# Patient Record
Sex: Male | Born: 1960 | Race: White | Hispanic: No | Marital: Married | Smoking: Never smoker
Health system: Southern US, Community
[De-identification: ages and names within clinical notes are randomized; demographics above are authoritative.]

## PROBLEM LIST (undated history)

## (undated) DIAGNOSIS — E66811 Obesity, class 1: Secondary | ICD-10-CM

## (undated) DIAGNOSIS — E6609 Other obesity due to excess calories: Secondary | ICD-10-CM

## (undated) DIAGNOSIS — I10 Essential (primary) hypertension: Secondary | ICD-10-CM

## (undated) DIAGNOSIS — M4640 Discitis, unspecified, site unspecified: Secondary | ICD-10-CM

---

## 2014-04-23 ENCOUNTER — Other Ambulatory Visit (HOSPITAL_COMMUNITY): Payer: Self-pay | Admitting: Radiology

## 2014-04-23 DIAGNOSIS — G4730 Sleep apnea, unspecified: Secondary | ICD-10-CM

## 2014-04-25 ENCOUNTER — Ambulatory Visit: Payer: BLUE CROSS/BLUE SHIELD | Attending: Family Medicine | Admitting: Sleep Medicine

## 2014-04-25 DIAGNOSIS — G4733 Obstructive sleep apnea (adult) (pediatric): Secondary | ICD-10-CM | POA: Diagnosis not present

## 2014-04-25 DIAGNOSIS — R0683 Snoring: Secondary | ICD-10-CM | POA: Diagnosis present

## 2014-04-25 DIAGNOSIS — Z6841 Body Mass Index (BMI) 40.0 and over, adult: Secondary | ICD-10-CM | POA: Insufficient documentation

## 2014-04-25 DIAGNOSIS — E669 Obesity, unspecified: Secondary | ICD-10-CM | POA: Insufficient documentation

## 2014-04-25 DIAGNOSIS — G4730 Sleep apnea, unspecified: Secondary | ICD-10-CM

## 2014-04-30 NOTE — Sleep Study (Signed)
  [facility] Odella Appelhans A. Gerilyn Pilgrimoonquah, MD     [email or web address]        NOCTURNAL POLYSOMNOGRAM    LOCATION: SLEEP LAB FACILITY: [place]   PHYSICIAN: Jerrye Seebeck A. Gerilyn Pilgrimoonquah, M.D.   DATE OF STUDY: 04/21/2014.   REFERRING PHYSICIAN: P Sasser.   INDICATIONS: The patient is a 54 year old presents with snoring and obesity.  MEDICATIONS:  Prior to Admission medications   Not on File      EPWORTH SLEEPINESS SCALE: 0.   BMI: 41.   ARCHITECTURAL SUMMARY: Total recording time was 375 minutes. Sleep efficiency 45 %. Sleep latency 9 minutes. REM latency 247 minutes. Stage NI 34 %, N2 58 % and N3 0 % and REM sleep 7 %.    RESPIRATORY DATA:  Baseline oxygen saturation is 96 %. The lowest saturation is 80 %. The diagnostic AHI is 53. Positive pressure treatment was attempted but the patient categorically refused.  LIMB MOVEMENT SUMMARY: PLM index 0.   ELECTROCARDIOGRAM SUMMARY: Average heart rate is 84 with no significant dysrhythmias observed.   IMPRESSION:  1. Severe obstructive sleep apnea syndrome. The patient refused positive pressure treatment. Formal titration recording is suggested however once the patient has been counseled.  Thanks for this referral.  Raenette Sakata A. Gerilyn Pilgrimoonquah, M.D. Diplomat, Biomedical engineerAmerican Board of Sleep Medicine.

## 2021-05-08 ENCOUNTER — Emergency Department (HOSPITAL_COMMUNITY)
Admission: EM | Admit: 2021-05-08 | Discharge: 2021-05-08 | Disposition: A | Discharge status: Home / Self Care | Payer: BC Managed Care – PPO | Attending: Emergency Medicine | Admitting: Emergency Medicine

## 2021-05-08 ENCOUNTER — Emergency Department (HOSPITAL_COMMUNITY): Payer: BC Managed Care – PPO

## 2021-05-08 ENCOUNTER — Encounter (HOSPITAL_COMMUNITY): Payer: Self-pay | Admitting: *Deleted

## 2021-05-08 DIAGNOSIS — M545 Low back pain, unspecified: Secondary | ICD-10-CM | POA: Insufficient documentation

## 2021-05-08 DIAGNOSIS — K8020 Calculus of gallbladder without cholecystitis without obstruction: Secondary | ICD-10-CM | POA: Insufficient documentation

## 2021-05-08 DIAGNOSIS — Z7982 Long term (current) use of aspirin: Secondary | ICD-10-CM | POA: Diagnosis not present

## 2021-05-08 DIAGNOSIS — Z20822 Contact with and (suspected) exposure to covid-19: Secondary | ICD-10-CM | POA: Diagnosis not present

## 2021-05-08 DIAGNOSIS — E119 Type 2 diabetes mellitus without complications: Secondary | ICD-10-CM | POA: Diagnosis not present

## 2021-05-08 DIAGNOSIS — E860 Dehydration: Secondary | ICD-10-CM | POA: Insufficient documentation

## 2021-05-08 DIAGNOSIS — R0602 Shortness of breath: Secondary | ICD-10-CM | POA: Insufficient documentation

## 2021-05-08 DIAGNOSIS — Z79899 Other long term (current) drug therapy: Secondary | ICD-10-CM | POA: Diagnosis not present

## 2021-05-08 DIAGNOSIS — I10 Essential (primary) hypertension: Secondary | ICD-10-CM | POA: Insufficient documentation

## 2021-05-08 DIAGNOSIS — M549 Dorsalgia, unspecified: Secondary | ICD-10-CM | POA: Diagnosis present

## 2021-05-08 LAB — URINALYSIS, ROUTINE W REFLEX MICROSCOPIC
Bilirubin Urine: NEGATIVE
Glucose, UA: NEGATIVE mg/dL
Ketones, ur: 5 mg/dL — AB
Leukocytes,Ua: NEGATIVE
Nitrite: NEGATIVE
Protein, ur: 30 mg/dL — AB
Specific Gravity, Urine: 1.024 (ref 1.005–1.030)
pH: 5 (ref 5.0–8.0)

## 2021-05-08 LAB — CBC WITH DIFFERENTIAL/PLATELET
Abs Immature Granulocytes: 0.07 10*3/uL (ref 0.00–0.07)
Basophils Absolute: 0.1 10*3/uL (ref 0.0–0.1)
Basophils Relative: 1 %
Eosinophils Absolute: 0.1 10*3/uL (ref 0.0–0.5)
Eosinophils Relative: 1 %
HCT: 47.6 % (ref 39.0–52.0)
Hemoglobin: 15.5 g/dL (ref 13.0–17.0)
Immature Granulocytes: 1 %
Lymphocytes Relative: 15 %
Lymphs Abs: 1.5 10*3/uL (ref 0.7–4.0)
MCH: 29.8 pg (ref 26.0–34.0)
MCHC: 32.6 g/dL (ref 30.0–36.0)
MCV: 91.4 fL (ref 80.0–100.0)
Monocytes Absolute: 1.4 10*3/uL — ABNORMAL HIGH (ref 0.1–1.0)
Monocytes Relative: 13 %
Neutro Abs: 7.2 10*3/uL (ref 1.7–7.7)
Neutrophils Relative %: 69 %
Platelets: 231 10*3/uL (ref 150–400)
RBC: 5.21 MIL/uL (ref 4.22–5.81)
RDW: 13.8 % (ref 11.5–15.5)
WBC: 10.3 10*3/uL (ref 4.0–10.5)
nRBC: 0 % (ref 0.0–0.2)

## 2021-05-08 LAB — COMPREHENSIVE METABOLIC PANEL
ALT: 39 U/L (ref 0–44)
AST: 29 U/L (ref 15–41)
Albumin: 3.3 g/dL — ABNORMAL LOW (ref 3.5–5.0)
Alkaline Phosphatase: 75 U/L (ref 38–126)
Anion gap: 9 (ref 5–15)
BUN: 14 mg/dL (ref 6–20)
CO2: 22 mmol/L (ref 22–32)
Calcium: 8.5 mg/dL — ABNORMAL LOW (ref 8.9–10.3)
Chloride: 103 mmol/L (ref 98–111)
Creatinine, Ser: 1.18 mg/dL (ref 0.61–1.24)
GFR, Estimated: >60 mL/min (ref >60)
Glucose, Bld: 114 mg/dL — ABNORMAL HIGH (ref 70–99)
Potassium: 3.8 mmol/L (ref 3.5–5.1)
Sodium: 134 mmol/L — ABNORMAL LOW (ref 135–145)
Total Bilirubin: 0.7 mg/dL (ref 0.3–1.2)
Total Protein: 7.5 g/dL (ref 6.5–8.1)

## 2021-05-08 LAB — RESP PANEL BY RT-PCR (FLU A&B, COVID) ARPGX2
Influenza A by PCR: NEGATIVE
Influenza B by PCR: NEGATIVE
SARS Coronavirus 2 by RT PCR: NEGATIVE

## 2021-05-08 LAB — LIPASE, BLOOD: Lipase: 39 U/L (ref 11–51)

## 2021-05-08 IMAGING — CT CT RENAL STONE PROTOCOL
2 of 4 series · 16 of 46 positions shown, 18 images · non-contrast
Comparison: By report from [DATE]

CLINICAL DATA: Right-sided flank pain for several days, initial
encounter



[Series 2: axial st · axial · 0.98mm/px · z∈[-970,-490]mm · 13 of 112 slices shown, 15 images]
[im 8/112  soft-tissue]
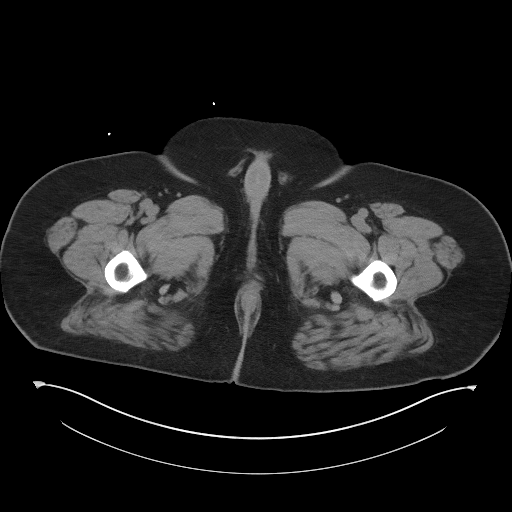
[im 8/112  bone]
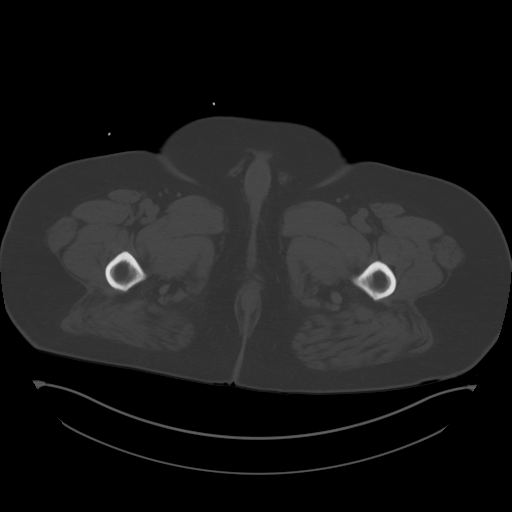
[im 15/112  soft-tissue]
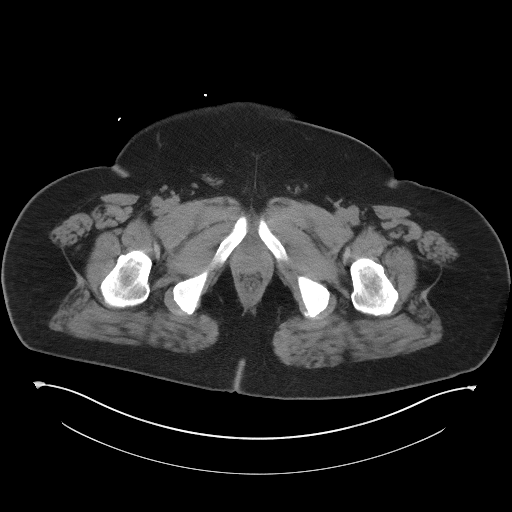
[im 23/112  soft-tissue]
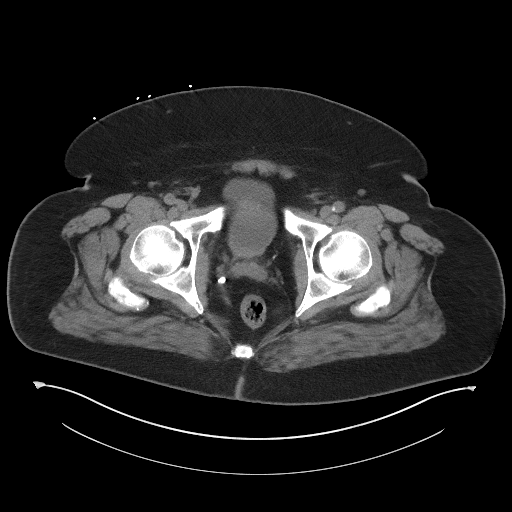
[im 30/112  soft-tissue]
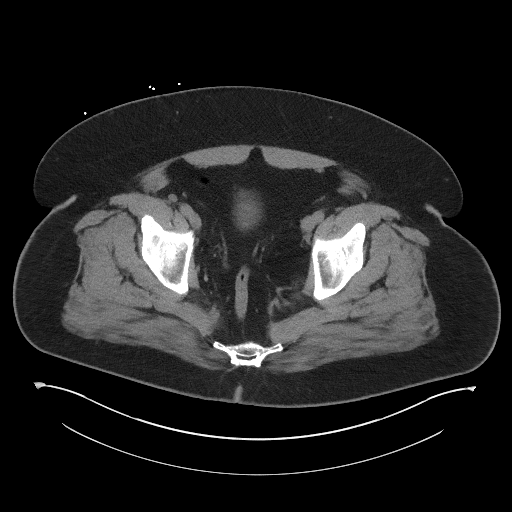
[im 38/112  soft-tissue]
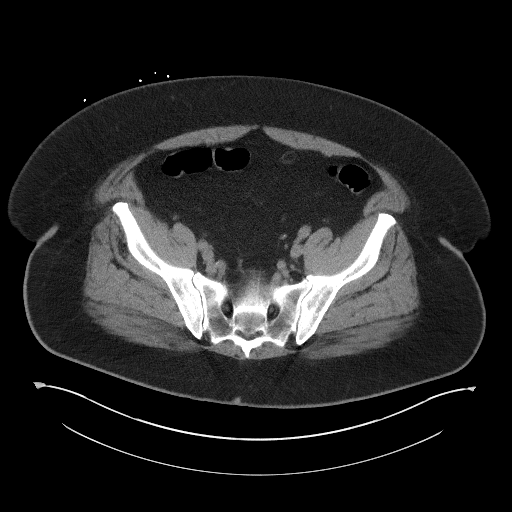
[im 45/112  soft-tissue]
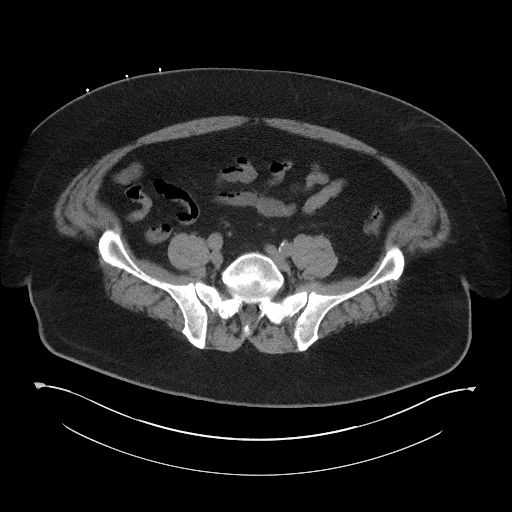
[im 60/112  soft-tissue]
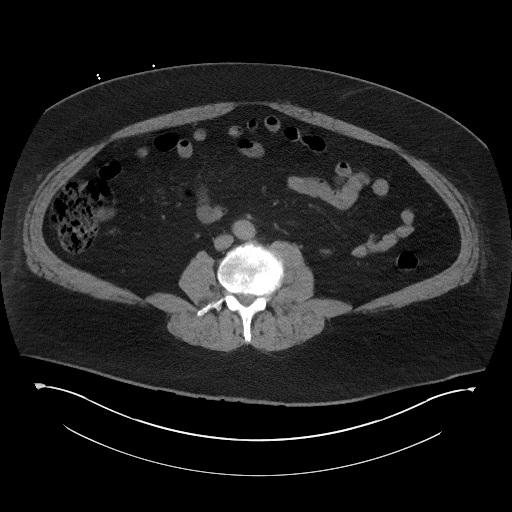
[im 67/112  soft-tissue]
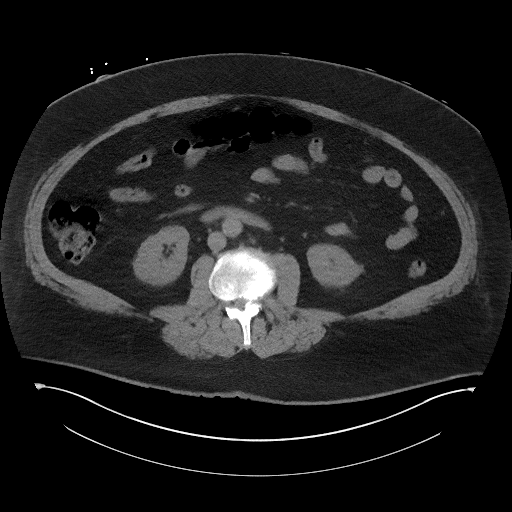
[im 75/112  soft-tissue]
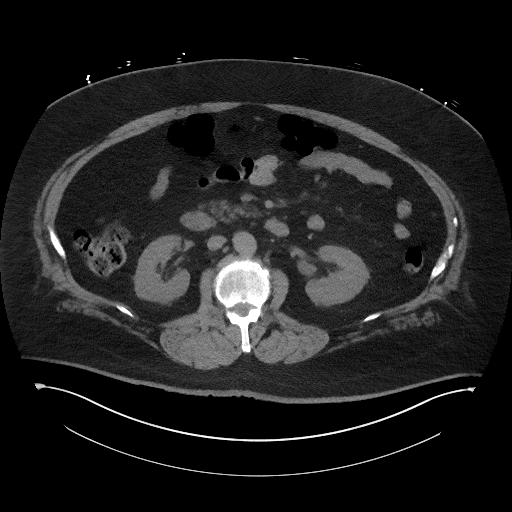
[im 75/112  bone]
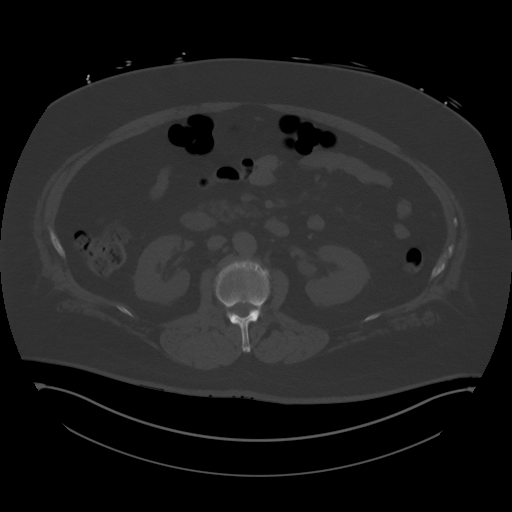
[im 82/112  soft-tissue]
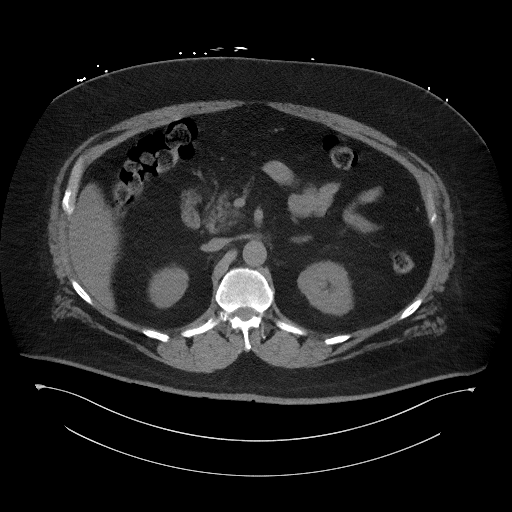
[im 89/112  soft-tissue]
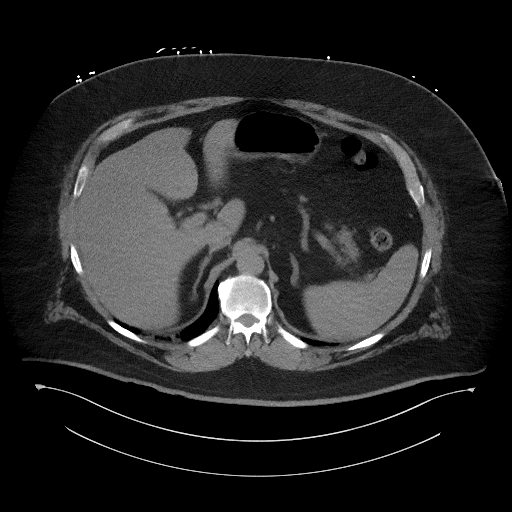
[im 97/112  soft-tissue]
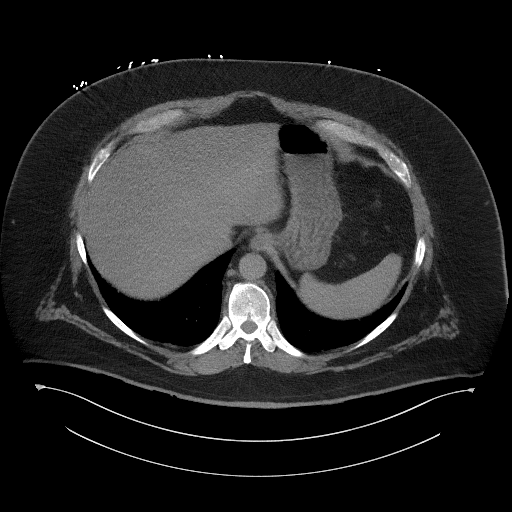
[im 104/112  soft-tissue]
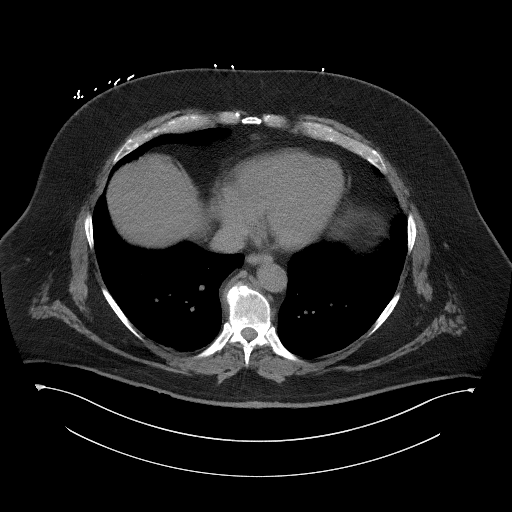

[Series 5: coronal st · coronal · 1.04mm/px · 3 of 125 slices shown]
[im 42/125  soft-tissue]
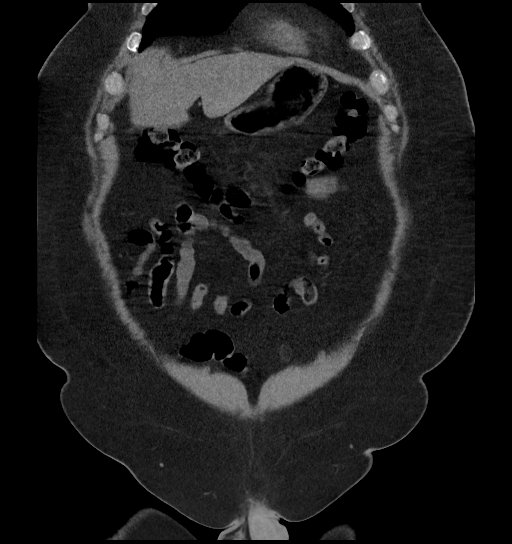
[im 56/125  soft-tissue]
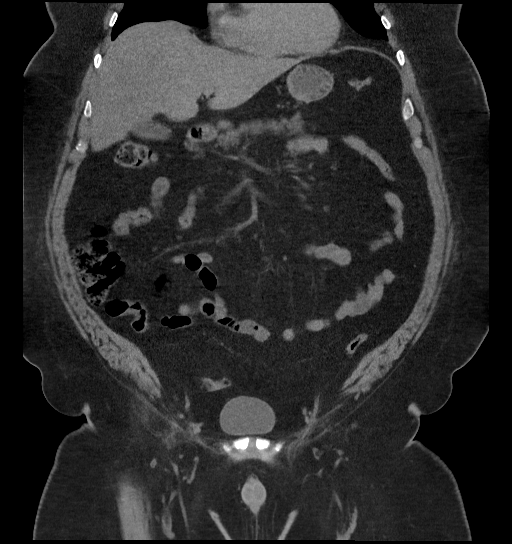
[im 69/125  soft-tissue]
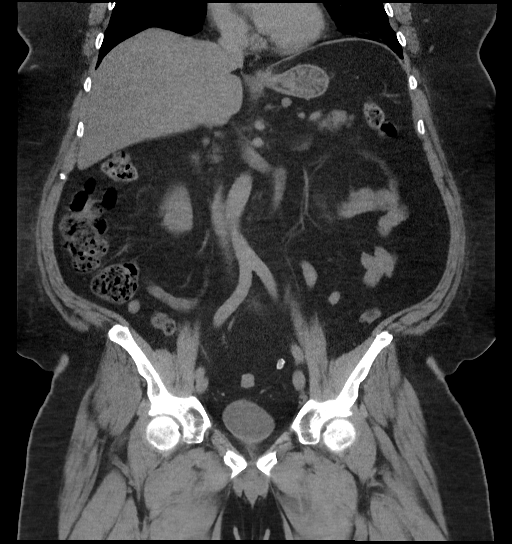

[16 of 46 positions shown; findings below may reference images not displayed]

FINDINGS: Lower chest: Minimal atelectatic changes are noted. No focal
confluent infiltrate is seen.

Hepatobiliary: Gallbladder is well distended with dependent
gallstones. Mild fatty infiltration of the liver is noted.

Pancreas: Unremarkable. No pancreatic ductal dilatation or
surrounding inflammatory changes.

Spleen: Normal in size without focal abnormality.

Adrenals/Urinary Tract: Adrenal glands show a small 12 mm
hypodensity within the right adrenal gland. Left adrenal gland is
within normal limits. Kidneys are well visualize without renal
calculi or obstructive changes. The bladder is partially distended.

Stomach/Bowel: No obstructive or inflammatory changes of the colon
are seen. The appendix is within normal limits. Small bowel and
stomach are unremarkable.

Vascular/Lymphatic: Aortic atherosclerosis. No enlarged abdominal or
pelvic lymph nodes.

Reproductive: Prostate is unremarkable.

Other: No abdominal wall hernia or abnormality. No abdominopelvic
ascites.

Musculoskeletal: No acute or significant osseous findings.
IMPRESSION: 12 mm hypodense nodule within the right adrenal gland. By report
this was present on a prior CT from [DATE]. Given its stability
it is felt to represent a benign adenoma.

Cholelithiasis without complicating factors.

Fatty infiltration of the liver.

## 2021-05-08 MED ORDER — HYDROCODONE-ACETAMINOPHEN 5-325 MG PO TABS: 2.0000 | ORAL_TABLET | ORAL | 0 refills | Status: DC | PRN

## 2021-05-08 MED ORDER — SODIUM CHLORIDE 0.9 % IV SOLN: INTRAVENOUS | Status: DC

## 2021-05-08 MED ORDER — CYCLOBENZAPRINE HCL 10 MG PO TABS
10.0000 mg | ORAL_TABLET | Freq: Three times a day (TID) | ORAL | 0 refills | Status: DC | PRN
Start: 2021-05-08 — End: 2021-05-21

## 2021-05-08 MED ORDER — ONDANSETRON 4 MG PO TBDP: 4.0000 mg | ORAL_TABLET | Freq: Three times a day (TID) | ORAL | 1 refills | Status: DC | PRN

## 2021-05-08 MED ORDER — SODIUM CHLORIDE 0.9 % IV BOLUS
500.0000 mL | Freq: Once | INTRAVENOUS | Status: AC
  Administered 2021-05-08: 18:00:00 500 mL via INTRAVENOUS

## 2021-05-08 NOTE — ED Notes (Signed)
Pt was given a urinal at this time to obatain a urine sample. Gailya Tauer ? ? ?

## 2021-05-08 NOTE — ED Provider Notes (Addendum)
[facility] EMERGENCY DEPARTMENT Provider Note   CSN: [id number] Arrival date & time: 05/08/21  1614     History  Chief Complaint  Patient presents with   dehydrated    Michael [name] is a 61 y.o. male.  Patient sent in by his primary care doctor who is [facility].  For concerns for dehydration.  Patient has been vomiting over a week.  But its only been once or twice a day.  Has not vomited at all today.  Patient says has been drinking plenty of fluids.  It is associated with some right-sided back pain that is made worse with movement feels he strained his back no radiation of pain down into his legs.  No diarrhea.  No fevers.  No dysuria no hematuria.  Patient is accompanied by a family member.  Patient denies any chest pain any abdominal pain any upper respiratory symptoms.  Any shortness of breath.  Patient's past medical history based on his labs would be significant for diabetes and hypertension and patient is on Xarelto.  Patient is not able to tell me why.  Patient is on Lasix as well but does not appear to be on a calcium channel blocker or a beta-blocker.      Home Medications Prior to Admission medications   Medication Sig Start Date End Date Taking? Authorizing Provider  Aspirin-Salicylamide-Caffeine (BC HEADACHE POWDER PO) Take by mouth.   Yes [provider]  naproxen sodium (ALEVE) 220 MG tablet Take 220 mg by mouth daily as needed (pain).   Yes [provider]  olmesartan (BENICAR) 20 MG tablet Take 20 mg by mouth daily. 03/13/21  Yes [provider]  ondansetron (ZOFRAN) 4 MG tablet Take by mouth. 05/06/21  Yes [provider]  triamterene-hydrochlorothiazide (MAXZIDE-25) 37.5-25 MG tablet Take 1 tablet by mouth daily. 04/24/21  Yes [provider]      Allergies    Patient has no known allergies.    Review of Systems   Review of Systems  Constitutional:  Negative for chills and fever.  HENT:  Negative for  ear pain and sore throat.   Eyes:  Negative for pain and visual disturbance.  Respiratory:  Negative for cough and shortness of breath.   Cardiovascular:  Negative for chest pain and palpitations.  Gastrointestinal:  Positive for nausea and vomiting. Negative for abdominal pain.  Genitourinary:  Negative for dysuria and hematuria.  Musculoskeletal:  Positive for back pain. Negative for arthralgias.  Skin:  Negative for color change and rash.  Neurological:  Negative for seizures and syncope.  All other systems reviewed and are negative.  Physical Exam Updated Vital Signs BP (!) 152/91    Pulse 93    Temp 98.3 F (36.8 C)    Resp 14    Ht 1.829 m (6')    Wt (!) 149.7 kg    SpO2 100%    BMI 44.76 kg/m  Physical Exam Vitals and nursing note reviewed.  Constitutional:      General: He is not in acute distress.    Appearance: He is well-developed. He is not ill-appearing.  HENT:     Head: Normocephalic and atraumatic.     Mouth/Throat:     Mouth: Mucous membranes are moist.     Comments: Dukas membranes are moist.  Surprising based on the history. Eyes:     Conjunctiva/sclera: Conjunctivae normal.  Cardiovascular:     Rate and Rhythm: Normal rate and regular rhythm.  Heart sounds: No murmur heard. Pulmonary:     Effort: Pulmonary effort is normal. No respiratory distress.     Breath sounds: Normal breath sounds. No wheezing or rales.  Abdominal:     Palpations: Abdomen is soft.     Tenderness: There is no abdominal tenderness.  Musculoskeletal:        General: No swelling.     Cervical back: Normal range of motion and neck supple. No rigidity.     Right lower leg: No edema.     Left lower leg: No edema.     Comments: Some tenderness to palpation of the right side of the back.  Skin:    General: Skin is warm and dry.     Capillary Refill: Capillary refill takes less than 2 seconds.  Neurological:     General: No focal deficit present.     Mental Status: He is alert and  oriented to person, place, and time.  Psychiatric:        Mood and Affect: Mood normal.    ED Results / Procedures / Treatments   Labs (all labs ordered are listed, but only abnormal results are displayed) Labs Reviewed  COMPREHENSIVE METABOLIC PANEL - Abnormal; Notable for the following components:      Result Value   Sodium 134 (*)    Glucose, Bld 114 (*)    Calcium 8.5 (*)    Albumin 3.3 (*)    All other components within normal limits  CBC WITH DIFFERENTIAL/PLATELET - Abnormal; Notable for the following components:   Monocytes Absolute 1.4 (*)    All other components within normal limits  URINALYSIS, ROUTINE W REFLEX MICROSCOPIC - Abnormal; Notable for the following components:   Color, Urine AMBER (*)    Hgb urine dipstick SMALL (*)    Ketones, ur 5 (*)    Protein, ur 30 (*)    Bacteria, UA RARE (*)    All other components within normal limits  RESP PANEL BY RT-PCR (FLU A&B, COVID) ARPGX2  LIPASE, BLOOD    EKG EKG Interpretation  Date/Time:  Thursday May 08 2021 16:58:11 EST Ventricular Rate:  100 PR Interval:  128 QRS Duration: 94 QT Interval:  337 QTC Calculation: 435 R Axis:   43 Text Interpretation: Sinus tachycardia Low voltage, precordial leads RSR' in V1 or V2, right VCD or RVH Baseline wander in lead(s) II aVF V2 V6 No previous ECGs available Confirmed by Vanetta Mulders [phone]) on 05/08/2021 5:06:16 PM  Radiology CT Renal Stone Study  Result Date: 05/08/2021 CLINICAL DATA:  Right-sided flank pain for several days, initial encounter EXAM: CT ABDOMEN AND PELVIS WITHOUT CONTRAST TECHNIQUE: Multidetector CT imaging of the abdomen and pelvis was performed following the standard protocol without IV contrast. RADIATION DOSE REDUCTION: This exam was performed according to the departmental dose-optimization program which includes automated exposure control, adjustment of the mA and/or kV according to patient size and/or use of iterative reconstruction technique.  COMPARISON:  By report from 12/17/2017 FINDINGS: Lower chest: Minimal atelectatic changes are noted. No focal confluent infiltrate is seen. Hepatobiliary: Gallbladder is well distended with dependent gallstones. Mild fatty infiltration of the liver is noted. Pancreas: Unremarkable. No pancreatic ductal dilatation or surrounding inflammatory changes. Spleen: Normal in size without focal abnormality. Adrenals/Urinary Tract: Adrenal glands show a small 12 mm hypodensity within the right adrenal gland. Left adrenal gland is within normal limits. Kidneys are well visualize without renal calculi or obstructive changes. The bladder is partially distended. Stomach/Bowel: No obstructive or  inflammatory changes of the colon are seen. The appendix is within normal limits. Small bowel and stomach are unremarkable. Vascular/Lymphatic: Aortic atherosclerosis. No enlarged abdominal or pelvic lymph nodes. Reproductive: Prostate is unremarkable. Other: No abdominal wall hernia or abnormality. No abdominopelvic ascites. Musculoskeletal: No acute or significant osseous findings. IMPRESSION: 12 mm hypodense nodule within the right adrenal gland. By report this was present on a prior CT from 12/17/2017. Given its stability it is felt to represent a benign adenoma. Cholelithiasis without complicating factors. Fatty infiltration of the liver. Electronically Signed   By: Alcide CleverMark  Lukens M.D.   On: 05/08/2021 19:35    Procedures Procedures    Medications Ordered in ED Medications  0.9 %  sodium chloride infusion ( Intravenous New Bag/Given 05/08/21 1857)  sodium chloride 0.9 % bolus 500 mL (0 mLs Intravenous Stopped 05/08/21 1944)    ED Course/ Medical Decision Making/ A&P                           Medical Decision Making Amount and/or Complexity of Data Reviewed Labs: ordered. Radiology: ordered.  Risk Prescription drug management.   Clinically the patient did not appear dehydrated.  But patient's primary care physician at  sentiment for that concern.  So patient given IV fluids here.  Urinalysis negative but urine is somewhat hemoconcentrated.  COVID influenza is negative.  Electrolytes complete metabolic panel sodium down a little bit at 134 glucose good at 114 CO2 normal at 22 BUN and creatinine is normal kidney functions normal.  LFTs are normal.  Lipase is not elevated.  CBC no leukocytosis hemoglobin 15.5.  CT renal study was done because of the pain being mostly on the right side.  There is a 12 mm hypodense nodule within the right adrenal gland but it is unchanged from 2019 so no work-up needed.  There is cholelithiasis without complicating factors.  Patient really did not have any pain in the anterior part of the abdomen.  Patient did receive 500 cc bolus and 100 cc an hour while waiting for results.  Patient's labs has discussed the not consistent at all with any significant dehydration.  But the urine was a little concentrated.  But he was well-hydrated here.  Certainly renal function was completely normal.  However you cannot completely rule out that this may have been an atypical presentation of biliary colic.  But LFTs are normal and there is no leukocytosis and certainly there has not been any obstruction for any long period of time.  Patient can follow back up with his primary care doctor regarding this.  We will treat the back pain with a prepack of hydrocodone Flexeril and Zofran for the nausea vomiting.  We will have patient follow back up with primary care provider keeping in mind there is evidence of gallstones as well although I do not think there is any signs or concerns for long biliary colic or acute cholecystitis.  However it is possible he could be having some biliary colic symptoms.   Final Clinical Impression(s) / ED Diagnoses Final diagnoses:  Acute right-sided low back pain without sciatica  Calculus of gallbladder without cholecystitis without obstruction    Rx / DC Orders ED  Discharge Orders     None         Vanetta MuldersZackowski, Tierra Thoma, MD 05/08/21 2138    Vanetta MuldersZackowski, Carnesha Maravilla, MD 05/08/21 2142

## 2021-05-08 NOTE — ED Notes (Signed)
Pt given water and crackers  

## 2021-05-08 NOTE — ED Triage Notes (Signed)
States he was sent here by PCP for dehydration, vomiting for over a week ?

## 2021-05-08 NOTE — ED Notes (Signed)
Patient transported to CT 

## 2021-05-08 NOTE — Discharge Instructions (Addendum)
Make an appointment to follow-up with your primary care doctor.  Take the Flexeril as needed for the back pain.  Also supplement with hydrocodone as needed for worse pain.  Take the Zofran for nausea vomiting. ? ?There also was findings of gallstones in your gallbladder.  Not clear if that could be causing some of your symptoms.  But no signs of any complications at this time.  Make an appointment to follow-up with your primary care doctor regarding this.  And also referral information provided to general surgery can make an appointment follow-up with them. ? ?Work note provided. ?

## 2021-05-09 MED FILL — Hydrocodone-Acetaminophen Tab 5-325 MG: ORAL | Qty: 6 | Status: AC

## 2021-05-16 ENCOUNTER — Encounter (HOSPITAL_COMMUNITY): Payer: Self-pay | Admitting: *Deleted

## 2021-05-16 ENCOUNTER — Inpatient Hospital Stay (HOSPITAL_COMMUNITY)
Admission: EM | Admit: 2021-05-16 | Discharge: 2021-05-21 | DRG: 540 | Disposition: A | Discharge status: Home Health | Payer: BC Managed Care – PPO | Attending: Internal Medicine | Admitting: Internal Medicine

## 2021-05-16 ENCOUNTER — Other Ambulatory Visit: Payer: Self-pay

## 2021-05-16 ENCOUNTER — Emergency Department (HOSPITAL_COMMUNITY): Payer: BC Managed Care – PPO

## 2021-05-16 DIAGNOSIS — K760 Fatty (change of) liver, not elsewhere classified: Secondary | ICD-10-CM | POA: Diagnosis present

## 2021-05-16 DIAGNOSIS — Z20822 Contact with and (suspected) exposure to covid-19: Secondary | ICD-10-CM | POA: Diagnosis present

## 2021-05-16 DIAGNOSIS — E669 Obesity, unspecified: Secondary | ICD-10-CM | POA: Diagnosis not present

## 2021-05-16 DIAGNOSIS — M4644 Discitis, unspecified, thoracic region: Principal | ICD-10-CM | POA: Diagnosis present

## 2021-05-16 DIAGNOSIS — Z6841 Body Mass Index (BMI) 40.0 and over, adult: Secondary | ICD-10-CM

## 2021-05-16 DIAGNOSIS — R7881 Bacteremia: Secondary | ICD-10-CM | POA: Diagnosis present

## 2021-05-16 DIAGNOSIS — B9689 Other specified bacterial agents as the cause of diseases classified elsewhere: Secondary | ICD-10-CM | POA: Diagnosis present

## 2021-05-16 DIAGNOSIS — K5900 Constipation, unspecified: Secondary | ICD-10-CM | POA: Diagnosis present

## 2021-05-16 DIAGNOSIS — K219 Gastro-esophageal reflux disease without esophagitis: Secondary | ICD-10-CM | POA: Diagnosis present

## 2021-05-16 DIAGNOSIS — M4624 Osteomyelitis of vertebra, thoracic region: Principal | ICD-10-CM | POA: Diagnosis present

## 2021-05-16 DIAGNOSIS — M62838 Other muscle spasm: Secondary | ICD-10-CM | POA: Diagnosis present

## 2021-05-16 DIAGNOSIS — K0889 Other specified disorders of teeth and supporting structures: Secondary | ICD-10-CM | POA: Diagnosis present

## 2021-05-16 DIAGNOSIS — R269 Unspecified abnormalities of gait and mobility: Secondary | ICD-10-CM | POA: Diagnosis present

## 2021-05-16 DIAGNOSIS — M8618 Other acute osteomyelitis, other site: Secondary | ICD-10-CM | POA: Diagnosis not present

## 2021-05-16 DIAGNOSIS — Z79899 Other long term (current) drug therapy: Secondary | ICD-10-CM | POA: Diagnosis not present

## 2021-05-16 DIAGNOSIS — R000 Tachycardia, unspecified: Secondary | ICD-10-CM | POA: Diagnosis present

## 2021-05-16 DIAGNOSIS — M8610 Other acute osteomyelitis, unspecified site: Secondary | ICD-10-CM | POA: Diagnosis not present

## 2021-05-16 DIAGNOSIS — R0682 Tachypnea, not elsewhere classified: Secondary | ICD-10-CM | POA: Diagnosis present

## 2021-05-16 DIAGNOSIS — I10 Essential (primary) hypertension: Secondary | ICD-10-CM

## 2021-05-16 DIAGNOSIS — B9620 Unspecified Escherichia coli [E. coli] as the cause of diseases classified elsewhere: Secondary | ICD-10-CM | POA: Diagnosis present

## 2021-05-16 DIAGNOSIS — M869 Osteomyelitis, unspecified: Secondary | ICD-10-CM | POA: Diagnosis present

## 2021-05-16 DIAGNOSIS — K047 Periapical abscess without sinus: Secondary | ICD-10-CM | POA: Diagnosis present

## 2021-05-16 DIAGNOSIS — K8020 Calculus of gallbladder without cholecystitis without obstruction: Secondary | ICD-10-CM | POA: Diagnosis present

## 2021-05-16 LAB — URINALYSIS, ROUTINE W REFLEX MICROSCOPIC
Bacteria, UA: NONE SEEN
Bilirubin Urine: NEGATIVE
Glucose, UA: NEGATIVE mg/dL
Hgb urine dipstick: NEGATIVE
Ketones, ur: 5 mg/dL — AB
Nitrite: NEGATIVE
Protein, ur: NEGATIVE mg/dL
Specific Gravity, Urine: 1.021 (ref 1.005–1.030)
pH: 5 (ref 5.0–8.0)

## 2021-05-16 LAB — RESP PANEL BY RT-PCR (FLU A&B, COVID) ARPGX2
Influenza A by PCR: NEGATIVE
Influenza B by PCR: NEGATIVE
SARS Coronavirus 2 by RT PCR: NEGATIVE

## 2021-05-16 LAB — LACTIC ACID, PLASMA: Lactic Acid, Venous: 1 mmol/L (ref 0.5–1.9)

## 2021-05-16 LAB — CBC WITH DIFFERENTIAL/PLATELET
Abs Immature Granulocytes: 0.2 10*3/uL — ABNORMAL HIGH (ref 0.00–0.07)
Basophils Absolute: 0.1 10*3/uL (ref 0.0–0.1)
Basophils Relative: 0 %
Eosinophils Absolute: 0.1 10*3/uL (ref 0.0–0.5)
Eosinophils Relative: 1 %
HCT: 46.6 % (ref 39.0–52.0)
Hemoglobin: 15 g/dL (ref 13.0–17.0)
Immature Granulocytes: 1 %
Lymphocytes Relative: 6 %
Lymphs Abs: 1.2 10*3/uL (ref 0.7–4.0)
MCH: 29.6 pg (ref 26.0–34.0)
MCHC: 32.2 g/dL (ref 30.0–36.0)
MCV: 92.1 fL (ref 80.0–100.0)
Monocytes Absolute: 1.8 10*3/uL — ABNORMAL HIGH (ref 0.1–1.0)
Monocytes Relative: 9 %
Neutro Abs: 16.2 10*3/uL — ABNORMAL HIGH (ref 1.7–7.7)
Neutrophils Relative %: 83 %
Platelets: 319 10*3/uL (ref 150–400)
RBC: 5.06 MIL/uL (ref 4.22–5.81)
RDW: 14 % (ref 11.5–15.5)
WBC: 19.5 10*3/uL — ABNORMAL HIGH (ref 4.0–10.5)
nRBC: 0 % (ref 0.0–0.2)

## 2021-05-16 LAB — COMPREHENSIVE METABOLIC PANEL
ALT: 43 U/L (ref 0–44)
AST: 26 U/L (ref 15–41)
Albumin: 3.4 g/dL — ABNORMAL LOW (ref 3.5–5.0)
Alkaline Phosphatase: 77 U/L (ref 38–126)
Anion gap: 10 (ref 5–15)
BUN: 12 mg/dL (ref 6–20)
CO2: 25 mmol/L (ref 22–32)
Calcium: 9.1 mg/dL (ref 8.9–10.3)
Chloride: 100 mmol/L (ref 98–111)
Creatinine, Ser: 1.18 mg/dL (ref 0.61–1.24)
GFR, Estimated: >60 mL/min (ref >60)
Glucose, Bld: 114 mg/dL — ABNORMAL HIGH (ref 70–99)
Potassium: 4.3 mmol/L (ref 3.5–5.1)
Sodium: 135 mmol/L (ref 135–145)
Total Bilirubin: 1 mg/dL (ref 0.3–1.2)
Total Protein: 8.2 g/dL — ABNORMAL HIGH (ref 6.5–8.1)

## 2021-05-16 LAB — LIPASE, BLOOD: Lipase: 33 U/L (ref 11–51)

## 2021-05-16 LAB — C-REACTIVE PROTEIN: CRP: 13.7 mg/dL — ABNORMAL HIGH (ref <1.0)

## 2021-05-16 LAB — SEDIMENTATION RATE: Sed Rate: 67 mm/hr — ABNORMAL HIGH (ref 0–16)

## 2021-05-16 IMAGING — MR MR THORACIC SPINE WO/W CM
7 of 9 series · 31 of 48 positions shown · IV contrast (gadavist)
Comparison: CTA chest from same day.

CLINICAL DATA: Intermittent mid to low back pain for the past week.
Concern for thoracic osteomyelitis discitis on CT.

EXAM:
MRI THORACIC WITHOUT AND WITH CONTRAST
TECHNIQUE: Multiplanar and multiecho pulse sequences of the thoracic spine were
obtained without and with intravenous contrast.
CONTRAST:  10mL GADAVIST GADOBUTROL 1 MMOL/ML IV SOLN

[Series 18: T1 · sagittal · 6.0mm · 1.23mm/px · 1 of 8 slices shown (1 of 3)]
[im 1/8]
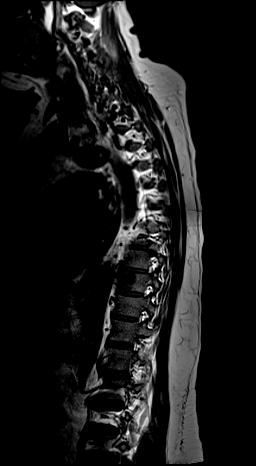

[Series 19: T1 · sagittal · 3.0mm · 1.41mm/px · 3 of 19 slices shown (2 of 3)]
[im 1/19]
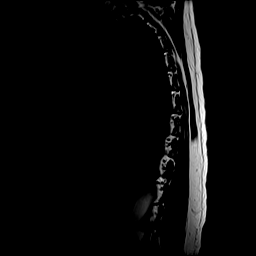
[im 10/19]
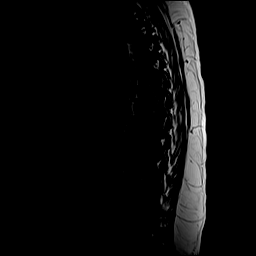
[im 19/19]
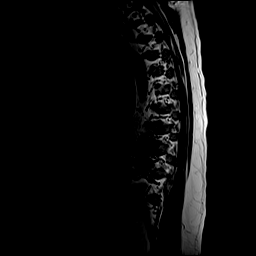

[Series 20: STIR · sagittal · 3.0mm · 0.56mm/px · 3 of 19 slices shown]
[im 1/19]
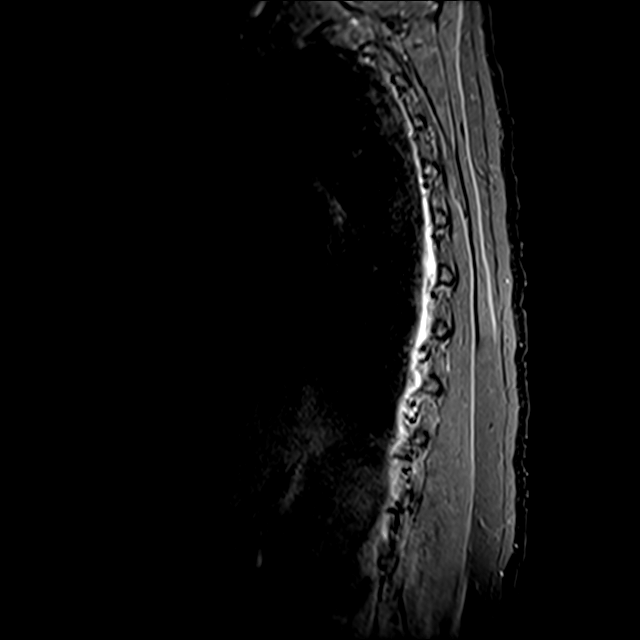
[im 7/19]
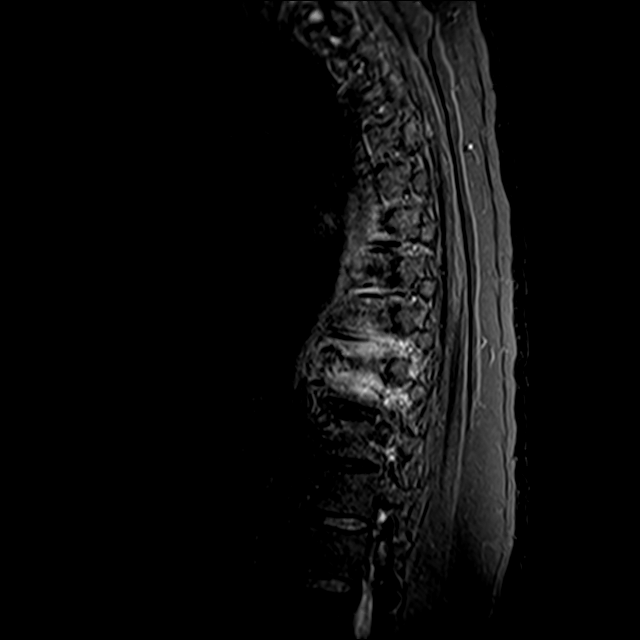
[im 13/19]
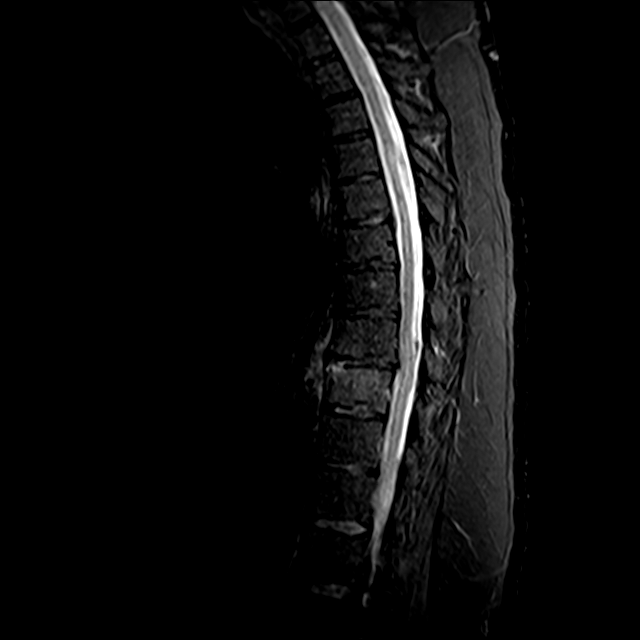

[Series 22: T2 · axial · 5.0mm · 0.74mm/px · z∈[-282,-13]mm · 8 of 41 slices shown (1 of 2)]
[im 1/41]
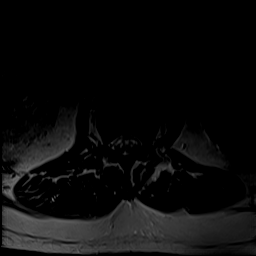
[im 6/41]
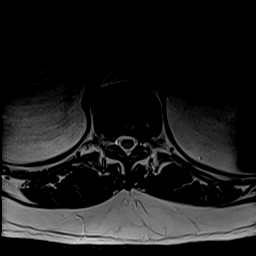
[im 12/41]
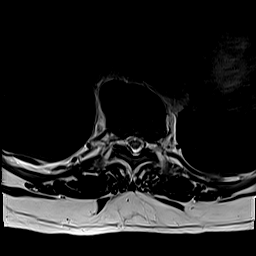
[im 18/41]
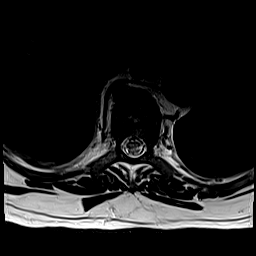
[im 23/41]
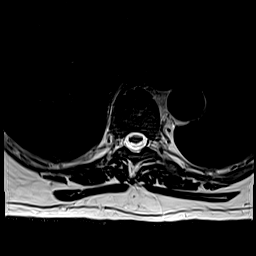
[im 29/41]
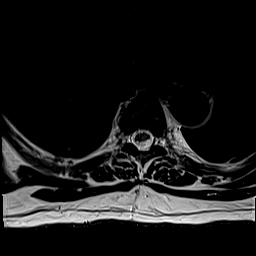
[im 35/41]
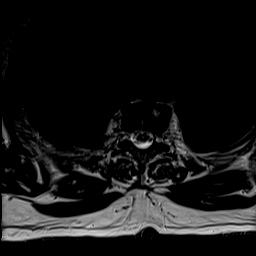
[im 41/41]
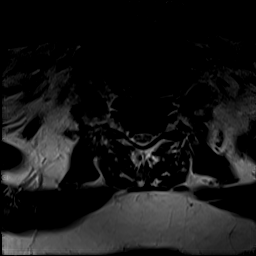

[Series 23: T1 · axial · non-contrast · 5.0mm · 0.37mm/px · z∈[-282,-13]mm · 8 of 41 slices shown (3 of 3)]
[im 1/41]
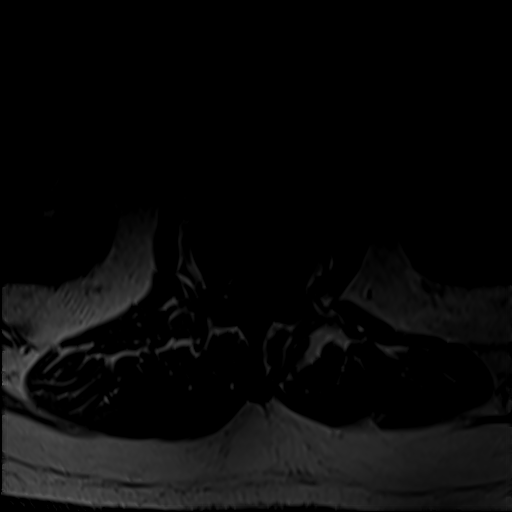
[im 6/41]
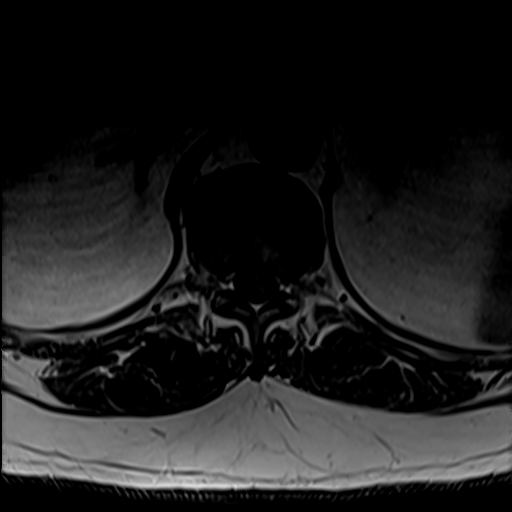
[im 12/41]
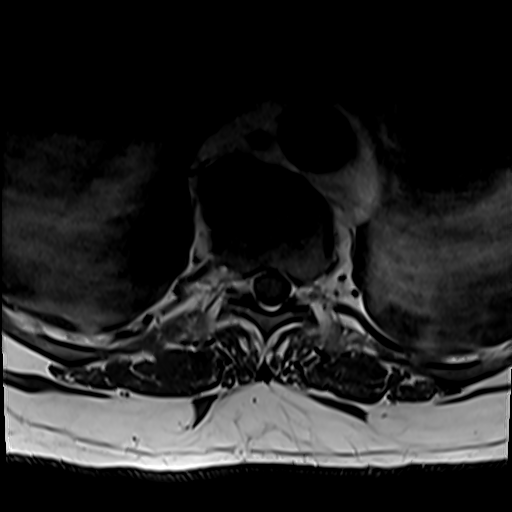
[im 18/41]
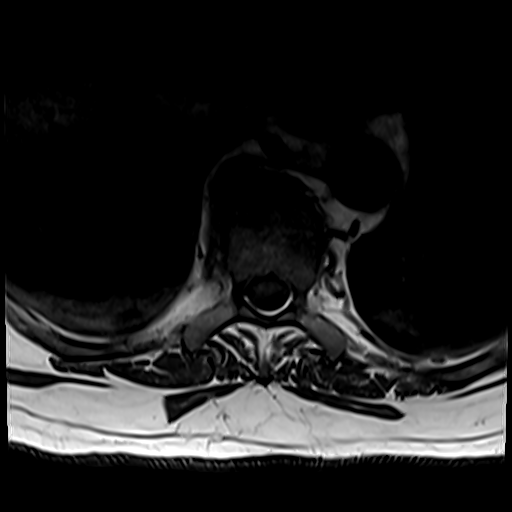
[im 23/41]
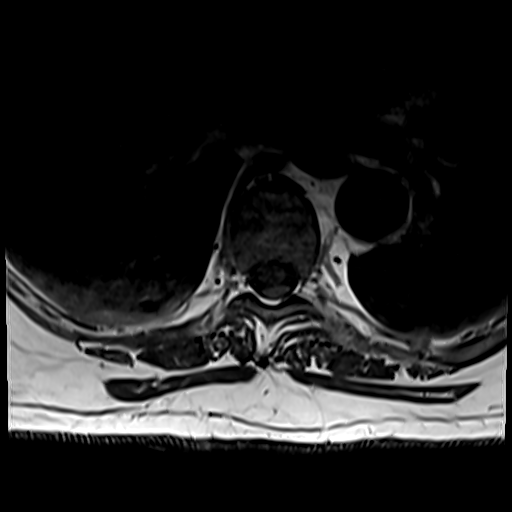
[im 29/41]
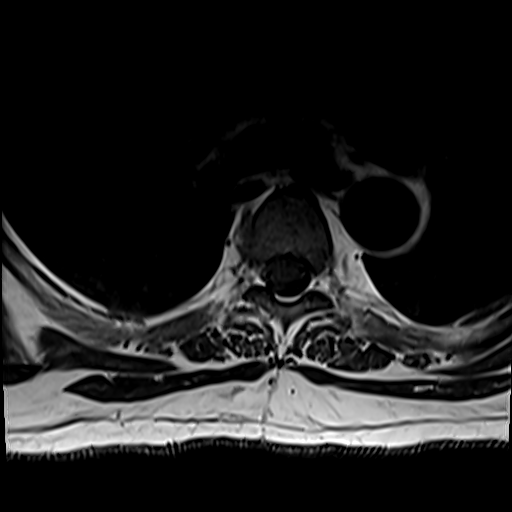
[im 35/41]
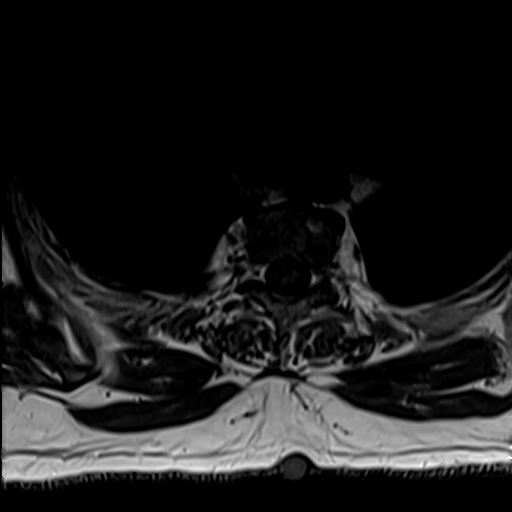
[im 41/41]
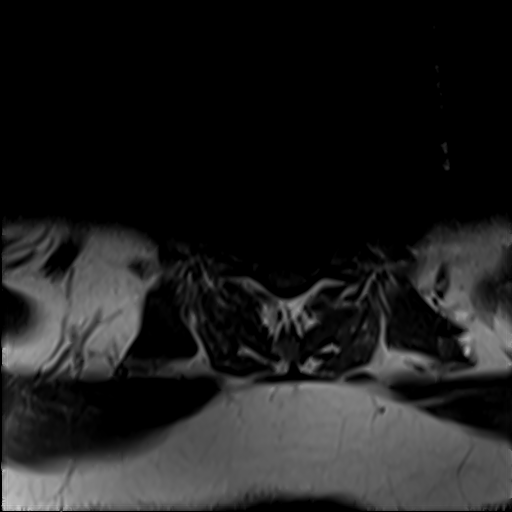

[Series 24: T2 · sagittal · 3.0mm · 1.06mm/px · 4 of 19 slices shown (2 of 2)]
[im 1/19]
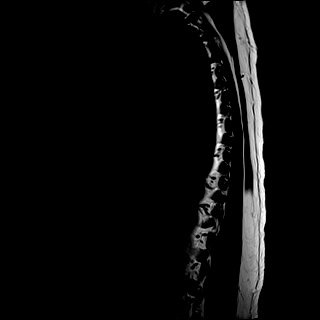
[im 7/19]
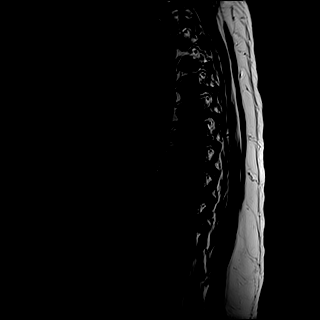
[im 13/19]
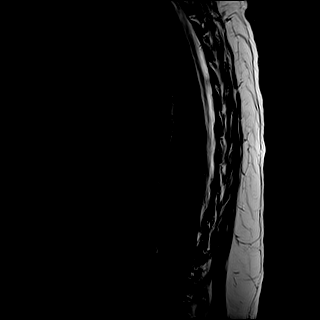
[im 19/19]
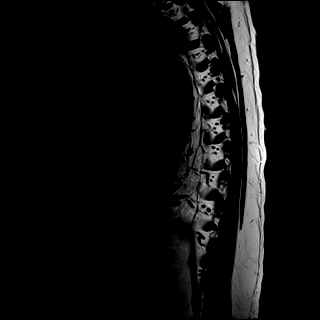

[Series 25: T1 fat-sat post-contrast · sagittal · 3.0mm · 1.06mm/px · 4 of 19 slices shown]
[im 1/19]
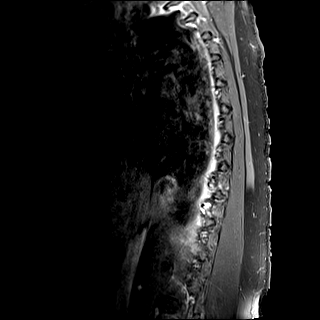
[im 7/19]
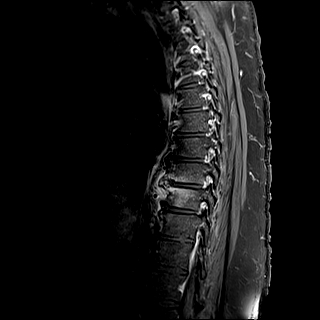
[im 13/19]
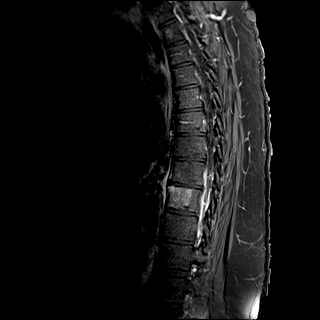
[im 19/19]
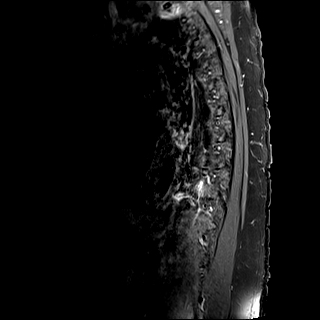

[31 of 48 positions shown; findings below may reference images not displayed]

FINDINGS: Alignment:  Physiologic.

Vertebrae: T8-T9 endplate marrow edema and enhancement anteriorly.
Small amount of fluid in the disc anteriorly (series 20, image 12).
Thin circumferential epidural enhancement at T8 and T9. No fracture
or bone lesion.

Cord:  Normal signal and morphology.

Paraspinal and other soft tissues: Paravertebral edema and
enhancement at T8-T9.

Disc levels:

Few scattered small disc protrusions. Mild spinal canal stenosis at
T8-T9. No neuroforaminal stenosis at any level.
IMPRESSION: 1. T8-T9 osteomyelitis discitis with mild epidural and paravertebral
inflammation. No abscess.

## 2021-05-16 IMAGING — CT CT ABD-PELV W/ CM
2 of 5 series · 15 of 46 positions shown, 17 images · IV contrast (agent unspecified)
Comparison: CT abdomen pelvis dated [DATE].

CLINICAL DATA: Intermittent mid to low back pain for the past week.



[Series 5: abd/pel w/ · axial · 0.98mm/px · z∈[+778,+1253]mm · 12 of 111 slices shown, 14 images]
[im 8/111  soft-tissue]
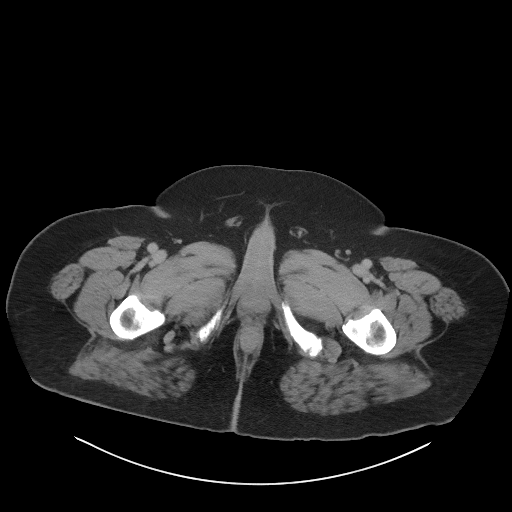
[im 8/111  bone]
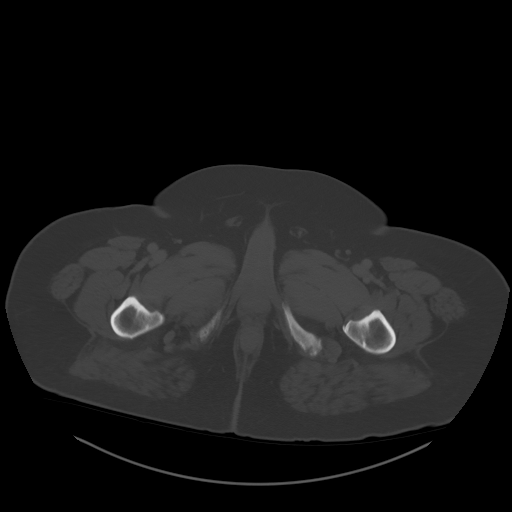
[im 15/111  soft-tissue]
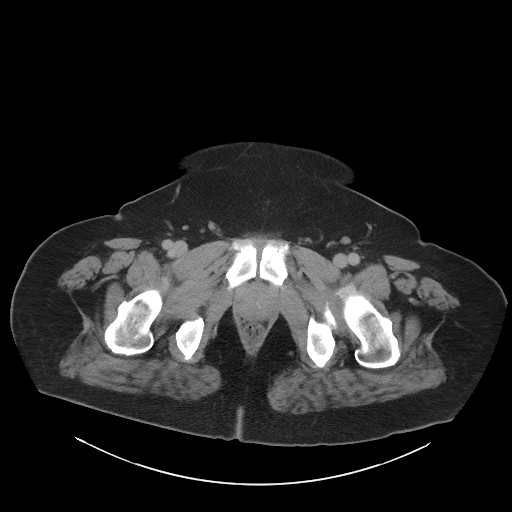
[im 23/111  soft-tissue]
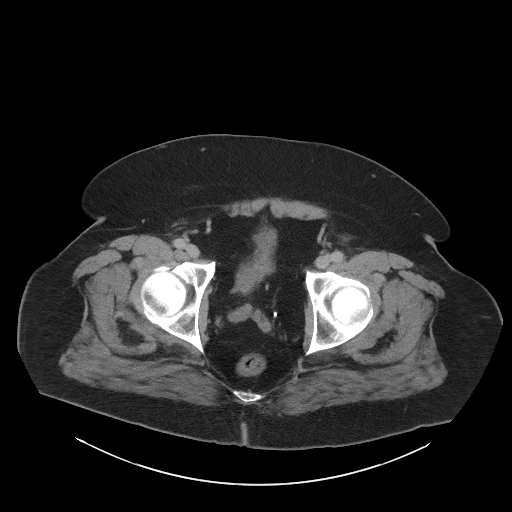
[im 37/111  soft-tissue]
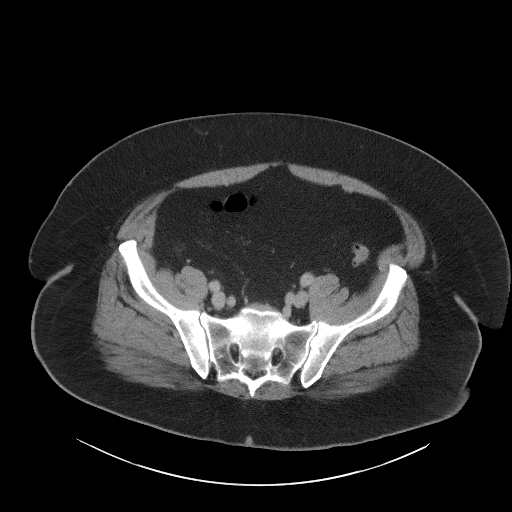
[im 45/111  soft-tissue]
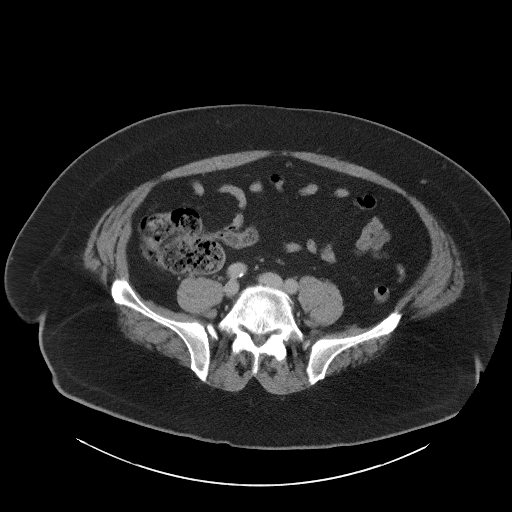
[im 52/111  soft-tissue]
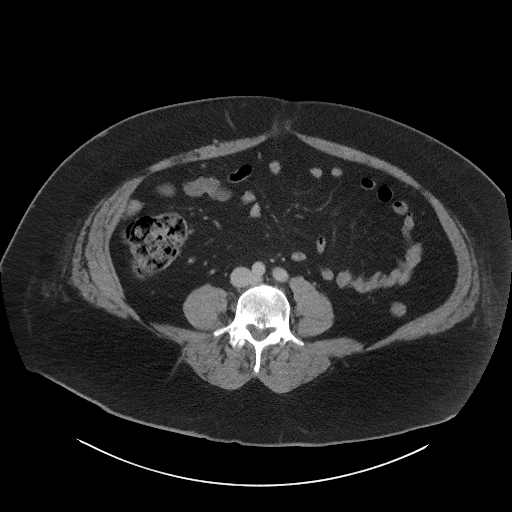
[im 59/111  soft-tissue]
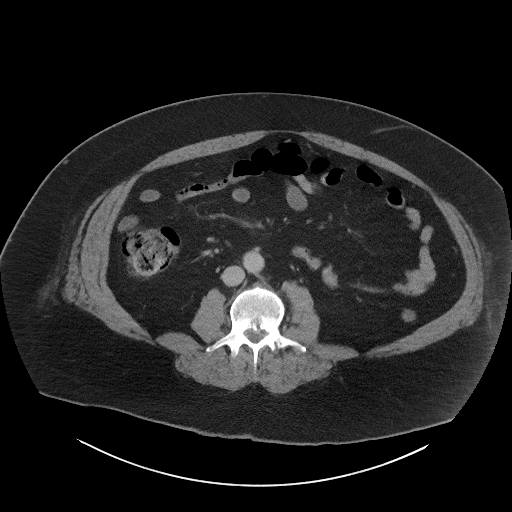
[im 67/111  soft-tissue]
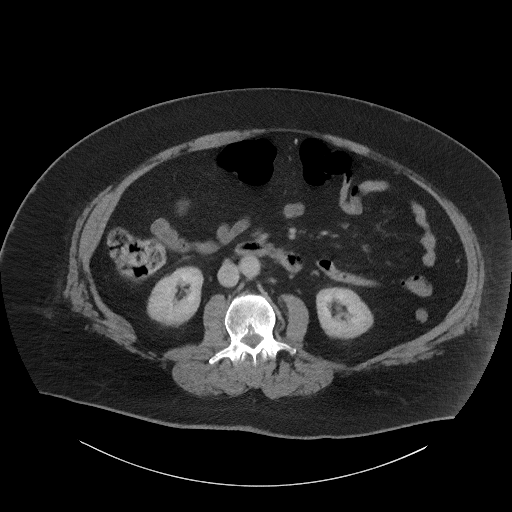
[im 74/111  soft-tissue]
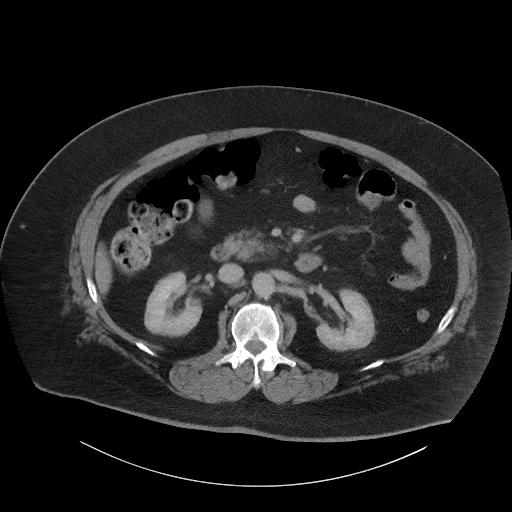
[im 74/111  bone]
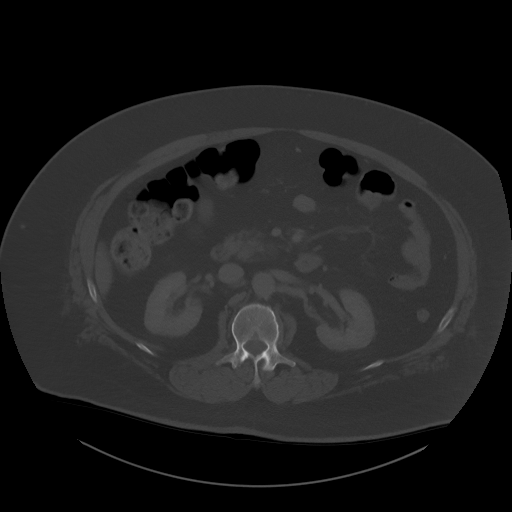
[im 89/111  soft-tissue]
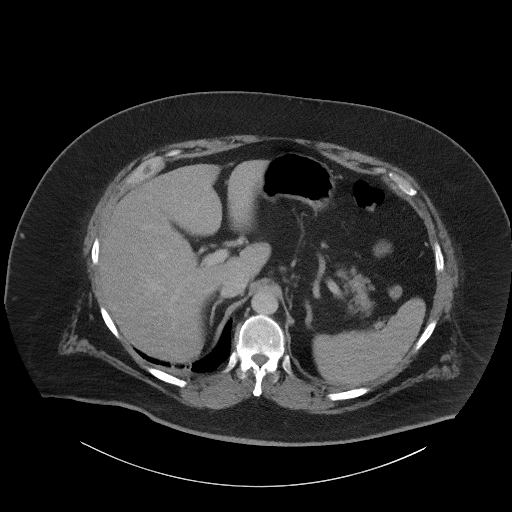
[im 96/111  soft-tissue]
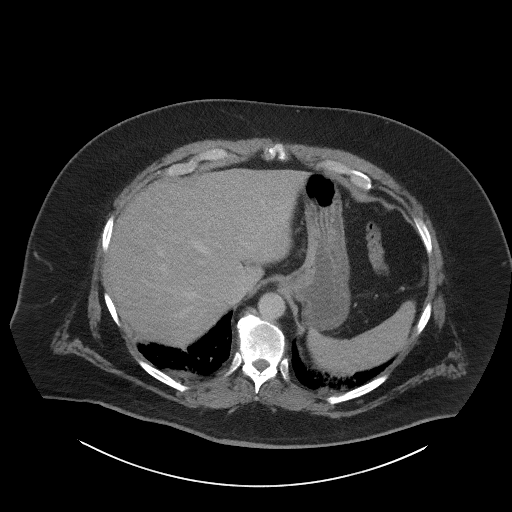
[im 103/111  soft-tissue]
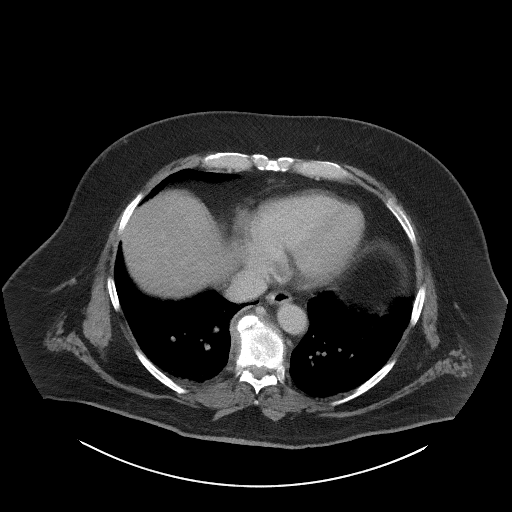

[Series 7: coronal · coronal · 1.03mm/px · 3 of 125 slices shown]
[im 42/125  soft-tissue]
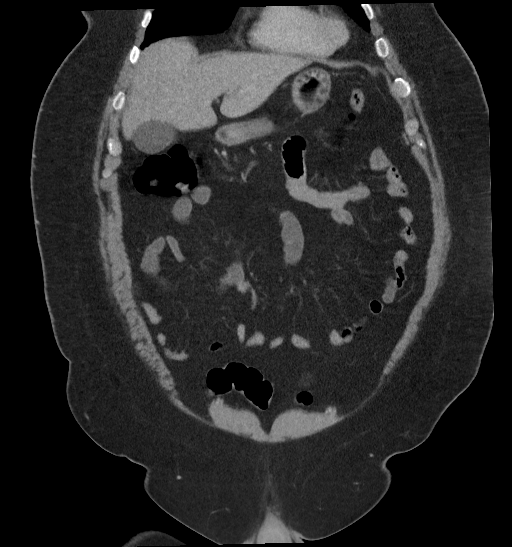
[im 56/125  soft-tissue]
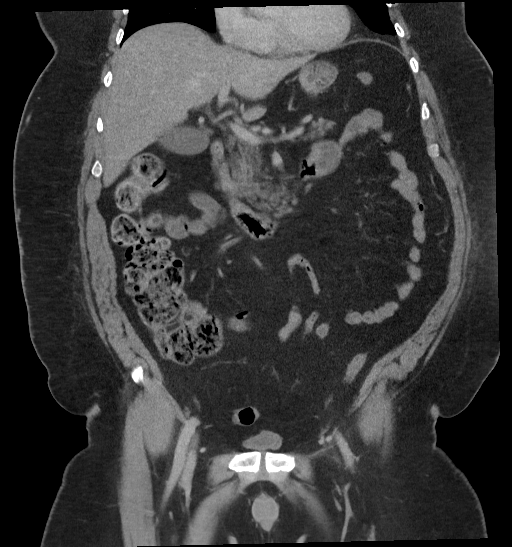
[im 69/125  soft-tissue]
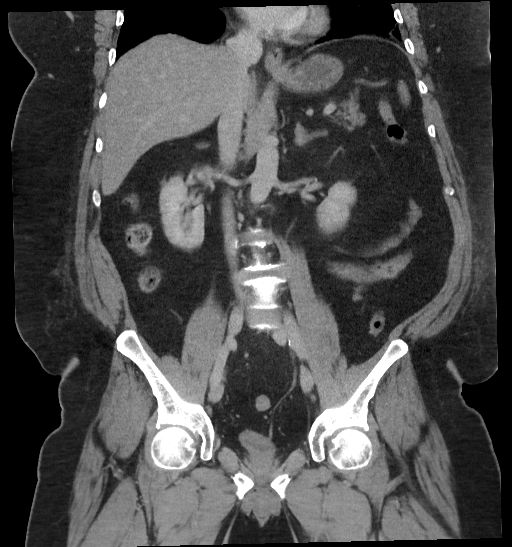

[15 of 46 positions shown; findings below may reference images not displayed]

RADIATION DOSE REDUCTION: This exam was performed according to the
departmental dose-optimization program which includes automated
exposure control, adjustment of the mA and/or kV according to
patient size and/or use of iterative reconstruction technique.

CONTRAST:  100mL OMNIPAQUE IOHEXOL 350 MG/ML SOLN
FINDINGS: CTA CHEST FINDINGS

Cardiovascular: Satisfactory opacification of the pulmonary arteries
to the segmental level. No evidence of pulmonary embolism. Normal
heart size. No pericardial effusion. No thoracic aortic aneurysm or
dissection.

Mediastinum/Nodes: No enlarged mediastinal, hilar, or axillary lymph
nodes. Thyroid gland, trachea, and esophagus demonstrate no
significant findings.

Lungs/Pleura: No focal consolidation, pleural effusion, or
pneumothorax. 4 mm nodule in the right middle lobe (series 5, image
81). Minimal right apical pleuroparenchymal scarring. Minimal
bibasilar subsegmental atelectasis.

Musculoskeletal: New subtle T9 anterior superior endplate
irregularity and lucency (series 7, image 105) with paravertebral
inflammatory change at T8-T9 (series 3, image 72; series 6, image
120).

Review of the MIP images confirms the above findings.

CT ABDOMEN AND PELVIS FINDINGS

Hepatobiliary: No focal liver abnormality. Unchanged tiny
gallstones. No gallbladder wall thickening or biliary dilatation.

Pancreas: Unremarkable. No pancreatic ductal dilatation or
surrounding inflammatory changes.

Spleen: Normal in size without focal abnormality.

Adrenals/Urinary Tract: Unchanged 1.2 cm right adrenal adenoma. No
further imaging follow-up required. Left adrenal gland is
unremarkable. No renal mass, calculi, or hydronephrosis. The bladder
is decompressed.

Stomach/Bowel: Stomach is within normal limits. Appendix appears
normal. No evidence of bowel wall thickening, distention, or
inflammatory changes.

Vascular/Lymphatic: No significant vascular findings are present. No
enlarged abdominal or pelvic lymph nodes.

Reproductive: Prostate is unremarkable.

Other: No free fluid or pneumoperitoneum.

Musculoskeletal: No acute or significant osseous findings.

Review of the MIP images confirms the above findings.
IMPRESSION: Chest:

1. New subtle T9 anterior superior endplate irregularity and lucency
with paravertebral inflammatory change at T8-T9, concerning for
acute osteomyelitis discitis. MRI of the thoracic spine with and
without contrast is recommended for further evaluation.
2. No acute pulmonary embolism.

Abdomen and pelvis:

1. No acute intra-abdominal process.
2. Unchanged cholelithiasis.

## 2021-05-16 IMAGING — CT CT ANGIO CHEST
2 of 7 series · 17 of 46 positions shown · IV contrast (agent unspecified)
Comparison: CT abdomen pelvis dated [DATE].

CLINICAL DATA: Intermittent mid to low back pain for the past week.



[Series 4: pe axialthins · axial · 0.98mm/px · z∈[+1171,+1440]mm · 14 of 379 slices shown]
[im 22/379  lung]
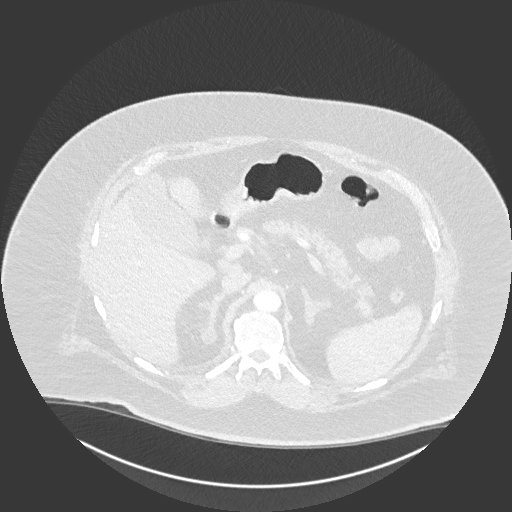
[im 43/379  soft-tissue]
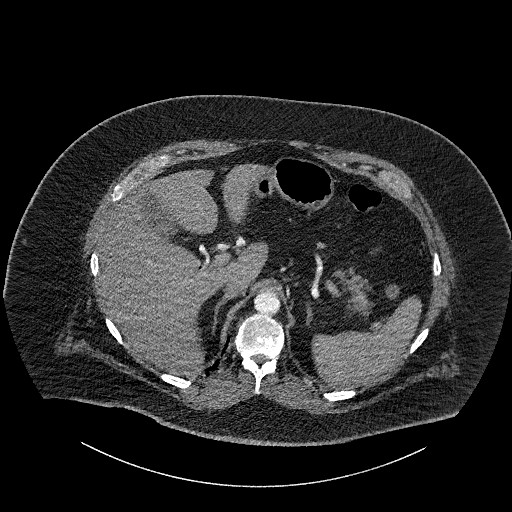
[im 85/379  lung]
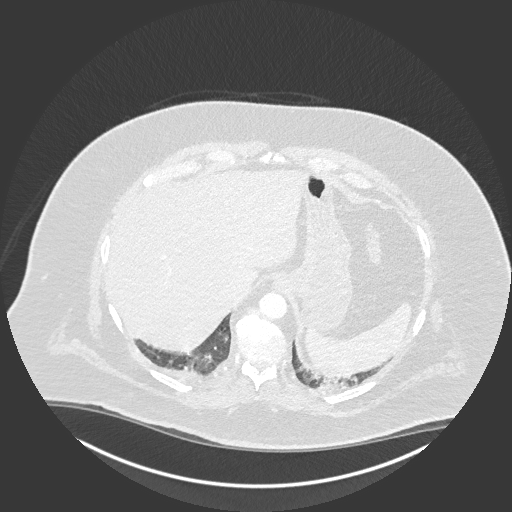
[im 106/379  soft-tissue]
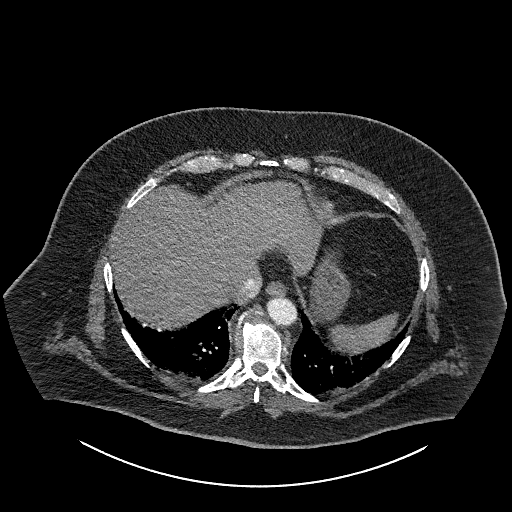
[im 127/379  lung]
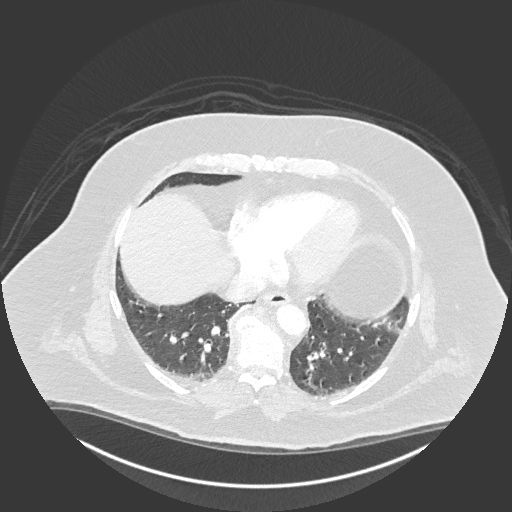
[im 148/379  soft-tissue]
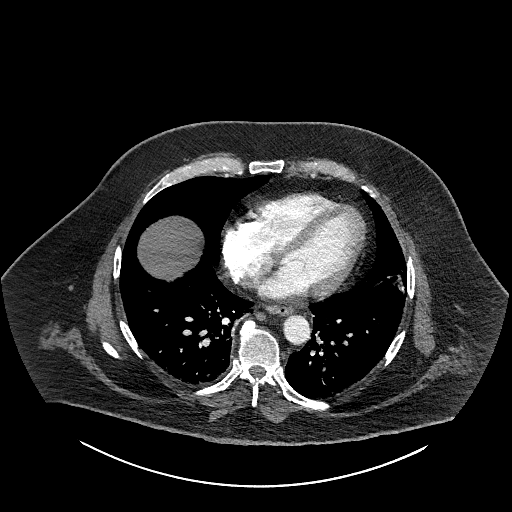
[im 169/379  lung]
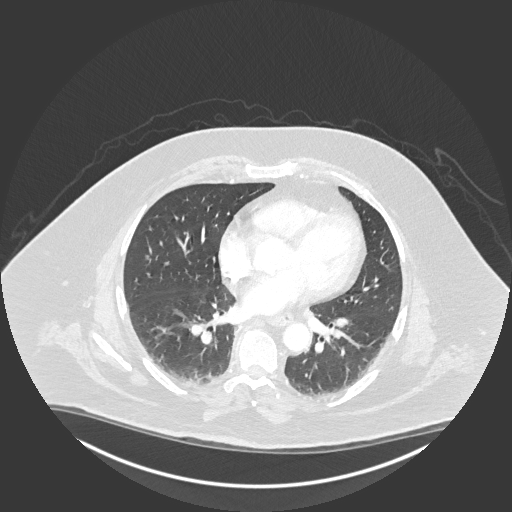
[im 211/379  soft-tissue]
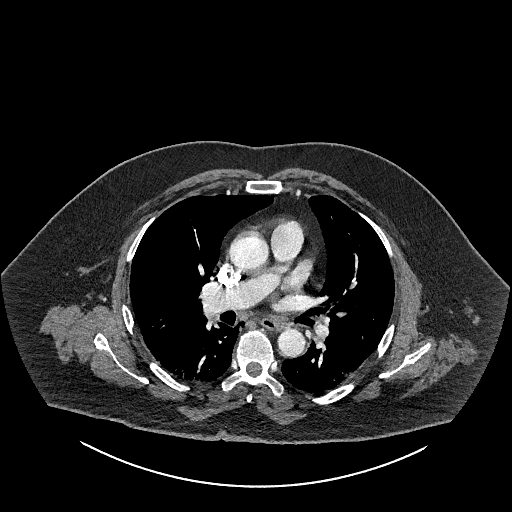
[im 232/379  lung]
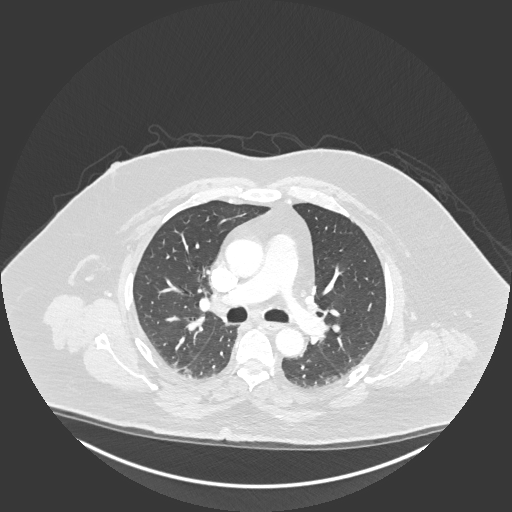
[im 253/379  soft-tissue]
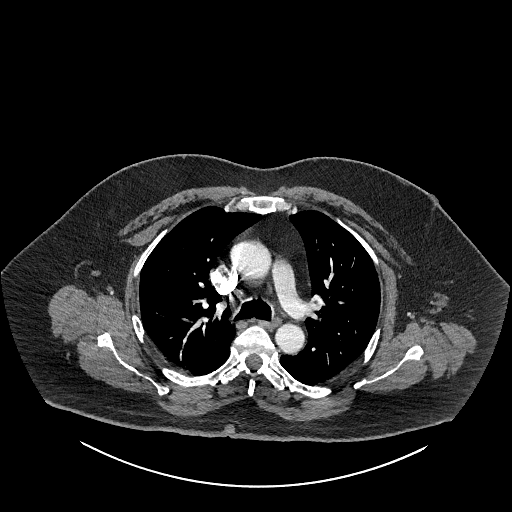
[im 274/379  lung]
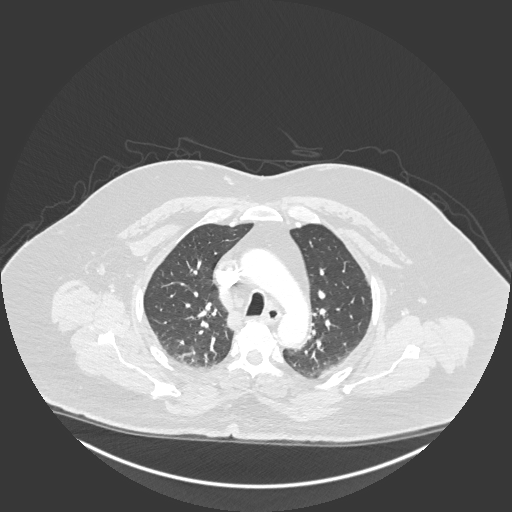
[im 295/379  soft-tissue]
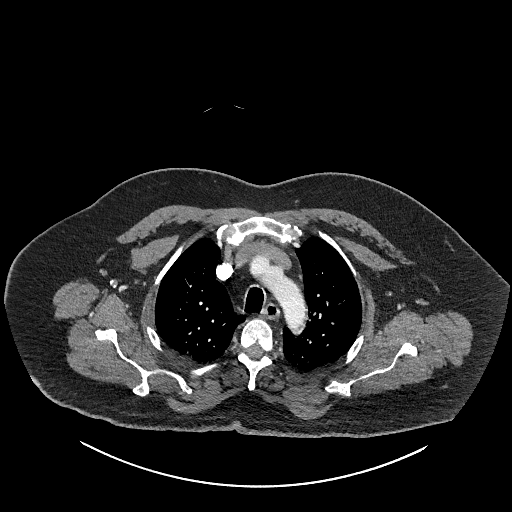
[im 337/379  lung]
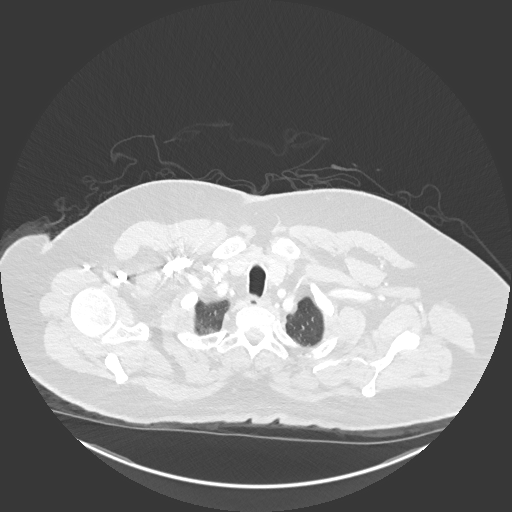
[im 358/379  soft-tissue]
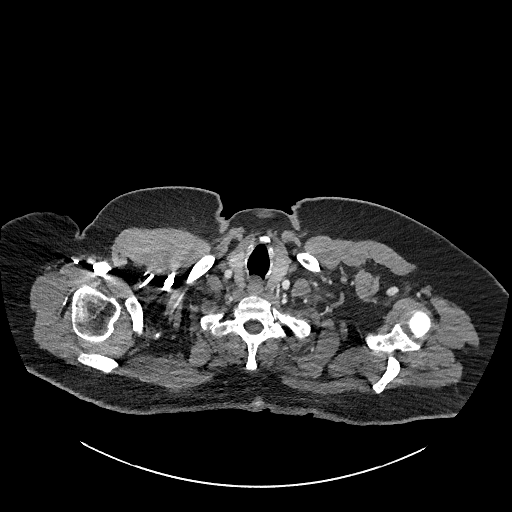

[Series 6: cor soft · coronal · 0.62mm/px · 3 of 174 slices shown]
[im 44/174  soft-tissue]
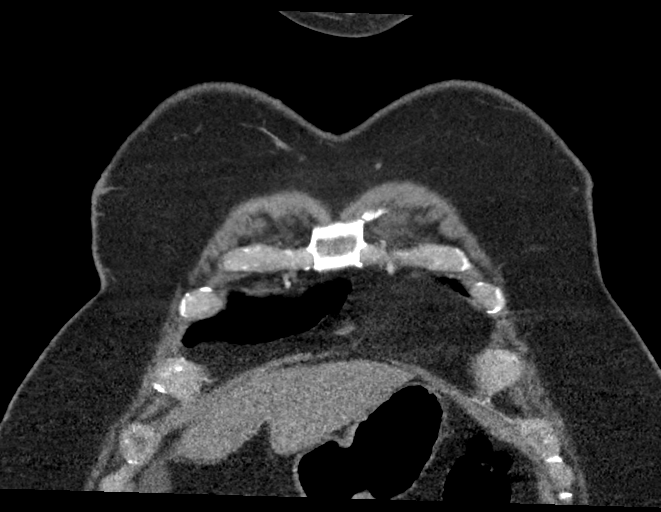
[im 87/174  soft-tissue]
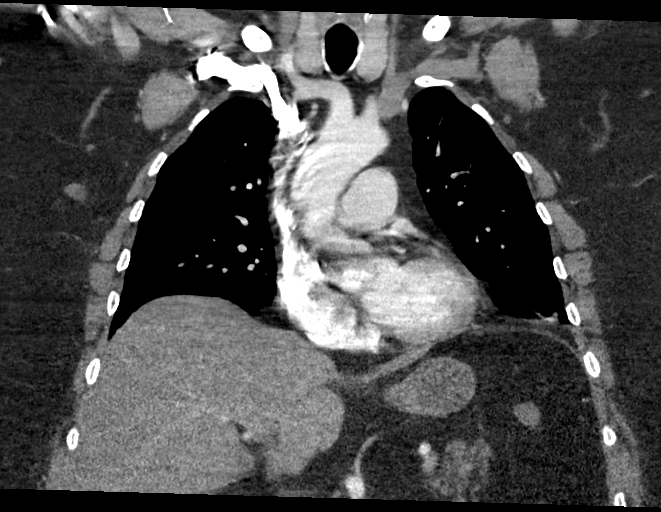
[im 130/174  soft-tissue]
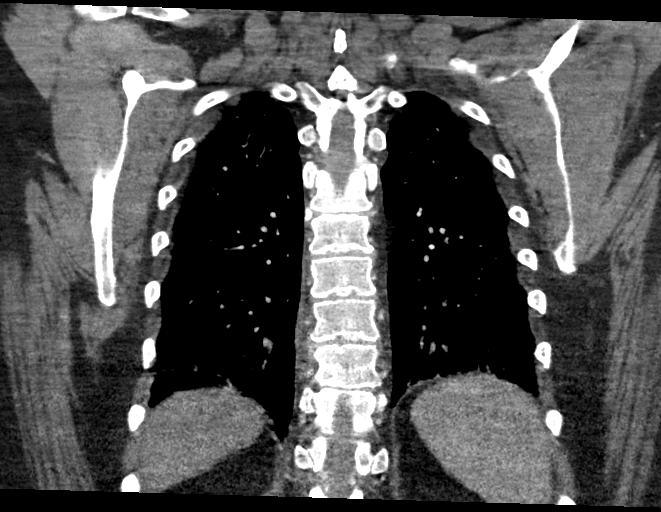

[17 of 46 positions shown; findings below may reference images not displayed]

RADIATION DOSE REDUCTION: This exam was performed according to the
departmental dose-optimization program which includes automated
exposure control, adjustment of the mA and/or kV according to
patient size and/or use of iterative reconstruction technique.

CONTRAST:  100mL OMNIPAQUE IOHEXOL 350 MG/ML SOLN
FINDINGS: CTA CHEST FINDINGS

Cardiovascular: Satisfactory opacification of the pulmonary arteries
to the segmental level. No evidence of pulmonary embolism. Normal
heart size. No pericardial effusion. No thoracic aortic aneurysm or
dissection.

Mediastinum/Nodes: No enlarged mediastinal, hilar, or axillary lymph
nodes. Thyroid gland, trachea, and esophagus demonstrate no
significant findings.

Lungs/Pleura: No focal consolidation, pleural effusion, or
pneumothorax. 4 mm nodule in the right middle lobe (series 5, image
81). Minimal right apical pleuroparenchymal scarring. Minimal
bibasilar subsegmental atelectasis.

Musculoskeletal: New subtle T9 anterior superior endplate
irregularity and lucency (series 7, image 105) with paravertebral
inflammatory change at T8-T9 (series 3, image 72; series 6, image
120).

Review of the MIP images confirms the above findings.

CT ABDOMEN AND PELVIS FINDINGS

Hepatobiliary: No focal liver abnormality. Unchanged tiny
gallstones. No gallbladder wall thickening or biliary dilatation.

Pancreas: Unremarkable. No pancreatic ductal dilatation or
surrounding inflammatory changes.

Spleen: Normal in size without focal abnormality.

Adrenals/Urinary Tract: Unchanged 1.2 cm right adrenal adenoma. No
further imaging follow-up required. Left adrenal gland is
unremarkable. No renal mass, calculi, or hydronephrosis. The bladder
is decompressed.

Stomach/Bowel: Stomach is within normal limits. Appendix appears
normal. No evidence of bowel wall thickening, distention, or
inflammatory changes.

Vascular/Lymphatic: No significant vascular findings are present. No
enlarged abdominal or pelvic lymph nodes.

Reproductive: Prostate is unremarkable.

Other: No free fluid or pneumoperitoneum.

Musculoskeletal: No acute or significant osseous findings.

Review of the MIP images confirms the above findings.
IMPRESSION: Chest:

1. New subtle T9 anterior superior endplate irregularity and lucency
with paravertebral inflammatory change at T8-T9, concerning for
acute osteomyelitis discitis. MRI of the thoracic spine with and
without contrast is recommended for further evaluation.
2. No acute pulmonary embolism.

Abdomen and pelvis:

1. No acute intra-abdominal process.
2. Unchanged cholelithiasis.

## 2021-05-16 MED ORDER — ONDANSETRON HCL 4 MG/2ML IJ SOLN
4.0000 mg | Freq: Once | INTRAMUSCULAR | Status: AC
  Administered 2021-05-16: 4 mg via INTRAVENOUS
  Filled 2021-05-16: qty 2

## 2021-05-16 MED ORDER — TRIAMTERENE-HCTZ 37.5-25 MG PO TABS
1.0000 | ORAL_TABLET | Freq: Every day | ORAL | Status: DC
  Administered 2021-05-17 – 2021-05-21 (×5): 1 via ORAL
  Filled 2021-05-16 (×5): qty 1

## 2021-05-16 MED ORDER — ACETAMINOPHEN 650 MG RE SUPP
650.0000 mg | Freq: Four times a day (QID) | RECTAL | Status: DC | PRN
Start: 2021-05-16 — End: 2021-05-21

## 2021-05-16 MED ORDER — SODIUM CHLORIDE 0.9 % IV SOLN: INTRAVENOUS | Status: DC

## 2021-05-16 MED ORDER — HYDROMORPHONE HCL 1 MG/ML IJ SOLN
1.0000 mg | Freq: Once | INTRAMUSCULAR | Status: AC
  Administered 2021-05-16: 1 mg via INTRAVENOUS
  Filled 2021-05-16: qty 1

## 2021-05-16 MED ORDER — SODIUM CHLORIDE 0.9 % IV SOLN
1.0000 g | Freq: Once | INTRAVENOUS | Status: DC
  Filled 2021-05-16: qty 10

## 2021-05-16 MED ORDER — VANCOMYCIN HCL IN DEXTROSE 1-5 GM/200ML-% IV SOLN
1000.0000 mg | Freq: Once | INTRAVENOUS | Status: DC
Start: 2021-05-16 — End: 2021-05-16

## 2021-05-16 MED ORDER — ACETAMINOPHEN 325 MG PO TABS
650.0000 mg | ORAL_TABLET | Freq: Four times a day (QID) | ORAL | Status: DC | PRN
  Administered 2021-05-17 – 2021-05-18 (×3): 650 mg via ORAL
  Filled 2021-05-16 (×3): qty 2

## 2021-05-16 MED ORDER — ONDANSETRON HCL 4 MG/2ML IJ SOLN
4.0000 mg | Freq: Four times a day (QID) | INTRAMUSCULAR | Status: DC | PRN
  Administered 2021-05-17 – 2021-05-21 (×11): 4 mg via INTRAVENOUS
  Filled 2021-05-16 (×12): qty 2

## 2021-05-16 MED ORDER — SODIUM CHLORIDE 0.9 % IV SOLN
2.0000 g | INTRAVENOUS | Status: DC
  Administered 2021-05-16 – 2021-05-18 (×3): 2 g via INTRAVENOUS
  Filled 2021-05-16 (×3): qty 20

## 2021-05-16 MED ORDER — VANCOMYCIN HCL 10 G IV SOLR
2500.0000 mg | INTRAVENOUS | Status: DC
  Administered 2021-05-17: 2500 mg via INTRAVENOUS
  Filled 2021-05-16: qty 2500
  Filled 2021-05-16: qty 25

## 2021-05-16 MED ORDER — OXYCODONE HCL 5 MG PO TABS
5.0000 mg | ORAL_TABLET | Freq: Four times a day (QID) | ORAL | Status: DC | PRN
  Administered 2021-05-16 – 2021-05-21 (×9): 5 mg via ORAL
  Filled 2021-05-16 (×10): qty 1

## 2021-05-16 MED ORDER — ENOXAPARIN SODIUM 80 MG/0.8ML IJ SOSY
75.0000 mg | PREFILLED_SYRINGE | INTRAMUSCULAR | Status: DC
  Filled 2021-05-16: qty 0.75
  Filled 2021-05-16 (×3): qty 0.8

## 2021-05-16 MED ORDER — IOHEXOL 350 MG/ML SOLN
100.0000 mL | Freq: Once | INTRAVENOUS | Status: AC | PRN
  Administered 2021-05-16: 100 mL via INTRAVENOUS

## 2021-05-16 MED ORDER — PANTOPRAZOLE SODIUM 40 MG PO TBEC
40.0000 mg | DELAYED_RELEASE_TABLET | Freq: Every day | ORAL | Status: DC
  Administered 2021-05-17 – 2021-05-21 (×5): 40 mg via ORAL
  Filled 2021-05-16 (×5): qty 1

## 2021-05-16 MED ORDER — GADOBUTROL 1 MMOL/ML IV SOLN
10.0000 mL | Freq: Once | INTRAVENOUS | Status: AC | PRN
  Administered 2021-05-16: 10 mL via INTRAVENOUS

## 2021-05-16 MED ORDER — SODIUM CHLORIDE 0.9 % IV BOLUS
1000.0000 mL | Freq: Once | INTRAVENOUS | Status: AC
  Administered 2021-05-16: 1000 mL via INTRAVENOUS

## 2021-05-16 MED ORDER — HYDROMORPHONE HCL 1 MG/ML IJ SOLN
0.5000 mg | INTRAMUSCULAR | Status: DC | PRN
  Administered 2021-05-16 – 2021-05-19 (×7): 0.5 mg via INTRAVENOUS
  Filled 2021-05-16 (×3): qty 0.5
  Filled 2021-05-16: qty 1
  Filled 2021-05-16 (×3): qty 0.5

## 2021-05-16 MED ORDER — VANCOMYCIN HCL 2000 MG/400ML IV SOLN
2000.0000 mg | Freq: Once | INTRAVENOUS | Status: AC
  Administered 2021-05-16: 2000 mg via INTRAVENOUS
  Filled 2021-05-16: qty 400

## 2021-05-16 NOTE — Consult Note (Signed)
Pharmacy Antibiotic Note ? ?Michael [name] is a 61 y.o. male admitted on 05/16/2021 with  osteomyelitis .  Pharmacy has been consulted for vancomycin dosing. ? ?Plan: Will dose @2500mg  daily (per calculator); pt only received 2000mg  LD as only amount available for nighttime dosing @AP .  Chose 2500mg  to achieve SS sooner with lesser upfront dose.  Also get greater area under the inhibitory curve with this dose.  Further evaluation of therapy indicated when levels are appropriate. ? ?AUC=506.5 ?Cmax=42 ?Cmin=10.2 ? ? ?Height: 6' (182.9 cm) ?Weight: (!) 149.2 kg (329 lb) ?IBW/kg (Calculated) : 77.6 ? ?Temp (24hrs), Avg:98.2 ?F (36.8 ?C), Min:98.2 ?F (36.8 ?C), Max:98.2 ?F (36.8 ?C) ? ?Recent Labs  ?Lab 05/16/21 ?1255  ?WBC 19.5*  ?CREATININE 1.18  ?  ?Estimated Creatinine Clearance: 101.3 mL/min (by C-G formula based on SCr of 1.18 mg/dL).   ? ?No Known Allergies ? ?Antimicrobials this admission: ?0317 Ceftriaxone 2mg  daily >>  ?0317 vanco 2000mg  LD >> 1 dose ? vanco 2500mg  daily>> ? ? ?Microbiology results: ?0317 Bcx(X2): pending ? ? ?Thank you for allowing pharmacy to be a part of this patient?s care. ? ?05/18/21, RPh ?05/16/2021 7:55 PM ? ?

## 2021-05-16 NOTE — ED Triage Notes (Signed)
Pain in mid to low back onset over a week ago ?

## 2021-05-16 NOTE — ED Notes (Signed)
Patient transported to CT 

## 2021-05-16 NOTE — ED Notes (Signed)
Advised patient we needed urine specimen.  Urinal at bedside. 

## 2021-05-16 NOTE — ED Notes (Signed)
Patient transported to MRI 

## 2021-05-16 NOTE — H&P (Signed)
? ?                                                                                                       [facility] H&P ? ? Patient Demographics:  ? ? Michael [name], is a 61 y.o. male  MRN: [id number]   DOB - Jul 19, 1960 ? ?Admit Date - 05/16/2021 ? ?Outpatient Primary MD for the patient is Lavella Lemons, [place] ? ?Referring MD/NP/PA: PA Idol ? ?Patient coming from: Home ? ?Chief Complaint  ?Patient presents with  ? Back Pain  ?  ? ? HPI:  ? ? Michael [name]  is a 61 y.o. male, with past medical history of hypertension, obesity, patient presents with back pain, he had an ED visit last week where his back pain thought to be musculoskeletal where he was discharged on Flexeril and anti-inflammatories, pain did not improve, reports it is constant, worsening with movement, he does report nausea, he denies any vomiting or fever, he denies any lower extremity weakness, or loss of sensation, reports poor appetite, weight loss, reports this back pain has been going on for last 2 to 3 weeks, as well he does endorse tooth infection 2 weeks ago, could not make his appointment with a dentist secondary to pain, reports infection has since resolved. ?- in ED patient was noted to be tachycardic, tachypneic and with elevated white blood cell count at 19, CT abdomen pelvis significant of T8-T9 discitis which was confirmed by thoracic MRI, blood cultures were sent and patient was started on antibiotic after that and [facility] hospitalist consulted to admit. ? ? ? Review of systems:  ?  ?In addition to the HPI above,  ? ?A full 10 point Review of Systems was done, except as stated above, all other Review of Systems were negative. ? ? ?With Past History of the following :  ? ?Hypertension ?Obesity ?GERD ? ? ? Social History:  ? ?  ?Social History  ? ?Tobacco Use  ? Smoking status: Never  ? Smokeless tobacco: Never  ?Substance Use Topics  ? Alcohol use: Not Currently  ?  ? ? ? ? Family History :  ? ?Family history was obtained, nonpertinent ? ? Home  Medications:  ? ?Prior to Admission medications   ?Medication Sig Start Date End Date Taking? Authorizing Provider  ?Aspirin-Salicylamide-Caffeine (BC HEADACHE POWDER PO) Take by mouth.   Yes [provider]  ?calcium carbonate (TUMS - DOSED IN MG ELEMENTAL CALCIUM) 500 MG chewable tablet Chew 1 tablet by mouth daily.   Yes [provider]  ?esomeprazole (NEXIUM) 20 MG capsule Take 20 mg by mouth daily at 12 noon.   Yes [provider]  ?naproxen sodium (ALEVE) 220 MG tablet Take 220 mg by mouth daily as needed (pain).   Yes [provider]  ?ondansetron (ZOFRAN) 4 MG tablet Take by mouth. 05/06/21  Yes [provider]  ?triamterene-hydrochlorothiazide (MAXZIDE-25) 37.5-25 MG tablet Take 1 tablet by mouth daily. 04/24/21  Yes [provider]  ?cyclobenzaprine (FLEXERIL) 10 MG tablet Take 1 tablet (10 mg total) by mouth  3 (three) times daily as needed for muscle spasms. ?Patient not taking: Reported on 05/16/2021 05/08/21   Fredia Sorrow, MD  ?HYDROcodone-acetaminophen (NORCO/VICODIN) 5-325 MG tablet Take 2 tablets by mouth every 4 (four) hours as needed. ?Patient not taking: Reported on 05/16/2021 05/08/21   Fredia Sorrow, MD  ?ondansetron (ZOFRAN-ODT) 4 MG disintegrating tablet Take 1 tablet (4 mg total) by mouth every 8 (eight) hours as needed for nausea or vomiting. ?Patient not taking: Reported on 05/16/2021 05/08/21   Fredia Sorrow, MD  ? ? ? Allergies:  ? ? No Known Allergies ? ? Physical Exam:  ? ?Vitals ? ?Blood pressure (!) 147/88, pulse (!) 109, temperature 98.2 ?F (36.8 ?C), temperature source Oral, resp. rate 19, height 6' (1.829 m), weight (!) 149.2 kg, SpO2 90 %. ? ? ?1. General obese male, laying in bed in discomfort due to med back pain ? ?2. Normal affect and insight, Not Suicidal or Homicidal, Awake Alert, Oriented X 3. ? ?3. No F.N deficits, ALL C.Nerves Intact, Strength 5/5 all 4 extremities, Sensation intact all 4 extremities, Plantars down  going. ? ?4. Ears and Eyes appear Normal, Conjunctivae clear, PERRLA. Moist Oral Mucosa.  Patient with poor dentition ? ?5. Supple Neck, No JVD, No cervical lymphadenopathy appriciated, No Carotid Bruits. ? ?6. Symmetrical Chest wall movement, Good air movement bilaterally, CTAB. ? ?7. RRR, No Gallops, Rubs or Murmurs, No Parasternal Heave. ? ?8. Positive Bowel Sounds, Abdomen Soft, No tenderness, No organomegaly appriciated,No rebound -guarding or rigidity. ? ?9.  No Cyanosis, Normal Skin Turgor, No Skin Rash or Bruise.   ? ?10. Good muscle tone,  joints appear normal , no effusions, Normal ROM. ? ? ? ? Data Review:  ? ? CBC ?Recent Labs  ?Lab 05/16/21 ?1255  ?WBC 19.5*  ?HGB 15.0  ?HCT 46.6  ?PLT 319  ?MCV 92.1  ?MCH 29.6  ?MCHC 32.2  ?RDW 14.0  ?LYMPHSABS 1.2  ?MONOABS 1.8*  ?EOSABS 0.1  ?BASOSABS 0.1  ? ?------------------------------------------------------------------------------------------------------------------ ? ?Chemistries  ?Recent Labs  ?Lab 05/16/21 ?1255  ?NA 135  ?K 4.3  ?CL 100  ?CO2 25  ?GLUCOSE 114*  ?BUN 12  ?CREATININE 1.18  ?CALCIUM 9.1  ?AST 26  ?ALT 43  ?ALKPHOS 77  ?BILITOT 1.0  ? ?------------------------------------------------------------------------------------------------------------------ ?estimated creatinine clearance is 101.3 mL/min (by C-G formula based on SCr of 1.18 mg/dL). ?------------------------------------------------------------------------------------------------------------------ ?No results for input(s): TSH, T4TOTAL, T3FREE, THYROIDAB in the last 72 hours. ? ?Invalid input(s): FREET3 ? ?Coagulation profile ?No results for input(s): INR, PROTIME in the last 168 hours. ?------------------------------------------------------------------------------------------------------------------- ?No results for input(s): DDIMER in the last 72 hours. ?------------------------------------------------------------------------------------------------------------------- ? ?Cardiac  Enzymes ?No results for input(s): CKMB, TROPONINI, MYOGLOBIN in the last 168 hours. ? ?Invalid input(s): CK ?------------------------------------------------------------------------------------------------------------------ ?No results found for: BNP ? ? ?--------------------------------------------------------------------------------------------------------------- ? ?Urinalysis ?   ?Component Value Date/Time  ? COLORURINE YELLOW 05/16/2021 1450  ? APPEARANCEUR HAZY (A) 05/16/2021 1450  ? LABSPEC 1.021 05/16/2021 1450  ? PHURINE 5.0 05/16/2021 1450  ? GLUCOSEU NEGATIVE 05/16/2021 1450  ? HGBUR NEGATIVE 05/16/2021 1450  ? BILIRUBINUR NEGATIVE 05/16/2021 1450  ? KETONESUR 5 (A) 05/16/2021 1450  ? PROTEINUR NEGATIVE 05/16/2021 1450  ? NITRITE NEGATIVE 05/16/2021 1450  ? LEUKOCYTESUR SMALL (A) 05/16/2021 1450  ? ? ?---------------------------------------------------------------------------------------------------------------- ? ? Imaging Results:  ? ? CT Angio Chest PE W and/or Wo Contrast ? ?Result Date: 05/16/2021 ?CLINICAL DATA:  Intermittent mid to low back pain for the past week. EXAM: CT ANGIOGRAPHY CHEST CT ABDOMEN AND PELVIS WITH CONTRAST  TECHNIQUE: Multidetector CT imaging of the chest was performed using the standard protocol during bolus administration of intravenous contrast. Multiplanar CT image reconstructions and MIPs were obtained to evaluate the vascular anatomy. Multidetector CT imaging of the abdomen and pelvis was performed using the standard protocol during bolus administration of intravenous contrast. RADIATION DOSE REDUCTION: This exam was performed according to the departmental dose-optimization program which includes automated exposure control, adjustment of the mA and/or kV according to patient size and/or use of iterative reconstruction technique. CONTRAST:  147mL OMNIPAQUE IOHEXOL 350 MG/ML SOLN COMPARISON:  CT abdomen pelvis dated May 08, 2021. FINDINGS: CTA CHEST FINDINGS Cardiovascular:  Satisfactory opacification of the pulmonary arteries to the segmental level. No evidence of pulmonary embolism. Normal heart size. No pericardial effusion. No thoracic aortic aneurysm or dissection. Mediastinum/Node

## 2021-05-16 NOTE — ED Provider Notes (Signed)
?[name] ?Provider Note ? ? ?CSN: [id number] ?Arrival date & time: 05/16/21  1127 ? ?  ? ?History ? ?Chief Complaint  ?Patient presents with  ? Back Pain  ? ? ?Michael [name] is a 61 y.o. male with a history including hypertension and obesity, was seen here last week and diagnosed with musculoskeletal back pain returning for persistent pain that has not responded to the anti-inflammatories and muscle relaxers prescribed.  He describes right sided mid back pain which is constant, worse with movement but has been associated with some nausea but no emesis and no fevers.  His work-up last week was also significant for gallstones found on CT scan, patient denies abdominal pain and symptoms are triggered by movement, not p.o. intake.  He denies chest pain, denies shortness of breath.  Pain is worsened with movement.  Wife at bedside states that she is also been applying a muscle rub to the site, denies any rash, swelling at the site, no recent falls or injury. ? ?The history is provided by the patient.  ? ?  ? ?Home Medications ?Prior to Admission medications   ?Medication Sig Start Date End Date Taking? Authorizing Provider  ?Aspirin-Salicylamide-Caffeine (BC HEADACHE POWDER PO) Take by mouth.   Yes [provider]  ?calcium carbonate (TUMS - DOSED IN MG ELEMENTAL CALCIUM) 500 MG chewable tablet Chew 1 tablet by mouth daily.   Yes [provider]  ?esomeprazole (NEXIUM) 20 MG capsule Take 20 mg by mouth daily at 12 noon.   Yes [provider]  ?naproxen sodium (ALEVE) 220 MG tablet Take 220 mg by mouth daily as needed (pain).   Yes [provider]  ?ondansetron (ZOFRAN) 4 MG tablet Take by mouth. 05/06/21  Yes [provider]  ?triamterene-hydrochlorothiazide (MAXZIDE-25) 37.5-25 MG tablet Take 1 tablet by mouth daily. 04/24/21  Yes [provider]  ?cyclobenzaprine (FLEXERIL) 10 MG tablet Take 1 tablet (10 mg total) by mouth 3 (three) times daily  as needed for muscle spasms. ?Patient not taking: Reported on 05/16/2021 05/08/21   Vanetta MuldersZackowski, Scott, MD  ?HYDROcodone-acetaminophen (NORCO/VICODIN) 5-325 MG tablet Take 2 tablets by mouth every 4 (four) hours as needed. ?Patient not taking: Reported on 05/16/2021 05/08/21   Vanetta MuldersZackowski, Scott, MD  ?ondansetron (ZOFRAN-ODT) 4 MG disintegrating tablet Take 1 tablet (4 mg total) by mouth every 8 (eight) hours as needed for nausea or vomiting. ?Patient not taking: Reported on 05/16/2021 05/08/21   Vanetta MuldersZackowski, Scott, MD  ?   ? ?Allergies    ?Patient has no known allergies.   ? ?Review of Systems   ?Review of Systems  ?Constitutional:  Negative for chills and fever.  ?Gastrointestinal:  Positive for nausea. Negative for vomiting.  ?Musculoskeletal:  Positive for back pain. Negative for myalgias.  ?Neurological:  Negative for weakness and numbness.  ? ?Physical Exam ?Updated Vital Signs ?BP (!) 133/92   Pulse (!) 119   Temp 98.2 ?F (36.8 ?C) (Oral)   Resp 18   Ht 6' (1.829 m)   Wt (!) 149.2 kg   SpO2 94%   BMI 44.62 kg/m?  ?Physical Exam ?Vitals and nursing note reviewed.  ?Constitutional:   ?   Appearance: He is well-developed.  ?HENT:  ?   Head: Normocephalic and atraumatic.  ?Eyes:  ?   Conjunctiva/sclera: Conjunctivae normal.  ?Cardiovascular:  ?   Rate and Rhythm: Normal rate and regular rhythm.  ?   Heart sounds: Normal heart sounds.  ?Pulmonary:  ?   Effort:  Pulmonary effort is normal.  ?   Breath sounds: Normal breath sounds. No wheezing.  ?Abdominal:  ?   General: Bowel sounds are normal.  ?   Palpations: Abdomen is soft.  ?   Tenderness: There is no abdominal tenderness. There is no guarding or rebound.  ?Musculoskeletal:     ?   General: Normal range of motion.  ?   Cervical back: Normal range of motion.  ?Skin: ?   General: Skin is warm and dry.  ?Neurological:  ?   Mental Status: He is alert.  ? ? ?ED Results / Procedures / Treatments   ?Labs ?(all labs ordered are listed, but only abnormal results are  displayed) ?Labs Reviewed  ?CBC WITH DIFFERENTIAL/PLATELET - Abnormal; Notable for the following components:  ?    Result Value  ? WBC 19.5 (*)   ? Neutro Abs 16.2 (*)   ? Monocytes Absolute 1.8 (*)   ? Abs Immature Granulocytes 0.20 (*)   ? All other components within normal limits  ?COMPREHENSIVE METABOLIC PANEL - Abnormal; Notable for the following components:  ? Glucose, Bld 114 (*)   ? Total Protein 8.2 (*)   ? Albumin 3.4 (*)   ? All other components within normal limits  ?URINALYSIS, ROUTINE W REFLEX MICROSCOPIC - Abnormal; Notable for the following components:  ? APPearance HAZY (*)   ? Ketones, ur 5 (*)   ? Leukocytes,Ua SMALL (*)   ? All other components within normal limits  ?CULTURE, BLOOD (ROUTINE X 2)  ?CULTURE, BLOOD (ROUTINE X 2)  ?RESP PANEL BY RT-PCR (FLU A&B, COVID) [id number]  ?LIPASE, BLOOD  ?SEDIMENTATION RATE  ?C-REACTIVE PROTEIN  ? ? ?EKG ?None ? ?Radiology ?CT Angio Chest PE W and/or Wo Contrast ? ?Result Date: 05/16/2021 ?CLINICAL DATA:  Intermittent mid to low back pain for the past week. EXAM: CT ANGIOGRAPHY CHEST CT ABDOMEN AND PELVIS WITH CONTRAST TECHNIQUE: Multidetector CT imaging of the chest was performed using the standard protocol during bolus administration of intravenous contrast. Multiplanar CT image reconstructions and MIPs were obtained to evaluate the vascular anatomy. Multidetector CT imaging of the abdomen and pelvis was performed using the standard protocol during bolus administration of intravenous contrast. RADIATION DOSE REDUCTION: This exam was performed according to the departmental dose-optimization program which includes automated exposure control, adjustment of the mA and/or kV according to patient size and/or use of iterative reconstruction technique. CONTRAST:  176mL OMNIPAQUE IOHEXOL 350 MG/ML SOLN COMPARISON:  CT abdomen pelvis dated May 08, 2021. FINDINGS: CTA CHEST FINDINGS Cardiovascular: Satisfactory opacification of the pulmonary arteries to the segmental  level. No evidence of pulmonary embolism. Normal heart size. No pericardial effusion. No thoracic aortic aneurysm or dissection. Mediastinum/Nodes: No enlarged mediastinal, hilar, or axillary lymph nodes. Thyroid gland, trachea, and esophagus demonstrate no significant findings. Lungs/Pleura: No focal consolidation, pleural effusion, or pneumothorax. 4 mm nodule in the right middle lobe (series 5, image 81). Minimal right apical pleuroparenchymal scarring. Minimal bibasilar subsegmental atelectasis. Musculoskeletal: New subtle T9 anterior superior endplate irregularity and lucency (series 7, image 105) with paravertebral inflammatory change at T8-T9 (series 3, image 72; series 6, image 120). Review of the MIP images confirms the above findings. CT ABDOMEN AND PELVIS FINDINGS Hepatobiliary: No focal liver abnormality. Unchanged tiny gallstones. No gallbladder wall thickening or biliary dilatation. Pancreas: Unremarkable. No pancreatic ductal dilatation or surrounding inflammatory changes. Spleen: Normal in size without focal abnormality. Adrenals/Urinary Tract: Unchanged 1.2 cm right adrenal adenoma. No further imaging follow-up required. Left adrenal gland  is unremarkable. No renal mass, calculi, or hydronephrosis. The bladder is decompressed. Stomach/Bowel: Stomach is within normal limits. Appendix appears normal. No evidence of bowel wall thickening, distention, or inflammatory changes. Vascular/Lymphatic: No significant vascular findings are present. No enlarged abdominal or pelvic lymph nodes. Reproductive: Prostate is unremarkable. Other: No free fluid or pneumoperitoneum. Musculoskeletal: No acute or significant osseous findings. Review of the MIP images confirms the above findings. IMPRESSION: Chest: 1. New subtle T9 anterior superior endplate irregularity and lucency with paravertebral inflammatory change at T8-T9, concerning for acute osteomyelitis discitis. MRI of the thoracic spine with and without  contrast is recommended for further evaluation. 2. No acute pulmonary embolism. Abdomen and pelvis: 1. No acute intra-abdominal process. 2. Unchanged cholelithiasis. Electronically Signed   By: Titus Dubin M.D.   On: 03/17

## 2021-05-17 ENCOUNTER — Encounter (HOSPITAL_COMMUNITY): Payer: Self-pay | Admitting: Internal Medicine

## 2021-05-17 DIAGNOSIS — M8618 Other acute osteomyelitis, other site: Secondary | ICD-10-CM | POA: Diagnosis not present

## 2021-05-17 DIAGNOSIS — I10 Essential (primary) hypertension: Secondary | ICD-10-CM | POA: Diagnosis not present

## 2021-05-17 DIAGNOSIS — R7881 Bacteremia: Secondary | ICD-10-CM

## 2021-05-17 LAB — BLOOD CULTURE ID PANEL (REFLEXED) - BCID2

## 2021-05-17 LAB — BASIC METABOLIC PANEL
Anion gap: 9 (ref 5–15)
BUN: 8 mg/dL (ref 6–20)
CO2: 24 mmol/L (ref 22–32)
Calcium: 8.8 mg/dL — ABNORMAL LOW (ref 8.9–10.3)
Chloride: 103 mmol/L (ref 98–111)
Creatinine, Ser: 1.25 mg/dL — ABNORMAL HIGH (ref 0.61–1.24)
GFR, Estimated: >60 mL/min (ref >60)
Glucose, Bld: 111 mg/dL — ABNORMAL HIGH (ref 70–99)
Potassium: 4.2 mmol/L (ref 3.5–5.1)
Sodium: 136 mmol/L (ref 135–145)

## 2021-05-17 LAB — CBC
HCT: 41.8 % (ref 39.0–52.0)
Hemoglobin: 14 g/dL (ref 13.0–17.0)
MCH: 30.4 pg (ref 26.0–34.0)
MCHC: 33.5 g/dL (ref 30.0–36.0)
MCV: 90.7 fL (ref 80.0–100.0)
Platelets: 273 10*3/uL (ref 150–400)
RBC: 4.61 MIL/uL (ref 4.22–5.81)
RDW: 13.9 % (ref 11.5–15.5)
WBC: 17.1 10*3/uL — ABNORMAL HIGH (ref 4.0–10.5)
nRBC: 0 % (ref 0.0–0.2)

## 2021-05-17 LAB — HIV ANTIBODY (ROUTINE TESTING W REFLEX): HIV Screen 4th Generation wRfx: NONREACTIVE

## 2021-05-17 NOTE — Assessment & Plan Note (Addendum)
Thoracic location; T8-T9. Noted on MRI. Recent dental infection. Patient started empirically on Vancomycin and Ceftriaxone. IR consulted for disc aspiration. ID consulted. Blood cultures significant for E. Coli. ?-ID recommendations: Vancomycin discontinued. Pending today ?-IR recommendations: Disc aspiration today ?-Continue Ceftriaxone ?-Analgesics for back pain ?-PT/OT for mobility complicated by pain ?

## 2021-05-17 NOTE — Assessment & Plan Note (Signed)
Blood cultures with GNR on gram stain. Culture results pending.  ?-See problem, Osteomyelitis ?

## 2021-05-17 NOTE — Progress Notes (Signed)
? ?PROGRESS NOTE ? ? ? ?Michael PlumberJohn R Lindroth  NWG:[id number] DOB: [birth year] DOA: 05/16/2021 ?PCP: Lovey NewcomerBoyd, William S, PA ? ? ?Brief Narrative: ?Michael [name] is a 61 y.o. male with a history of hypertension and obesity. Patient presented secondary to back pain and was found to have evidence of vertebral osteomyelitis/discitis. Empiric Vancomycin and Ceftriaxone started. ID consulted. Cultures pending with plan for vertebral disc aspiration. ? ? ?Assessment and Plan: ?* Osteomyelitis (HCC) ?Thoracic location; T8-T9. Noted on MRI. Recent dental infection. Patient started empirically on Vancomycin and Ceftriaxone. IR consulted for disc aspiration. ID consulted. Blood cultures with GNR on gram stain. ?-ID recommendations: pending today ?-IR recommendations: plan for disc aspiration on 3/19 ?-Continue Vancomycin/Ceftriaxone ? ?Gram-negative bacteremia ?Blood cultures with GNR on gram stain. Culture results pending.  ?-See problem, Osteomyelitis ? ?Obesity, Class III, BMI 40-49.9 (morbid obesity) (HCC) ?Body mass index is 44.62 kg/m?. ? ?Essential hypertension ?-Continue home triamterene and hydrochlorothiazide ? ? ? ?DVT prophylaxis: Lovenox ?Code Status:   Code Status: Full Code ?Family Communication: Wife and mother at bedside ?Disposition Plan: Discharge home pending ID recommendations, including outpatient antibiotic regimen ? ? ?Consultants:  ?Infectious disease ?Interventional radiology ? ?Procedures:  ?None ? ?Antimicrobials: ?Vancomycin ?Ceftriaxone  ? ? ?Subjective: ?Patient's back pain is better controlled with analgesics. No other concerns. Patient reports no paresthesias or increased weakness. He reports issues with walking are secondary to pain. ? ?Objective: ?BP 133/82 (BP Location: Left Arm)   Pulse 100   Temp 98 ?F (36.7 ?C) (Oral)   Resp 18   Ht 6' (1.829 m)   Wt (!) 149.2 kg   SpO2 95%   BMI 44.62 kg/m?  ? ?Examination: ? ?General exam: Appears calm and comfortable ?Respiratory system: Clear to  auscultation. Respiratory effort normal. ?Cardiovascular system: S1 & S2 heard, RRR. ?Gastrointestinal system: Abdomen is nondistended, soft and nontender. Normal bowel sounds heard. ?Central nervous system: Alert and oriented. No focal neurological deficits. ?Musculoskeletal:  No calf tenderness ?Skin: No cyanosis. No rashes ?Psychiatry: Judgement and insight appear normal. Mood & affect appropriate.  ? ? ?Data Reviewed: I have personally reviewed following labs and imaging studies ? ?CBC ?Lab Results  ?Component Value Date  ? WBC 19.5 (H) 05/16/2021  ? RBC 5.06 05/16/2021  ? HGB 15.0 05/16/2021  ? HCT 46.6 05/16/2021  ? MCV 92.1 05/16/2021  ? MCH 29.6 05/16/2021  ? PLT 319 05/16/2021  ? MCHC 32.2 05/16/2021  ? RDW 14.0 05/16/2021  ? LYMPHSABS 1.2 05/16/2021  ? MONOABS 1.8 (H) 05/16/2021  ? EOSABS 0.1 05/16/2021  ? BASOSABS 0.1 05/16/2021  ? ? ? ?Last metabolic panel ?Lab Results  ?Component Value Date  ? NA 135 05/16/2021  ? K 4.3 05/16/2021  ? CL 100 05/16/2021  ? CO2 25 05/16/2021  ? BUN 12 05/16/2021  ? CREATININE 1.18 05/16/2021  ? GLUCOSE 114 (H) 05/16/2021  ? GFRNONAA >60 05/16/2021  ? CALCIUM 9.1 05/16/2021  ? PROT 8.2 (H) 05/16/2021  ? ALBUMIN 3.4 (L) 05/16/2021  ? BILITOT 1.0 05/16/2021  ? ALKPHOS 77 05/16/2021  ? AST 26 05/16/2021  ? ALT 43 05/16/2021  ? ANIONGAP 10 05/16/2021  ? ? ?GFR: ?Estimated Creatinine Clearance: 101.3 mL/min (by C-G formula based on SCr of 1.18 mg/dL). ? ?Recent Results (from the past 240 hour(s))  ?Resp Panel by RT-PCR (Flu A&B, Covid) Nasopharyngeal Swab     Status: None  ? Collection Time: 05/08/21  6:09 PM  ? Specimen: Nasopharyngeal Swab; Nasopharyngeal(NP) swabs in vial transport medium  ?  Result Value Ref Range Status  ? SARS Coronavirus 2 by RT PCR NEGATIVE NEGATIVE Final  ?  Comment: (NOTE) ?SARS-CoV-2 target nucleic acids are NOT DETECTED. ? ?The SARS-CoV-2 RNA is generally detectable in upper respiratory ?specimens during the acute phase of infection. The  lowest ?concentration of SARS-CoV-2 viral copies this assay can detect is ?138 copies/mL. A negative result does not preclude SARS-Cov-2 ?infection and should not be used as the sole basis for treatment or ?other patient management decisions. A negative result may occur with  ?improper specimen collection/handling, submission of specimen other ?than nasopharyngeal swab, presence of viral mutation(s) within the ?areas targeted by this assay, and inadequate number of viral ?copies(<138 copies/mL). A negative result must be combined with ?clinical observations, patient history, and epidemiological ?information. The expected result is Negative. ? ?Fact Sheet for Patients:  ?BloggerCourse.com ? ?Fact Sheet for Healthcare Providers:  ?SeriousBroker.it ? ?This test is no t yet approved or cleared by the [place] FDA and  ?has been authorized for detection and/or diagnosis of SARS-CoV-2 by ?FDA under an Emergency Use Authorization (EUA). This EUA will remain  ?in effect (meaning this test can be used) for the duration of the ?COVID-19 declaration under Section 564(b)(1) of the Act, 21 ?U.S.C.section 360bbb-3(b)(1), unless the authorization is terminated  ?or revoked sooner.  ? ? ?  ? Influenza A by PCR NEGATIVE NEGATIVE Final  ? Influenza B by PCR NEGATIVE NEGATIVE Final  ?  Comment: (NOTE) ?The Xpert Xpress SARS-CoV-2/FLU/RSV plus assay is intended as an aid ?in the diagnosis of influenza from Nasopharyngeal swab specimens and ?should not be used as a sole basis for treatment. Nasal washings and ?aspirates are unacceptable for Xpert Xpress SARS-CoV-2/FLU/RSV ?testing. ? ?Fact Sheet for Patients: ?BloggerCourse.com ? ?Fact Sheet for Healthcare Providers: ?SeriousBroker.it ? ?This test is not yet approved or cleared by the [place] FDA and ?has been authorized for detection and/or diagnosis of SARS-CoV-2 by ?FDA under  an Emergency Use Authorization (EUA). This EUA will remain ?in effect (meaning this test can be used) for the duration of the ?COVID-19 declaration under Section 564(b)(1) of the Act, 21 U.S.C. ?section 360bbb-3(b)(1), unless the authorization is terminated or ?revoked. ? ?Performed at [facility], [address], [place], [place] [address] ?  ?Blood culture (routine x 2)     Status: None (Preliminary result)  ? Collection Time: 05/16/21  6:00 PM  ? Specimen: Right Antecubital; Blood  ?Result Value Ref Range Status  ? Specimen Description RIGHT ANTECUBITAL  Final  ? Special Requests   Final  ?  BOTTLES DRAWN AEROBIC AND ANAEROBIC Blood Culture adequate volume  ? Culture  Setup Time   Final  ?  GRAM NEGATIVE RODS ?IN BOTH AEROBIC AND ANAEROBIC BOTTLES ?Gram Stain Report Called to,Read Back By and Verified With: KUFFOR @ 1220 ON [id number] BY HENDERSON L ?  ? Culture   Final  ?  NO GROWTH < 24 HOURS ?Performed at [facility], [address], [place], [place] [address] ?  ? Report Status PENDING  Incomplete  ?Blood culture (routine x 2)     Status: None (Preliminary result)  ? Collection Time: 05/16/21  6:00 PM  ? Specimen: BLOOD LEFT HAND  ?Result Value Ref Range Status  ? Specimen Description BLOOD LEFT HAND  Final  ? Special Requests   Final  ?  BOTTLES DRAWN AEROBIC AND ANAEROBIC Blood Culture adequate volume  ? Culture  Setup Time   Final  ?  AEROBIC BOTTLE POSITIVE ?  GRAM NEGATIVE RODS GRAM STAIN PREVIOUSLY CALLED ?Performed at [facility] [address], [place], [place] [address] ?  ? Culture PENDING  Incomplete  ? Report Status PENDING  Incomplete  ?Resp Panel by RT-PCR (Flu A&B, Covid) Nasopharyngeal Swab     Status: None  ? Collection Time: 05/16/21  7:30 PM  ? Specimen: Nasopharyngeal Swab; Nasopharyngeal(NP) swabs in vial transport medium  ?Result Value Ref Range Status  ? SARS Coronavirus 2 by RT PCR NEGATIVE NEGATIVE Final  ?  Comment: (NOTE) ?SARS-CoV-2 target nucleic acids are NOT DETECTED. ? ?The  SARS-CoV-2 RNA is generally detectable in upper respiratory ?specimens during the acute phase of infection. The lowest ?concentration of SARS-CoV-2 viral copies this assay can detect is ?138 copies/mL. A negative result does not preclude

## 2021-05-17 NOTE — Progress Notes (Signed)
?  Transition of Care (TOC) Screening Note ? ? ?Patient Details  ?Name: ESKER DEVER ?Date of Birth: 24-Dec-1960 ? ? ?Transition of Care (TOC) CM/SW Contact:    ?Windle Guard, LCSW ?Phone Number: ?05/17/2021, 8:41 AM ? ? ? ?Transition of Care Department [facility]) has reviewed patient and noted no immediate TOC needs pending continued medical work-up. TOC team will continue to monitor patient advancement through interdisciplinary progression rounds to support any identified discharge supports as needed. If new patient transition needs arise, please place a TOC consult or reach out to [facility] team.  ?  ?

## 2021-05-17 NOTE — Assessment & Plan Note (Signed)
Body mass index is 44.62 kg/m.

## 2021-05-17 NOTE — Assessment & Plan Note (Signed)
-  Continue home triamterene and hydrochlorothiazide ?

## 2021-05-17 NOTE — Hospital Course (Signed)
Michael [name] is a 61 y.o. male with a history of hypertension and obesity. Patient presented secondary to back pain and was found to have evidence of vertebral osteomyelitis/discitis. Empiric Vancomycin and Ceftriaxone started. ID consulted. Cultures pending with plan for vertebral disc aspiration. ?

## 2021-05-17 NOTE — Progress Notes (Signed)
?   05/17/21 1222  ?Provider Notification  ?Provider Name/Title Dr. Tish Frederickson  ?Date Provider Notified 05/17/21  ?Time Provider Notified 1221  ?Notification Type Page  ?Notification Reason Critical result  ?Test performed and critical result (S)  Blood cultures: Both Aerobic and Anaerobic cultures are positive for Gram Negative Rods  ?Date Critical Result Received 05/17/21  ?Time Critical Result Received 1220  ?Provider response In department;At bedside  ?Date of Provider Response 05/17/21  ?Time of Provider Response 1235  ? ? ?

## 2021-05-17 NOTE — Progress Notes (Signed)
PHARMACY - PHYSICIAN COMMUNICATION ?CRITICAL VALUE ALERT - BLOOD CULTURE IDENTIFICATION (BCID) ? ?Michael [name] is an 61 y.o. male who presented to [facility] on 05/16/2021 with a chief complaint of back pain found to have evidence of vertebral OM/discitis.  ? ?Assessment:  MRI showing T8-T9 OM discitis with mild epidural and paravertebral inflammation without abscess. 3/4 Bcx bottles growing GNR - BCID showing E Coli (without resistance). ? ?Name of physician (or Provider) Contacted: Dr Renold Don ? ?Current antibiotics: Ceftriaxone 2g IV every 24 hours + vancomycin 2500 mg IV every 24 hours ? ?Changes to prescribed antibiotics recommended:  ?Continue on ceftriaxone 2g IV every 24 hours - discontinue vancomycin.  ? ?Results for orders placed or performed during the hospital encounter of 05/16/21  ?Blood Culture ID Panel (Reflexed) (Collected: 05/16/2021  6:00 PM)  ?Result Value Ref Range  ? Enterococcus faecalis NOT DETECTED NOT DETECTED  ? Enterococcus Faecium NOT DETECTED NOT DETECTED  ? Listeria monocytogenes NOT DETECTED NOT DETECTED  ? Staphylococcus species NOT DETECTED NOT DETECTED  ? Staphylococcus aureus (BCID) NOT DETECTED NOT DETECTED  ? Staphylococcus epidermidis NOT DETECTED NOT DETECTED  ? Staphylococcus lugdunensis NOT DETECTED NOT DETECTED  ? Streptococcus species NOT DETECTED NOT DETECTED  ? Streptococcus agalactiae NOT DETECTED NOT DETECTED  ? Streptococcus pneumoniae NOT DETECTED NOT DETECTED  ? Streptococcus pyogenes NOT DETECTED NOT DETECTED  ? A.calcoaceticus-baumannii NOT DETECTED NOT DETECTED  ? Bacteroides fragilis NOT DETECTED NOT DETECTED  ? Enterobacterales DETECTED (A) NOT DETECTED  ? Enterobacter cloacae complex NOT DETECTED NOT DETECTED  ? Escherichia coli DETECTED (A) NOT DETECTED  ? Klebsiella aerogenes NOT DETECTED NOT DETECTED  ? Klebsiella oxytoca NOT DETECTED NOT DETECTED  ? Klebsiella pneumoniae NOT DETECTED NOT DETECTED  ? Proteus species NOT DETECTED NOT DETECTED  ? Salmonella  species NOT DETECTED NOT DETECTED  ? Serratia marcescens NOT DETECTED NOT DETECTED  ? Haemophilus influenzae NOT DETECTED NOT DETECTED  ? Neisseria meningitidis NOT DETECTED NOT DETECTED  ? Pseudomonas aeruginosa NOT DETECTED NOT DETECTED  ? Stenotrophomonas maltophilia NOT DETECTED NOT DETECTED  ? Candida albicans NOT DETECTED NOT DETECTED  ? Candida auris NOT DETECTED NOT DETECTED  ? Candida glabrata NOT DETECTED NOT DETECTED  ? Candida krusei NOT DETECTED NOT DETECTED  ? Candida parapsilosis NOT DETECTED NOT DETECTED  ? Candida tropicalis NOT DETECTED NOT DETECTED  ? Cryptococcus neoformans/gattii NOT DETECTED NOT DETECTED  ? CTX-M ESBL NOT DETECTED NOT DETECTED  ? Carbapenem resistance IMP NOT DETECTED NOT DETECTED  ? Carbapenem resistance KPC NOT DETECTED NOT DETECTED  ? Carbapenem resistance NDM NOT DETECTED NOT DETECTED  ? Carbapenem resist OXA 48 LIKE NOT DETECTED NOT DETECTED  ? Carbapenem resistance VIM NOT DETECTED NOT DETECTED  ? ? ?Sherron Monday, PharmD, BCCCP ?Clinical Pharmacist  ?Phone: [phone] ?05/17/2021 5:19 PM ? ?Please check AMION for all [facility] Pharm[facility] phone numbers ?After 10:00 PM, call Main Pharmacy [phone] ? ? ?

## 2021-05-17 NOTE — Consult Note (Signed)
?   ? ? ? ? ?[facility]   ? ?Date of Admission:  05/16/2021    ? ?Reason for Consult: T8-9 imaging OM discitis    ?Referring Provider: Caleb Popp ? ? ?Abx: ?3/17-c vanc ?3/17-c ceftriaxone      ? ? ?Assessment: ?61 yo male obese, htn, admitted 3/17 for acute 2 week worsening back pain found to have T8-9 mri imaging suggestion of [name]/discitis. He exhibit SIRs physiology/leukocytosis and was started on empiric abx ? ?He is currently at [facility] awaiting IR biopsy of the affected area ? ?Of note, ct renal protocol 10 days prior to this admission didn't reveal concerning t8-9 changes. And also he had concern for tooth pain/dental infection just prior to back pain onset that had resolved prior to this admission; no abx taken and no dental visit yet ? ?Blood culture were obtained prior to abx initiation --> gnr, in setting sirs/leukocytosis ? ?Plan: ?Continue empiric ceftriaxone; ok to stop vanc ?Follow blood culture final report ?Await IR guided biopsy of the disc/vertebral body -- would still do. Staph aureus bsi correlate better with osseous involvement than gnr.  ?Discussed with primary team  ? ? ? ? ?------------------------------------------------ ?Principal Problem: ?  [name] (HCC) ?Active Problems: ?  Essential hypertension ?  Obesity (BMI 30-39.9) ? ? ? ?HPI: Michael [name] is a 61 y.o. male admitted 3/17 for worsening back pain found to have imaging suggestion of [name] ? ?Patient developed acute onset mid back pain 2-3 weeks prior to admission. This is progressive. Denies fever/chill or trauma. Denies hx ivdu or recent medical/dental procedures ? ?He had some tooth pain he was concerned for it being infected about same time of back pain onset. No abx; no dental visit. Had resolved prior to this admisison ? ?However, there was an rx for 10 days doxycycline from 2/25-3/09 ? ?Due to worsening back pain went for evaluation. ?Denies LE deficit or urinary  incontinence ? ?Afebrile on presentation but with sinus tach ?Wbc also is elevated at 19 ?Ct abd/pelv and cta chest suggest t8-9 process confirmed on mri ?Patient was admitted for further w/u ? ? ?Of note, he had another urgent care visit 10 days prior to this admission for same. He had a renal protocol that didn't show t8-9 process ? ?Blood cx obtained ?Patient started on vanc/ceftriaxone for sirs/wbc elevation ? ? ? ?Fam hx: ?No immunosuppression condition ? ?Social History  ? ?Tobacco Use  ? Smoking status: Never  ? Smokeless tobacco: Never  ?Substance Use Topics  ? Alcohol use: Not Currently  ? Drug use: Not Currently  ? ? ?No Known Allergies ? ?Review of Systems: ?ROS ?All Other ROS was negative, except mentioned above ? ? ?History reviewed. No pertinent past medical history. ? ? ? ? ?Scheduled Meds: ? enoxaparin (LOVENOX) injection  75 mg Subcutaneous Q24H  ? pantoprazole  40 mg Oral Daily  ? triamterene-hydrochlorothiazide  1 tablet Oral Daily  ? ?Continuous Infusions: ? sodium chloride 100 mL/hr at 05/17/21 0924  ? cefTRIAXone (ROCEPHIN)  IV Stopped (05/16/21 2032)  ? vancomycin    ? ?PRN Meds:.acetaminophen **OR** acetaminophen, HYDROmorphone (DILAUDID) injection, ondansetron (ZOFRAN) IV, oxyCODONE ? ? ?OBJECTIVE: ?Blood pressure 131/80, pulse (!) 109, temperature 98.4 ?F (36.9 ?C), temperature source Oral, resp. rate 20, height 6' (1.829 m), weight (!) 149.2 kg, SpO2 97 %. ? ?Physical Exam ?General/constitutional: no distress, pleasant ?HEENT: Normocephalic, PER, Conj Clear, EOMI, Oropharynx clear ?Neck supple ?CV: rrr no mrg ?Lungs: clear to auscultation,  normal respiratory effort ?Abd: Soft, Nontender ?Ext: no edema ?Skin: No Rash ?Neuro: nonfocal ?MSK: no peripheral joint swelling/tenderness/warmth; slight mid back tenderness ? ? ? ? ?Lab Results ?Lab Results  ?Component Value Date  ? WBC 19.5 (H) 05/16/2021  ? HGB 15.0 05/16/2021  ? HCT 46.6 05/16/2021  ? MCV 92.1 05/16/2021  ? PLT 319 05/16/2021  ?   ?Lab Results  ?Component Value Date  ? CREATININE 1.18 05/16/2021  ? BUN 12 05/16/2021  ? NA 135 05/16/2021  ? K 4.3 05/16/2021  ? CL 100 05/16/2021  ? CO2 25 05/16/2021  ?  ?Lab Results  ?Component Value Date  ? ALT 43 05/16/2021  ? AST 26 05/16/2021  ? ALKPHOS 77 05/16/2021  ? BILITOT 1.0 05/16/2021  ?  ? ? ?Microbiology: ?Recent Results (from the past 240 hour(s))  ?Resp Panel by RT-PCR (Flu A&B, Covid) Nasopharyngeal Swab     Status: None  ? Collection Time: 05/08/21  6:09 PM  ? Specimen: Nasopharyngeal Swab; Nasopharyngeal(NP) swabs in vial transport medium  ?Result Value Ref Range Status  ? SARS Coronavirus 2 by RT PCR NEGATIVE NEGATIVE Final  ?  Comment: (NOTE) ?SARS-CoV-2 target nucleic acids are NOT DETECTED. ? ?The SARS-CoV-2 RNA is generally detectable in upper respiratory ?specimens during the acute phase of infection. The lowest ?concentration of SARS-CoV-2 viral copies this assay can detect is ?138 copies/mL. A negative result does not preclude SARS-Cov-2 ?infection and should not be used as the sole basis for treatment or ?other patient management decisions. A negative result may occur with  ?improper specimen collection/handling, submission of specimen other ?than nasopharyngeal swab, presence of viral mutation(s) within the ?areas targeted by this assay, and inadequate number of viral ?copies(<138 copies/mL). A negative result must be combined with ?clinical observations, patient history, and epidemiological ?information. The expected result is Negative. ? ?Fact Sheet for Patients:  ?EntrepreneurPulse.com.au ? ?Fact Sheet for Healthcare Providers:  ?IncredibleEmployment.be ? ?This test is no t yet approved or cleared by the [place] FDA and  ?has been authorized for detection and/or diagnosis of SARS-CoV-2 by ?FDA under an Emergency Use Authorization (EUA). This EUA will remain  ?in effect (meaning this test can be used) for the duration of the ?COVID-19  declaration under Section 564(b)(1) of the Act, 21 ?U.S.C.section 360bbb-3(b)(1), unless the authorization is terminated  ?or revoked sooner.  ? ? ?  ? Influenza A by PCR NEGATIVE NEGATIVE Final  ? Influenza B by PCR NEGATIVE NEGATIVE Final  ?  Comment: (NOTE) ?The Xpert Xpress SARS-CoV-2/FLU/RSV plus assay is intended as an aid ?in the diagnosis of influenza from Nasopharyngeal swab specimens and ?should not be used as a sole basis for treatment. Nasal washings and ?aspirates are unacceptable for Xpert Xpress SARS-CoV-2/FLU/RSV ?testing. ? ?Fact Sheet for Patients: ?EntrepreneurPulse.com.au ? ?Fact Sheet for Healthcare Providers: ?IncredibleEmployment.be ? ?This test is not yet approved or cleared by the [place] FDA and ?has been authorized for detection and/or diagnosis of SARS-CoV-2 by ?FDA under an Emergency Use Authorization (EUA). This EUA will remain ?in effect (meaning this test can be used) for the duration of the ?COVID-19 declaration under Section 564(b)(1) of the Act, 21 U.S.C. ?section 360bbb-3(b)(1), unless the authorization is terminated or ?revoked. ? ?Performed at [facility], [address] [place], [place] [address] ?  ?Blood culture (routine x 2)     Status: None (Preliminary result)  ? Collection Time: 05/16/21  6:00 PM  ? Specimen: Right Antecubital; Blood  ?Result Value Ref Range  Status  ? Specimen Description RIGHT ANTECUBITAL  Final  ? Special Requests   Final  ?  BOTTLES DRAWN AEROBIC AND ANAEROBIC Blood Culture adequate volume  ? Culture   Final  ?  NO GROWTH < 24 HOURS ?Performed at [facility], [address] [place], [place] [address] ?  ? Report Status PENDING  Incomplete  ?Blood culture (routine x 2)     Status: None (Preliminary result)  ? Collection Time: 05/16/21  6:00 PM  ? Specimen: BLOOD LEFT HAND  ?Result Value Ref Range Status  ? Specimen Description BLOOD LEFT HAND  Final  ? Special Requests   Final  ?  BOTTLES DRAWN AEROBIC AND  ANAEROBIC Blood Culture adequate volume  ? Culture   Final  ?  NO GROWTH < 24 HOURS ?Performed at [facility], [address], [place], [place] [address] ?  ? Report Status PENDING  Incomplete  ?Resp Panel by RT-PCR (Flu

## 2021-05-17 NOTE — H&P (Signed)
Chief Complaint: Patient was seen in consultation today for disc aspiration of T8-T9  at the request of Dr. Hector Shade  Referring Physician(s): Dr. Hector Shade  Supervising Physician: Marliss Coots  Patient Status: [facility] - In-pt  History of Present Illness: Michael [name] is a 61 y.o. male w/ no significant past medical history presented to ED c/o back pain x 2 weeks. Pt was admitted for T8-T9 osteomyelitis. Pt was referred to IR for T8-T9 disc aspiration. This procedure was approved by Dr. Elby Showers.   IMPRESSION: 1. T8-T9 osteomyelitis discitis with mild epidural and paravertebral inflammation. No abscess.  History reviewed. No pertinent past medical history.  History reviewed. No pertinent surgical history.  Allergies: Patient has no known allergies.  Medications: Prior to Admission medications   Medication Sig Start Date End Date Taking? Authorizing Provider  Aspirin-Salicylamide-Caffeine (BC HEADACHE POWDER PO) Take by mouth.   Yes [provider]  calcium carbonate (TUMS - DOSED IN MG ELEMENTAL CALCIUM) 500 MG chewable tablet Chew 1 tablet by mouth daily.   Yes [provider]  esomeprazole (NEXIUM) 20 MG capsule Take 20 mg by mouth daily at 12 noon.   Yes [provider]  naproxen sodium (ALEVE) 220 MG tablet Take 220 mg by mouth daily as needed (pain).   Yes [provider]  ondansetron (ZOFRAN) 4 MG tablet Take by mouth. 05/06/21  Yes [provider]  triamterene-hydrochlorothiazide (MAXZIDE-25) 37.5-25 MG tablet Take 1 tablet by mouth daily. 04/24/21  Yes [provider]  cyclobenzaprine (FLEXERIL) 10 MG tablet Take 1 tablet (10 mg total) by mouth 3 (three) times daily as needed for muscle spasms. Patient not taking: Reported on 05/16/2021 05/08/21   Vanetta Mulders, MD  HYDROcodone-acetaminophen (NORCO/VICODIN) 5-325 MG tablet Take 2 tablets by mouth every 4 (four) hours as needed. Patient not taking: Reported on  05/16/2021 05/08/21   Vanetta Mulders, MD  ondansetron (ZOFRAN-ODT) 4 MG disintegrating tablet Take 1 tablet (4 mg total) by mouth every 8 (eight) hours as needed for nausea or vomiting. Patient not taking: Reported on 05/16/2021 05/08/21   Vanetta Mulders, MD     No family history on file.  Social History   Socioeconomic History   Marital status: Married    Spouse name: Not on file   Number of children: Not on file   Years of education: Not on file   Highest education level: Not on file  Occupational History   Not on file  Tobacco Use   Smoking status: Never   Smokeless tobacco: Never  Substance and Sexual Activity   Alcohol use: Not Currently   Drug use: Not Currently   Sexual activity: Not on file  Other Topics Concern   Not on file  Social History Narrative   Not on file   Social Determinants of Health   Financial Resource Strain: Not on file  Food Insecurity: Not on file  Transportation Needs: Not on file  Physical Activity: Not on file  Stress: Not on file  Social Connections: Not on file    Review of Systems: A 12 point ROS discussed and pertinent positives are indicated in the HPI above.  All other systems are negative.  Review of Systems  Constitutional:  Positive for appetite change. Negative for chills and fever.  Eyes:  Negative for visual disturbance.  Respiratory:  Positive for cough. Negative for shortness of breath.   Cardiovascular:  Negative for chest pain and leg swelling.  Gastrointestinal:  Negative for abdominal pain, nausea  and vomiting.  Musculoskeletal:  Positive for back pain.  Neurological:  Negative for dizziness and headaches.   Vital Signs: BP 133/82 (BP Location: Left Arm)   Pulse 100   Temp 98 F (36.7 C) (Oral)   Resp 18   Ht 6' (1.829 m)   Wt (!) 329 lb (149.2 kg)   SpO2 95%   BMI 44.62 kg/m   Physical Exam Constitutional:      Appearance: He is ill-appearing.  HENT:     Head: Normocephalic and atraumatic.      Mouth/Throat:     Mouth: Mucous membranes are dry.     Pharynx: Oropharynx is clear.  Eyes:     Extraocular Movements: Extraocular movements intact.     Pupils: Pupils are equal, round, and reactive to light.  Cardiovascular:     Rate and Rhythm: Regular rhythm. Tachycardia present.     Pulses: Normal pulses.     Heart sounds: Normal heart sounds. No murmur heard. Pulmonary:     Effort: Pulmonary effort is normal. No respiratory distress.     Breath sounds: Normal breath sounds.  Abdominal:     General: Bowel sounds are normal. There is no distension.     Tenderness: There is no abdominal tenderness. There is no guarding.  Musculoskeletal:        General: No tenderness.     Right lower leg: No edema.     Left lower leg: No edema.     Comments: No tenderness noted to thoracic spine to palpation  Skin:    General: Skin is warm and dry.  Neurological:     Mental Status: He is alert and oriented to person, place, and time.  Psychiatric:        Mood and Affect: Mood normal.        Behavior: Behavior normal.        Thought Content: Thought content normal.        Judgment: Judgment normal.    Imaging: CT Angio Chest PE W and/or Wo Contrast  Result Date: 05/16/2021 CLINICAL DATA:  Intermittent mid to low back pain for the past week. EXAM: CT ANGIOGRAPHY CHEST CT ABDOMEN AND PELVIS WITH CONTRAST TECHNIQUE: Multidetector CT imaging of the chest was performed using the standard protocol during bolus administration of intravenous contrast. Multiplanar CT image reconstructions and MIPs were obtained to evaluate the vascular anatomy. Multidetector CT imaging of the abdomen and pelvis was performed using the standard protocol during bolus administration of intravenous contrast. RADIATION DOSE REDUCTION: This exam was performed according to the departmental dose-optimization program which includes automated exposure control, adjustment of the mA and/or kV according to patient size and/or use of  iterative reconstruction technique. CONTRAST:  OMNIPAQUE IOHEXOL 350 MG/ML SOLN COMPARISON:  CT abdomen pelvis dated May 08, 2021. FINDINGS: CTA CHEST FINDINGS Cardiovascular: Satisfactory opacification of the pulmonary arteries to the segmental level. No evidence of pulmonary embolism. Normal heart size. No pericardial effusion. No thoracic aortic aneurysm or dissection. Mediastinum/Nodes: No enlarged mediastinal, hilar, or axillary lymph nodes. Thyroid gland, trachea, and esophagus demonstrate no significant findings. Lungs/Pleura: No focal consolidation, pleural effusion, or pneumothorax. 4 mm nodule in the right middle lobe (series 5, image 81). Minimal right apical pleuroparenchymal scarring. Minimal bibasilar subsegmental atelectasis. Musculoskeletal: New subtle T9 anterior superior endplate irregularity and lucency (series 7, image 105) with paravertebral inflammatory change at T8-T9 (series 3, image 72; series 6, image 120). Review of the MIP images confirms the above findings. CT ABDOMEN AND  PELVIS FINDINGS Hepatobiliary: No focal liver abnormality. Unchanged tiny gallstones. No gallbladder wall thickening or biliary dilatation. Pancreas: Unremarkable. No pancreatic ductal dilatation or surrounding inflammatory changes. Spleen: Normal in size without focal abnormality. Adrenals/Urinary Tract: Unchanged 1.2 cm right adrenal adenoma. No further imaging follow-up required. Left adrenal gland is unremarkable. No renal mass, calculi, or hydronephrosis. The bladder is decompressed. Stomach/Bowel: Stomach is within normal limits. Appendix appears normal. No evidence of bowel wall thickening, distention, or inflammatory changes. Vascular/Lymphatic: No significant vascular findings are present. No enlarged abdominal or pelvic lymph nodes. Reproductive: Prostate is unremarkable. Other: No free fluid or pneumoperitoneum. Musculoskeletal: No acute or significant osseous findings. Review of the MIP images  confirms the above findings. IMPRESSION: Chest: 1. New subtle T9 anterior superior endplate irregularity and lucency with paravertebral inflammatory change at T8-T9, concerning for acute osteomyelitis discitis. MRI of the thoracic spine with and without contrast is recommended for further evaluation. 2. No acute pulmonary embolism. Abdomen and pelvis: 1. No acute intra-abdominal process. 2. Unchanged cholelithiasis. Electronically Signed   By: Obie Dredge M.D.   On: 05/16/2021 15:31   MR THORACIC SPINE W WO CONTRAST  Result Date: 05/16/2021 CLINICAL DATA:  Intermittent mid to low back pain for the past week. Concern for thoracic osteomyelitis discitis on CT. EXAM: MRI THORACIC WITHOUT AND WITH CONTRAST TECHNIQUE: Multiplanar and multiecho pulse sequences of the thoracic spine were obtained without and with intravenous contrast. CONTRAST:  10mL GADAVIST GADOBUTROL 1 MMOL/ML IV SOLN COMPARISON:  CTA chest from same day. FINDINGS: Alignment:  Physiologic. Vertebrae: T8-T9 endplate marrow edema and enhancement anteriorly. Small amount of fluid in the disc anteriorly (series 20, image 12). Thin circumferential epidural enhancement at T8 and T9. No fracture or bone lesion. Cord:  Normal signal and morphology. Paraspinal and other soft tissues: Paravertebral edema and enhancement at T8-T9. Disc levels: Few scattered small disc protrusions. Mild spinal canal stenosis at T8-T9. No neuroforaminal stenosis at any level. IMPRESSION: 1. T8-T9 osteomyelitis discitis with mild epidural and paravertebral inflammation. No abscess. Electronically Signed   By: Obie Dredge M.D.   On: 05/16/2021 18:18   CT ABDOMEN PELVIS W CONTRAST  Result Date: 05/16/2021 CLINICAL DATA:  Intermittent mid to low back pain for the past week. EXAM: CT ANGIOGRAPHY CHEST CT ABDOMEN AND PELVIS WITH CONTRAST TECHNIQUE: Multidetector CT imaging of the chest was performed using the standard protocol during bolus administration of intravenous  contrast. Multiplanar CT image reconstructions and MIPs were obtained to evaluate the vascular anatomy. Multidetector CT imaging of the abdomen and pelvis was performed using the standard protocol during bolus administration of intravenous contrast. RADIATION DOSE REDUCTION: This exam was performed according to the departmental dose-optimization program which includes automated exposure control, adjustment of the mA and/or kV according to patient size and/or use of iterative reconstruction technique. CONTRAST:  OMNIPAQUE IOHEXOL 350 MG/ML SOLN COMPARISON:  CT abdomen pelvis dated May 08, 2021. FINDINGS: CTA CHEST FINDINGS Cardiovascular: Satisfactory opacification of the pulmonary arteries to the segmental level. No evidence of pulmonary embolism. Normal heart size. No pericardial effusion. No thoracic aortic aneurysm or dissection. Mediastinum/Nodes: No enlarged mediastinal, hilar, or axillary lymph nodes. Thyroid gland, trachea, and esophagus demonstrate no significant findings. Lungs/Pleura: No focal consolidation, pleural effusion, or pneumothorax. 4 mm nodule in the right middle lobe (series 5, image 81). Minimal right apical pleuroparenchymal scarring. Minimal bibasilar subsegmental atelectasis. Musculoskeletal: New subtle T9 anterior superior endplate irregularity and lucency (series 7, image 105) with paravertebral inflammatory change at T8-T9 (series 3,  image 72; series 6, image 120). Review of the MIP images confirms the above findings. CT ABDOMEN AND PELVIS FINDINGS Hepatobiliary: No focal liver abnormality. Unchanged tiny gallstones. No gallbladder wall thickening or biliary dilatation. Pancreas: Unremarkable. No pancreatic ductal dilatation or surrounding inflammatory changes. Spleen: Normal in size without focal abnormality. Adrenals/Urinary Tract: Unchanged 1.2 cm right adrenal adenoma. No further imaging follow-up required. Left adrenal gland is unremarkable. No renal mass, calculi, or  hydronephrosis. The bladder is decompressed. Stomach/Bowel: Stomach is within normal limits. Appendix appears normal. No evidence of bowel wall thickening, distention, or inflammatory changes. Vascular/Lymphatic: No significant vascular findings are present. No enlarged abdominal or pelvic lymph nodes. Reproductive: Prostate is unremarkable. Other: No free fluid or pneumoperitoneum. Musculoskeletal: No acute or significant osseous findings. Review of the MIP images confirms the above findings. IMPRESSION: Chest: 1. New subtle T9 anterior superior endplate irregularity and lucency with paravertebral inflammatory change at T8-T9, concerning for acute osteomyelitis discitis. MRI of the thoracic spine with and without contrast is recommended for further evaluation. 2. No acute pulmonary embolism. Abdomen and pelvis: 1. No acute intra-abdominal process. 2. Unchanged cholelithiasis. Electronically Signed   By: Obie Dredge M.D.   On: 05/16/2021 15:31   CT Renal Stone Study  Result Date: 05/08/2021 CLINICAL DATA:  Right-sided flank pain for several days, initial encounter EXAM: CT ABDOMEN AND PELVIS WITHOUT CONTRAST TECHNIQUE: Multidetector CT imaging of the abdomen and pelvis was performed following the standard protocol without IV contrast. RADIATION DOSE REDUCTION: This exam was performed according to the departmental dose-optimization program which includes automated exposure control, adjustment of the mA and/or kV according to patient size and/or use of iterative reconstruction technique. COMPARISON:  By report from 12/17/2017 FINDINGS: Lower chest: Minimal atelectatic changes are noted. No focal confluent infiltrate is seen. Hepatobiliary: Gallbladder is well distended with dependent gallstones. Mild fatty infiltration of the liver is noted. Pancreas: Unremarkable. No pancreatic ductal dilatation or surrounding inflammatory changes. Spleen: Normal in size without focal abnormality. Adrenals/Urinary Tract:  Adrenal glands show a small 12 mm hypodensity within the right adrenal gland. Left adrenal gland is within normal limits. Kidneys are well visualize without renal calculi or obstructive changes. The bladder is partially distended. Stomach/Bowel: No obstructive or inflammatory changes of the colon are seen. The appendix is within normal limits. Small bowel and stomach are unremarkable. Vascular/Lymphatic: Aortic atherosclerosis. No enlarged abdominal or pelvic lymph nodes. Reproductive: Prostate is unremarkable. Other: No abdominal wall hernia or abnormality. No abdominopelvic ascites. Musculoskeletal: No acute or significant osseous findings. IMPRESSION: 12 mm hypodense nodule within the right adrenal gland. By report this was present on a prior CT from 12/17/2017. Given its stability it is felt to represent a benign adenoma. Cholelithiasis without complicating factors. Fatty infiltration of the liver. Electronically Signed   By: Alcide Clever M.D.   On: 05/08/2021 19:35    Labs:  CBC: Recent Labs    05/08/21 1720 05/16/21 1255  WBC 10.3 19.5*  HGB 15.5 15.0  HCT 47.6 46.6  PLT 231 319    COAGS: No results for input(s): INR, APTT in the last 8760 hours.  BMP: Recent Labs    05/08/21 1720 05/16/21 1255  NA 134* 135  K 3.8 4.3  CL 103 100  CO2 22 25  GLUCOSE 114* 114*  BUN 14 12  CALCIUM 8.5* 9.1  CREATININE 1.18 1.18  GFRNONAA >60 >60    LIVER FUNCTION TESTS: Recent Labs    05/08/21 1720 05/16/21 1255  BILITOT 0.7 1.0  AST 29 26  ALT 39 43  ALKPHOS 75 77  PROT 7.5 8.2*  ALBUMIN 3.3* 3.4*    TUMOR MARKERS: No results for input(s): AFPTM, CEA, CA199, CHROMGRNA in the last 8760 hours.  Assessment and Plan: Pt with no significant past medical history presented to ED c/o back pain x 2 weeks. Pt was admitted for T8-T9 osteomyelitis. Pt was referred to IR for T8-T9 disc aspiration. This procedure was approved by Dr. Elby Showers.   IMPRESSION: 1. T8-T9 osteomyelitis discitis  with mild epidural and paravertebral inflammation. No abscess.   Pt resting in bed. He is A&O, calm and pleasant. Pt wife and mother at bedside.  Pt understands that he will NPO after MN for procedure.  Lovenox does not have to be held.  WBC 19.5, afebrile\\ +BC, gram negative rods   Risks and benefits of T8-T9 disc aspiration was discussed with the patient and/or patient's family including, but not limited to bleeding, infection, damage to adjacent structures or low yield requiring additional tests.  All of the questions were answered and there is agreement to proceed.  Consent signed and in chart.   Thank you for this interesting consult.  I greatly enjoyed meeting Michael [name] and look forward to participating in their care.  A copy of this report was sent to the requesting provider on this date.  Electronically Signed: Shon Hough, NP 05/17/2021, 12:42 PM   I spent a total of 20 minutes in face to face in clinical consultation, greater than 50% of which was counseling/coordinating care for aspiration of T8-T9 disc osteomyelitis.

## 2021-05-18 ENCOUNTER — Inpatient Hospital Stay (HOSPITAL_COMMUNITY): Payer: BC Managed Care – PPO

## 2021-05-18 DIAGNOSIS — R7881 Bacteremia: Secondary | ICD-10-CM | POA: Diagnosis not present

## 2021-05-18 DIAGNOSIS — I10 Essential (primary) hypertension: Secondary | ICD-10-CM | POA: Diagnosis not present

## 2021-05-18 DIAGNOSIS — M8618 Other acute osteomyelitis, other site: Secondary | ICD-10-CM | POA: Diagnosis not present

## 2021-05-18 HISTORY — PX: IR CERVICAL/THORACIC DISC ASPIRATION W/IMAG GUIDE: IMG5397

## 2021-05-18 LAB — BASIC METABOLIC PANEL
Anion gap: 11 (ref 5–15)
BUN: 8 mg/dL (ref 6–20)
CO2: 23 mmol/L (ref 22–32)
Calcium: 8.8 mg/dL — ABNORMAL LOW (ref 8.9–10.3)
Chloride: 98 mmol/L (ref 98–111)
Creatinine, Ser: 1.2 mg/dL (ref 0.61–1.24)
GFR, Estimated: >60 mL/min (ref >60)
Glucose, Bld: 108 mg/dL — ABNORMAL HIGH (ref 70–99)
Potassium: 3.9 mmol/L (ref 3.5–5.1)
Sodium: 132 mmol/L — ABNORMAL LOW (ref 135–145)

## 2021-05-18 LAB — CBC
HCT: 42.7 % (ref 39.0–52.0)
Hemoglobin: 14 g/dL (ref 13.0–17.0)
MCH: 29.3 pg (ref 26.0–34.0)
MCHC: 32.8 g/dL (ref 30.0–36.0)
MCV: 89.3 fL (ref 80.0–100.0)
Platelets: 281 10*3/uL (ref 150–400)
RBC: 4.78 MIL/uL (ref 4.22–5.81)
RDW: 13.5 % (ref 11.5–15.5)
WBC: 13.2 10*3/uL — ABNORMAL HIGH (ref 4.0–10.5)
nRBC: 0 % (ref 0.0–0.2)

## 2021-05-18 IMAGING — XA IR DISC ASPIRATION W/IMAG GUIDE
7 series · 12 of 12 positions shown · non-contrast
Comparison: none

INDICATION: 61-year-old male with T8-T9 osteomyelitis discitis.

[Series 1: body 4 care · 2 of 2 frames shown]
[frame 1/2]
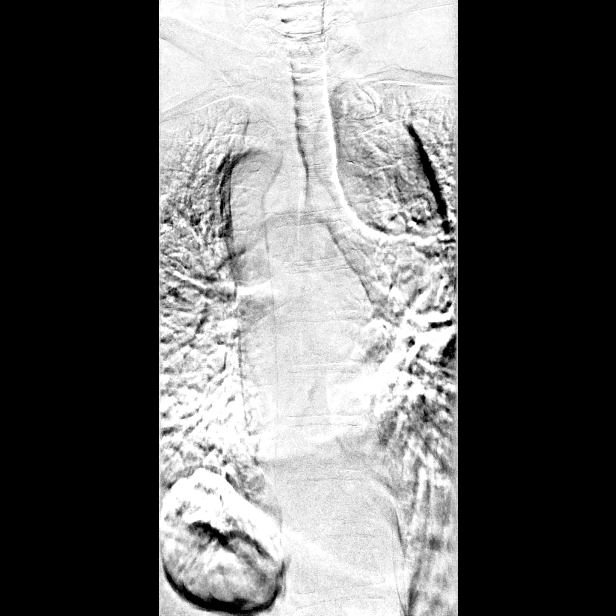
[frame 2/2]
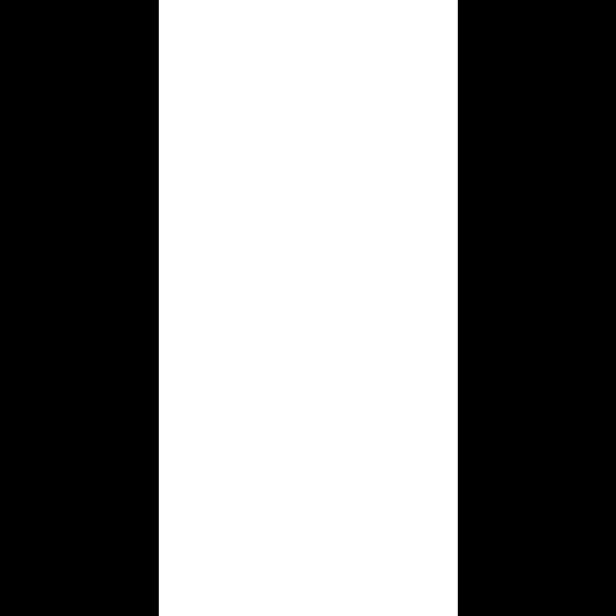

[Series 2: single care · 2 of 2 slices shown]
[im 1/2]
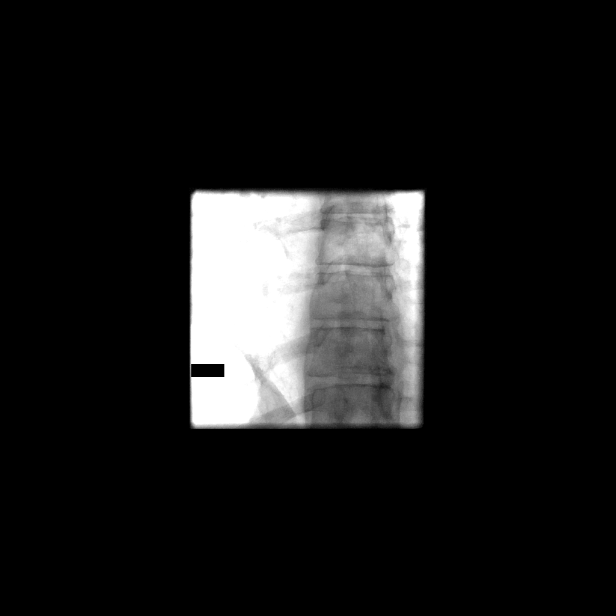
[im 2/2]
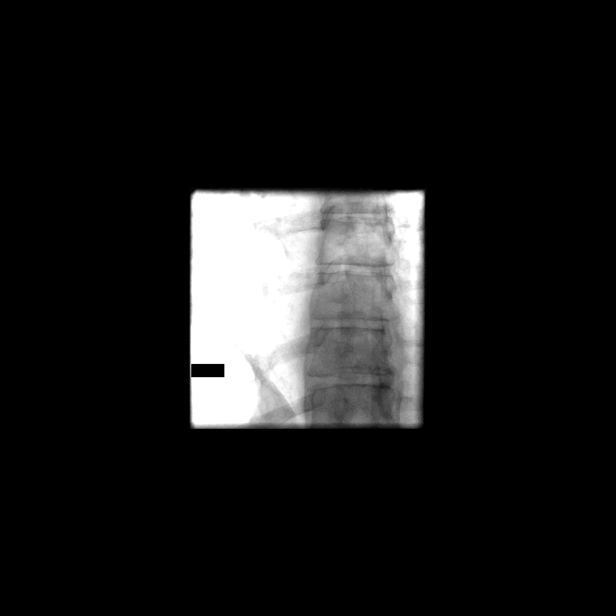

[Series 3: single · 2 of 2 slices shown (1 of 2)]
[im 1/2]
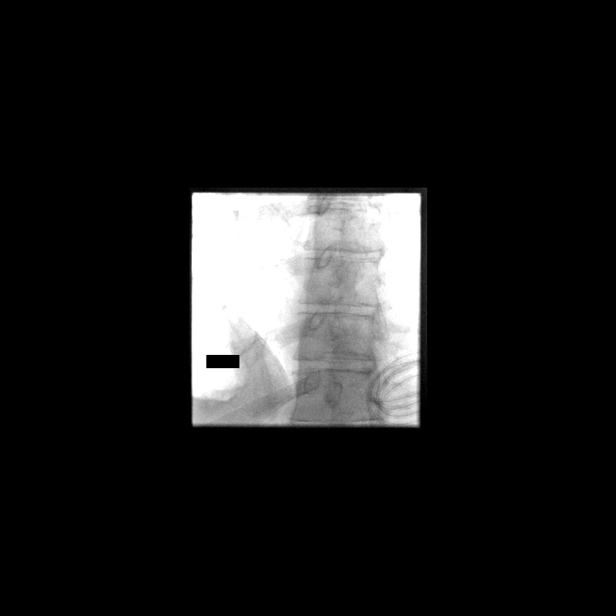
[im 2/2]
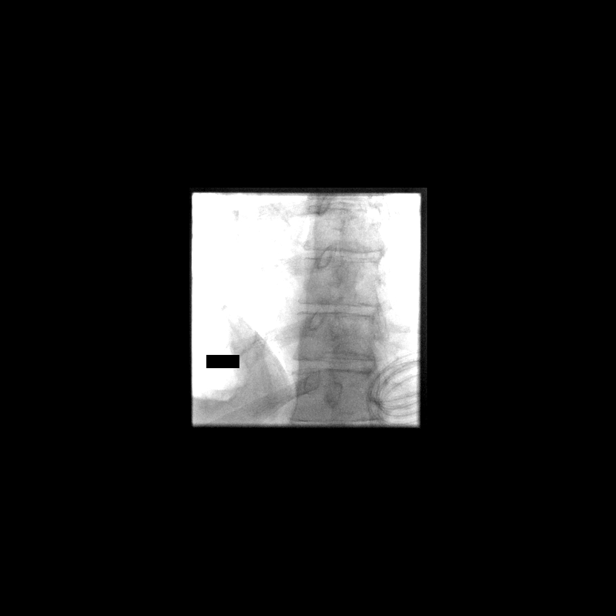

[Series 4: single · 2 of 2 slices shown (2 of 2)]
[im 1/2]
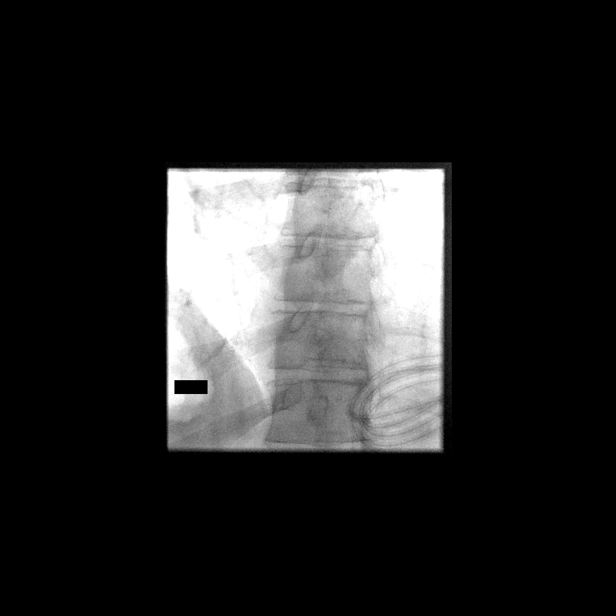
[im 2/2]
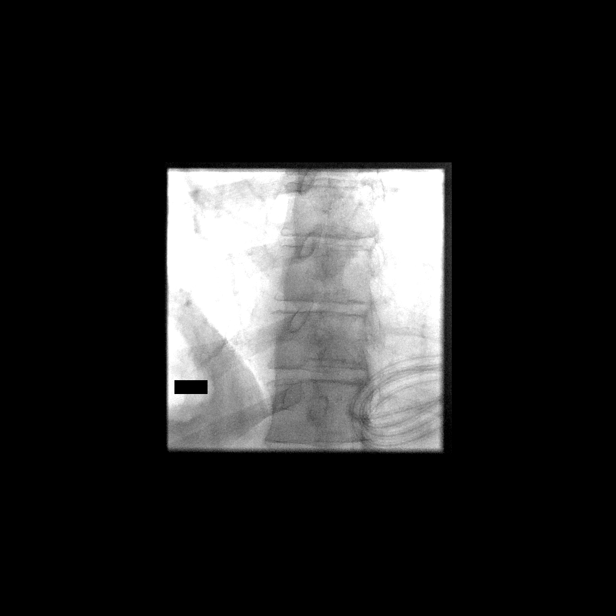

[Series 5: fl (-) angio · 2 of 2 slices shown (1 of 3)]
[im 1/2]
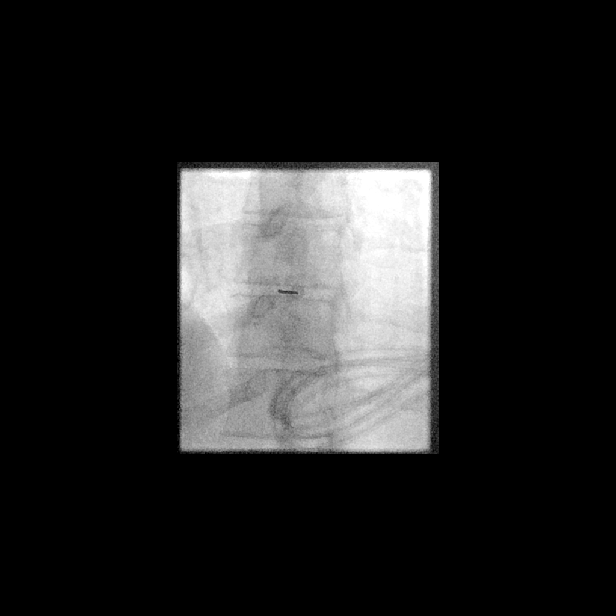
[im 2/2]
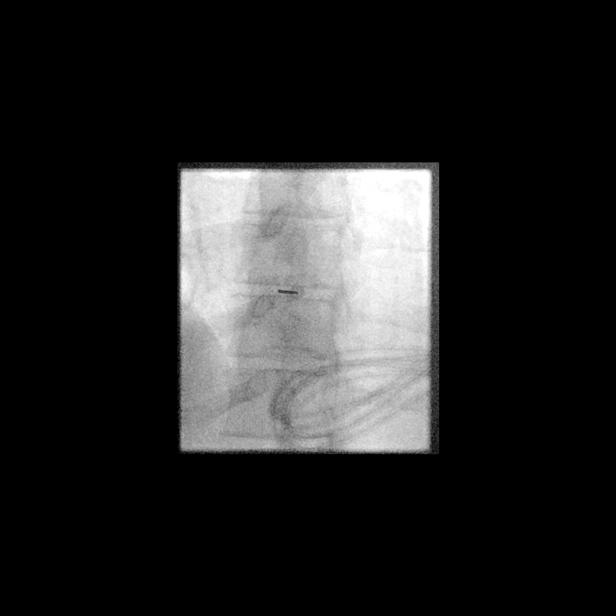

[Series 6: fl (-) angio · 1 of 1 slices shown (2 of 3)]
[im 1/1]
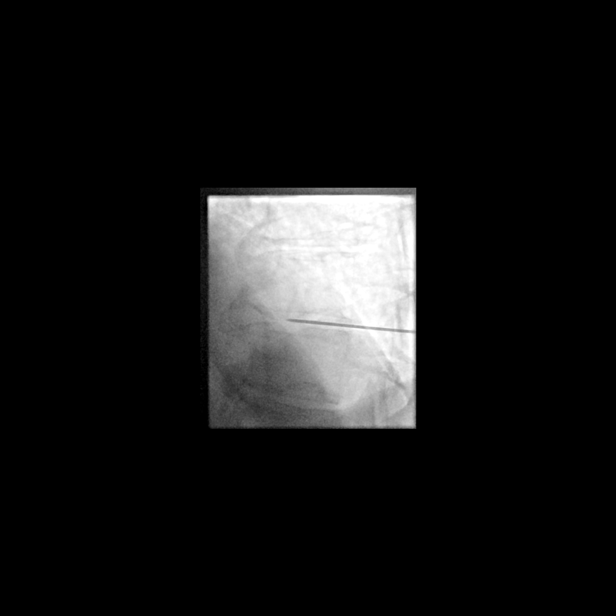

[Series 7: fl (-) angio · 1 of 1 slices shown (3 of 3)]
[im 1/1]
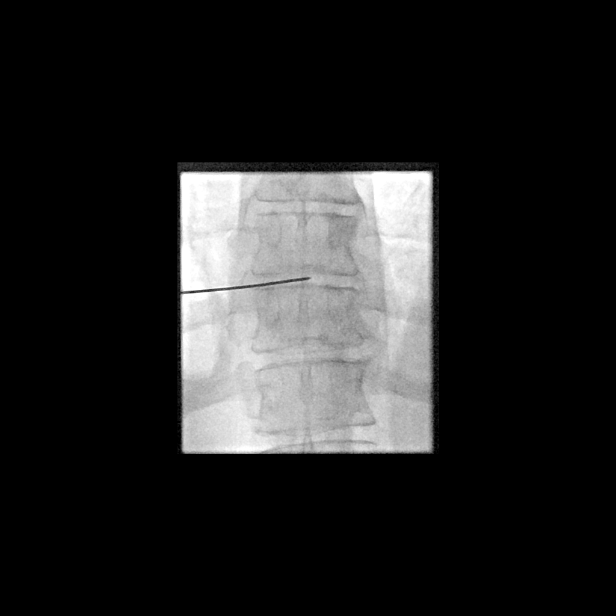

[12 of 12 positions shown; findings below may reference images not displayed]

EXAM:
T8-T9 DISC ASPIRATION UNDER FLUOROSCOPY

MEDICATIONS:
Lidocaine 1% subcutaneous

ANESTHESIA/SEDATION:
Moderate (conscious) sedation was employed during this procedure. A
total of Versed 3 mg and Fentanyl 150 mcg was administered
intravenously.

Moderate Sedation Time: 20 minutes. The patient's level of
consciousness and vital signs were monitored continuously by
radiology nursing throughout the procedure under my direct
supervision.

PROCEDURE:
Informed written consent was obtained from the patient after a
thorough discussion of the procedural risks, benefits and
alternatives. All questions were addressed. Maximal Sterile Barrier
Technique was utilized including caps, mask, sterile gowns, sterile
gloves, sterile drape, hand hygiene and skin antiseptic. A timeout
was performed prior to the initiation of the procedure.

The appropriate interspace was identified under fluoroscopy,
corresponding to previous cross-sectional imaging. An appropriate
skin entry site was determined. After local infiltration with 1%
lidocaine, an 18 gauge trocar needle was advanced into the T8-T9
interspace from left posterolateral extraforaminal approach. Needle
tip position within the interspace confirmed on biplane images.
Trace sanguinous fluid was aspirated, sent for the requested
laboratory studies. The patient tolerated the procedure well.

FLUOROSCOPY TIME:  3.9 minutes, 157 mGy

COMPLICATIONS:
None immediate.
IMPRESSION: Technically successful T8-T9 disc aspiration under fluoroscopy.

## 2021-05-18 MED ORDER — FENTANYL CITRATE (PF) 100 MCG/2ML IJ SOLN
INTRAMUSCULAR | Status: AC
Start: 2021-05-18 — End: 2021-05-19
  Filled 2021-05-18: qty 2

## 2021-05-18 MED ORDER — FENTANYL CITRATE (PF) 100 MCG/2ML IJ SOLN
INTRAMUSCULAR | Status: AC
  Filled 2021-05-18: qty 2

## 2021-05-18 MED ORDER — MIDAZOLAM HCL 2 MG/2ML IJ SOLN
INTRAMUSCULAR | Status: AC
  Filled 2021-05-18: qty 2

## 2021-05-18 MED ORDER — LIDOCAINE HCL (PF) 1 % IJ SOLN
INTRAMUSCULAR | Status: AC | PRN
  Administered 2021-05-18: 10 mL

## 2021-05-18 MED ORDER — FENTANYL CITRATE (PF) 100 MCG/2ML IJ SOLN
INTRAMUSCULAR | Status: AC | PRN
  Administered 2021-05-18 (×2): 50 ug via INTRAVENOUS
  Administered 2021-05-18: 25 ug via INTRAVENOUS

## 2021-05-18 MED ORDER — MIDAZOLAM HCL 2 MG/2ML IJ SOLN
INTRAMUSCULAR | Status: AC | PRN
Start: 2021-05-18 — End: 2021-05-18
  Administered 2021-05-18 (×3): 1 mg via INTRAVENOUS

## 2021-05-18 NOTE — Progress Notes (Signed)
? ?PROGRESS NOTE ? ? ? ?Michael [name]  BHA:[id number] DOB: 09/17/1960 DOA: 05/16/2021 ?PCP: [name], PA ? ? ?Brief Narrative: ?Michael [name] is a 61 y.o. male with a history of hypertension and obesity. Patient presented secondary to back pain and was found to have evidence of vertebral osteomyelitis/discitis. Empiric Vancomycin and Ceftriaxone started. ID consulted. Cultures pending with plan for vertebral disc aspiration. ? ? ?Assessment and Plan: ?* Osteomyelitis (HCC) ?Thoracic location; T8-T9. Noted on MRI. Recent dental infection. Patient started empirically on Vancomycin and Ceftriaxone. IR consulted for disc aspiration. ID consulted. Blood cultures significant for E. Coli. ?-ID recommendations: Vancomycin discontinued. Pending today ?-IR recommendations: Disc aspiration today ?-Continue Ceftriaxone ?-Analgesics for back pain ?-PT/OT for mobility complicated by pain ? ?Gram-negative bacteremia ?Blood cultures with GNR on gram stain. Culture results pending.  ?-See problem, Osteomyelitis ? ?Obesity, Class III, BMI 40-49.9 (morbid obesity) (HCC) ?Body mass index is 44.62 kg/m?. ? ?Essential hypertension ?-Continue home triamterene and hydrochlorothiazide ? ? ? ?DVT prophylaxis: Lovenox ?Code Status:   Code Status: Full Code ?Family Communication: None at bedside ?Disposition Plan: Discharge home pending ID recommendations, including outpatient antibiotic regimen ? ? ?Consultants:  ?Infectious disease ?Interventional radiology ? ?Procedures:  ?None ? ?Antimicrobials: ?Vancomycin ?Ceftriaxone  ? ? ?Subjective: ?Patient is anxious about procedure today. Pain is tolerable with treatment. No other concerns. ? ?Objective: ?BP (!) 127/97   Pulse (!) 110   Temp 98.4 ?F (36.9 ?C) (Oral)   Resp 20   Ht 6' (1.829 m)   Wt (!) 149.2 kg   SpO2 95%   BMI 44.62 kg/m?  ? ?Examination: ? ?General exam: Appears calm and comfortable ?Respiratory system: Clear to auscultation. Respiratory effort  normal. ?Cardiovascular system: S1 & S2 heard, RRR. ?Gastrointestinal system: Abdomen is nondistended, soft and nontender. Normal bowel sounds heard. ?Central nervous system: Alert and oriented. No focal neurological deficits. ?Musculoskeletal:  No calf tenderness ?Skin: No cyanosis. No rashes ?Psychiatry: Judgement and insight appear normal. Mood & affect appropriate.  ? ? ?Data Reviewed: I have personally reviewed following labs and imaging studies ? ?CBC ?Lab Results  ?Component Value Date  ? WBC 13.2 (H) 05/18/2021  ? RBC 4.78 05/18/2021  ? HGB 14.0 05/18/2021  ? HCT 42.7 05/18/2021  ? MCV 89.3 05/18/2021  ? MCH 29.3 05/18/2021  ? PLT 281 05/18/2021  ? MCHC 32.8 05/18/2021  ? RDW 13.5 05/18/2021  ? LYMPHSABS 1.2 05/16/2021  ? MONOABS 1.8 (H) 05/16/2021  ? EOSABS 0.1 05/16/2021  ? BASOSABS 0.1 05/16/2021  ? ? ? ?Last metabolic panel ?Lab Results  ?Component Value Date  ? NA 132 (L) 05/18/2021  ? K 3.9 05/18/2021  ? CL 98 05/18/2021  ? CO2 23 05/18/2021  ? BUN 8 05/18/2021  ? CREATININE 1.20 05/18/2021  ? GLUCOSE 108 (H) 05/18/2021  ? GFRNONAA >60 05/18/2021  ? CALCIUM 8.8 (L) 05/18/2021  ? PROT 8.2 (H) 05/16/2021  ? ALBUMIN 3.4 (L) 05/16/2021  ? BILITOT 1.0 05/16/2021  ? ALKPHOS 77 05/16/2021  ? AST 26 05/16/2021  ? ALT 43 05/16/2021  ? ANIONGAP 11 05/18/2021  ? ? ?GFR: ?Estimated Creatinine Clearance: 99.6 mL/min (by C-G formula based on SCr of 1.2 mg/dL). ? ?Recent Results (from the past 240 hour(s))  ?Resp Panel by RT-PCR (Flu A&B, Covid) Nasopharyngeal Swab     Status: None  ? Collection Time: 05/08/21  6:09 PM  ? Specimen: Nasopharyngeal Swab; Nasopharyngeal(NP) swabs in vial transport medium  ?Result Value Ref Range Status  ?  SARS Coronavirus 2 by RT PCR NEGATIVE NEGATIVE Final  ?  Comment: (NOTE) ?SARS-CoV-2 target nucleic acids are NOT DETECTED. ? ?The SARS-CoV-2 RNA is generally detectable in upper respiratory ?specimens during the acute phase of infection. The lowest ?concentration of SARS-CoV-2 viral  copies this assay can detect is ?138 copies/mL. A negative result does not preclude SARS-Cov-2 ?infection and should not be used as the sole basis for treatment or ?other patient management decisions. A negative result may occur with  ?improper specimen collection/handling, submission of specimen other ?than nasopharyngeal swab, presence of viral mutation(s) within the ?areas targeted by this assay, and inadequate number of viral ?copies(<138 copies/mL). A negative result must be combined with ?clinical observations, patient history, and epidemiological ?information. The expected result is Negative. ? ?Fact Sheet for Patients:  ?BloggerCourse.com ? ?Fact Sheet for Healthcare Providers:  ?SeriousBroker.it ? ?This test is no t yet approved or cleared by the [place] FDA and  ?has been authorized for detection and/or diagnosis of SARS-CoV-2 by ?FDA under an Emergency Use Authorization (EUA). This EUA will remain  ?in effect (meaning this test can be used) for the duration of the ?COVID-19 declaration under Section 564(b)(1) of the Act, 21 ?U.S.C.section 360bbb-3(b)(1), unless the authorization is terminated  ?or revoked sooner.  ? ? ?  ? Influenza A by PCR NEGATIVE NEGATIVE Final  ? Influenza B by PCR NEGATIVE NEGATIVE Final  ?  Comment: (NOTE) ?The Xpert Xpress SARS-CoV-2/FLU/RSV plus assay is intended as an aid ?in the diagnosis of influenza from Nasopharyngeal swab specimens and ?should not be used as a sole basis for treatment. Nasal washings and ?aspirates are unacceptable for Xpert Xpress SARS-CoV-2/FLU/RSV ?testing. ? ?Fact Sheet for Patients: ?BloggerCourse.com ? ?Fact Sheet for Healthcare Providers: ?SeriousBroker.it ? ?This test is not yet approved or cleared by the [place] FDA and ?has been authorized for detection and/or diagnosis of SARS-CoV-2 by ?FDA under an Emergency Use Authorization (EUA). This  EUA will remain ?in effect (meaning this test can be used) for the duration of the ?COVID-19 declaration under Section 564(b)(1) of the Act, 21 U.S.C. ?section 360bbb-3(b)(1), unless the authorization is terminated or ?revoked. ? ?Performed at [facility], [address], [place], [place] [address] ?  ?Blood culture (routine x 2)     Status: Abnormal (Preliminary result)  ? Collection Time: 05/16/21  6:00 PM  ? Specimen: Right Antecubital; Blood  ?Result Value Ref Range Status  ? Specimen Description   Final  ?  RIGHT ANTECUBITAL ?Performed at [facility], [address] [place], [place] [address] ?  ? Special Requests   Final  ?  BOTTLES DRAWN AEROBIC AND ANAEROBIC Blood Culture adequate volume ?Performed at [facility], [address] [place], [place] [address] ?  ? Culture  Setup Time   Final  ?  GRAM NEGATIVE RODS ?IN BOTH AEROBIC AND ANAEROBIC BOTTLES ?Gram Stain Report Called to,Read Back By and Verified With: KUFFOR @ 1220 ON [id number] BY HENDERSON L ?Organism ID to follow ?  ? Culture (A)  Final  ?  ESCHERICHIA COLI ?SUSCEPTIBILITIES TO FOLLOW ?Performed at [facility], [address] [place], [place] [address] ?  ? Report Status PENDING  Incomplete  ?Blood culture (routine x 2)     Status: Abnormal (Preliminary result)  ? Collection Time: 05/16/21  6:00 PM  ? Specimen: BLOOD LEFT HAND  ?Result Value Ref Range Status  ? Specimen Description   Final  ?  BLOOD LEFT HAND ?Performed at [facility], [address] [place], [place] [address] ?  ? Special Requests   Final  ?  BOTTLES DRAWN AEROBIC AND ANAEROBIC Blood Culture adequate volume ?Performed at [facility], [address] [place], [place] [address] ?  ? Culture  Setup Time   Final  ?  AEROBIC BOTTLE ONLY GRAM NEGATIVE RODS ?GRAM STAIN PREVIOUSLY CALLED ?Performed at [facility], [address] [place], [place] [address] ?  ? Culture ESCHERICHIA COLI (A)  Final  ? Report Status PENDING  Incomplete  ?Blood Culture ID Panel (Reflexed)      Status: Abnormal  ? Collection Time: 05/16/21  6:00 PM  ?Result Value Ref Range Status  ? Enterococcus faecalis NOT DETECTED NOT DETECTED Final  ? Enterococcus Faecium NOT DETECTED NOT DETECTED Final  ? Listeria monocytogenes NO

## 2021-05-18 NOTE — Procedures (Signed)
Interventional Radiology Procedure Note ? ?Procedure: Fluoroscopic guided T8-T9 disc aspiration ? ?Findings: Please refer to procedural dictation for full description. Scant fluid aspirated.  Sample sent for culture. ? ?Complications: None immediate ? ?Estimated Blood Loss: < 5 mL ? ?Recommendations: ?Follow up culture, may be low yield given absence of /scant fluid in disc. ? ? ?Marliss Coots, MD ?Pager: [phone] ? ? ? ?

## 2021-05-19 ENCOUNTER — Inpatient Hospital Stay: Payer: Self-pay

## 2021-05-19 DIAGNOSIS — M8618 Other acute osteomyelitis, other site: Secondary | ICD-10-CM | POA: Diagnosis not present

## 2021-05-19 DIAGNOSIS — R7881 Bacteremia: Secondary | ICD-10-CM | POA: Diagnosis not present

## 2021-05-19 LAB — CULTURE, BLOOD (ROUTINE X 2)
Special Requests: ADEQUATE
Special Requests: ADEQUATE

## 2021-05-19 LAB — CBC
HCT: 44.6 % (ref 39.0–52.0)
Hemoglobin: 15.2 g/dL (ref 13.0–17.0)
MCH: 29.9 pg (ref 26.0–34.0)
MCHC: 34.1 g/dL (ref 30.0–36.0)
MCV: 87.8 fL (ref 80.0–100.0)
Platelets: 299 10*3/uL (ref 150–400)
RBC: 5.08 MIL/uL (ref 4.22–5.81)
RDW: 13.4 % (ref 11.5–15.5)
WBC: 10.8 10*3/uL — ABNORMAL HIGH (ref 4.0–10.5)
nRBC: 0 % (ref 0.0–0.2)

## 2021-05-19 MED ORDER — CEFAZOLIN SODIUM-DEXTROSE 2-4 GM/100ML-% IV SOLN
2.0000 g | Freq: Three times a day (TID) | INTRAVENOUS | Status: DC
  Administered 2021-05-19 – 2021-05-21 (×7): 2 g via INTRAVENOUS
  Filled 2021-05-19 (×7): qty 100

## 2021-05-19 NOTE — Progress Notes (Signed)
PROGRESS NOTE  Michael [name] QIO:[id number] DOB: 1960/12/14 DOA: 05/16/2021 PCP: [name], PA   LOS: 3 days   Brief Narrative / Interim history: Michael [name] is a 61 y.o. male with a history of hypertension and obesity. Patient presented secondary to back pain and was found to have evidence of vertebral osteomyelitis/discitis. Empiric Vancomycin and Ceftriaxone started. ID consulted. Cultures pending with plan for vertebral disc aspiration.  Subjective / 24h Interval events: Doing well this morning, states that his back pain is better.  He is walking with PT this morning  Assesement and Plan: Principal Problem:   Osteomyelitis (HCC) Active Problems:   Gram-negative bacteremia   Essential hypertension   Obesity, Class III, BMI 40-49.9 (morbid obesity) (HCC)  Assessment and Plan: Principal problem Osteomyelitis (HCC) -Thoracic location; T8-T9. Noted on MRI. Recent dental infection.  He was initially started on broad-spectrum antibiotics with ceftriaxone.  ID consulted.  IR consulted as well and underwent disc aspiration 3/19.  Cultures are pending.  Currently on cefazolin.  Place PICC line per ID as he will need 6 weeks of antibiotics  Active problems E. coli bacteremia-may or may not be related to his osteomyelitis, E. coli is pansensitive, continue cefazolin per ID  Obesity, Class III, BMI 40-49.9 (morbid obesity) (HCC) -Body mass index is 44.62 kg/m.  Essential hypertension-Continue home triamterene and hydrochlorothiazide  Scheduled Meds:  enoxaparin (LOVENOX) injection  75 mg Subcutaneous Q24H   pantoprazole  40 mg Oral Daily   triamterene-hydrochlorothiazide  1 tablet Oral Daily   Continuous Infusions:   ceFAZolin (ANCEF) IV     PRN Meds:.acetaminophen **OR** acetaminophen, HYDROmorphone (DILAUDID) injection, ondansetron (ZOFRAN) IV, oxyCODONE  Diet Orders (From admission, onward)     Start     Ordered   05/18/21 1347  Diet Heart Room service appropriate?  Yes; Fluid consistency: Thin  Diet effective now       Question Answer Comment  Room service appropriate? Yes   Fluid consistency: Thin      05/18/21 1347            DVT prophylaxis: SCDs Start: 05/16/21 2125   Lab Results  Component Value Date   PLT 299 05/19/2021      Code Status: Full Code  Family Communication: No family at bedside  Status is: Inpatient  Remains inpatient appropriate because: Awaiting cultures  Level of care: Med-Surg  Consultants:  IR ID   Objective: Vitals:   05/18/21 1934 05/18/21 2313 05/19/21 0410 05/19/21 0745  BP: (!) 124/91 129/86 127/78 (!) 134/92  Pulse: 99 97 89   Resp: 20 20 (!) 21 20  Temp: 97.8 F (36.6 C) 98.3 F (36.8 C) 98.4 F (36.9 C) 97.7 F (36.5 C)  TempSrc: Oral Oral Oral Oral  SpO2: 93% 94% 94%   Weight:      Height:        Intake/Output Summary (Last 24 hours) at 05/19/2021 1205 Last data filed at 05/19/2021 0900 Gross per 24 hour  Intake 700 ml  Output 2425 ml  Net -1725 ml   Wt Readings from Last 3 Encounters:  05/16/21 (!) 149.2 kg  05/08/21 (!) 149.7 kg  04/26/14 128.4 kg    Examination:  Constitutional: NAD Eyes: no scleral icterus ENMT: Mucous membranes are moist.  Neck: normal, supple Respiratory: clear to auscultation bilaterally, no wheezing, no crackles.  Cardiovascular: Regular rate and rhythm, no murmurs / rubs / gallops. Trace edema Abdomen: non distended, no tenderness. Bowel sounds positive.  Musculoskeletal: no  clubbing / cyanosis.  Skin: no rashes Neurologic: non focal    Data Reviewed: I have independently reviewed following labs and imaging studies  CBC Recent Labs  Lab 05/16/21 1255 05/17/21 0509 05/18/21 0430 05/19/21 0748  WBC 19.5* 17.1* 13.2* 10.8*  HGB 15.0 14.0 14.0 15.2  HCT 46.6 41.8 42.7 44.6  PLT 319 273 281 299  MCV 92.1 90.7 89.3 87.8  MCH 29.6 30.4 29.3 29.9  MCHC 32.2 33.5 32.8 34.1  RDW 14.0 13.9 13.5 13.4  LYMPHSABS 1.2  --   --   --    MONOABS 1.8*  --   --   --   EOSABS 0.1  --   --   --   BASOSABS 0.1  --   --   --     Recent Labs  Lab 05/16/21 1255 05/16/21 1800 05/16/21 2020 05/17/21 0509 05/18/21 0430  NA 135  --   --  136 132*  K 4.3  --   --  4.2 3.9  CL 100  --   --  103 98  CO2 25  --   --  24 23  GLUCOSE 114*  --   --  111* 108*  BUN 12  --   --  8 8  CREATININE 1.18  --   --  1.25* 1.20  CALCIUM 9.1  --   --  8.8* 8.8*  AST 26  --   --   --   --   ALT 43  --   --   --   --   ALKPHOS 77  --   --   --   --   BILITOT 1.0  --   --   --   --   ALBUMIN 3.4*  --   --   --   --   CRP  --  13.7*  --   --   --   LATICACIDVEN  --   --  1.0  --   --     ------------------------------------------------------------------------------------------------------------------ No results for input(s): CHOL, HDL, LDLCALC, TRIG, CHOLHDL, LDLDIRECT in the last 72 hours.  No results found for: HGBA1C ------------------------------------------------------------------------------------------------------------------ No results for input(s): TSH, T4TOTAL, T3FREE, THYROIDAB in the last 72 hours.  Invalid input(s): FREET3  Cardiac Enzymes No results for input(s): CKMB, TROPONINI, MYOGLOBIN in the last 168 hours.  Invalid input(s): CK ------------------------------------------------------------------------------------------------------------------ No results found for: BNP  CBG: No results for input(s): GLUCAP in the last 168 hours.  Recent Results (from the past 240 hour(s))  Blood culture (routine x 2)     Status: Abnormal   Collection Time: 05/16/21  6:00 PM   Specimen: Right Antecubital; Blood  Result Value Ref Range Status   Specimen Description   Final    RIGHT ANTECUBITAL Performed at [facility], [address], [place], [place] [address]    Special Requests   Final    BOTTLES DRAWN AEROBIC AND ANAEROBIC Blood Culture adequate volume Performed at [facility], [address] [place], [place]  [address]    Culture  Setup Time   Final    GRAM NEGATIVE RODS IN BOTH AEROBIC AND ANAEROBIC BOTTLES Gram Stain Report Called to,Read Back By and Verified With: KUFFOR @ 1220 ON [id number] BY HENDERSON L Organism ID to follow Performed at [facility], [address], [place], [place] [address]    Culture ESCHERICHIA COLI (A)  Final   Report Status 05/19/2021 FINAL  Final   Organism ID, Bacteria ESCHERICHIA  COLI  Final      Susceptibility   Escherichia coli - MIC*    AMPICILLIN 4 SENSITIVE Sensitive     CEFAZOLIN <=4 SENSITIVE Sensitive     CEFEPIME <=0.12 SENSITIVE Sensitive     CEFTAZIDIME <=1 SENSITIVE Sensitive     CEFTRIAXONE <=0.25 SENSITIVE Sensitive     CIPROFLOXACIN <=0.25 SENSITIVE Sensitive     GENTAMICIN <=1 SENSITIVE Sensitive     IMIPENEM <=0.25 SENSITIVE Sensitive     TRIMETH/SULFA <=20 SENSITIVE Sensitive     AMPICILLIN/SULBACTAM <=2 SENSITIVE Sensitive     PIP/TAZO <=4 SENSITIVE Sensitive     * ESCHERICHIA COLI  Blood culture (routine x 2)     Status: Abnormal   Collection Time: 05/16/21  6:00 PM   Specimen: BLOOD LEFT HAND  Result Value Ref Range Status   Specimen Description   Final    BLOOD LEFT HAND Performed at [facility], [address], [place], [place] [address]    Special Requests   Final    BOTTLES DRAWN AEROBIC AND ANAEROBIC Blood Culture adequate volume Performed at [facility], [address] [place], [place] [address]    Culture  Setup Time   Final    AEROBIC BOTTLE ONLY GRAM NEGATIVE RODS GRAM STAIN PREVIOUSLY CALLED    Culture (A)  Final    ESCHERICHIA COLI SUSCEPTIBILITIES PERFORMED ON PREVIOUS CULTURE WITHIN THE LAST 5 DAYS. Performed at [facility], [address], [place], [place] [address]    Report Status 05/19/2021 FINAL  Final  Blood Culture ID Panel (Reflexed)     Status: Abnormal   Collection Time: 05/16/21  6:00 PM  Result Value Ref Range Status   Enterococcus faecalis NOT DETECTED NOT DETECTED Final   Enterococcus  Faecium NOT DETECTED NOT DETECTED Final   Listeria monocytogenes NOT DETECTED NOT DETECTED Final   Staphylococcus species NOT DETECTED NOT DETECTED Final   Staphylococcus aureus (BCID) NOT DETECTED NOT DETECTED Final   Staphylococcus epidermidis NOT DETECTED NOT DETECTED Final   Staphylococcus lugdunensis NOT DETECTED NOT DETECTED Final   Streptococcus species NOT DETECTED NOT DETECTED Final   Streptococcus agalactiae NOT DETECTED NOT DETECTED Final   Streptococcus pneumoniae NOT DETECTED NOT DETECTED Final   Streptococcus pyogenes NOT DETECTED NOT DETECTED Final   A.calcoaceticus-baumannii NOT DETECTED NOT DETECTED Final   Bacteroides fragilis NOT DETECTED NOT DETECTED Final   Enterobacterales DETECTED (A) NOT DETECTED Final    Comment: Enterobacterales represent a large order of gram negative bacteria, not a single organism. CRITICAL RESULT CALLED TO, READ BACK BY AND VERIFIED WITH: K. HURTH PHARMD, AT 1711 05/17/21 BY D. VANHOOK    Enterobacter cloacae complex NOT DETECTED NOT DETECTED Final   Escherichia coli DETECTED (A) NOT DETECTED Final    Comment: CRITICAL RESULT CALLED TO, READ BACK BY AND VERIFIED WITH: K. HURTH PHARMD, AT 1711 05/17/21 BY D. VANHOOK    Klebsiella aerogenes NOT DETECTED NOT DETECTED Final   Klebsiella oxytoca NOT DETECTED NOT DETECTED Final   Klebsiella pneumoniae NOT DETECTED NOT DETECTED Final   Proteus species NOT DETECTED NOT DETECTED Final   Salmonella species NOT DETECTED NOT DETECTED Final   Serratia marcescens NOT DETECTED NOT DETECTED Final   Haemophilus influenzae NOT DETECTED NOT DETECTED Final   Neisseria meningitidis NOT DETECTED NOT DETECTED Final   Pseudomonas aeruginosa NOT DETECTED NOT DETECTED Final   Stenotrophomonas maltophilia NOT DETECTED NOT DETECTED Final   Candida albicans NOT DETECTED NOT DETECTED Final   Candida auris NOT DETECTED  NOT DETECTED Final   Candida glabrata NOT DETECTED NOT DETECTED Final   Candida krusei NOT DETECTED  NOT DETECTED Final   Candida parapsilosis NOT DETECTED NOT DETECTED Final   Candida tropicalis NOT DETECTED NOT DETECTED Final   Cryptococcus neoformans/gattii NOT DETECTED NOT DETECTED Final   CTX-M ESBL NOT DETECTED NOT DETECTED Final   Carbapenem resistance IMP NOT DETECTED NOT DETECTED Final   Carbapenem resistance KPC NOT DETECTED NOT DETECTED Final   Carbapenem resistance NDM NOT DETECTED NOT DETECTED Final   Carbapenem resist OXA 48 LIKE NOT DETECTED NOT DETECTED Final   Carbapenem resistance VIM NOT DETECTED NOT DETECTED Final    Comment: Performed at [facility], [address] [place], [place] [address]  Resp Panel by RT-PCR (Flu A&B, Covid) Nasopharyngeal Swab     Status: None   Collection Time: 05/16/21  7:30 PM   Specimen: Nasopharyngeal Swab; Nasopharyngeal(NP) swabs in vial transport medium  Result Value Ref Range Status   SARS Coronavirus 2 by RT PCR NEGATIVE NEGATIVE Final    Comment: (NOTE) SARS-CoV-2 target nucleic acids are NOT DETECTED.  The SARS-CoV-2 RNA is generally detectable in upper respiratory specimens during the acute phase of infection. The lowest concentration of SARS-CoV-2 viral copies this assay can detect is 138 copies/mL. A negative result does not preclude SARS-Cov-2 infection and should not be used as the sole basis for treatment or other patient management decisions. A negative result may occur with  improper specimen collection/handling, submission of specimen other than nasopharyngeal swab, presence of viral mutation(s) within the areas targeted by this assay, and inadequate number of viral copies(<138 copies/mL). A negative result must be combined with clinical observations, patient history, and epidemiological information. The expected result is Negative.  Fact Sheet for Patients:  BloggerCourse.com  Fact Sheet for Healthcare Providers:  SeriousBroker.it  This test is no t yet  approved or cleared by the [place] FDA and  has been authorized for detection and/or diagnosis of SARS-CoV-2 by FDA under an Emergency Use Authorization (EUA). This EUA will remain  in effect (meaning this test can be used) for the duration of the COVID-19 declaration under Section 564(b)(1) of the Act, 21 U.S.C.section 360bbb-3(b)(1), unless the authorization is terminated  or revoked sooner.       Influenza A by PCR NEGATIVE NEGATIVE Final   Influenza B by PCR NEGATIVE NEGATIVE Final    Comment: (NOTE) The Xpert Xpress SARS-CoV-2/FLU/RSV plus assay is intended as an aid in the diagnosis of influenza from Nasopharyngeal swab specimens and should not be used as a sole basis for treatment. Nasal washings and aspirates are unacceptable for Xpert Xpress SARS-CoV-2/FLU/RSV testing.  Fact Sheet for Patients: BloggerCourse.com  Fact Sheet for Healthcare Providers: SeriousBroker.it  This test is not yet approved or cleared by the [place] FDA and has been authorized for detection and/or diagnosis of SARS-CoV-2 by FDA under an Emergency Use Authorization (EUA). This EUA will remain in effect (meaning this test can be used) for the duration of the COVID-19 declaration under Section 564(b)(1) of the Act, 21 U.S.C. section 360bbb-3(b)(1), unless the authorization is terminated or revoked.  Performed at [facility], [address], [place], [place] [address]   Aerobic/Anaerobic Culture w Gram Stain (surgical/deep wound)     Status: None (Preliminary result)   Collection Time: 05/18/21 12:24 PM   Specimen: Abscess  Result Value Ref Range Status   Specimen Description ABSCESS  Final   Special Requests INTERVERTEBRAL DISC T8/T9  Final  Gram Stain   Final    FEW WBC PRESENT, PREDOMINANTLY MONONUCLEAR FEW GRAM POSITIVE COCCOBACILLUS FEW GRAM NEGATIVE RODS Performed at [facility], [address] [place], [place]  [address]    Culture PENDING  Incomplete   Report Status PENDING  Incomplete     Radiology Studies: [place] EKG SITE RITE  Result Date: 05/19/2021 If Site Rite image not attached, placement could not be confirmed due to current cardiac rhythm.    Pamella Pert, MD, PhD [facility]  Between 7 am - 7 pm I am available, please contact me via Amion (for emergencies) or Securechat (non urgent messages)  Between 7 pm - 7 am I am not available, please contact night coverage MD/APP via Amion

## 2021-05-19 NOTE — Progress Notes (Addendum)
Mobility Specialist: Progress Note ? ? 05/19/21 1312  ?Mobility  ?Activity Ambulated with assistance in hallway  ?Level of Assistance Standby assist, set-up cues, supervision of patient - no hands on  ?Assistive Device Front wheel walker  ?Distance Ambulated (ft) 320 ft  ?Activity Response Tolerated well  ?$Mobility charge 1 Mobility  ? ?Pt received in bed and agreeable to ambulation. C/o back pain upon standing but no c/o pain during ambulation. Pt to bed after session per request with call bell and phone at his side. Pt's wife present in the room.  ? ?Michael [name] ?Mobility Specialist ?Mobility Specialist [place]: [phone] ?Mobility Specialist [place]: [phone] ? ?

## 2021-05-19 NOTE — Evaluation (Signed)
Physical Therapy Evaluation ?Patient Details ?Name: [facility] ?MRN: [id number] ?DOB: April 01, 1960 ?Today's Date: 05/19/2021 ? ?History of Present Illness ? Michael [name] is a 61 y.o. male who presented to ED c/o back pain x 2 weeks. Pt was admitted for T8-T9 osteomyelitis. PMH: HTN, obesity, GERD ?  ?Clinical Impression ? Pt admitted with above. Pt reluctant to move due to anticipation of back pain however agreed to try. Pt was able to transfer to EOB and ambulate with verbal cues and min guard. Pt's pain stayed at 4/10 without onset of sharp shooting pain he had been experiencing for the last 2 weeks. Aware pt is on pain medicine regimen and anti-biotics which seem to be helping. Suspect pt to progress well. Pt does benefit from bariatric RW for safe ambulation at this time to promote optimal body mechanics, support, and pain relief. Acute PT to cont to follow.   ?   ? ?Recommendations for follow up therapy are one component of a multi-disciplinary discharge planning process, led by the attending physician.  Recommendations may be updated based on patient status, additional functional criteria and insurance authorization. ? ?Follow Up Recommendations Home health PT (may progress well enough and not need it) ? ?  ?Assistance Recommended at Discharge Frequent or constant Supervision/Assistance  ?Patient can return home with the following ? A little help with walking and/or transfers;A little help with bathing/dressing/bathroom;Assistance with cooking/housework;Assist for transportation;Help with stairs or ramp for entrance ? ?  ?Equipment Recommendations Rolling walker (2 wheels) (bariatric)  ?Recommendations for Other Services ?    ?  ?Functional Status Assessment Patient has had a recent decline in their functional status and demonstrates the ability to make significant improvements in function in a reasonable and predictable amount of time.  ? ?  ?Precautions / Restrictions Precautions ?Precautions:  Fall ?Precaution Comments: morbid obesity, excruciating back pain ?Restrictions ?Weight Bearing Restrictions: No ?Other Position/Activity Restrictions: self limiting due to back pain  ? ?  ? ?Mobility ? Bed Mobility ?Overal bed mobility: Needs Assistance ?Bed Mobility: Rolling, Sidelying to Sit ?Rolling: Min guard (max verbal cues) ?Sidelying to sit: Min guard ?  ?  ?  ?General bed mobility comments: max verbal cues for log rolling and reaching for bed rail vs trying to pull self up via PT, pt able to move LEs off EOB, increased time but was able to push self up with R UE from sidelying to EOB, pt guarded and cautious due to anticipation of sharp pain he has been experiencing for 2 weeks ?  ? ?Transfers ?Overall transfer level: Needs assistance ?Equipment used: Rolling walker (2 wheels) ?Transfers: Sit to/from Stand ?Sit to Stand: Min guard ?  ?  ?  ?  ?  ?General transfer comment: verbal cues for safety, bed elevated, increased time, verbal cues for hand placement ?  ? ?Ambulation/Gait ?Ambulation/Gait assistance: Min guard, +2 safety/equipment (2nd person for chair follow) ?Gait Distance (Feet): 120 Feet ?Assistive device: Rolling walker (2 wheels) ?Gait Pattern/deviations: Step-through pattern, Decreased stride length ?Gait velocity: dec ?Gait velocity interpretation: <1.31 ft/sec, indicative of household ambulator ?  ?General Gait Details: slow and guarded due to anticipation of onset of sharp shooting pain however pt denied onset, verbal cues for safe walker management when turning to sit, no physical assist needed ? ?Stairs ?  ?  ?  ?  ?  ? ?Wheelchair Mobility ?  ? ?Modified Rankin (Stroke Patients Only) ?  ? ?  ? ?Balance Overall balance assessment: Mild deficits observed,  not formally tested ?  ?  ?  ?  ?  ?  ?  ?  ?  ?  ?  ?  ?  ?  ?  ?  ?  ?  ?   ? ? ? ?Pertinent Vitals/Pain Pain Assessment ?Pain Assessment: 0-10 ?Pain Score: 4  (8-9 with movement) ?Pain Location: mid back ?Pain Descriptors / Indicators:  Sharp ?Pain Intervention(s): Premedicated before session  ? ? ?Home Living Family/patient expects to be discharged to:: Private residence ?Living Arrangements: Spouse/significant other ?Available Help at Discharge: Family;Available PRN/intermittently (wife works druing the day) ?Type of Home: House ?Home Access: Stairs to enter ?Entrance Stairs-Rails: Can reach both ?Entrance Stairs-Number of Steps: 7 ?  ?Home Layout: One level ?Home Equipment: Agricultural consultant (2 wheels);Shower seat - built in ?   ?  ?Prior Function Prior Level of Function : Working/employed;Independent/Modified Independent ?  ?  ?  ?  ?  ?  ?Mobility Comments: indep up until 2 weeks ago, pt traveled for work, now "I push through the pain to go to the bathroom" ?ADLs Comments: was indep up until 2 weeks ago ?  ? ? ?Hand Dominance  ? Dominant Hand: Left ? ?  ?Extremity/Trunk Assessment  ? Upper Extremity Assessment ?Upper Extremity Assessment: Overall WFL for tasks assessed ?  ? ?Lower Extremity Assessment ?Lower Extremity Assessment: Overall WFL for tasks assessed ?  ? ?Cervical / Trunk Assessment ?Cervical / Trunk Assessment: Other exceptions ?Cervical / Trunk Exceptions: mid back pain  ?Communication  ? Communication:  (muffled speech)  ?Cognition Arousal/Alertness: Awake/alert ?Behavior During Therapy: Flat affect ?Overall Cognitive Status: Within Functional Limits for tasks assessed ?  ?  ?  ?  ?  ?  ?  ?  ?  ?  ?  ?  ?  ?  ?  ?  ?General Comments: pt very guarded due to pain, flat affect, muffled speech making it difficult to understand ?  ?  ? ?  ?General Comments General comments (skin integrity, edema, etc.): pt with dressing over IR site in mid back, BP 136/94 - RN present and aware, gave medicine ? ?  ?Exercises    ? ?Assessment/Plan  ?  ?PT Assessment Patient needs continued PT services  ?PT Problem List Decreased strength;Decreased activity tolerance;Decreased balance;Decreased mobility;Decreased knowledge of use of DME;Decreased safety  awareness ? ?   ?  ?PT Treatment Interventions DME instruction;Gait training;Stair training;Functional mobility training;Therapeutic activities;Therapeutic exercise;Balance training   ? ?PT Goals (Current goals can be found in the Care Plan section)  ?Acute Rehab PT Goals ?Patient Stated Goal: stop the pain ?PT Goal Formulation: With patient ?Time For Goal Achievement: 06/02/21 ?Potential to Achieve Goals: Good ? ?  ?Frequency Min 3X/week ?  ? ? ?Co-evaluation   ?  ?  ?  ?  ? ? ?  ?AM-PAC PT "6 Clicks" Mobility  ?Outcome Measure Help needed turning from your back to your side while in a flat bed without using bedrails?: A Little ?Help needed moving from lying on your back to sitting on the side of a flat bed without using bedrails?: A Little ?Help needed moving to and from a bed to a chair (including a wheelchair)?: A Little ?Help needed standing up from a chair using your arms (e.g., wheelchair or bedside chair)?: A Little ?Help needed to walk in hospital room?: A Little ?Help needed climbing 3-5 steps with a railing? : A Little ?6 Click Score: 18 ? ?  ?End of Session Equipment  Utilized During Treatment: Gait belt ?Activity Tolerance: Patient tolerated treatment well ?Patient left: in chair;with call bell/phone within reach;with chair alarm set ?Nurse Communication: Mobility status ?PT Visit Diagnosis: Unsteadiness on feet (R26.81);Repeated falls (R29.6);Difficulty in walking, not elsewhere classified (R26.2) ?  ? ?Time: [phone] ?PT Time Calculation (min) (ACUTE ONLY): 38 min ? ? ?Charges:   PT Evaluation ?$PT Eval Moderate Complexity: 1 Mod ?PT Treatments ?$Gait Training: 8-22 mins ?$Therapeutic Activity: 8-22 mins ?  ?   ? ? ?Lewis ShockAshly Nikelle Malatesta, PT, DPT ?Acute Rehabilitation Services ?Pager #: [phone] ?Office #: [phone] ? ? ?Kemaya Dorner M Polo Mcmartin ?05/19/2021, 8:51 AM ? ?

## 2021-05-19 NOTE — Progress Notes (Signed)
?   ? ? ? ? ?[facility] ? ?Date of Admission:  05/16/2021    ? ?Abx: ?3/17-c vanc ?3/17-c ceftriaxone                                                      ?  ?  ?Assessment: ?61 yo male obese, htn, admitted 3/17 for acute 2 week worsening back pain found to have T8-9 mri imaging suggestion of osteomyelitis/discitis. He exhibit SIRs physiology/leukocytosis and was started on empiric abx ?  ?He is currently at [facility] awaiting IR biopsy of the affected area ?  ?Of note, ct renal protocol 10 days prior to this admission didn't reveal concerning t8-9 changes. And also he had concern for tooth pain/dental infection just prior to back pain onset that had resolved prior to this admission; no abx taken and no dental visit yet ?  ?3/20 assessment ?Blood culture were obtained prior to abx initiation nonesbl ecoli ?3/19 s/p aspirate of the t8-9 disc --> cx showing gnr and gram positiv coccobacilli (suspect artifact vs cross contaminant) ?  ?Plan: ?Switch ceftriaxone to cefazolin ?F/u final spinal tissue culture ?Once cx finalized will place opat for likely 6 weeks treatment ?Discussed with primary team  ?  ? ?Principal Problem: ?  Osteomyelitis (HCC) ?Active Problems: ?  Essential hypertension ?  Obesity, Class III, BMI 40-49.9 (morbid obesity) (HCC) ?  Gram-negative bacteremia ? ? ?No Known Allergies ? ?Scheduled Meds: ? enoxaparin (LOVENOX) injection  75 mg Subcutaneous Q24H  ? pantoprazole  40 mg Oral Daily  ? triamterene-hydrochlorothiazide  1 tablet Oral Daily  ? ?Continuous Infusions: ?  ceFAZolin (ANCEF) IV    ? ?PRN Meds:.acetaminophen **OR** acetaminophen, HYDROmorphone (DILAUDID) injection, ondansetron (ZOFRAN) IV, oxyCODONE ? ? ?SUBJECTIVE: ?Pain a little better in the back ?No n/v/diarrhea ?No fever/chill ? ?Wbc down ? ?Review of Systems: ?ROS ?All other ROS was negative, except mentioned above ? ? ? ? ?OBJECTIVE: ?Vitals:  ? 05/18/21 1934 05/18/21 2313 05/19/21 0410 05/19/21 0745   ?BP: (!) 124/91 129/86 127/78 (!) 134/92  ?Pulse: 99 97 89   ?Resp: 20 20 (!) 21 20  ?Temp: 97.8 ?F (36.6 ?C) 98.3 ?F (36.8 ?C) 98.4 ?F (36.9 ?C) 97.7 ?F (36.5 ?C)  ?TempSrc: Oral Oral Oral Oral  ?SpO2: 93% 94% 94%   ?Weight:      ?Height:      ? ?Body mass index is 44.62 kg/m?. ? ?Physical Exam ?General/constitutional: reticent; some verbal; sitting in chair; obese; no distress ?HEENT: Normocephalic, PER, Conj Clear; poor dentition ?Neck supple ?CV: rrr no mrg ?Lungs: normal respiratory effort ?Abd: Soft, Nontender ?Ext: no edema ?Skin: No Rash ?Neuro: nonfocal ? ? ? ? ?Lab Results ?Lab Results  ?Component Value Date  ? WBC 10.8 (H) 05/19/2021  ? HGB 15.2 05/19/2021  ? HCT 44.6 05/19/2021  ? MCV 87.8 05/19/2021  ? PLT 299 05/19/2021  ?  ?Lab Results  ?Component Value Date  ? CREATININE 1.20 05/18/2021  ? BUN 8 05/18/2021  ? NA 132 (L) 05/18/2021  ? K 3.9 05/18/2021  ? CL 98 05/18/2021  ? CO2 23 05/18/2021  ?  ?Lab Results  ?Component Value Date  ? ALT 43 05/16/2021  ? AST 26 05/16/2021  ? ALKPHOS 77 05/16/2021  ? BILITOT 1.0 05/16/2021  ?  ? ? ?  Microbiology: ?Recent Results (from the past 240 hour(s))  ?Blood culture (routine x 2)     Status: Abnormal  ? Collection Time: 05/16/21  6:00 PM  ? Specimen: Right Antecubital; Blood  ?Result Value Ref Range Status  ? Specimen Description   Final  ?  RIGHT ANTECUBITAL ?Performed at [facility], [address] [place], [place] [address] ?  ? Special Requests   Final  ?  BOTTLES DRAWN AEROBIC AND ANAEROBIC Blood Culture adequate volume ?Performed at [facility], [address] [place], [place] [address] ?  ? Culture  Setup Time   Final  ?  GRAM NEGATIVE RODS ?IN BOTH AEROBIC AND ANAEROBIC BOTTLES ?Gram Stain Report Called to,Read Back By and Verified With: KUFFOR @ 1220 ON [phone] BY HENDERSON L ?Organism ID to follow ?Performed at [facility], [address] [place], [place] [address] ?  ? Culture ESCHERICHIA COLI (A)  Final  ? Report Status 05/19/2021 FINAL   Final  ? Organism ID, Bacteria ESCHERICHIA COLI  Final  ?    Susceptibility  ? Escherichia coli - MIC*  ?  AMPICILLIN 4 SENSITIVE Sensitive   ?  CEFAZOLIN <=4 SENSITIVE Sensitive   ?  CEFEPIME <=0.12 SENSITIVE Sensitive   ?  CEFTAZIDIME <=1 SENSITIVE Sensitive   ?  CEFTRIAXONE <=0.25 SENSITIVE Sensitive   ?  CIPROFLOXACIN <=0.25 SENSITIVE Sensitive   ?  GENTAMICIN <=1 SENSITIVE Sensitive   ?  IMIPENEM <=0.25 SENSITIVE Sensitive   ?  TRIMETH/SULFA <=20 SENSITIVE Sensitive   ?  AMPICILLIN/SULBACTAM <=2 SENSITIVE Sensitive   ?  PIP/TAZO <=4 SENSITIVE Sensitive   ?  * ESCHERICHIA COLI  ?Blood culture (routine x 2)     Status: Abnormal  ? Collection Time: 05/16/21  6:00 PM  ? Specimen: BLOOD LEFT HAND  ?Result Value Ref Range Status  ? Specimen Description   Final  ?  BLOOD LEFT HAND ?Performed at [facility], [address] [place], [place] [address] ?  ? Special Requests   Final  ?  BOTTLES DRAWN AEROBIC AND ANAEROBIC Blood Culture adequate volume ?Performed at [facility], [address] [place], [place] [address] ?  ? Culture  Setup Time   Final  ?  AEROBIC BOTTLE ONLY GRAM NEGATIVE RODS ?GRAM STAIN PREVIOUSLY CALLED ?  ? Culture (A)  Final  ?  ESCHERICHIA COLI ?SUSCEPTIBILITIES PERFORMED ON PREVIOUS CULTURE WITHIN THE LAST 5 DAYS. ?Performed at [facility], [address] [place], [place] [address] ?  ? Report Status 05/19/2021 FINAL  Final  ?Blood Culture ID Panel (Reflexed)     Status: Abnormal  ? Collection Time: 05/16/21  6:00 PM  ?Result Value Ref Range Status  ? Enterococcus faecalis NOT DETECTED NOT DETECTED Final  ? Enterococcus Faecium NOT DETECTED NOT DETECTED Final  ? Listeria monocytogenes NOT DETECTED NOT DETECTED Final  ? Staphylococcus species NOT DETECTED NOT DETECTED Final  ? Staphylococcus aureus (BCID) NOT DETECTED NOT DETECTED Final  ? Staphylococcus epidermidis NOT DETECTED NOT DETECTED Final  ? Staphylococcus lugdunensis NOT DETECTED NOT DETECTED Final  ? Streptococcus species NOT  DETECTED NOT DETECTED Final  ? Streptococcus agalactiae NOT DETECTED NOT DETECTED Final  ? Streptococcus pneumoniae NOT DETECTED NOT DETECTED Final  ? Streptococcus pyogenes NOT DETECTED NOT DETECTED Final  ? A.calcoaceticus-baumannii NOT DETECTED NOT DETECTED Final  ? Bacteroides fragilis NOT DETECTED NOT DETECTED Final  ? Enterobacterales DETECTED (A) NOT DETECTED Final  ?  Comment: Enterobacterales represent a large order of  gram negative bacteria, not a single organism. ?CRITICAL RESULT CALLED TO, READ BACK BY AND VERIFIED WITH: ?Jone Baseman PHARMD, AT 1711 05/17/21 BY D. VANHOOK ?  ? Enterobacter cloacae complex NOT DETECTED NOT DETECTED Final  ? Escherichia coli DETECTED (A) NOT DETECTED Final  ?  Comment: CRITICAL RESULT CALLED TO, READ BACK BY AND VERIFIED WITH: ?Jone Baseman PHARMD, AT 1711 05/17/21 BY D. VANHOOK ?  ? Klebsiella aerogenes NOT DETECTED NOT DETECTED Final  ? Klebsiella oxytoca NOT DETECTED NOT DETECTED Final  ? Klebsiella pneumoniae NOT DETECTED NOT DETECTED Final  ? Proteus species NOT DETECTED NOT DETECTED Final  ? Salmonella species NOT DETECTED NOT DETECTED Final  ? Serratia marcescens NOT DETECTED NOT DETECTED Final  ? Haemophilus influenzae NOT DETECTED NOT DETECTED Final  ? Neisseria meningitidis NOT DETECTED NOT DETECTED Final  ? Pseudomonas aeruginosa NOT DETECTED NOT DETECTED Final  ? Stenotrophomonas maltophilia NOT DETECTED NOT DETECTED Final  ? Candida albicans NOT DETECTED NOT DETECTED Final  ? Candida auris NOT DETECTED NOT DETECTED Final  ? Candida glabrata NOT DETECTED NOT DETECTED Final  ? Candida krusei NOT DETECTED NOT DETECTED Final  ? Candida parapsilosis NOT DETECTED NOT DETECTED Final  ? Candida tropicalis NOT DETECTED NOT DETECTED Final  ? Cryptococcus neoformans/gattii NOT DETECTED NOT DETECTED Final  ? CTX-M ESBL NOT DETECTED NOT DETECTED Final  ? Carbapenem resistance IMP NOT DETECTED NOT DETECTED Final  ? Carbapenem resistance KPC NOT DETECTED NOT DETECTED Final  ?  Carbapenem resistance NDM NOT DETECTED NOT DETECTED Final  ? Carbapenem resist OXA 48 LIKE NOT DETECTED NOT DETECTED Final  ? Carbapenem resistance VIM NOT DETECTED NOT DETECTED Final  ?  Comment: Performed at [facility]

## 2021-05-20 ENCOUNTER — Inpatient Hospital Stay (HOSPITAL_COMMUNITY): Payer: BC Managed Care – PPO

## 2021-05-20 DIAGNOSIS — M8618 Other acute osteomyelitis, other site: Secondary | ICD-10-CM | POA: Diagnosis not present

## 2021-05-20 LAB — COMPREHENSIVE METABOLIC PANEL
ALT: 33 U/L (ref 0–44)
AST: 27 U/L (ref 15–41)
Albumin: 2.7 g/dL — ABNORMAL LOW (ref 3.5–5.0)
Alkaline Phosphatase: 75 U/L (ref 38–126)
Anion gap: 13 (ref 5–15)
BUN: 15 mg/dL (ref 6–20)
CO2: 23 mmol/L (ref 22–32)
Calcium: 9.4 mg/dL (ref 8.9–10.3)
Chloride: 98 mmol/L (ref 98–111)
Creatinine, Ser: 1.24 mg/dL (ref 0.61–1.24)
GFR, Estimated: >60 mL/min (ref >60)
Glucose, Bld: 118 mg/dL — ABNORMAL HIGH (ref 70–99)
Potassium: 3.6 mmol/L (ref 3.5–5.1)
Sodium: 134 mmol/L — ABNORMAL LOW (ref 135–145)
Total Bilirubin: 0.9 mg/dL (ref 0.3–1.2)
Total Protein: 7.7 g/dL (ref 6.5–8.1)

## 2021-05-20 LAB — MAGNESIUM: Magnesium: 2.2 mg/dL (ref 1.7–2.4)

## 2021-05-20 LAB — CBC
HCT: 46 % (ref 39.0–52.0)
Hemoglobin: 15.7 g/dL (ref 13.0–17.0)
MCH: 29.9 pg (ref 26.0–34.0)
MCHC: 34.1 g/dL (ref 30.0–36.0)
MCV: 87.6 fL (ref 80.0–100.0)
Platelets: 321 10*3/uL (ref 150–400)
RBC: 5.25 MIL/uL (ref 4.22–5.81)
RDW: 13.6 % (ref 11.5–15.5)
WBC: 12.2 10*3/uL — ABNORMAL HIGH (ref 4.0–10.5)
nRBC: 0 % (ref 0.0–0.2)

## 2021-05-20 IMAGING — DX DG ABDOMEN 1V
3 series · 3 of 3 positions shown · non-contrast
Comparison: CT abdomen pelvis dated [DATE].

CLINICAL DATA: No bowel movement in 2 weeks.

EXAM:
ABDOMEN - 1 VIEW

[abdomen kub (1 of 3)]
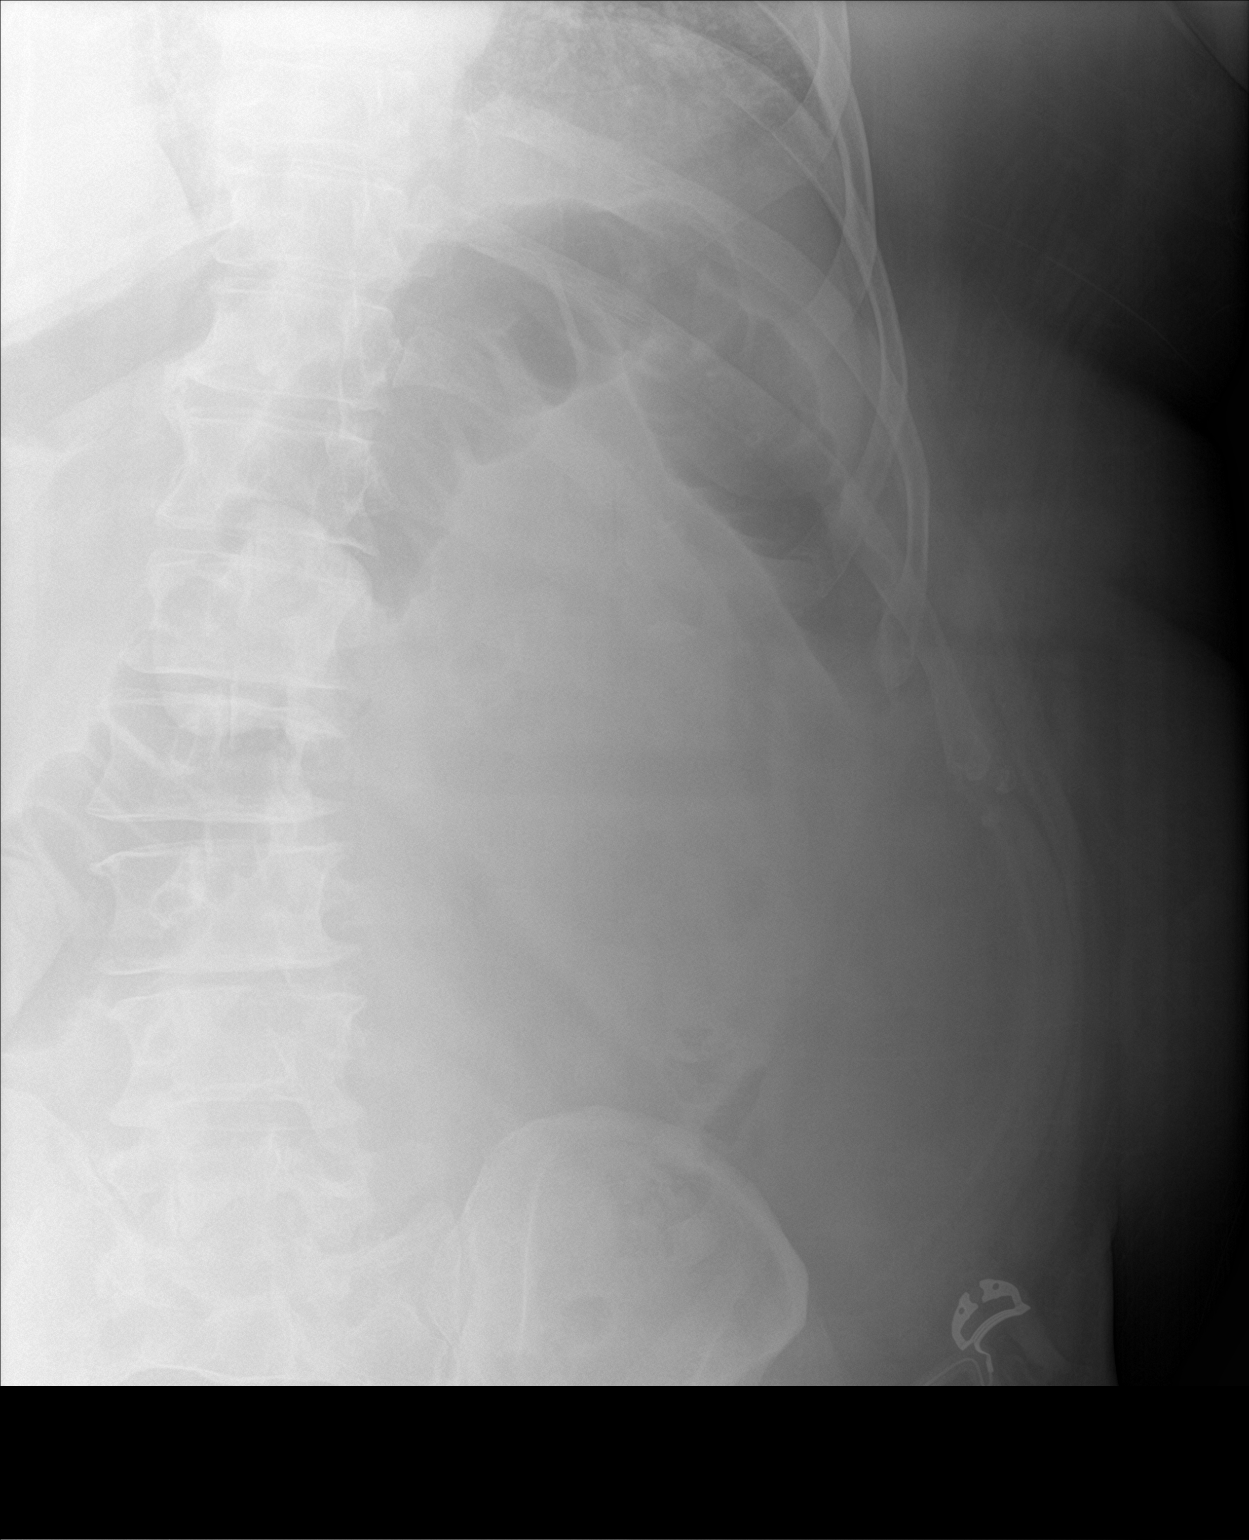

[abdomen kub (2 of 3)]
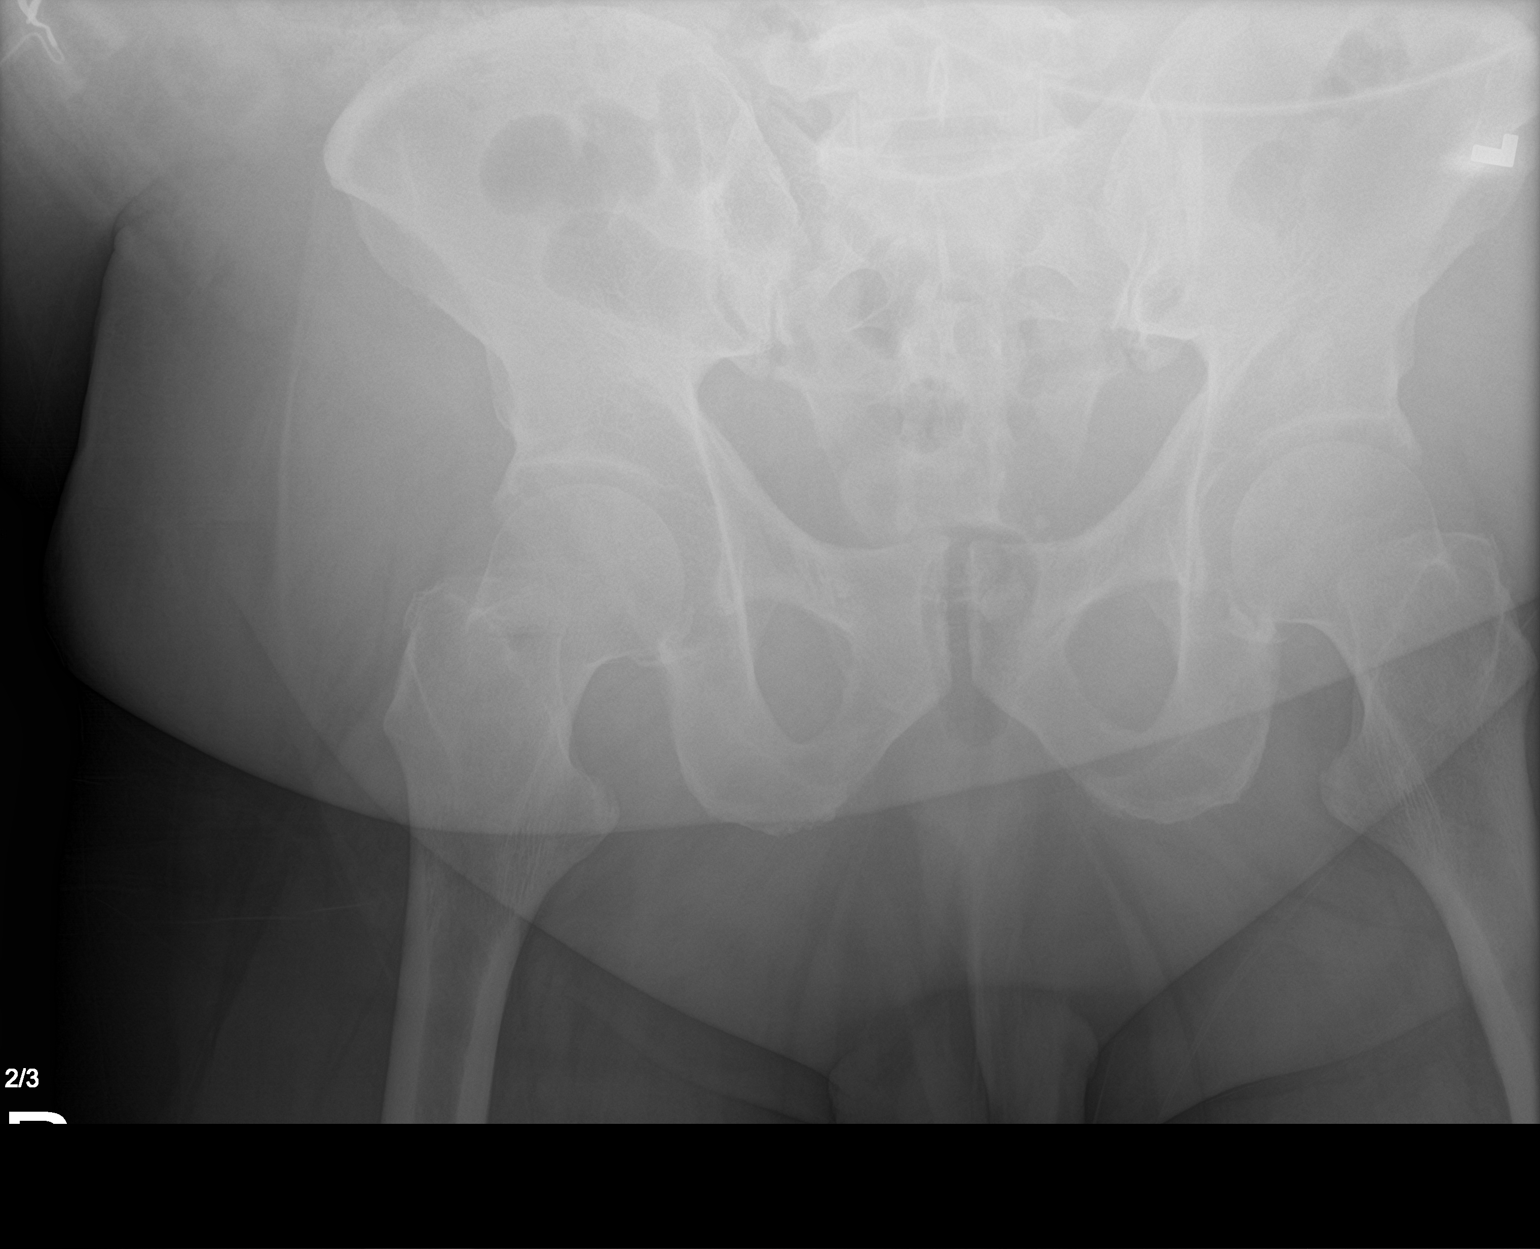

[abdomen kub (3 of 3)]
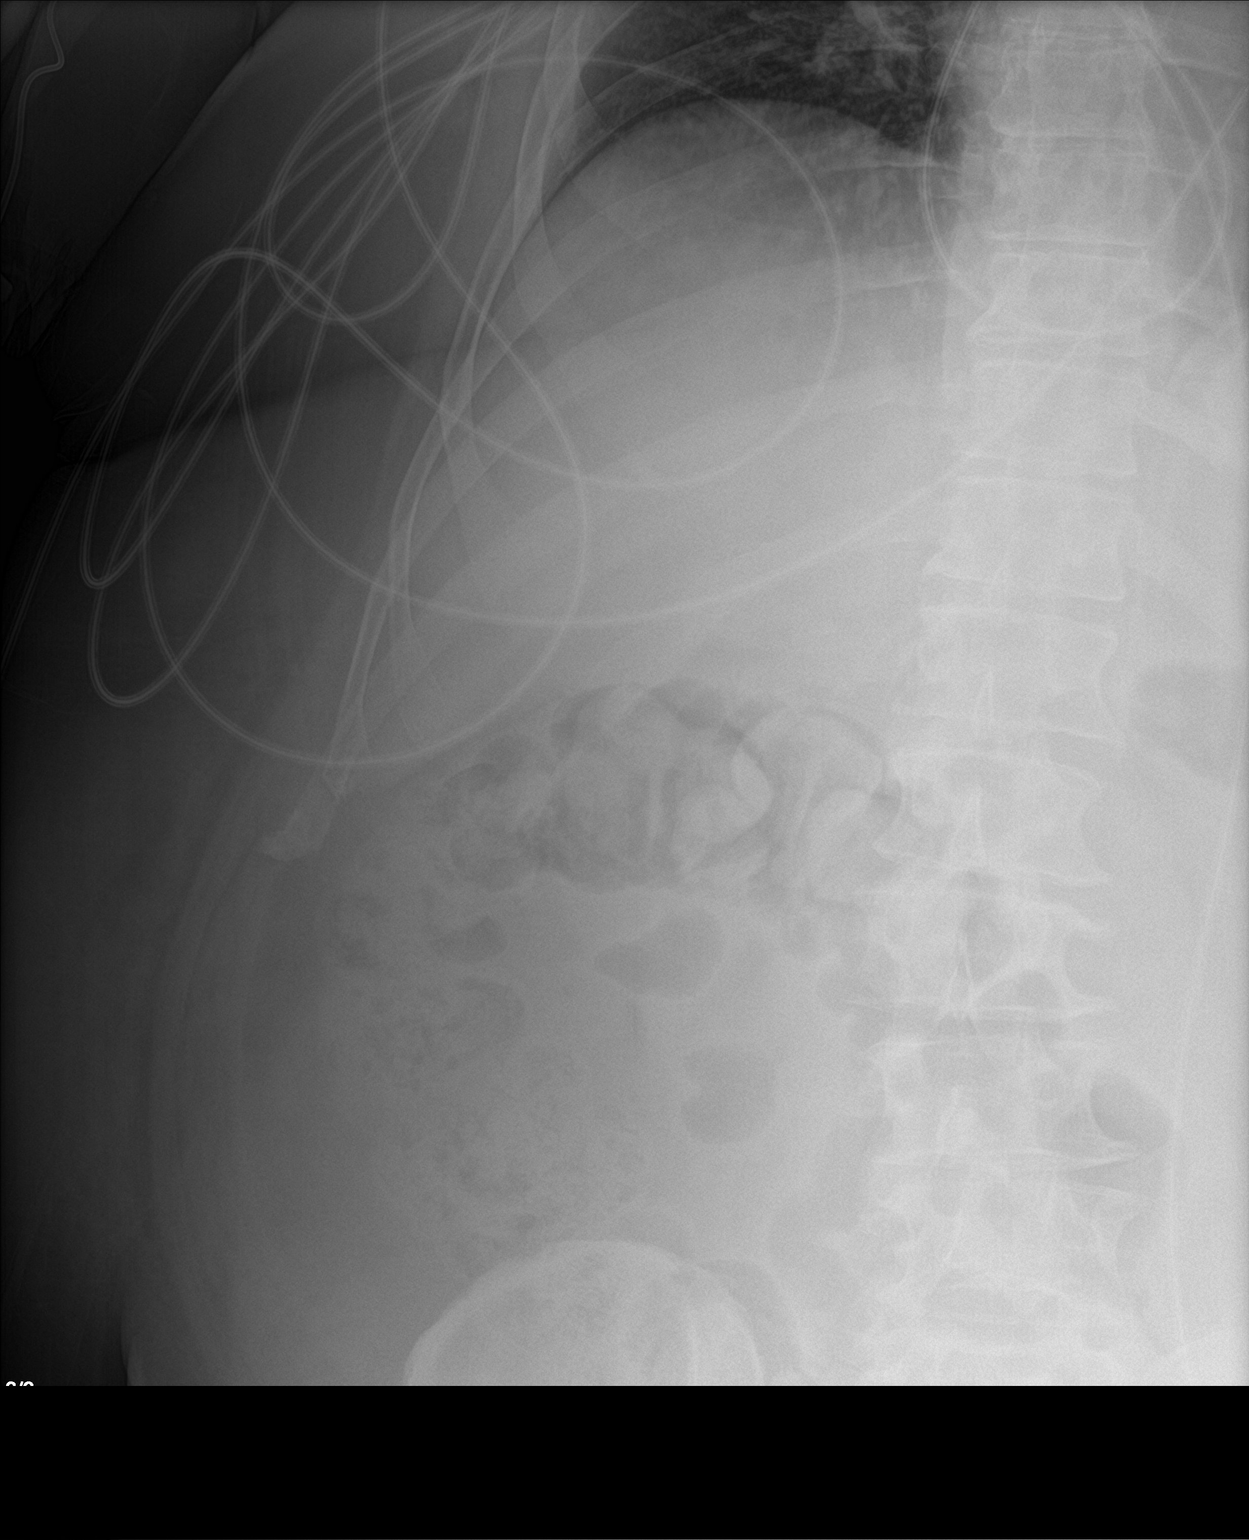

[3 of 3 positions shown; findings below may reference images not displayed]

FINDINGS: The bowel gas pattern is normal. Overall normal colonic stool burden
with mild amount of stool in the right colon. No radio-opaque
calculi or other significant radiographic abnormality are seen.
IMPRESSION: 1. Negative.

## 2021-05-20 MED ORDER — PROCHLORPERAZINE EDISYLATE 10 MG/2ML IJ SOLN
10.0000 mg | Freq: Four times a day (QID) | INTRAMUSCULAR | Status: DC | PRN
  Administered 2021-05-20: 10 mg via INTRAVENOUS
  Filled 2021-05-20: qty 2

## 2021-05-20 MED ORDER — CHLORHEXIDINE GLUCONATE CLOTH 2 % EX PADS
6.0000 | MEDICATED_PAD | Freq: Every day | CUTANEOUS | Status: DC
  Administered 2021-05-20: 6 via TOPICAL

## 2021-05-20 MED ORDER — SODIUM CHLORIDE 0.9% FLUSH
10.0000 mL | Freq: Two times a day (BID) | INTRAVENOUS | Status: DC
  Administered 2021-05-20 – 2021-05-21 (×3): 10 mL

## 2021-05-20 MED ORDER — SENNOSIDES-DOCUSATE SODIUM 8.6-50 MG PO TABS
2.0000 | ORAL_TABLET | Freq: Once | ORAL | Status: AC
  Administered 2021-05-20: 2 via ORAL
  Filled 2021-05-20: qty 2

## 2021-05-20 MED ORDER — SODIUM CHLORIDE 0.9% FLUSH: 10.0000 mL | INTRAVENOUS | Status: DC | PRN

## 2021-05-20 MED ORDER — POLYETHYLENE GLYCOL 3350 17 G PO PACK
17.0000 g | PACK | Freq: Every day | ORAL | Status: DC
Start: 2021-05-20 — End: 2021-05-21
  Administered 2021-05-20 – 2021-05-21 (×2): 17 g via ORAL
  Filled 2021-05-20 (×2): qty 1

## 2021-05-20 NOTE — Progress Notes (Addendum)
?PROGRESS NOTE ? ?Michael ZACCONE HAL:[id number] DOB: 1960/06/13 DOA: 05/16/2021 ?PCP: [name], PA ? ? LOS: 4 days  ? ?Brief Narrative / Interim history: ?Michael [name] is a 61 y.o. male with a history of hypertension and obesity. Patient presented secondary to back pain and was found to have evidence of vertebral osteomyelitis/discitis. Empiric Vancomycin and Ceftriaxone started. ID consulted. Blood cultures and spinal aspirate growing E coli.  ? ?Subjective / 24h Interval events: ?Complains of intractable nausea, all night as well as this morning. Has been unable to eat. ? ?Assesement and Plan: ?Principal Problem: ?  Osteomyelitis (HCC) ?Active Problems: ?  Gram-negative bacteremia ?  Essential hypertension ?  Obesity, Class III, BMI 40-49.9 (morbid obesity) (HCC) ? ?Assessment and Plan: ?Principal problem ?Osteomyelitis (HCC) -Thoracic location; T8-T9. Noted on MRI. Recent dental infection.  He was initially started on broad-spectrum antibiotics with ceftriaxone.  ID consulted.  IR consulted as well and underwent disc aspiration 3/19.  Blood cultures with E coli, pansensitive. Spinal aspirate cultures with E coli, but also gram positive coccobacillus, final report still pending but likely a contaminant.  Placing PICC line per ID as he will need 6 weeks of antibiotics, final OPAT orders to be placed by ID once cultures finalize. Tentative end date for antibiotics 4/28 ? ?Active problems ?E. coli bacteremia-treatment per ID ? ?Constipation, nausea - nausea worse today, unable to eat much. Asking for compazine as zofran is not working. Has not had a BM in ~7 days. Start bowel regimen ? ?Obesity, Class III, BMI 40-49.9 (morbid obesity) (HCC) -Body mass index is 44.62 kg/m?. ? ?Essential hypertension-Continue home triamterene and hydrochlorothiazide ? ?Scheduled Meds: ? Chlorhexidine Gluconate Cloth  6 each Topical Daily  ? enoxaparin (LOVENOX) injection  75 mg Subcutaneous Q24H  ? pantoprazole  40 mg Oral  Daily  ? sodium chloride flush  10-40 mL Intracatheter Q12H  ? triamterene-hydrochlorothiazide  1 tablet Oral Daily  ? ?Continuous Infusions: ?  ceFAZolin (ANCEF) IV 2 g (05/20/21 0510)  ? ?PRN Meds:.acetaminophen **OR** acetaminophen, HYDROmorphone (DILAUDID) injection, ondansetron (ZOFRAN) IV, oxyCODONE, prochlorperazine, sodium chloride flush ? ?Diet Orders (From admission, onward)  ? ?  Start     Ordered  ? 05/18/21 1347  Diet Heart Room service appropriate? Yes; Fluid consistency: Thin  Diet effective now       ?Question Answer Comment  ?Room service appropriate? Yes   ?Fluid consistency: Thin   ?  ? 05/18/21 1347  ? ?  ?  ? ?  ? ? ?DVT prophylaxis: SCDs Start: 05/16/21 2125 ? ? ?Lab Results  ?Component Value Date  ? PLT 321 05/20/2021  ? ? ?  Code Status: Full Code ? ?Family Communication: wife and mother at bedside ? ?Status is: Inpatient ? ?Remains inpatient appropriate because: culture to finalize ? ?Level of care: Med-Surg ? ?Consultants:  ?IR ?ID ? ? ?Objective: ?Vitals:  ? 05/19/21 1933 05/19/21 2310 05/20/21 0305 05/20/21 0757  ?BP: 130/78 133/84 119/81 113/79  ?Pulse: (!) 102 (!) 103 96   ?Resp: 20 20 20 20   ?Temp: 98.4 ?F (36.9 ?C) 97.9 ?F (36.6 ?C) 98.4 ?F (36.9 ?C) 97.9 ?F (36.6 ?C)  ?TempSrc: Oral Oral Oral Oral  ?SpO2: 93% 95% 93%   ?Weight:      ?Height:      ? ? ?Intake/Output Summary (Last 24 hours) at 05/20/2021 1042 ?Last data filed at 05/20/2021 1011 ?Gross per 24 hour  ?Intake 1380 ml  ?Output 1300 ml  ?Net 80 ml  ? ? ?  Wt Readings from Last 3 Encounters:  ?05/16/21 (!) 149.2 kg  ?05/08/21 (!) 149.7 kg  ?04/26/14 128.4 kg  ? ? ?Examination: ?Constitutional: NAD ?Eyes: lids and conjunctivae normal, no scleral icterus ?ENMT: mmm ?Neck: normal, supple ?Respiratory: clear to auscultation bilaterally, no wheezing, no crackles. Normal respiratory effort.  ?Cardiovascular: Regular rate and rhythm, no murmurs / rubs / gallops. No LE edema. ?Abdomen: soft, no distention, no tenderness. Bowel sounds  positive.  ?Skin: no rashes ?Neurologic: no focal deficits, equal strength ? ?Data Reviewed: I have independently reviewed following labs and imaging studies ? ?CBC ?Recent Labs  ?Lab 05/16/21 ?1255 05/17/21 ?[id number] 05/18/21 ?0430 05/19/21 ?[id number] 05/20/21 ?[id number]  ?WBC 19.5* 17.1* 13.2* 10.8* 12.2*  ?HGB 15.0 14.0 14.0 15.2 15.7  ?HCT 46.6 41.8 42.7 44.6 46.0  ?PLT 319 273 281 299 321  ?MCV 92.1 90.7 89.3 87.8 87.6  ?MCH 29.6 30.4 29.3 29.9 29.9  ?MCHC 32.2 33.5 32.8 34.1 34.1  ?RDW 14.0 13.9 13.5 13.4 13.6  ?LYMPHSABS 1.2  --   --   --   --   ?MONOABS 1.8*  --   --   --   --   ?EOSABS 0.1  --   --   --   --   ?BASOSABS 0.1  --   --   --   --   ? ? ? ?Recent Labs  ?Lab 05/16/21 ?1255 05/16/21 ?1800 05/16/21 ?2020 05/17/21 ?[id number] 05/18/21 ?0430 05/20/21 ?[id number]  ?NA 135  --   --  136 132* 134*  ?K 4.3  --   --  4.2 3.9 3.6  ?CL 100  --   --  103 98 98  ?CO2 25  --   --  24 23 23   ?GLUCOSE 114*  --   --  111* 108* 118*  ?BUN 12  --   --  8 8 15   ?CREATININE 1.18  --   --  1.25* 1.20 1.24  ?CALCIUM 9.1  --   --  8.8* 8.8* 9.4  ?AST 26  --   --   --   --  27  ?ALT 43  --   --   --   --  33  ?ALKPHOS 77  --   --   --   --  75  ?BILITOT 1.0  --   --   --   --  0.9  ?ALBUMIN 3.4*  --   --   --   --  2.7*  ?MG  --   --   --   --   --  2.2  ?CRP  --  13.7*  --   --   --   --   ?LATICACIDVEN  --   --  1.0  --   --   --   ? ? ? ?------------------------------------------------------------------------------------------------------------------ ?No results for input(s): CHOL, HDL, LDLCALC, TRIG, CHOLHDL, LDLDIRECT in the last 72 hours. ? ?No results found for: HGBA1C ?------------------------------------------------------------------------------------------------------------------ ?No results for input(s): TSH, T4TOTAL, T3FREE, THYROIDAB in the last 72 hours. ? ?Invalid input(s): FREET3 ? ?Cardiac Enzymes ?No results for input(s): CKMB, TROPONINI, MYOGLOBIN in the last 168 hours. ? ?Invalid input(s):  CK ?------------------------------------------------------------------------------------------------------------------ ?No results found for: BNP ? ?CBG: ?No results for input(s): GLUCAP in the last 168 hours. ? ?Recent Results (from the past 240 hour(s))  ?Blood culture (routine x 2)     Status: Abnormal  ? Collection Time: 05/16/21  6:00 PM  ? Specimen: Right Antecubital; Blood  ?Result Value Ref  Range Status  ? Specimen Description   Final  ?  RIGHT ANTECUBITAL ?Performed at [facility], [address], [place], [place] [address] ?  ? Special Requests   Final  ?  BOTTLES DRAWN AEROBIC AND ANAEROBIC Blood Culture adequate volume ?Performed at [facility], [address], [place], [place] [address] ?  ? Culture  Setup Time   Final  ?  GRAM NEGATIVE RODS ?IN BOTH AEROBIC AND ANAEROBIC BOTTLES ?Gram Stain Report Called to,Read Back By and Verified With: KUFFOR @ 1220 ON [id number] BY HENDERSON L ?Organism ID to follow ?Performed at [facility], [address], [place], [place] [address] ?  ? Culture ESCHERICHIA COLI (A)  Final  ? Report Status 05/19/2021 FINAL  Final  ? Organism ID, Bacteria ESCHERICHIA COLI  Final  ?    Susceptibility  ? Escherichia coli - MIC*  ?  AMPICILLIN 4 SENSITIVE Sensitive   ?  CEFAZOLIN <=4 SENSITIVE Sensitive   ?  CEFEPIME <=0.12 SENSITIVE Sensitive   ?  CEFTAZIDIME <=1 SENSITIVE Sensitive   ?  CEFTRIAXONE <=0.25 SENSITIVE Sensitive   ?  CIPROFLOXACIN <=0.25 SENSITIVE Sensitive   ?  GENTAMICIN <=1 SENSITIVE Sensitive   ?  IMIPENEM <=0.25 SENSITIVE Sensitive   ?  TRIMETH/SULFA <=20 SENSITIVE Sensitive   ?  AMPICILLIN/SULBACTAM <=2 SENSITIVE Sensitive   ?  PIP/TAZO <=4 SENSITIVE Sensitive   ?  * ESCHERICHIA COLI  ?Blood culture (routine x 2)     Status: Abnormal  ? Collection Time: 05/16/21  6:00 PM  ? Specimen: BLOOD LEFT HAND  ?Result Value Ref Range Status  ? Specimen Description   Final  ?  BLOOD LEFT HAND ?Performed at [facility], [address] [place], [place] [address] ?  ?  Special Requests   Final  ?  BOTTLES DRAWN AEROBIC AND ANAEROBIC Blood Culture adequate volume ?Performed at [facility], [address], [place], [place] [address] ?  ? Culture  Setup Time   Final  ?  AEROBIC BOTTLE ONLY GRAM NEGATIVE RODS ?Romie Minus

## 2021-05-20 NOTE — Progress Notes (Signed)
PHARMACY CONSULT NOTE FOR: ? ?OUTPATIENT  PARENTERAL ANTIBIOTIC THERAPY (OPAT) ? ?Indication: E Coli Bacteremia and Discitis  ?Regimen: Cefazolin 2 gm IV Q 8 hours ?End date: 06/27/21 ? ?IV antibiotic discharge orders are pended. ?To discharging provider:  please sign these orders via discharge navigator,  ?Select New Orders & click on the button choice - Manage This Unsigned Work.  ?  ? ?Thank you for allowing pharmacy to be a part of this patient's care. ? ?Sharin Mons, PharmD, BCPS, BCIDP ?Infectious Diseases Clinical Pharmacist ?Phone: [phone] ?05/20/2021, 11:01 AM ? ?

## 2021-05-20 NOTE — Progress Notes (Addendum)
? ? ?  Abx: ?cefazolin ? ?Diagnosis: ?E coli bacteremia and t8-9 osteomyelitis ? ?Culture Result: ecoli in t8-9 biopsy and blood ? ?Reviewed with labs today -- the coccobacillus was misread; only ecoli was seen; anaerobic cx no growth ? ?No Known Allergies ? ?OPAT Orders ?Discharge antibiotics to be given via PICC line ?Discharge antibiotics: cefazolin ?Duration: ?6 wks from 3/17 ?End Date: ?4/28 ? ?[facility] Care Per Protocol: ? ?Home health RN for IV administration and teaching; PICC line care and labs.   ? ?Labs weekly while on IV antibiotics: ?_x_ CBC with differential ?__ BMP ?_x_ CMP ?_x_ CRP ?__ ESR ?__ Vancomycin trough ?__ CK ? ?_x_ Please pull PIC at completion of IV antibiotics ?__ Please leave PIC in place until doctor has seen patient or been notified ? ?Fax weekly labs to [phone] ? ?Clinic Follow Up Appt: ?4/18 @ 945 with dr Gale Journey ? ?@ ? ?[facility] ?[place], [place], [place] [address] ?Phone: [phone] ? ?

## 2021-05-20 NOTE — Progress Notes (Signed)
Mobility Specialist: Progress Note ? ? 05/20/21 1756  ?Mobility  ?Activity Ambulated with assistance in hallway  ?Level of Assistance Minimal assist, patient does 75% or more  ?Assistive Device Front wheel walker  ?Distance Ambulated (ft) 280 ft  ?Activity Response Tolerated well  ?$Mobility charge 1 Mobility  ? ?Received pt in bed having no complaints and agreeable to mobility. MinA to sit EOB and Mod I to stand. Asymptomatic throughout ambulation, returned back to bed w/ call bell in reach and all needs met. ? ?Michael [name] ?Mobility Specialist ?Mobility Specialist [place]: [phone] ?Mobility Specialist [place]: [phone] ? ?

## 2021-05-20 NOTE — Progress Notes (Signed)
Peripherally Inserted Central Catheter Placement ? ?The IV Nurse has discussed with the patient and/or persons authorized to consent for the patient, the purpose of this procedure and the potential benefits and risks involved with this procedure.  The benefits include less needle sticks, lab draws from the catheter, and the patient may be discharged home with the catheter. Risks include, but not limited to, infection, bleeding, blood clot (thrombus formation), and puncture of an artery; nerve damage and irregular heartbeat and possibility to perform a PICC exchange if needed/ordered by physician.  Alternatives to this procedure were also discussed.  Bard Power PICC patient education guide, fact sheet on infection prevention and patient information card has been provided to patient /or left at bedside.   ? ?PICC Placement Documentation  ?PICC Single Lumen 05/20/21 Right Brachial 43 cm 0 cm (Active)  ?Indication for Insertion or Continuance of Line Home intravenous therapies (PICC only) 05/20/21 0903  ?Exposed Catheter (cm) 0 cm 05/20/21 0903  ?Site Assessment Clean, Dry, Intact 05/20/21 0903  ?Line Status Flushed;Saline locked;Blood return noted 05/20/21 0903  ?Dressing Type Securing device;Transparent 05/20/21 0903  ?Dressing Status Antimicrobial disc in place 05/20/21 0903  ?Dressing Intervention New dressing;Other (Comment) 05/20/21 0998  ?Dressing Change Due 05/27/21 05/20/21 3382  ? ? ? ? ? ?Reginia Forts Albarece ?05/20/2021, 9:05 AM ? ?

## 2021-05-20 NOTE — TOC Progression Note (Signed)
Transition of Care (TOC) - Progression Note  ? ? ?Patient Details  ?Name: Michael [name] ?MRN: [id number] ?Date of Birth: 1960/12/30 ? ?Transition of Care (TOC) CM/SW Contact  ?Beckie Busing, RN ?Phone Number:[phone] ? ?05/20/2021, 2:52 PM ? ?Clinical Narrative:    ?TOC consulted for patient discharging home with IV antibiotics. Home IV antibiotic infusion referral has been called to Jeri Modena with [facility]. Home infusion RN through [facility]. Pam with [facility] to come to bedside for teaching in am 3/22. MD will need to sign OPAT orders.  ? ? ?  ?  ? ?Expected Discharge Plan and Services ?  ?  ?  ?  ?  ?                ?  ?  ?  ?  ?  ?  ?  ?  ?  ?  ? ? ?Social Determinants of Health (SDOH) Interventions ?  ? ?Readmission Risk Interventions ?No flowsheet data found. ? ?

## 2021-05-20 NOTE — Progress Notes (Signed)
Physical Therapy Treatment ?Patient Details ?Name: Michael [name] ?MRN: [id number] ?DOB: 1960-12-31 ?Today's Date: 05/20/2021 ? ? ?History of Present Illness FRED ADJEI is a 61 y.o. male who presented to ED c/o back pain x 2 weeks. Pt was admitted for T8-T9 osteomyelitis. PMH: HTN, obesity, GERD ? ?  ?PT Comments  ? ? Pt needed encouragement to get up to mobilize but once up ambulated 240' with RW and one standing rest break. HR up to 152 with ambulation. Returned to low 130's after 3 mins seated rest. Pt ended in chair. Encouraged in frequent mobility. Wife and mother present. PT will continue to follow. ?   ?Recommendations for follow up therapy are one component of a multi-disciplinary discharge planning process, led by the attending physician.  Recommendations may be updated based on patient status, additional functional criteria and insurance authorization. ? ?Follow Up Recommendations ? Home health PT ?  ?  ?Assistance Recommended at Discharge Frequent or constant Supervision/Assistance  ?Patient can return home with the following A little help with walking and/or transfers;A little help with bathing/dressing/bathroom;Assistance with cooking/housework;Assist for transportation;Help with stairs or ramp for entrance ?  ?Equipment Recommendations ? Rolling walker (2 wheels) (bariatric)  ?  ?Recommendations for Other Services   ? ? ?  ?Precautions / Restrictions Precautions ?Precautions: Fall ?Precaution Comments: morbid obesity ?Restrictions ?Weight Bearing Restrictions: No  ?  ? ?Mobility ? Bed Mobility ?Overal bed mobility: Needs Assistance ?Bed Mobility: Supine to Sit ?Rolling:  (max verbal cues) ?  ?Supine to sit: Min assist ?  ?  ?General bed mobility comments: pt managed lower body independently, min HHA for pt to pull trunk to upright and hips fwd to EOB ?  ? ?Transfers ?Overall transfer level: Needs assistance ?Equipment used: Rolling walker (2 wheels) ?Transfers: Sit to/from Stand ?Sit to Stand: Min  guard ?  ?  ?  ?  ?  ?General transfer comment: pt requested wife come hold his hand "to anchor him" during sit>stand, min guard for safety ?  ? ?Ambulation/Gait ?Ambulation/Gait assistance: Min guard ?Gait Distance (Feet): 240 Feet ?Assistive device: Rolling walker (2 wheels) ?Gait Pattern/deviations: Step-through pattern, Decreased stride length, Decreased weight shift to right, Trunk flexed, Wide base of support ?Gait velocity: dec ?Gait velocity interpretation: 1.31 - 2.62 ft/sec, indicative of limited community ambulator ?  ?General Gait Details: HR up to 152 bpm with ambulation. Pt motivated to increase distance. SPO2 in 90's on RA. Took one standing rest break with cues for upright posture ? ? ?Stairs ?  ?  ?  ?  ?  ? ? ?Wheelchair Mobility ?  ? ?Modified Rankin (Stroke Patients Only) ?  ? ? ?  ?Balance Overall balance assessment: Mild deficits observed, not formally tested ?  ?  ?  ?  ?  ?  ?  ?  ?  ?  ?  ?  ?  ?  ?  ?  ?  ?  ?  ? ?  ?Cognition Arousal/Alertness: Awake/alert ?Behavior During Therapy: Flat affect ?Overall Cognitive Status: Within Functional Limits for tasks assessed ?  ?  ?  ?  ?  ?  ?  ?  ?  ?  ?  ?  ?  ?  ?  ?  ?General Comments: seems very depressed of mood, snappy with family members and over all frustration noted ?  ?  ? ?  ?Exercises   ? ?  ?General Comments General comments (skin integrity, edema, etc.): pt very hot  after ambulation, brought him a fan. HR back into 130's after 3 mins seated rest ?  ?  ? ?Pertinent Vitals/Pain Pain Assessment ?Pain Assessment: Faces ?Faces Pain Scale: Hurts little more ?Pain Location: mid back ?Pain Descriptors / Indicators: Discomfort ?Pain Intervention(s): Limited activity within patient's tolerance, Monitored during session  ? ? ?Home Living   ?  ?  ?  ?  ?  ?  ?  ?  ?  ?   ?  ?Prior Function    ?  ?  ?   ? ?PT Goals (current goals can now be found in the care plan section) Acute Rehab PT Goals ?Patient Stated Goal: none stated today ?PT Goal  Formulation: With patient ?Time For Goal Achievement: 06/02/21 ?Potential to Achieve Goals: Good ?Progress towards PT goals: Progressing toward goals ? ?  ?Frequency ? ? ? Min 3X/week ? ? ? ?  ?PT Plan Current plan remains appropriate  ? ? ?Co-evaluation   ?  ?  ?  ?  ? ?  ?AM-PAC PT "6 Clicks" Mobility   ?Outcome Measure ? Help needed turning from your back to your side while in a flat bed without using bedrails?: A Little ?Help needed moving from lying on your back to sitting on the side of a flat bed without using bedrails?: A Little ?Help needed moving to and from a bed to a chair (including a wheelchair)?: A Little ?Help needed standing up from a chair using your arms (e.g., wheelchair or bedside chair)?: A Little ?Help needed to walk in hospital room?: A Little ?Help needed climbing 3-5 steps with a railing? : A Little ?6 Click Score: 18 ? ?  ?End of Session Equipment Utilized During Treatment: Gait belt ?Activity Tolerance: Patient tolerated treatment well ?Patient left: in chair;with call bell/phone within reach;with family/visitor present ?Nurse Communication: Mobility status ?PT Visit Diagnosis: Unsteadiness on feet (R26.81);Repeated falls (R29.6);Difficulty in walking, not elsewhere classified (R26.2) ?  ? ? ?Time: [id number] ?PT Time Calculation (min) (ACUTE ONLY): 23 min ? ?Charges:  $Gait Training: 23-37 mins          ?          ? ?Leighton Roach, PT  ?Acute Rehab Services ? Pager [phone] ?Office [phone] ? ? ? ?[place] ?05/20/2021, 1:24 PM ? ?

## 2021-05-21 MED ORDER — HYDROCODONE-ACETAMINOPHEN 5-325 MG PO TABS: 2.0000 | ORAL_TABLET | Freq: Four times a day (QID) | ORAL | 0 refills | Status: AC | PRN

## 2021-05-21 MED ORDER — CEFAZOLIN IV (FOR PTA / DISCHARGE USE ONLY): 2.0000 g | Freq: Three times a day (TID) | INTRAVENOUS | 0 refills | Status: DC

## 2021-05-21 NOTE — Progress Notes (Signed)
Pt with discharge orders, discharge paperwork reviewed with patient and all questions answered. IV's removed. Pt given all equipment and taken down via wheelchair with all belongings.   

## 2021-05-21 NOTE — Progress Notes (Signed)
Mobility Specialist: Progress Note ? ? 05/21/21 1206  ?Mobility  ?Activity Refused mobility  ? ?Pt refused mobility stating he has been up to the BR several times today. Pt states he is hopeful for discharge later today.  ? ?Cristal Deer Requan Hardge ?Mobility Specialist ?Mobility Specialist 5 [address]: [phone] ?Mobility Specialist 6 [address]: [phone] ? ?

## 2021-05-21 NOTE — Discharge Summary (Signed)
? ?Physician Discharge Summary  ?Michael [name] IWP:[id number] DOB: 1960-05-20 DOA: 05/16/2021 ? ?PCP: Michael Lemons, PA ? ?Admit date: 05/16/2021 ?Discharge date: 05/21/2021 ? ?Admitted From: Home ?Discharge disposition: Home with home health ? ?History of Present Illness / Brief narrative:  ?Michael [name] is a 61 y.o. male with a history of hypertension and obesity.  ?Patient presented to the ED on 05/16/2021 with complaint of persistent back pain ongoing for 2 to 3 weeks. ?In the ED, patient was tachycardic, tachypneic, WBC count elevated to 19,000. ?CT abdomen pelvis showed T8-T9 discitis which was confirmed by thoracic MRI. ?Admitted to hospitalist service. ?Blood was sent on admission grew E. coli ?See below for details ? ?Subjective:  ?Seen and examined this morning.  Middle-aged Caucasian male.  Lying down in bed.  Not in distress.  Pain controlled.  Wife and mother at bedside. ? ?Hospital Course:  ?T8-T9 osteomyelitis ?E. coli bacteremia ?-Imaging as above.  Thoracic location; T8-T9. Noted on MRI. Recent dental infection.   ?-He was initially started on broad-spectrum antibiotics with ceftriaxone.  ID consulted.  IR consulted as well and underwent disc aspiration 3/19.  Blood cultures with E coli, pansensitive.  ?-Spinal aspirate cultures with E coli, but also gram positive coccobacillus, final report still pending but likely a contaminant.   ?-PICC line placed on 3/21.   ?-Per ID recommendation, patient will be discharged on IV Ancef for total of 6 weeks from 3/17.  EOT 06/27/2021.   ?-To follow-up with ID as an outpatient ? ?Essential hypertension ?-Continue HCTZ/triamterene. ? ?Morbid obesity  ?-Body mass index is 44.62 kg/m?Marland Kitchen Patient has been advised to make an attempt to improve diet and exercise patterns to aid in weight loss. ?  ?Constipation ?-Relieved with bowel regimen.  Continue the same at home. ? ?Impaired mobility ?-PT eval obtained.  Home with PT recommended ? ?Goals of care ?- ?-  Code  Status: Full Code  ? ?Nutritional status:  ?Body mass index is 44.62 kg/m?.  ?  ?  ?Diet:  ?Diet Order   ? ?       ?  Diet general       ?  ?  Diet Heart Room service appropriate? Yes; Fluid consistency: Thin  Diet effective now       ?  ? ?  ?  ? ?  ? ? ? ?Wounds:  ?- ?Incision (Closed) 05/18/21 Vertebral column Mid (Active)  ?Date First Assessed/Time First Assessed: 05/18/21 1230   Location: Vertebral column  Location Orientation: Mid  Present on Admission: No  ?  ?Assessments 05/18/2021 12:30 PM 05/21/2021  7:30 AM  ?Dressing Type Gauze (Comment) Gauze (Comment)  ?Dressing Clean, Dry, Intact Clean, Dry, Intact  ?Dressing Change Frequency PRN PRN  ?Site / Wound Assessment Clean;Dry Clean;Dry  ?Closure None None  ?Drainage Amount None None  ?Treatment -- Other (Comment)  ?   ?No Linked orders to display  ? ? ?Discharge Exam:  ? ?Vitals:  ? 05/20/21 2312 05/21/21 0311 05/21/21 0748 05/21/21 1141  ?BP: 123/79 118/82 124/75 112/71  ?Pulse: 91 90 93 96  ?Resp: $Remov'20 20 13 18  'gqwIEn$ ?Temp: (!) 97.4 ?F (36.3 ?C) 97.6 ?F (36.4 ?C) 98.3 ?F (36.8 ?C) 97.7 ?F (36.5 ?C)  ?TempSrc: Oral Oral Oral Oral  ?SpO2: 90% 94% 91% 98%  ?Weight:      ?Height:      ? ? ?Body mass index is 44.62 kg/m?.  ?General exam: Pleasant, middle-aged Caucasian male.  Not in physical  distress ?Skin: No rashes, lesions or ulcers. ?HEENT: Atraumatic, normocephalic, no obvious bleeding ?Lungs: Clear to auscultation bilaterally ?CVS: Regular rate and rhythm, no murmur ?GI/Abd soft, nontender, nondistended, bowel sound present ?CNS: Alert, awake, oriented x3 ?Psychiatry: Mood appropriate ?Extremities: No pedal edema, no calf tenderness ? ?Follow ups:  ? ? Follow-up Information   ? ? Michael Lemons, PA Follow up.   ?Specialty: Physician Assistant ?Contact information: ?[address] ?[place] [address] ?[phone] ? ? ?  ?  ? ?  ?  ? ?  ? ? ?Discharge Instructions:  ? ?Discharge Instructions   ? ? [facility] pharmacist to adjust dose for Vancomycin,  Aminoglycosides and other anti-infective therapies as requested by physician.   Complete by: As directed ?  ? [facility] to provide Cath Flo 2mg    Complete by: As directed ?  ? Administer for PICC line occlusion and as ordered by physician for other access device issues.  ? Anaphylaxis Kit: Provided to treat any anaphylactic reaction to the medication being provided to the patient if First Dose or when requested by physician   Complete by: As directed ?  ? Epinephrine 1mg /ml vial / amp: Administer 0.3mg  (0.69ml) subcutaneously once for moderate to severe anaphylaxis, nurse to call physician and pharmacy when reaction occurs and call 911 if needed for immediate care  ? Diphenhydramine 50mg /ml IV vial: Administer 25-50mg  IV/IM PRN for first dose reaction, rash, itching, mild reaction, nurse to call physician and pharmacy when reaction occurs  ? Sodium Chloride 0.9% NS 546ml IV: Administer if needed for hypovolemic blood pressure drop or as ordered by physician after call to physician with anaphylactic reaction  ? Call MD for:  difficulty breathing, headache or visual disturbances   Complete by: As directed ?  ? Call MD for:  extreme fatigue   Complete by: As directed ?  ? Call MD for:  hives   Complete by: As directed ?  ? Call MD for:  persistant dizziness or light-headedness   Complete by: As directed ?  ? Call MD for:  persistant nausea and vomiting   Complete by: As directed ?  ? Call MD for:  severe uncontrolled pain   Complete by: As directed ?  ? Call MD for:  temperature >100.4   Complete by: As directed ?  ? Change dressing on IV access line weekly and PRN   Complete by: As directed ?  ? Diet general   Complete by: As directed ?  ? Discharge instructions   Complete by: As directed ?  ? General discharge instructions: ?Follow with Primary MD Michael Lemons, PA in 7 days  ?Please request your PCP  to go over your hospital tests, procedures, radiology results at the follow up. Please get your  medicines reviewed and adjusted.  Your PCP may decide to repeat certain labs or tests as needed. ?Do not drive, operate heavy machinery, perform activities at heights, swimming or participation in water activities or provide baby sitting services if your were admitted for syncope or siezures until you have seen by Primary MD or a Neurologist and advised to do so again. ?[place] Controlled Substance Reporting System database was reviewed. Do not drive, operate heavy machinery, perform activities at heights, swim, participate in water activities or provide baby-sitting services while on medications for pain, sleep and mood until your outpatient physician has reevaluated you and advised to do so again.  You are strongly recommended to comply with the  dose, frequency and duration of prescribed medications. ?Activity: As tolerated with Full fall precautions use walker/cane & assistance as needed ?Avoid using any recreational substances like cigarette, tobacco, alcohol, or non-prescribed drug. ?If you experience worsening of your admission symptoms, develop shortness of breath, life threatening emergency, suicidal or homicidal thoughts you must seek medical attention immediately by calling 911 or calling your MD immediately  if symptoms less severe. ?You must read complete instructions/literature along with all the possible adverse reactions/side effects for all the medicines you take and that have been prescribed to you. Take any new medicine only after you have completely understood and accepted all the possible adverse reactions/side effects.  ?Wear Seat belts while driving. ?You were cared for by a hospitalist during your hospital stay. If you have any questions about your discharge medications or the care you received while you were in the hospital after you are discharged, you can call the unit and ask to speak with the hospitalist or the covering physician. Once you are discharged, your primary care  physician will handle any further medical issues. Please note that NO REFILLS for any discharge medications will be authorized once you are discharged, as it is imperative that you return to your primary care physician (

## 2021-05-21 NOTE — TOC Transition Note (Addendum)
Transition of Care (TOC) - CM/SW Discharge Note ? ? ?Patient Details  ?Name: Michael [name] ?MRN: [id number] ?Date of Birth: [birth year] ? ?Transition of Care (TOC) CM/SW Contact:  ?Angelita Ingles, RN ?Phone Number:[phone] ? ?05/21/2021, 8:54 AM ? ? ?Clinical Narrative:    ?CM at bedside to discuss [facility] options  with patient and wife. Patient made aware that [facility] options are limited due to patients insurance plan. Patient has been made aware that [facility] RN has been set up with [facility].  Patient and wife state that they will be fine without HH PT. Patient states that he is already ambulating and intends to continue. Wife states that she will be in the home to assist. MD has been updated. Bariatric rolling walker has been ordered per [employer] and will be delivered to the bedside. No other needs noted at this time. TOC will continue to follow for any other disposition needs.  ? ?0920 Carolynn Sayers with [employer] at bedside with wife and patient for IV home antibiotic infusion. Requesting that nurse administer IV antibiotic dose today before d/c and [facility] will follow up for tomorrows  dose. Bedside nurse has been made aware. CM has requested MD to sign OPAT orders. No other TOC needs. TOC will sign off.  ? ?1315 Patient refused Bariatric walker stating that his mother has one that he can use.  ? ? ?  ?  ? ? ?Patient Goals and CMS Choice ?  ?  ?  ? ?Discharge Placement ?  ?           ?  ?  ?  ?  ? ?Discharge Plan and Services ?  ?  ?           ?  ?  ?  ?  ?  ?  ?  ?  ?  ?  ? ?Social Determinants of Health (SDOH) Interventions ?  ? ? ?Readmission Risk Interventions ?   ? View : No data to display.  ?  ?  ?  ? ? ? ? ? ?

## 2021-05-23 ENCOUNTER — Telehealth: Payer: Self-pay | Admitting: Internal Medicine

## 2021-05-23 ENCOUNTER — Telehealth: Payer: Self-pay

## 2021-05-23 ENCOUNTER — Other Ambulatory Visit: Payer: Self-pay

## 2021-05-23 ENCOUNTER — Emergency Department (HOSPITAL_COMMUNITY)
Admission: EM | Admit: 2021-05-23 | Discharge: 2021-05-24 | Disposition: A | Discharge status: Home / Self Care | Payer: BC Managed Care – PPO | Attending: Emergency Medicine | Admitting: Emergency Medicine

## 2021-05-23 ENCOUNTER — Encounter (HOSPITAL_COMMUNITY): Payer: Self-pay

## 2021-05-23 DIAGNOSIS — R000 Tachycardia, unspecified: Secondary | ICD-10-CM | POA: Diagnosis not present

## 2021-05-23 DIAGNOSIS — M4644 Discitis, unspecified, thoracic region: Secondary | ICD-10-CM | POA: Insufficient documentation

## 2021-05-23 DIAGNOSIS — E876 Hypokalemia: Secondary | ICD-10-CM | POA: Insufficient documentation

## 2021-05-23 DIAGNOSIS — R112 Nausea with vomiting, unspecified: Secondary | ICD-10-CM | POA: Insufficient documentation

## 2021-05-23 DIAGNOSIS — Z79899 Other long term (current) drug therapy: Secondary | ICD-10-CM | POA: Diagnosis not present

## 2021-05-23 DIAGNOSIS — E860 Dehydration: Secondary | ICD-10-CM | POA: Diagnosis not present

## 2021-05-23 HISTORY — DX: Essential (primary) hypertension: I10

## 2021-05-23 LAB — CBC WITH DIFFERENTIAL/PLATELET
Abs Immature Granulocytes: 0.32 10*3/uL — ABNORMAL HIGH (ref 0.00–0.07)
Basophils Absolute: 0.1 10*3/uL (ref 0.0–0.1)
Basophils Relative: 1 %
Eosinophils Absolute: 0 10*3/uL (ref 0.0–0.5)
Eosinophils Relative: 0 %
HCT: 48.4 % (ref 39.0–52.0)
Hemoglobin: 15.9 g/dL (ref 13.0–17.0)
Immature Granulocytes: 2 %
Lymphocytes Relative: 4 %
Lymphs Abs: 0.8 10*3/uL (ref 0.7–4.0)
MCH: 28.9 pg (ref 26.0–34.0)
MCHC: 32.9 g/dL (ref 30.0–36.0)
MCV: 88 fL (ref 80.0–100.0)
Monocytes Absolute: 1.5 10*3/uL — ABNORMAL HIGH (ref 0.1–1.0)
Monocytes Relative: 8 %
Neutro Abs: 15.3 10*3/uL — ABNORMAL HIGH (ref 1.7–7.7)
Neutrophils Relative %: 85 %
Platelets: 256 10*3/uL (ref 150–400)
RBC: 5.5 MIL/uL (ref 4.22–5.81)
RDW: 13.7 % (ref 11.5–15.5)
WBC: 18 10*3/uL — ABNORMAL HIGH (ref 4.0–10.5)
nRBC: 0 % (ref 0.0–0.2)

## 2021-05-23 LAB — AEROBIC/ANAEROBIC CULTURE W GRAM STAIN (SURGICAL/DEEP WOUND)

## 2021-05-23 LAB — COMPREHENSIVE METABOLIC PANEL
ALT: 29 U/L (ref 0–44)
AST: 32 U/L (ref 15–41)
Albumin: 3.3 g/dL — ABNORMAL LOW (ref 3.5–5.0)
Alkaline Phosphatase: 104 U/L (ref 38–126)
Anion gap: 15 (ref 5–15)
BUN: 27 mg/dL — ABNORMAL HIGH (ref 6–20)
CO2: 18 mmol/L — ABNORMAL LOW (ref 22–32)
Calcium: 9.2 mg/dL (ref 8.9–10.3)
Chloride: 99 mmol/L (ref 98–111)
Creatinine, Ser: 1.43 mg/dL — ABNORMAL HIGH (ref 0.61–1.24)
GFR, Estimated: 56 mL/min — ABNORMAL LOW (ref >60)
Glucose, Bld: 151 mg/dL — ABNORMAL HIGH (ref 70–99)
Potassium: 3.4 mmol/L — ABNORMAL LOW (ref 3.5–5.1)
Sodium: 132 mmol/L — ABNORMAL LOW (ref 135–145)
Total Bilirubin: 1.5 mg/dL — ABNORMAL HIGH (ref 0.3–1.2)
Total Protein: 8.4 g/dL — ABNORMAL HIGH (ref 6.5–8.1)

## 2021-05-23 LAB — LACTIC ACID, PLASMA: Lactic Acid, Venous: 1.4 mmol/L (ref 0.5–1.9)

## 2021-05-23 LAB — LIPASE, BLOOD: Lipase: 42 U/L (ref 11–51)

## 2021-05-23 MED ORDER — ONDANSETRON HCL 4 MG/2ML IJ SOLN
4.0000 mg | Freq: Once | INTRAMUSCULAR | Status: AC
  Administered 2021-05-23: 4 mg via INTRAVENOUS
  Filled 2021-05-23: qty 2

## 2021-05-23 MED ORDER — ONDANSETRON HCL 8 MG PO TABS: 8.0000 mg | ORAL_TABLET | Freq: Three times a day (TID) | ORAL | 0 refills | Status: DC | PRN

## 2021-05-23 MED ORDER — SODIUM CHLORIDE 0.9 % IV BOLUS
1000.0000 mL | Freq: Once | INTRAVENOUS | Status: AC
  Administered 2021-05-24: 1000 mL via INTRAVENOUS

## 2021-05-23 MED ORDER — PROMETHAZINE HCL 12.5 MG RE SUPP: 12.5000 mg | Freq: Four times a day (QID) | RECTAL | 0 refills | Status: DC | PRN

## 2021-05-23 MED ORDER — METOCLOPRAMIDE HCL 5 MG/ML IJ SOLN
10.0000 mg | Freq: Once | INTRAMUSCULAR | Status: AC
  Administered 2021-05-24: 10 mg via INTRAVENOUS
  Filled 2021-05-23: qty 2

## 2021-05-23 MED FILL — Hydromorphone HCl Inj 1 MG/ML: INTRAMUSCULAR | Qty: 1 | Status: AC

## 2021-05-23 NOTE — Addendum Note (Signed)
Addended byRutha Bouchard T on: 05/23/2021 12:11 PM ? ? Modules accepted: Orders ? ?

## 2021-05-23 NOTE — Telephone Encounter (Signed)
Patient with nausea. Likely due to cefazolin ? ?Zofran 4 mg doesn't seem to help ?Refused appointment this week ? ? ?Asking for zofran 8 mg... rx placed ?

## 2021-05-23 NOTE — Telephone Encounter (Signed)
Patient wife Michael [name]) called to report patient has been vomiting since discharged from hospital on 3/22. Patient is unable to eat anything due to nausea and vomiting. He is taking Zofran but still vomiting a lot. Patient wife is concerned it is due to either Cefazolin or E-coli. [name] is wondering if Zofran needs to be increased. Please advise.  ? ? ?Bita Cartwright P Ramiz Turpin, CMA ? ?

## 2021-05-23 NOTE — Telephone Encounter (Signed)
Patient wife aware of Zofran 8 mg sent to pharmacy. Contacted [facility] and canceled RX for Phenergan suppositories. ? ? ?Kento Gossman P Rachael Zapanta, CMA ? ?

## 2021-05-23 NOTE — ED Provider Notes (Addendum)
?[facility] EMERGENCY DEPARTMENT ?Provider Note ? ? ?CSN: [id number] ?Arrival date & time: 05/23/21  2140 ? ?  ? ?History ? ?Chief Complaint  ?Patient presents with  ? Emesis  ? ? ?Michael [name] is a 61 y.o. male. ? ?Patient currently being treated via PICC line/iv abx for thoracic discitis, presents with nausea/vomiting. Indicates did have all his normal abx doses accept has not yet had  tonights dose. Indicates similar nausea/vomiting when recently in hospital - d/cd 2 days ago. Took zofran earlier. Emesis clear, no bloody or bilious emesis. No abd pain or distension. Had normal bm today. No dysuria or gu c/o. No chest pain or discomfort of any sort. No sob or unusual doe. No new cough or uri symptoms. No fever or chills. No headache. No extremity pain or swelling.  ? ?The history is provided by a relative and medical records.  ?Emesis ?Associated symptoms: no abdominal pain, no fever, no headaches and no sore throat   ? ?  ? ?Home Medications ?Prior to Admission medications   ?Medication Sig Start Date End Date Taking? Authorizing Provider  ?Aspirin-Salicylamide-Caffeine (BC HEADACHE POWDER PO) Take by mouth.    [provider]  ?calcium carbonate (TUMS - DOSED IN MG ELEMENTAL CALCIUM) 500 MG chewable tablet Chew 1 tablet by mouth daily.    [provider]  ?ceFAZolin (ANCEF) IVPB Inject 2 g into the vein every 8 (eight) hours. Indication:  E coli bacteremia/discitis ?First Dose: Yes ?Last Day of Therapy:  06/27/21 ?Labs - Once weekly:  CBC/D and BMP, ?Labs - Every other week:  ESR and CRP ?Method of administration: IV Push ?Method of administration may be changed at the discretion of home infusion pharmacist based upon assessment of the patient and/or caregiver's ability to self-administer the medication ordered. 05/21/21 06/28/21  Lorin Glass, MD  ?esomeprazole (NEXIUM) 20 MG capsule Take 20 mg by mouth daily at 12 noon.    [provider]  ?HYDROcodone-acetaminophen (NORCO/VICODIN)  5-325 MG tablet Take 2 tablets by mouth every 6 (six) hours as needed for up to 5 days. 05/21/21 05/26/21  Lorin Glass, MD  ?ondansetron (ZOFRAN) 8 MG tablet Take 1 tablet (8 mg total) by mouth every 8 (eight) hours as needed for nausea or vomiting. 05/23/21   Vu, Tonita Phoenix, MD  ?promethazine (PHENERGAN) 12.5 MG suppository Place 1 suppository (12.5 mg total) rectally every 6 (six) hours as needed for up to 7 days for nausea or vomiting. 05/23/21 05/30/21  Vu, Gershon Mussel T, MD  ?triamterene-hydrochlorothiazide (MAXZIDE-25) 37.5-25 MG tablet Take 1 tablet by mouth daily. 04/24/21   [provider]  ?   ? ?Allergies    ?Patient has no known allergies.   ? ?Review of Systems   ?Review of Systems  ?Constitutional:  Negative for fever.  ?HENT:  Negative for sore throat.   ?Eyes:  Negative for redness.  ?Respiratory:  Negative for shortness of breath.   ?Cardiovascular:  Negative for chest pain.  ?Gastrointestinal:  Positive for nausea and vomiting. Negative for abdominal pain.  ?Genitourinary:  Negative for dysuria and flank pain.  ?Musculoskeletal:  Negative for back pain and neck pain.  ?Skin:  Negative for rash.  ?Neurological:  Negative for headaches.  ?Hematological:  Does not bruise/bleed easily.  ?Psychiatric/Behavioral:  Negative for confusion.   ? ?Physical Exam ?Updated Vital Signs ?BP 139/90   Pulse (!) 107   Temp 98.7 ?F (37.1 ?C) (Oral)   Resp 17   Ht 1.829 m (6')  Wt (!) 149.2 kg   SpO2 91%   BMI 44.62 kg/m?  ?Physical Exam ?Vitals and nursing note reviewed.  ?Constitutional:   ?   Appearance: Normal appearance. He is well-developed.  ?HENT:  ?   Head: Atraumatic.  ?   Nose: Nose normal.  ?   Mouth/Throat:  ?   Mouth: Mucous membranes are moist.  ?   Pharynx: Oropharynx is clear.  ?Eyes:  ?   General: No scleral icterus. ?   Conjunctiva/sclera: Conjunctivae normal.  ?   Pupils: Pupils are equal, round, and reactive to light.  ?Neck:  ?   Trachea: No tracheal deviation.  ?   Comments: No stiffness or  rigidity.  ?Cardiovascular:  ?   Rate and Rhythm: Regular rhythm. Tachycardia present.  ?   Pulses: Normal pulses.  ?   Heart sounds: Normal heart sounds. No murmur heard. ?  No friction rub. No gallop.  ?Pulmonary:  ?   Effort: Pulmonary effort is normal. No accessory muscle usage or respiratory distress.  ?   Breath sounds: Normal breath sounds.  ?Abdominal:  ?   General: Bowel sounds are normal. There is no distension.  ?   Palpations: Abdomen is soft. There is no mass.  ?   Tenderness: There is no abdominal tenderness. There is no guarding.  ?Genitourinary: ?   Comments: No cva tenderness. ?Musculoskeletal:     ?   General: No swelling or tenderness.  ?   Cervical back: Normal range of motion and neck supple. No rigidity.  ?   Right lower leg: No edema.  ?   Left lower leg: No edema.  ?   Comments: Picc line right arm without sign of infection.   ?Skin: ?   General: Skin is warm and dry.  ?   Findings: No rash.  ?Neurological:  ?   Mental Status: He is alert.  ?   Comments: Alert, speech clear. Motor/sens grossly intact bil.   ?Psychiatric:     ?   Mood and Affect: Mood normal.  ? ? ?ED Results / Procedures / Treatments   ?Labs ?(all labs ordered are listed, but only abnormal results are displayed) ?Results for orders placed or performed during the hospital encounter of 05/23/21  ?CBC with Differential  ?Result Value Ref Range  ? WBC 18.0 (H) 4.0 - 10.5 K/uL  ? RBC 5.50 4.22 - 5.81 MIL/uL  ? Hemoglobin 15.9 13.0 - 17.0 g/dL  ? HCT 48.4 39.0 - 52.0 %  ? MCV 88.0 80.0 - 100.0 fL  ? MCH 28.9 26.0 - 34.0 pg  ? MCHC 32.9 30.0 - 36.0 g/dL  ? RDW 13.7 11.5 - 15.5 %  ? Platelets 256 150 - 400 K/uL  ? nRBC 0.0 0.0 - 0.2 %  ? Neutrophils Relative % 85 %  ? Neutro Abs 15.3 (H) 1.7 - 7.7 K/uL  ? Lymphocytes Relative 4 %  ? Lymphs Abs 0.8 0.7 - 4.0 K/uL  ? Monocytes Relative 8 %  ? Monocytes Absolute 1.5 (H) 0.1 - 1.0 K/uL  ? Eosinophils Relative 0 %  ? Eosinophils Absolute 0.0 0.0 - 0.5 K/uL  ? Basophils Relative 1 %  ?  Basophils Absolute 0.1 0.0 - 0.1 K/uL  ? Immature Granulocytes 2 %  ? Abs Immature Granulocytes 0.32 (H) 0.00 - 0.07 K/uL  ?Comprehensive metabolic panel  ?Result Value Ref Range  ? Sodium 132 (L) 135 - 145 mmol/L  ? Potassium 3.4 (L) 3.5 - 5.1 mmol/L  ?  Chloride 99 98 - 111 mmol/L  ? CO2 18 (L) 22 - 32 mmol/L  ? Glucose, Bld 151 (H) 70 - 99 mg/dL  ? BUN 27 (H) 6 - 20 mg/dL  ? Creatinine, Ser 1.43 (H) 0.61 - 1.24 mg/dL  ? Calcium 9.2 8.9 - 10.3 mg/dL  ? Total Protein 8.4 (H) 6.5 - 8.1 g/dL  ? Albumin 3.3 (L) 3.5 - 5.0 g/dL  ? AST 32 15 - 41 U/L  ? ALT 29 0 - 44 U/L  ? Alkaline Phosphatase 104 38 - 126 U/L  ? Total Bilirubin 1.5 (H) 0.3 - 1.2 mg/dL  ? GFR, Estimated 56 (L) >60 mL/min  ? Anion gap 15 5 - 15  ?Lipase, blood  ?Result Value Ref Range  ? Lipase 42 11 - 51 U/L  ?Urinalysis, Routine w reflex microscopic Urine, Clean Catch  ?Result Value Ref Range  ? Color, Urine AMBER (A) YELLOW  ? APPearance CLEAR CLEAR  ? Specific Gravity, Urine 1.024 1.005 - 1.030  ? pH 5.0 5.0 - 8.0  ? Glucose, UA NEGATIVE NEGATIVE mg/dL  ? Hgb urine dipstick NEGATIVE NEGATIVE  ? Bilirubin Urine NEGATIVE NEGATIVE  ? Ketones, ur 20 (A) NEGATIVE mg/dL  ? Protein, ur 100 (A) NEGATIVE mg/dL  ? Nitrite NEGATIVE NEGATIVE  ? Leukocytes,Ua NEGATIVE NEGATIVE  ? RBC / HPF 0-5 0 - 5 RBC/hpf  ? WBC, UA 6-10 0 - 5 WBC/hpf  ? Bacteria, UA RARE (A) NONE SEEN  ? Squamous Epithelial / LPF 0-5 0 - 5  ? Mucus PRESENT   ? Hyaline Casts, UA PRESENT   ?Lactic acid, plasma  ?Result Value Ref Range  ? Lactic Acid, Venous 1.4 0.5 - 1.9 mmol/L  ? ?DG Abd 1 View ? ?Result Date: 05/20/2021 ?CLINICAL DATA:  No bowel movement in 2 weeks. EXAM: ABDOMEN - 1 VIEW COMPARISON:  CT abdomen pelvis dated May 16, 2021. FINDINGS: The bowel gas pattern is normal. Overall normal colonic stool burden with mild amount of stool in the right colon. No radio-opaque calculi or other significant radiographic abnormality are seen. IMPRESSION: 1. Negative. Electronically Signed   By:  Titus Dubin M.D.   On: 05/20/2021 14:28  ? ?CT Angio Chest PE W and/or Wo Contrast ? ?Result Date: 05/16/2021 ?CLINICAL DATA:  Intermittent mid to low back pain for the past week. EXAM: CT ANGIOGRAPHY CHEST CT

## 2021-05-23 NOTE — ED Triage Notes (Signed)
Pt arrived from home via POV with family member who reports Pt has had multiple episodes of emesis. Pt recently discharged from [facility] on 05/21/2021. Pt has PICC line in place in [facility] to receive antibiotics at home. Family reports Pt's next dose of antibiotics is due at 2230 and they left it at home. Pt reports increasing weakness and has difficulty ambulating.  ?

## 2021-05-24 ENCOUNTER — Emergency Department (HOSPITAL_COMMUNITY): Payer: BC Managed Care – PPO

## 2021-05-24 LAB — URINALYSIS, ROUTINE W REFLEX MICROSCOPIC
Bilirubin Urine: NEGATIVE
Glucose, UA: NEGATIVE mg/dL
Hgb urine dipstick: NEGATIVE
Ketones, ur: 20 mg/dL — AB
Leukocytes,Ua: NEGATIVE
Nitrite: NEGATIVE
Protein, ur: 100 mg/dL — AB
Specific Gravity, Urine: 1.024 (ref 1.005–1.030)
pH: 5 (ref 5.0–8.0)

## 2021-05-24 IMAGING — US US ABDOMEN LIMITED
1 series · 14 of 25 positions shown · non-contrast
Comparison: CT done on [DATE]

CLINICAL DATA: Gallbladder stones

EXAM:
ULTRASOUND ABDOMEN LIMITED RIGHT UPPER QUADRANT

[Series 1: us abdomen limited ruq (liver/gb) · 14 of 70 slices shown]
[im 1/70]
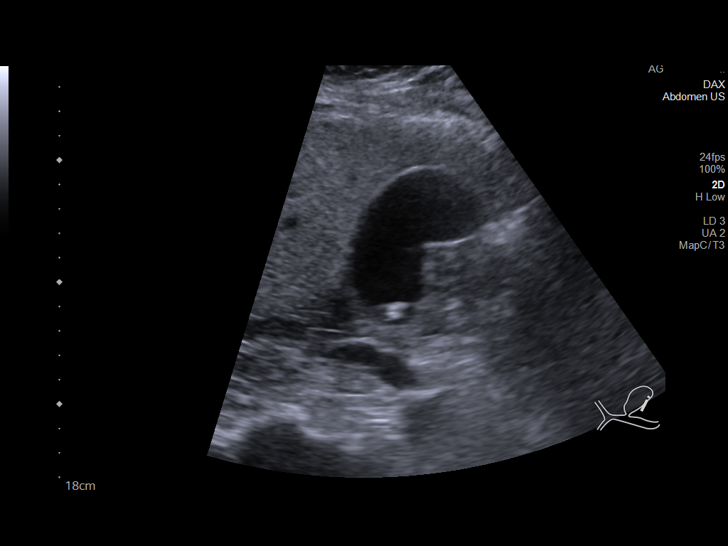
[im 6/70]
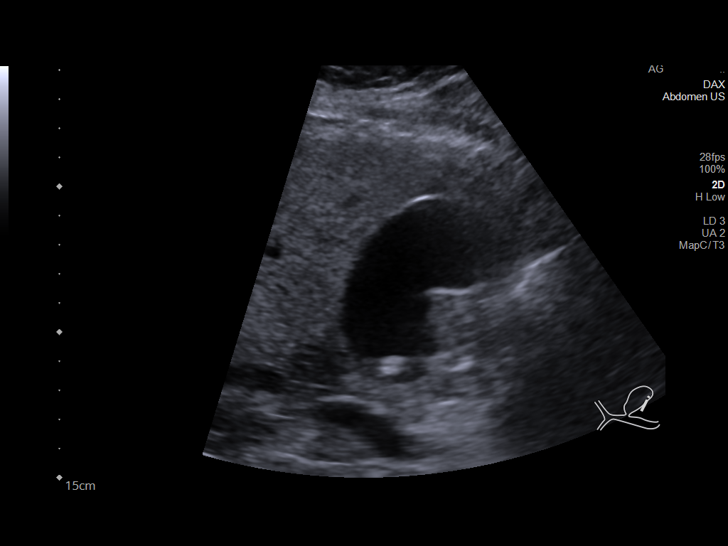
[im 12/70]
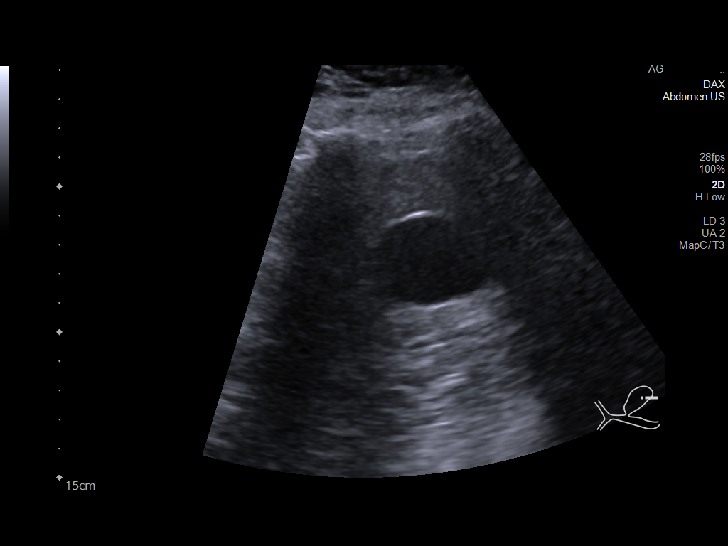
[im 18/70]
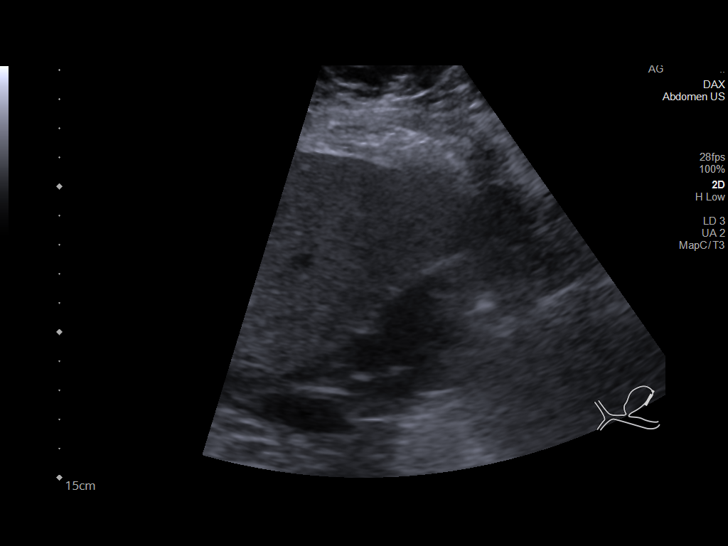
[im 24/70]
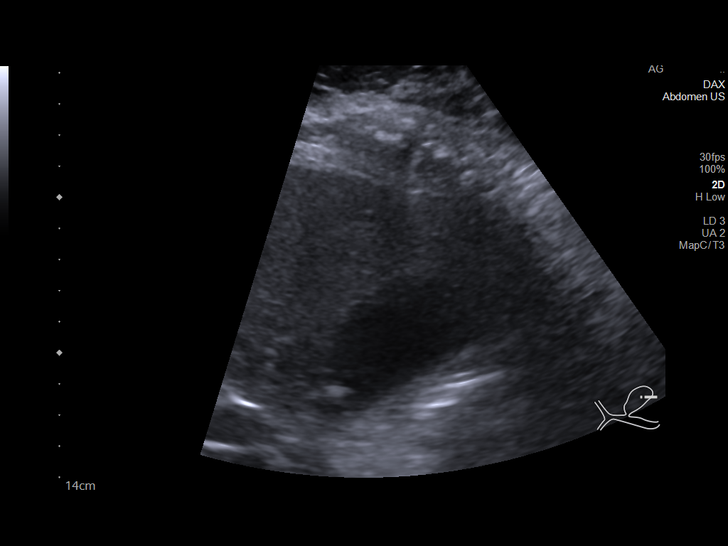
[im 26/70]
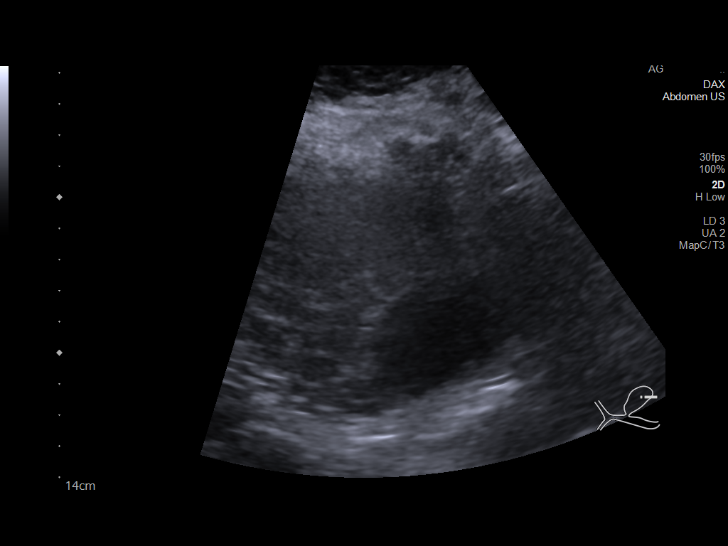
[im 32/70]
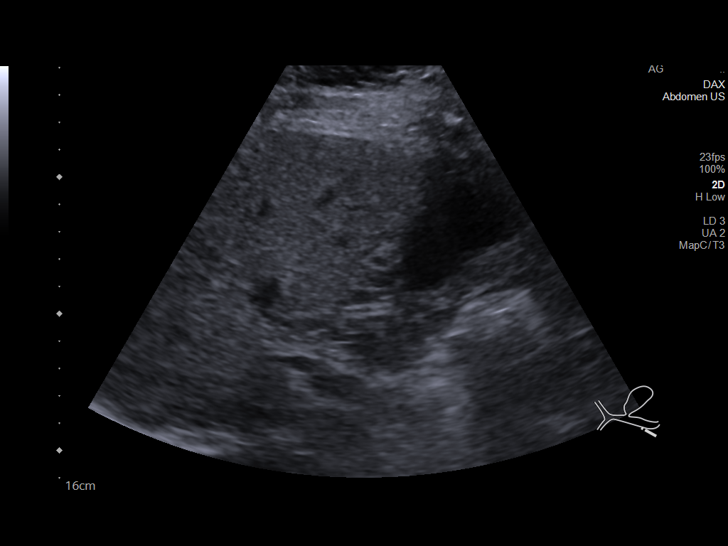
[im 38/70]
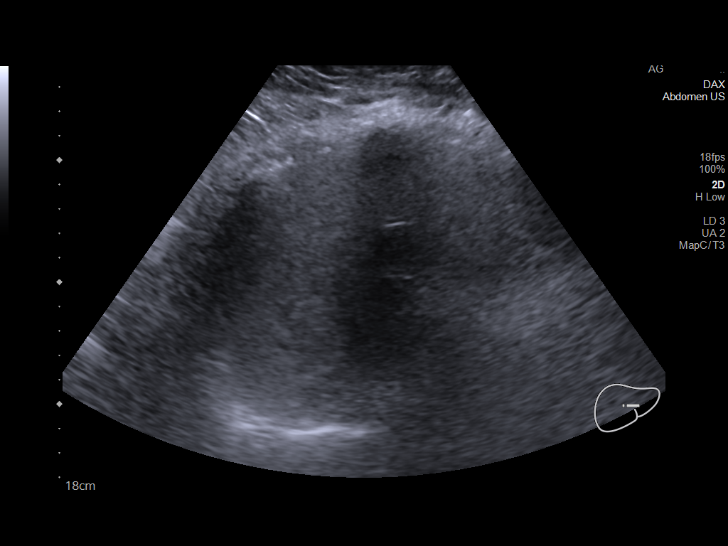
[im 44/70]
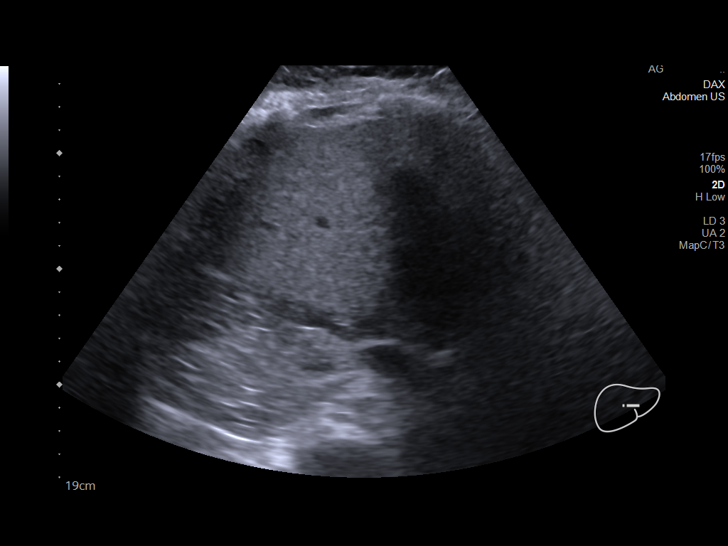
[im 47/70]
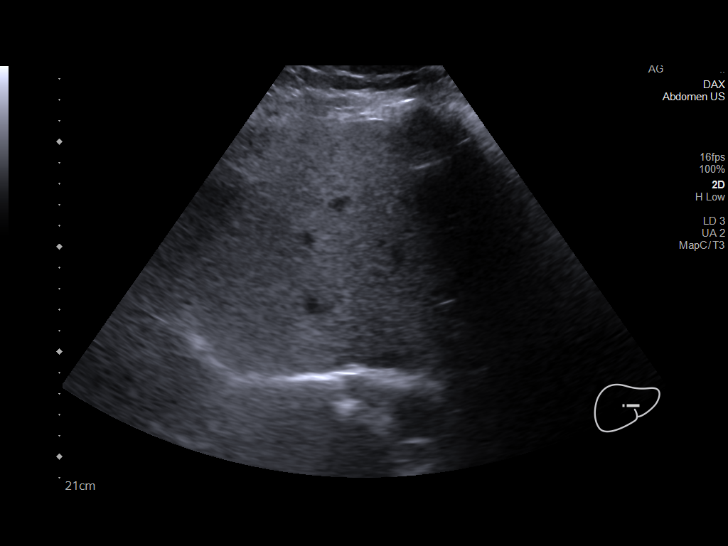
[im 52/70]
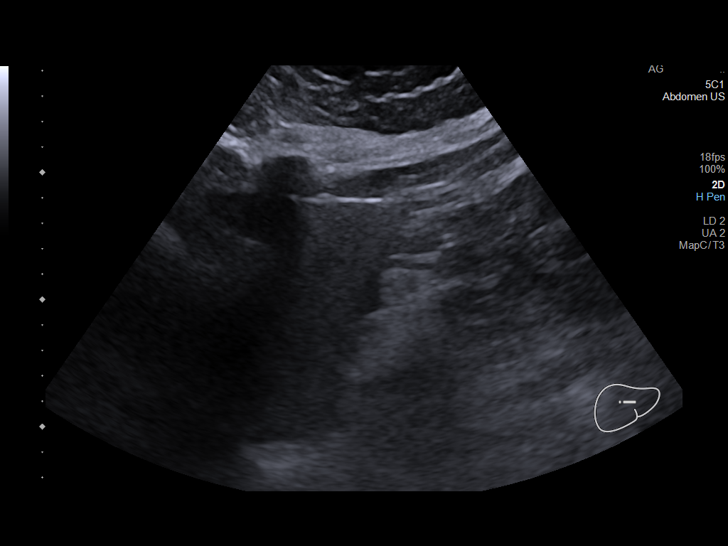
[im 58/70]
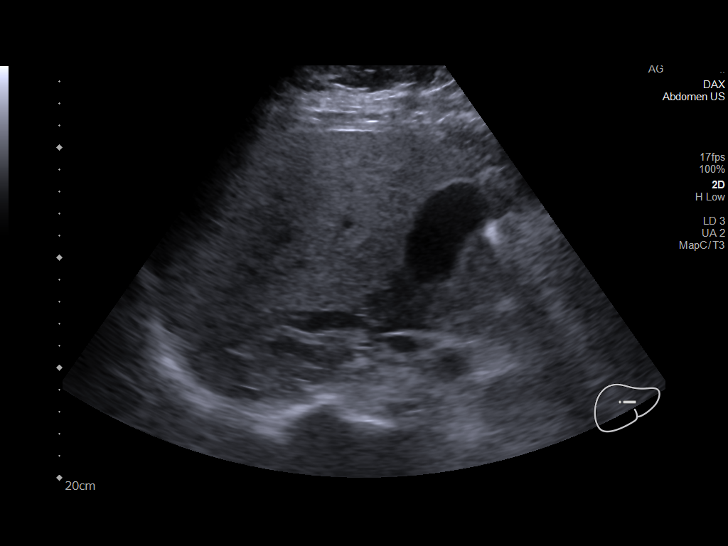
[im 64/70]
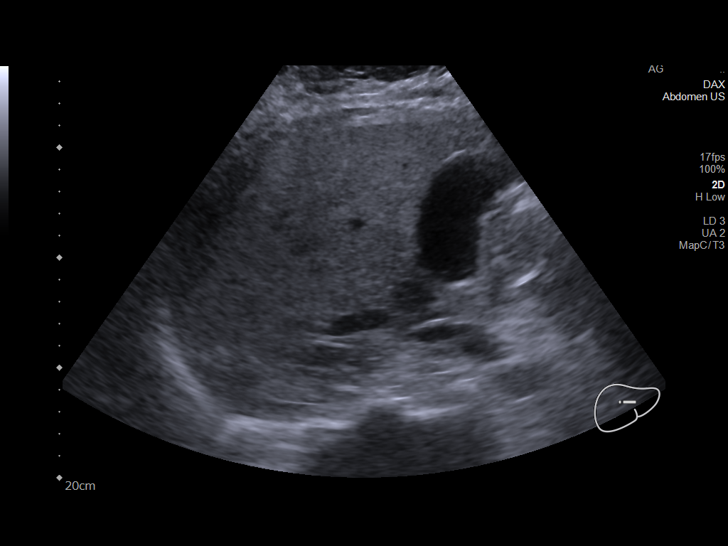
[im 70/70]
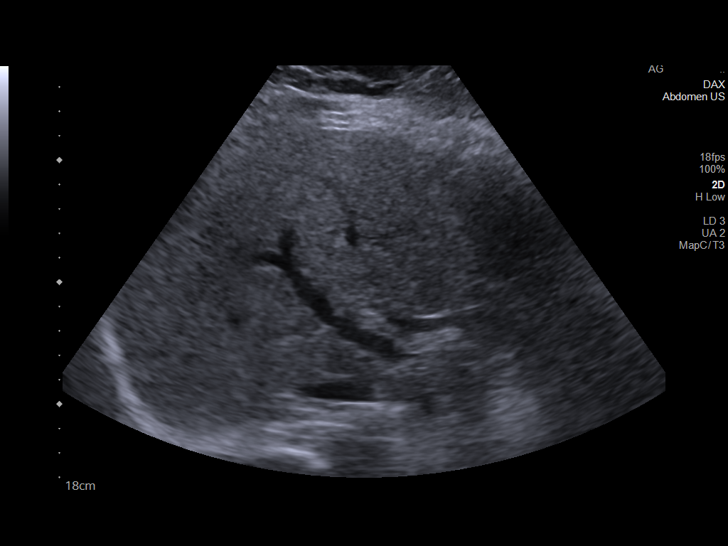

[14 of 25 positions shown; findings below may reference images not displayed]

FINDINGS: Gallbladder:

Small hyperechoic foci are noted in the dependent portion of
gallbladder suggesting gallbladder stones. Technologist did not
observe any tenderness over the gallbladder. There is no wall
thickening in gallbladder. There is no fluid around the gallbladder.

Common bile duct:

Diameter: 3 mm

Liver:

There is increased echogenicity in the liver. No focal abnormality
is seen. Portal vein is patent on color Doppler imaging with normal
direction of blood flow towards the liver.

Other: None.
IMPRESSION: Gallbladder stones. There are no signs of acute cholecystitis. Fatty
liver.

## 2021-05-24 MED ORDER — CEFAZOLIN SODIUM-DEXTROSE 2-4 GM/100ML-% IV SOLN
2.0000 g | Freq: Once | INTRAVENOUS | Status: AC
  Administered 2021-05-24: 2 g via INTRAVENOUS
  Filled 2021-05-24: qty 100

## 2021-05-24 MED ORDER — LACTATED RINGERS IV BOLUS
500.0000 mL | Freq: Once | INTRAVENOUS | Status: AC
  Administered 2021-05-24: 500 mL via INTRAVENOUS

## 2021-05-24 MED ORDER — POTASSIUM CHLORIDE 20 MEQ PO PACK
40.0000 meq | PACK | Freq: Once | ORAL | Status: AC
  Administered 2021-05-24: 40 meq via ORAL
  Filled 2021-05-24: qty 2

## 2021-05-24 MED ORDER — LORAZEPAM 2 MG/ML IJ SOLN
0.5000 mg | Freq: Once | INTRAMUSCULAR | Status: AC
  Administered 2021-05-24: 0.5 mg via INTRAVENOUS
  Filled 2021-05-24: qty 1

## 2021-05-24 MED ORDER — LACTATED RINGERS IV BOLUS
1000.0000 mL | Freq: Once | INTRAVENOUS | Status: AC
  Administered 2021-05-24: 1000 mL via INTRAVENOUS

## 2021-05-24 MED ORDER — POTASSIUM CHLORIDE CRYS ER 20 MEQ PO TBCR
40.0000 meq | EXTENDED_RELEASE_TABLET | Freq: Once | ORAL | Status: DC
  Filled 2021-05-24: qty 2

## 2021-05-24 MED ORDER — PANTOPRAZOLE SODIUM 40 MG IV SOLR
40.0000 mg | Freq: Once | INTRAVENOUS | Status: AC
  Administered 2021-05-24: 40 mg via INTRAVENOUS
  Filled 2021-05-24: qty 10

## 2021-05-24 NOTE — Discharge Instructions (Addendum)
It was our pleasure to provide your ER care today - we hope that you feel better. ? ?Drink plenty of fluids/stay well hydrated.  ? ?Take zofran as need for nausea.  ? ?From today's labs your potassium level is slightly low - eat plenty of fruits and vegetables, and follow up with your doctor.  ? ?Follow up with primary care doctor this Monday for recheck.  ? ?Return to ER if worse, new symptoms, fevers, new/severe pain, severe abdominal pain, persistent vomiting/not tolerating fluids, chest pain, trouble breathing, or other concern.  ?

## 2021-05-26 NOTE — Telephone Encounter (Signed)
Patient wife called to inform that patient hasn't improved since we spoke on Friday. Patient still having nausea and unable to eat. Patient went to ED on 3/24. Patient wife stated that while admitted patient was given phenergan and it seemed to help more than Zofran. Informed patient wife that Dr.Vu prescribed phenergan on Friday but it was a suppository. Patient wife requesting phenergan tablets. Patient is also having itching on his back. Concerned it could be due to abx and questioned if she could give him benadryl.  ? ? ?Minnette Merida P Simuel Stebner, CMA ? ?

## 2021-05-26 NOTE — Telephone Encounter (Signed)
Patient wife agreed - patient scheduled 3/29 at 2 pm. Thanks ?

## 2021-05-28 ENCOUNTER — Ambulatory Visit (INDEPENDENT_AMBULATORY_CARE_PROVIDER_SITE_OTHER): Payer: BC Managed Care – PPO | Admitting: Internal Medicine

## 2021-05-28 ENCOUNTER — Telehealth: Payer: Self-pay

## 2021-05-28 ENCOUNTER — Encounter: Payer: Self-pay | Admitting: Internal Medicine

## 2021-05-28 ENCOUNTER — Other Ambulatory Visit: Payer: Self-pay

## 2021-05-28 VITALS — BP 134/87 | HR 104 | Temp 97.6°F | Ht 72.0 in | Wt 329.0 lb

## 2021-05-28 DIAGNOSIS — R110 Nausea: Secondary | ICD-10-CM | POA: Diagnosis not present

## 2021-05-28 DIAGNOSIS — L299 Pruritus, unspecified: Secondary | ICD-10-CM

## 2021-05-28 MED ORDER — SODIUM CHLORIDE 0.9 % IV SOLN: 8.0000 mg | Freq: Once | INTRAVENOUS | Status: DC

## 2021-05-28 MED ORDER — ONDANSETRON HCL 4 MG/2ML IJ SOLN
8.0000 mg | Freq: Once | INTRAMUSCULAR | Status: AC
  Administered 2021-05-28: 8 mg via INTRAVENOUS

## 2021-05-28 MED ORDER — PROMETHAZINE HCL 25 MG PO TABS: 25.0000 mg | ORAL_TABLET | Freq: Four times a day (QID) | ORAL | 0 refills | Status: DC | PRN

## 2021-05-28 MED ORDER — DIPHENHYDRAMINE HCL 50 MG/ML IJ SOLN
50.0000 mg | Freq: Once | INTRAMUSCULAR | Status: AC
  Administered 2021-05-28: 50 mg via INTRAVENOUS

## 2021-05-28 NOTE — Addendum Note (Signed)
Addended by: Primus Bravo E on: 05/28/2021 05:01 PM ? ? Modules accepted: Orders ? ?

## 2021-05-28 NOTE — Telephone Encounter (Signed)
Called patient to follow up on question from patient's spouse regarding provider phone number to contact about pain medication. No answer. Left message on identified voicemail, explaining that Dr. Renold Don recommended calling the primary provider to discuss questions about pain medication. Provided information for [facility], Family Medicine, [phone], per Dr. Orlando Penner chart review. ? ?Wyvonne Lenz, RN  ?

## 2021-05-28 NOTE — Patient Instructions (Signed)
Please try phenergan for nausea ? ?I'll review your labs and will let you know if your kidney is not doing well ? ?See [place] in 1 week (Wednesday or Thursday) or sooner if after 3 days phenergan doesn't help much ? ?Please check in with neurosurgery clinic and see if another pain medication can be prescribed ? ? ?

## 2021-05-28 NOTE — Telephone Encounter (Signed)
Called patient to follow up after medication administration at office visit today, per Dr. Renold Don. Patient's spouse answered call and patient was also on speaker phone.  ? ?Patient stated that he has not noticed any change in his symptoms following the administration of 8 mg Zofran IV and 50 mg Benadryl IV during his office visit today. ? ?Patient's wife stated that she picked up his prescription for Phenergan, and will administer when they get home, 30 minutes prior to giving the patient his antibiotics. ? ?Patient's wife requested information on phone number for new surgery clinic, in order to discuss pain medication. ? ?Dr. Renold Don informed of patient status.  ? ?Wyvonne Lenz, RN  ?

## 2021-05-28 NOTE — Progress Notes (Signed)
?  ? ? ? ? ?[facility] ? ?Patient Active Problem List  ? Diagnosis Date Noted  ? Gram-negative bacteremia 05/17/2021  ? Osteomyelitis ([place]) 05/16/2021  ? Essential hypertension 05/16/2021  ? Obesity, Class III, BMI 40-49.9 (morbid obesity) ([place]) 05/16/2021  ? ? ? ? ?Subjective:  ? ? Patient ID: Michael [name], male    DOB: August 15, 1960, 61 y.o.   MRN: [id number] ? ?Chief Complaint  ?Patient presents with  ? Hospitalization Follow-up  ? ? ?HPI: ? ?Michael [name] is a 61 y.o. male here for hospital f/u ecoli bacteremia/t8-9 osteo ? ?He is here earlier than advised due to severe nausea/vomiting ?Hasn't been able to keep anything down, feels tired ?Zofran not helping ?Reports while in hospital iv phenergan help ? ?Didn't want to try suppository phenergan ? ?Back pain moderate-severe not helped much with hydrocodone ?No f/c ?Picc site no issue ? ? ? ?No Known Allergies ? ? ? ?Outpatient Medications Prior to Visit  ?Medication Sig Dispense Refill  ? ceFAZolin (ANCEF) IVPB Inject 2 g into the vein every 8 (eight) hours. Indication:  E coli bacteremia/discitis ?First Dose: Yes ?Last Day of Therapy:  06/27/21 ?Labs - Once weekly:  CBC/D and BMP, ?Labs - Every other week:  ESR and CRP ?Method of administration: IV Push ?Method of administration may be changed at the discretion of home infusion pharmacist based upon assessment of the patient and/or caregiver's ability to self-administer the medication ordered. 114 Units 0  ? ondansetron (ZOFRAN) 8 MG tablet Take 1 tablet (8 mg total) by mouth every 8 (eight) hours as needed for nausea or vomiting. 20 tablet 0  ? Aspirin-Salicylamide-Caffeine (BC HEADACHE POWDER PO) Take by mouth. (Patient not taking: Reported on 05/28/2021)    ? calcium carbonate (TUMS - DOSED IN MG ELEMENTAL CALCIUM) 500 MG chewable tablet Chew 1 tablet by mouth daily. (Patient not taking: Reported on 05/28/2021)    ? esomeprazole (NEXIUM) 20 MG capsule Take 20 mg by mouth daily at 12  noon. (Patient not taking: Reported on 05/28/2021)    ? promethazine (PHENERGAN) 12.5 MG suppository Place 1 suppository (12.5 mg total) rectally every 6 (six) hours as needed for up to 7 days for nausea or vomiting. (Patient not taking: Reported on 05/28/2021) 30 each 0  ? triamterene-hydrochlorothiazide (MAXZIDE-25) 37.5-25 MG tablet Take 1 tablet by mouth daily. (Patient not taking: Reported on 05/28/2021)    ? ?No facility-administered medications prior to visit.  ? ? ? ?Social History  ? ?Socioeconomic History  ? Marital status: Married  ?  Spouse name: Not on file  ? Number of children: Not on file  ? Years of education: Not on file  ? Highest education level: Not on file  ?Occupational History  ? Not on file  ?Tobacco Use  ? Smoking status: Never  ? Smokeless tobacco: Never  ?Vaping Use  ? Vaping Use: Never used  ?Substance and Sexual Activity  ? Alcohol use: Not Currently  ? Drug use: Not Currently  ? Sexual activity: Not Currently  ?Other Topics Concern  ? Not on file  ?Social History Narrative  ? Not on file  ? ?Social Determinants of Health  ? ?Financial Resource Strain: Not on file  ?Food Insecurity: Not on file  ?Transportation Needs: Not on file  ?Physical Activity: Not on file  ?Stress: Not on file  ?Social Connections: Not on file  ?Intimate Partner Violence: Not on file  ? ? ? ? ?Review of Systems ?   ? ?  Objective:  ?  ?BP 134/87 Comment: L forearm, per patient request  Pulse (!) 104   Temp 97.6 ?F (36.4 ?C) (Oral)   Ht 6' (1.829 m)   Wt (!) 329 lb (149.2 kg) Comment: Stated  SpO2 95%   BMI 44.62 kg/m?  ?Nursing note and vital signs reviewed. ? ?Physical Exam ? ?   ?General/constitutional: no obvious distress but asking for something to help his nausea, pleasant; obese ?HEENT: Normocephalic, PER, Conj Clear, EOMI, Oropharynx clear ?Neck supple ?CV: rrr no mrg ?Lungs: clear to auscultation, normal respiratory effort ?Abd: Soft, Nontender ?Ext: no edema ?Skin: No Rash ?Neuro: nonfocal ?MSK: no  peripheral joint swelling/tenderness/warmth; back spines nontender ? ?Picc site no erythema/tenderness ? ?Labs: ?Lab Results  ?Component Value Date  ? WBC 18.0 (H) 05/23/2021  ? HGB 15.9 05/23/2021  ? HCT 48.4 05/23/2021  ? MCV 88.0 05/23/2021  ? PLT 256 05/23/2021  ? ?Last metabolic panel ?Lab Results  ?Component Value Date  ? GLUCOSE 151 (H) 05/23/2021  ? NA 132 (L) 05/23/2021  ? K 3.4 (L) 05/23/2021  ? CL 99 05/23/2021  ? CO2 18 (L) 05/23/2021  ? BUN 27 (H) 05/23/2021  ? CREATININE 1.43 (H) 05/23/2021  ? GFRNONAA 56 (L) 05/23/2021  ? CALCIUM 9.2 05/23/2021  ? PROT 8.4 (H) 05/23/2021  ? ALBUMIN 3.3 (L) 05/23/2021  ? BILITOT 1.5 (H) 05/23/2021  ? ALKPHOS 104 05/23/2021  ? AST 32 05/23/2021  ? ALT 29 05/23/2021  ? ANIONGAP 15 05/23/2021  ? ? ?Micro: ? ?Serology: ? ?Imaging: ? ?Assessment & Plan:  ? ?Problem List Items Addressed This Visit   ?None ?Visit Diagnoses   ? ? Nausea    -  Primary  ? Relevant Medications  ? ondansetron (ZOFRAN) 8 mg in sodium chloride 0.9 % 50 mL IVPB (Start on 05/28/2021  4:00 PM)  ? diphenhydrAMINE (BENADRYL) injection 50 mg (Start on 05/28/2021  4:00 PM)  ? Itching      ? Relevant Medications  ? diphenhydrAMINE (BENADRYL) injection 50 mg (Start on 05/28/2021  4:00 PM)  ? ?  ? ? ? ? ?No orders of the defined types were placed in this encounter. ? ? ?I do not have recent lab result from 3/28 yet. Will follow up ?Nausea likely abx and worsened with his pain ? ?In clinic 50 mg benadryl zofran 8 mg iv  ? ?-trial of oral phenergan ?-if nausea don't improve by next week, we can considering changing antibiotics ?-please discuss with the nsg clinic about your pain and hydrocodone not working well ?-if you can't wait till next week please call us earlier than next week ? ? ? ?Follow-up: Return in about 1 week (around 06/04/2021). ? ? ? ? ? ?Jabier Mutton, MD ?[facility] for [facility] ?[place] ?[phone]  pager   [phone] cell ?05/28/2021, 2:38 PM ? ?

## 2021-05-29 ENCOUNTER — Other Ambulatory Visit: Payer: Self-pay

## 2021-05-29 ENCOUNTER — Telehealth: Payer: Self-pay

## 2021-05-29 ENCOUNTER — Emergency Department (HOSPITAL_COMMUNITY)
Admission: EM | Admit: 2021-05-29 | Discharge: 2021-05-29 | Disposition: A | Discharge status: Home / Self Care | Payer: BC Managed Care – PPO | Attending: Emergency Medicine | Admitting: Emergency Medicine

## 2021-05-29 ENCOUNTER — Encounter (HOSPITAL_COMMUNITY): Payer: Self-pay

## 2021-05-29 ENCOUNTER — Telehealth: Payer: Self-pay | Admitting: Pharmacist

## 2021-05-29 DIAGNOSIS — R110 Nausea: Secondary | ICD-10-CM

## 2021-05-29 DIAGNOSIS — D72829 Elevated white blood cell count, unspecified: Secondary | ICD-10-CM | POA: Insufficient documentation

## 2021-05-29 DIAGNOSIS — R1110 Vomiting, unspecified: Secondary | ICD-10-CM

## 2021-05-29 DIAGNOSIS — R112 Nausea with vomiting, unspecified: Secondary | ICD-10-CM | POA: Diagnosis not present

## 2021-05-29 LAB — URINALYSIS, ROUTINE W REFLEX MICROSCOPIC
Bilirubin Urine: NEGATIVE
Glucose, UA: NEGATIVE mg/dL
Hgb urine dipstick: NEGATIVE
Ketones, ur: 20 mg/dL — AB
Nitrite: NEGATIVE
Protein, ur: 100 mg/dL — AB
Specific Gravity, Urine: 1.029 (ref 1.005–1.030)
pH: 5 (ref 5.0–8.0)

## 2021-05-29 LAB — COMPREHENSIVE METABOLIC PANEL
ALT: 33 U/L (ref 0–44)
AST: 35 U/L (ref 15–41)
Albumin: 3.2 g/dL — ABNORMAL LOW (ref 3.5–5.0)
Alkaline Phosphatase: 111 U/L (ref 38–126)
Anion gap: 12 (ref 5–15)
BUN: 18 mg/dL (ref 6–20)
CO2: 23 mmol/L (ref 22–32)
Calcium: 9.2 mg/dL (ref 8.9–10.3)
Chloride: 100 mmol/L (ref 98–111)
Creatinine, Ser: 0.93 mg/dL (ref 0.61–1.24)
GFR, Estimated: >60 mL/min (ref >60)
Glucose, Bld: 137 mg/dL — ABNORMAL HIGH (ref 70–99)
Potassium: 3.5 mmol/L (ref 3.5–5.1)
Sodium: 135 mmol/L (ref 135–145)
Total Bilirubin: 0.9 mg/dL (ref 0.3–1.2)
Total Protein: 8.5 g/dL — ABNORMAL HIGH (ref 6.5–8.1)

## 2021-05-29 LAB — CBC
HCT: 47.8 % (ref 39.0–52.0)
Hemoglobin: 15.3 g/dL (ref 13.0–17.0)
MCH: 29 pg (ref 26.0–34.0)
MCHC: 32 g/dL (ref 30.0–36.0)
MCV: 90.5 fL (ref 80.0–100.0)
Platelets: 251 10*3/uL (ref 150–400)
RBC: 5.28 MIL/uL (ref 4.22–5.81)
RDW: 14 % (ref 11.5–15.5)
WBC: 16.1 10*3/uL — ABNORMAL HIGH (ref 4.0–10.5)
nRBC: 0 % (ref 0.0–0.2)

## 2021-05-29 LAB — LIPASE, BLOOD: Lipase: 38 U/L (ref 11–51)

## 2021-05-29 MED ORDER — ONDANSETRON HCL 4 MG/2ML IJ SOLN
4.0000 mg | Freq: Once | INTRAMUSCULAR | Status: AC
  Administered 2021-05-29: 4 mg via INTRAVENOUS
  Filled 2021-05-29: qty 2

## 2021-05-29 MED ORDER — SODIUM CHLORIDE 0.9 % IV BOLUS
1000.0000 mL | Freq: Once | INTRAVENOUS | Status: AC
  Administered 2021-05-29: 1000 mL via INTRAVENOUS

## 2021-05-29 NOTE — Telephone Encounter (Signed)
Thank you ? ?Poor guy

## 2021-05-29 NOTE — Telephone Encounter (Signed)
Patient wife returned my call. They are headed to ED now.  ? ? ?Mattis Featherly P Kimoni Pagliarulo, CMA ? ?

## 2021-05-29 NOTE — ED Triage Notes (Signed)
Pt to triage for abnormal labs.  Per family pt potassium is 6.6.  reports weakness and vomiting since Monday.  Resp even and unlabored.  Skin warm and dry.  Reports back pain.  ?

## 2021-05-29 NOTE — ED Provider Notes (Signed)
?[facility] EMERGENCY DEPARTMENT ?Provider Note ? ? ?CSN: [id number] ?Arrival date & time: 05/29/21  1241 ? ?  ? ?History ? ?Chief Complaint  ?Patient presents with  ? Abnormal Lab  ? ? ?Michael [name] is a 61 y.o. male. ? ?Pt was sent to the ED for evaluated potassium.  Pt had blood work done yesterday.  Pt is currently being treated for a discitis in his back with home antibiotics.  Pt is being seen by Infectious disease.  Patient's family reports patient has not been eating well.  They report he has been drinking plenty of fluids.  Patient reports he does not have much of an appetite.  He states he does not want to eat solid foods.  Family states patient has been drinking Gatorade to prevent dehydration. ? ?The history is provided by the patient. No language interpreter was used.  ?Abnormal Lab ?Time since result:  Yesterday ?Patient referred by:  PCP ?Resulting agency:  External ?Result type: chemistry   ?Chemistry:  ?  Potassium:  High ? ?  ? ?Home Medications ?Prior to Admission medications   ?Medication Sig Start Date End Date Taking? Authorizing Provider  ?ceFAZolin (ANCEF) IVPB Inject 2 g into the vein every 8 (eight) hours. Indication:  E coli bacteremia/discitis ?First Dose: Yes ?Last Day of Therapy:  06/27/21 ?Labs - Once weekly:  CBC/D and BMP, ?Labs - Every other week:  ESR and CRP ?Method of administration: IV Push ?Method of administration may be changed at the discretion of home infusion pharmacist based upon assessment of the patient and/or caregiver's ability to self-administer the medication ordered. 05/21/21 06/28/21 Yes Dahal, Binaya, MD  ?ondansetron (ZOFRAN) 8 MG tablet Take 1 tablet (8 mg total) by mouth every 8 (eight) hours as needed for nausea or vomiting. 05/23/21  Yes Vu, Trung T, MD  ?promethazine (PHENERGAN) 25 MG tablet Take 1 tablet (25 mg total) by mouth every 6 (six) hours as needed for nausea or vomiting. 05/28/21  Yes Vu, Trung T, MD  ?psyllium (REGULOID) 0.52 g capsule Take 0.52 g  by mouth daily.   Yes [provider]  ?triamterene-hydrochlorothiazide (MAXZIDE-25) 37.5-25 MG tablet Take 1 tablet by mouth daily. 04/24/21  Yes [provider]  ?Aspirin-Salicylamide-Caffeine (BC HEADACHE POWDER PO) Take by mouth. ?Patient not taking: Reported on 05/28/2021    [provider]  ?calcium carbonate (TUMS - DOSED IN MG ELEMENTAL CALCIUM) 500 MG chewable tablet Chew 1 tablet by mouth daily. ?Patient not taking: Reported on 05/28/2021    [provider]  ?oxyCODONE-acetaminophen (PERCOCET/ROXICET) 5-325 MG tablet Take 2 tablets by mouth 2 (two) times daily. 05/29/21   [provider]  ?   ? ?Allergies    ?Patient has no known allergies.   ? ?Review of Systems   ?Review of Systems  ?All other systems reviewed and are negative. ? ?Physical Exam ?Updated Vital Signs ?BP (!) 133/93   Pulse 94   Temp 98 ?F (36.7 ?C) (Oral)   Resp 20   Ht 6' (1.829 m)   Wt (!) 149.2 kg   SpO2 91%   BMI 44.62 kg/m?  ?Physical Exam ?Vitals and nursing note reviewed.  ?Constitutional:   ?   Appearance: He is well-developed.  ?HENT:  ?   Head: Normocephalic.  ?   Right Ear: External ear normal.  ?   Left Ear: External ear normal.  ?   Nose: Nose normal.  ?Eyes:  ?   Pupils: Pupils are equal, round, and reactive   to light.  ?Cardiovascular:  ?   Rate and Rhythm: Normal rate.  ?Pulmonary:  ?   Effort: Pulmonary effort is normal.  ?Abdominal:  ?   General: There is no distension.  ?Musculoskeletal:     ?   General: Normal range of motion.  ?   Cervical back: Normal range of motion.  ?Neurological:  ?   General: No focal deficit present.  ?   Mental Status: He is alert and oriented to person, place, and time.  ?Psychiatric:     ?   Mood and Affect: Mood normal.  ? ? ?ED Results / Procedures / Treatments   ?Labs ?(all labs ordered are listed, but only abnormal results are displayed) ?Labs Reviewed  ?COMPREHENSIVE METABOLIC PANEL - Abnormal; Notable for the following components:  ?     Result Value  ? Glucose, Bld 137 (*)   ? Total Protein 8.5 (*)   ? Albumin 3.2 (*)   ? All other components within normal limits  ?CBC - Abnormal; Notable for the following components:  ? WBC 16.1 (*)   ? All other components within normal limits  ?LIPASE, BLOOD  ?URINALYSIS, ROUTINE W REFLEX MICROSCOPIC  ? ? ?EKG ?None ? ?Radiology ?No results found. ? ?Procedures ?Procedures  ? ? ?Medications Ordered in ED ?Medications - No data to display ? ?ED Course/ Medical Decision Making/ A&P ?  ?                        ?Medical Decision Making ?Patient was sent to the emergency department for evaluation of potassium being 6.6.  Patient has had vomiting.  ? ?Amount and/or Complexity of Data Reviewed ?Independent Historian: parent and spouse ?External Data Reviewed: labs. ?   Details: Labs from yesterday's outpatient visit were reviewed and potassium was noted to be elevated to 6.6 ?Labs: ordered. Decision-making details documented in ED Course. ?   Details: Labs ordered reviewed and interpreted patient's potassium is 3.4 here.  Patient has a elevated white blood cell count of 16.1 this has improved since patient's recent hospitalization. ? ?Risk ?Prescription drug management. ?Risk Details: Patient's family request that he have IV fluids while he is here in the emergency department and is given IV normal saline x1 L patient is given Zofran IV.  Patient is tolerating oral fluids well  he is encouraged to eat solids.  I counseled family on potassium elevations from hemolysis.  Patient has normal vital signs and is stable to return home.  He is advised to continue follow-up with infectious disease and his primary care physician. ? ? ? ? ? ? ? ? ? ? ?Final Clinical Impression(s) / ED Diagnoses ?Final diagnoses:  ?Nausea  ?Vomiting, unspecified vomiting type, unspecified whether nausea present  ?Labor abnormality  ? ? ?Rx / DC Orders ?ED Discharge Orders   ? ? None  ? ?  ? ?An After Visit Summary was printed and given to the  patient. ? ?  ?Fransico Meadow, [place] ?05/29/21 1740 ? ?  ?Milton Ferguson, MD ?05/31/21 1221 ? ?

## 2021-05-29 NOTE — Telephone Encounter (Signed)
Spoke with Michael [name] at [facility] - patient Potassium is 6.6. Per Dr.Vu patient needs to go to ED stat to be evaluated. Informed patient wife Michael [name] of Dr.Vu recommendation to go to ED due to high K+ level - she stated they are currently at patient PCP office. I advised her to inform PCP as well and she stated she would and give me a call back.   ? ? ? ?Michael [name] P Michael [name], CMA  ?

## 2021-05-29 NOTE — Telephone Encounter (Signed)
Patient's 3/28 labs still pending; [facility] faxed over K 6.6 result this morning. Called and LVM with Larita Fife requesting repeat Bmet today. ? ?Margarite Gouge, PharmD, CPP ?Clinical Pharmacist Practitioner ?Infectious Diseases Clinical Pharmacist ?[facility] ? ?

## 2021-05-29 NOTE — Discharge Instructions (Addendum)
Follow up with your Physician as scheduled,  Continue current medications  ?

## 2021-05-30 NOTE — Telephone Encounter (Signed)
Patient wife updated of change in IV antibiotics and voiced her understanding. ? ? ?Auron Tadros P Willamina Grieshop, CMA ? ?

## 2021-05-30 NOTE — Telephone Encounter (Signed)
Relayed verbal orders to [place] with [facility] to discontinue Ancef and start Rocephin 2g once daily with same end date of 06/27/21. Updated OPAT spreadsheet. Thank you!

## 2021-05-30 NOTE — Telephone Encounter (Signed)
Hi team, could you help me arrange for ceftriaxone 2 gm a day opat change from cefazolin ? ?Thanks

## 2021-05-30 NOTE — Telephone Encounter (Signed)
Patient wife Michael [name] called stating that she thinks the nausea and vomiting is due to cefazolin. Patient is unable to drink any fluids without vomiting. [name] thinks the antibiotic needs to be changed. Please advise. Also Patient went to ED yesterday potassium was normal at 3.5. ? ? ?[name], CMA ? ?

## 2021-06-04 ENCOUNTER — Ambulatory Visit (INDEPENDENT_AMBULATORY_CARE_PROVIDER_SITE_OTHER): Payer: BC Managed Care – PPO | Admitting: Internal Medicine

## 2021-06-04 ENCOUNTER — Other Ambulatory Visit: Payer: Self-pay

## 2021-06-04 ENCOUNTER — Ambulatory Visit (HOSPITAL_COMMUNITY)
Admission: RE | Admit: 2021-06-04 | Discharge: 2021-06-04 | Disposition: A | Discharge status: Home / Self Care | Payer: BC Managed Care – PPO | Source: Ambulatory Visit | Attending: Internal Medicine | Admitting: Internal Medicine

## 2021-06-04 ENCOUNTER — Ambulatory Visit (HOSPITAL_COMMUNITY)
Admit: 2021-06-04 | Discharge: 2021-06-04 | Disposition: A | Discharge status: Home / Self Care | Payer: BC Managed Care – PPO | Source: Ambulatory Visit | Attending: Internal Medicine | Admitting: Internal Medicine

## 2021-06-04 VITALS — BP 127/88 | HR 112 | Temp 97.8°F

## 2021-06-04 DIAGNOSIS — M4640 Discitis, unspecified, site unspecified: Secondary | ICD-10-CM

## 2021-06-04 DIAGNOSIS — R7881 Bacteremia: Secondary | ICD-10-CM | POA: Diagnosis not present

## 2021-06-04 DIAGNOSIS — M4620 Osteomyelitis of vertebra, site unspecified: Secondary | ICD-10-CM | POA: Diagnosis not present

## 2021-06-04 DIAGNOSIS — R112 Nausea with vomiting, unspecified: Secondary | ICD-10-CM

## 2021-06-04 MED ORDER — PROMETHAZINE HCL 25 MG RE SUPP: 25.0000 mg | Freq: Four times a day (QID) | RECTAL | 0 refills | Status: DC | PRN

## 2021-06-04 NOTE — Progress Notes (Addendum)
?  ? ? ? ? ?[facility] for Infect[facility] ? ?Patient Active Problem List  ? Diagnosis Date Noted  ? Gram-negative bacteremia 05/17/2021  ? Osteomyelitis ([place]) 05/16/2021  ? Essential hypertension 05/16/2021  ? Obesity, Class III, BMI 40-49.9 (morbid obesity) ([place]) 05/16/2021  ? ? ? ? ?Subjective:  ? ? Patient ID: Michael [name], male    DOB: 08-08-1960, 61 y.o.   MRN: [id number] ? ?Chief Complaint  ?Patient presents with  ? Follow-up  ?  Patient is unchanged - still having nausea and vomiting.    ? ? ?HPI: ? ?Michael [name] is a 61 y.o. male here for hospital f/u ecoli bacteremia/t8-9 osteo ? ?He is here earlier than advised due to severe nausea/vomiting ?Hasn't been able to keep anything down, feels tired ?Zofran not helping ?Reports while in hospital iv phenergan help ? ?Didn't want to try suppository phenergan ? ?Back pain moderate-severe not helped much with hydrocodone ?No f/c ?Picc site no issue ? ?----------- ?06/04/21 id f/u ?Due to severe nausea we had agreed to switch to ceftriaxone on 4/03 ?He still have severe n/v. ?His last bun/cr on 3/30 was 18/0.93 ?No f/c ?He feels weak/tired ?No chest pain/sob ?Intermittent cough ?No arms pain/numbness ? ?He was seen in ed 3/30 for spurious lab of k in the 6's but repeat testing there was 3.2. he was given 1 liter normal saline and iv zofran and discharged ? ?No headache ?Passing flatus and bowel movement -- not diarrhea ? ? ? ?He is currently only taking phenergan ? ? ?No Known Allergies ? ? ? ?Outpatient Medications Prior to Visit  ?Medication Sig Dispense Refill  ? oxyCODONE-acetaminophen (PERCOCET/ROXICET) 5-325 MG tablet Take 2 tablets by mouth 2 (two) times daily.    ? promethazine (PHENERGAN) 25 MG tablet Take 1 tablet (25 mg total) by mouth every 6 (six) hours as needed for nausea or vomiting. 30 tablet 0  ? triamterene-hydrochlorothiazide (MAXZIDE-25) 37.5-25 MG tablet Take 1 tablet by mouth daily.    ? Aspirin-Salicylamide-Caffeine (BC HEADACHE  POWDER PO) Take by mouth. (Patient not taking: Reported on 05/28/2021)    ? calcium carbonate (TUMS - DOSED IN MG ELEMENTAL CALCIUM) 500 MG chewable tablet Chew 1 tablet by mouth daily. (Patient not taking: Reported on 05/28/2021)    ? ceFAZolin (ANCEF) IVPB Inject 2 g into the vein every 8 (eight) hours. Indication:  E coli bacteremia/discitis ?First Dose: Yes ?Last Day of Therapy:  06/27/21 ?Labs - Once weekly:  CBC/D and BMP, ?Labs - Every other week:  ESR and CRP ?Method of administration: IV Push ?Method of administration may be changed at the discretion of home infusion pharmacist based upon assessment of the patient and/or caregiver's ability to self-administer the medication ordered. (Patient not taking: Reported on 06/04/2021) 114 Units 0  ? ondansetron (ZOFRAN) 8 MG tablet Take 1 tablet (8 mg total) by mouth every 8 (eight) hours as needed for nausea or vomiting. (Patient not taking: Reported on 06/04/2021) 20 tablet 0  ? psyllium (REGULOID) 0.52 g capsule Take 0.52 g by mouth daily. (Patient not taking: Reported on 06/04/2021)    ? ?No facility-administered medications prior to visit.  ? ? ? ?Social History  ? ?Socioeconomic History  ? Marital status: Married  ?  Spouse name: Not on file  ? Number of children: Not on file  ? Years of education: Not on file  ? Highest education level: Not on file  ?Occupational History  ? Not on file  ?Tobacco Use  ?  Smoking status: Never  ? Smokeless tobacco: Never  ?Vaping Use  ? Vaping Use: Never used  ?Substance and Sexual Activity  ? Alcohol use: Not Currently  ? Drug use: Not Currently  ? Sexual activity: Not Currently  ?Other Topics Concern  ? Not on file  ?Social History Narrative  ? Not on file  ? ?Social Determinants of Health  ? ?Financial Resource Strain: Not on file  ?Food Insecurity: Not on file  ?Transportation Needs: Not on file  ?Physical Activity: Not on file  ?Stress: Not on file  ?Social Connections: Not on file  ?Intimate Partner Violence: Not on file   ? ? ? ? ?Review of Systems ?   ?All other ros negative ? ?Objective:  ?  ?BP 127/88   Pulse (!) 112   Temp 97.8 ?F (36.6 ?C) (Temporal)   SpO2 97%  ?Nursing note and vital signs reviewed. ? ?Physical Exam ? ?   ?General/constitutional: no obvious distress; conversant but not talkative ?HEENT: Normocephalic, PER, Conj Clear, EOMI, Oropharynx clear ?Neck supple ?CV: rrr no mrg ?Lungs: clear to auscultation, normal respiratory effort ?Abd: Soft, Nontender, nondistended but obese ?Ext: no edema ?Skin: No Rash ?MSK: no peripheral joint swelling/tenderness/warmth; back spines nontender ?Neuro: generalized weakness ? ?Picc site no erythema/tenderness ? ?Labs: ?Lab Results  ?Component Value Date  ? WBC 16.1 (H) 05/29/2021  ? HGB 15.3 05/29/2021  ? HCT 47.8 05/29/2021  ? MCV 90.5 05/29/2021  ? PLT 251 05/29/2021  ? ?Last metabolic panel ?Lab Results  ?Component Value Date  ? GLUCOSE 137 (H) 05/29/2021  ? NA 135 05/29/2021  ? K 3.5 05/29/2021  ? CL 100 05/29/2021  ? CO2 23 05/29/2021  ? BUN 18 05/29/2021  ? CREATININE 0.93 05/29/2021  ? GFRNONAA >60 05/29/2021  ? CALCIUM 9.2 05/29/2021  ? PROT 8.5 (H) 05/29/2021  ? ALBUMIN 3.2 (L) 05/29/2021  ? BILITOT 0.9 05/29/2021  ? ALKPHOS 111 05/29/2021  ? AST 35 05/29/2021  ? ALT 33 05/29/2021  ? ANIONGAP 12 05/29/2021  ? ?Crp: ?3/26    82 ? ?Micro: ? ?Serology: ? ?Imaging: ? ?Assessment & Plan:  ? ?Problem List Items Addressed This Visit   ?None ?Visit Diagnoses   ? ? Discitis, unspecified spinal region    -  Primary  ? Relevant Orders  ? C-reactive protein  ? COMPLETE METABOLIC PANEL WITH GFR  ? Lipase  ? Troponin I  ? CBC w/Diff  ? Vertebral osteomyelitis ([place])      ? Relevant Orders  ? C-reactive protein  ? COMPLETE METABOLIC PANEL WITH GFR  ? Lipase  ? Troponin I  ? CBC w/Diff  ? Nausea and vomiting, unspecified vomiting type      ? Relevant Orders  ? C-reactive protein  ? COMPLETE METABOLIC PANEL WITH GFR  ? Lipase  ? Troponin I  ? CBC w/Diff  ? ?  ? ? ? ?No orders of the  defined types were placed in this encounter. ? ?Patient with penicillin sensitive ecoli bacteremia and t8-9 bacteremia, has been on cefazolin --> ceftriaxone for tx ?Ceftriaxone was switched on 4/01 due to nausea vomiting ? ?He continues to have similar moderate-severe n/v ?I do not appreciate clinical suggestion of MI or stroke or bowel obstruction  ? ?I suspect effect is still likely from antibiotics ?We had given him phenergan/zofran but still symptomatic ?He has gall bladder stones but on 3/25 u/s no sign of cholecystitis (by clinical s/s and history either) ? ?Tentative end date is  4/28 ? ?-we can start with a kub ?-will check troponin as well ?-I advise him to discuss with his pcp as well ?-labs today chemistry/crp/cbc-diff ?-f/u 4/18 ?-trial of suppository ? ? ?Follow-up: Return in about 13 days (around 06/17/2021). ? ? ?------------- ?Addendum ?Crp markedly elevated along with leukocytosis despite being on abx for his t8-9 osteomyelitis ? ?Will get abdominal-pelv ct with contrast to r/o intraabdominal occult abscess given recent ecoli bacteremia  ? ? ?Jabier Mutton, MD ?[facility] for Infectious Disease ?[place] ?[phone]  pager   [phone] cell ?06/04/2021, 4:28 PM ? ?

## 2021-06-04 NOTE — Patient Instructions (Addendum)
Will check abdominal xray, labs to include cardiac cause, pancreatitis, hepatitis, and obstruction for your nausea/vomiting ? ? ?Continue phenergan as needed (use suppository to get ahead of nauesa, then can use oral pill once not as nauseated/vomiting). Consider using suppository as well if you can't keep it down ?Continue ensure and isotonic electrolytes drink to keep up with nutrition/fluid ? ?Please discuss this with your pcp to see if he has other idea that might be able to help ? ?See me in around 10 days on 4/18 ? ? ?

## 2021-06-05 ENCOUNTER — Ambulatory Visit: Payer: BC Managed Care – PPO | Admitting: Internal Medicine

## 2021-06-05 LAB — COMPLETE METABOLIC PANEL WITH GFR
AG Ratio: 0.8 (calc) — ABNORMAL LOW (ref 1.0–2.5)
ALT: 69 U/L — ABNORMAL HIGH (ref 9–46)
AST: 39 U/L — ABNORMAL HIGH (ref 10–35)
Albumin: 3.3 g/dL — ABNORMAL LOW (ref 3.6–5.1)
Alkaline phosphatase (APISO): 116 U/L (ref 35–144)
BUN: 24 mg/dL (ref 7–25)
CO2: 26 mmol/L (ref 20–32)
Calcium: 9.7 mg/dL (ref 8.6–10.3)
Chloride: 99 mmol/L (ref 98–110)
Creat: 1.04 mg/dL (ref 0.70–1.30)
Globulin: 4.4 g/dL (calc) — ABNORMAL HIGH (ref 1.9–3.7)
Glucose, Bld: 148 mg/dL — ABNORMAL HIGH (ref 65–99)
Potassium: 3.5 mmol/L (ref 3.5–5.3)
Sodium: 139 mmol/L (ref 135–146)
Total Bilirubin: 1.1 mg/dL (ref 0.2–1.2)
Total Protein: 7.7 g/dL (ref 6.1–8.1)
eGFR: 83 mL/min/{1.73_m2} (ref >=60)

## 2021-06-05 LAB — CBC WITH DIFFERENTIAL/PLATELET
Absolute Monocytes: 1313 cells/uL — ABNORMAL HIGH (ref 200–950)
Basophils Absolute: 98 cells/uL (ref 0–200)
Basophils Relative: 0.5 %
Eosinophils Absolute: 39 cells/uL (ref 15–500)
Eosinophils Relative: 0.2 %
HCT: 50.9 % — ABNORMAL HIGH (ref 38.5–50.0)
Hemoglobin: 17.2 g/dL — ABNORMAL HIGH (ref 13.2–17.1)
Lymphs Abs: 1568 cells/uL (ref 850–3900)
MCH: 29.6 pg (ref 27.0–33.0)
MCHC: 33.8 g/dL (ref 32.0–36.0)
MCV: 87.5 fL (ref 80.0–100.0)
MPV: 12.7 fL — ABNORMAL HIGH (ref 7.5–12.5)
Monocytes Relative: 6.7 %
Neutro Abs: 16582 cells/uL — ABNORMAL HIGH (ref 1500–7800)
Neutrophils Relative %: 84.6 %
Platelets: 194 10*3/uL (ref 140–400)
RBC: 5.82 10*6/uL — ABNORMAL HIGH (ref 4.20–5.80)
RDW: 13.5 % (ref 11.0–15.0)
Total Lymphocyte: 8 %
WBC: 19.6 10*3/uL — ABNORMAL HIGH (ref 3.8–10.8)

## 2021-06-05 LAB — C-REACTIVE PROTEIN: CRP: 110.2 mg/L — ABNORMAL HIGH (ref <8.0)

## 2021-06-05 LAB — TROPONIN I: Troponin I: 5 ng/L (ref <=47)

## 2021-06-05 LAB — LIPASE: Lipase: 21 U/L (ref 7–60)

## 2021-06-05 NOTE — Addendum Note (Signed)
Addended byRutha Bouchard T on: 06/05/2021 05:30 PM ? ? Modules accepted: Orders ? ?

## 2021-06-09 ENCOUNTER — Telehealth: Payer: Self-pay

## 2021-06-09 ENCOUNTER — Other Ambulatory Visit: Payer: Self-pay | Admitting: Pharmacist

## 2021-06-09 DIAGNOSIS — R112 Nausea with vomiting, unspecified: Secondary | ICD-10-CM

## 2021-06-09 MED ORDER — PROMETHAZINE HCL 6.25 MG/5ML PO SYRP: 25.0000 mg | ORAL_SOLUTION | Freq: Four times a day (QID) | ORAL | 0 refills | Status: DC | PRN

## 2021-06-09 NOTE — Telephone Encounter (Signed)
Notified patient's wife that liquid form phenergan has been sent to the pharmacy.  ? ?Beryle Flock, RN ? ?

## 2021-06-09 NOTE — Telephone Encounter (Signed)
-----   Message from Raymondo Band, MD sent at 06/05/2021  5:30 PM EDT ----- ?Hi team. The labs results are back with very high crp and wbc. Please let him know I have ordered a ct scan of his abdomen to make sure there is no abscess that I am missing.  ? ?Claris Che could you help schedule one for him to be done within 1 week. thanks ?

## 2021-06-09 NOTE — Telephone Encounter (Signed)
Patient and his wife called stating that he is having a hard time swallowing the phenergan and Percocet and that wife has been having to crush these medications to give them to him.  ? ?She is asking for liquid alternatives. Advised her to contact prescribing provider in regards to the Percocet. Relayed that Phenergan suppository was prescribed, she says patient will not use that.  ? ?Patient is asking to be admitted to the hospital the day before his CT scan because he has a hard time getting up and down and would like the scan to be performed while he is in a hospital bed. Relayed that this would not warrant a direct admission and recommended they call [facility] ahead of time to discuss patient's physical limitations. Also recommended reaching out to PCP to discuss possible need for pain medication prior to procedure.  ? ?Patient also expresses concern over the fact that he feels like he will not be able to drink all of the contrast. Patient's wife says he is barely eating or drinking.  ? ?Sandie Ano, RN ? ?

## 2021-06-09 NOTE — Telephone Encounter (Signed)
Patient wife aware of lab results and our referral coordinator will be in contact setting up CT of abdomen.  ? ? ?Michael [name], CMA ? ?

## 2021-06-09 NOTE — Telephone Encounter (Signed)
I believe phenergan tablets are film coated so it would be best to switch to liquid alternative. I sent in some promethazine syrup he can try to the same pharmacy his previous promethazine tabs was sent to. Let me know if there is anything else I can help with - thanks!

## 2021-06-10 ENCOUNTER — Telehealth: Payer: Self-pay

## 2021-06-10 NOTE — Telephone Encounter (Signed)
Patient's wife informed and states she feels his is having PTSD about vomiting due to have pain in his back when vomits. She states she will have him try it.  ?Ilianna Bown T Meagon Duskin ? ?

## 2021-06-10 NOTE — Telephone Encounter (Signed)
Patient's wife called stating that patient is unable to drink the contrast for his CT scan tomorrow. Patient wife is asking what patient should do if he can not drink the contrast. Please advise ?Michael [name] ? ?

## 2021-06-11 ENCOUNTER — Encounter (HOSPITAL_COMMUNITY): Payer: Self-pay

## 2021-06-11 ENCOUNTER — Other Ambulatory Visit: Payer: Self-pay

## 2021-06-11 ENCOUNTER — Inpatient Hospital Stay (HOSPITAL_COMMUNITY)
Admission: EM | Admit: 2021-06-11 | Discharge: 2021-07-02 | DRG: 478 | Disposition: A | Discharge status: Skilled Nursing Facility | Payer: BC Managed Care – PPO | Attending: Internal Medicine | Admitting: Internal Medicine

## 2021-06-11 DIAGNOSIS — B370 Candidal stomatitis: Secondary | ICD-10-CM | POA: Diagnosis present

## 2021-06-11 DIAGNOSIS — E876 Hypokalemia: Secondary | ICD-10-CM | POA: Diagnosis not present

## 2021-06-11 DIAGNOSIS — Z6835 Body mass index (BMI) 35.0-35.9, adult: Secondary | ICD-10-CM

## 2021-06-11 DIAGNOSIS — E8809 Other disorders of plasma-protein metabolism, not elsewhere classified: Secondary | ICD-10-CM | POA: Diagnosis not present

## 2021-06-11 DIAGNOSIS — R1110 Vomiting, unspecified: Secondary | ICD-10-CM | POA: Diagnosis not present

## 2021-06-11 DIAGNOSIS — K029 Dental caries, unspecified: Secondary | ICD-10-CM | POA: Diagnosis present

## 2021-06-11 DIAGNOSIS — R634 Abnormal weight loss: Secondary | ICD-10-CM | POA: Diagnosis present

## 2021-06-11 DIAGNOSIS — E871 Hypo-osmolality and hyponatremia: Secondary | ICD-10-CM | POA: Diagnosis present

## 2021-06-11 DIAGNOSIS — K649 Unspecified hemorrhoids: Secondary | ICD-10-CM | POA: Diagnosis present

## 2021-06-11 DIAGNOSIS — M869 Osteomyelitis, unspecified: Principal | ICD-10-CM

## 2021-06-11 DIAGNOSIS — Z7982 Long term (current) use of aspirin: Secondary | ICD-10-CM

## 2021-06-11 DIAGNOSIS — K8020 Calculus of gallbladder without cholecystitis without obstruction: Secondary | ICD-10-CM | POA: Diagnosis present

## 2021-06-11 DIAGNOSIS — M4624 Osteomyelitis of vertebra, thoracic region: Secondary | ICD-10-CM | POA: Diagnosis present

## 2021-06-11 DIAGNOSIS — R066 Hiccough: Secondary | ICD-10-CM | POA: Diagnosis present

## 2021-06-11 DIAGNOSIS — M4644 Discitis, unspecified, thoracic region: Principal | ICD-10-CM | POA: Diagnosis present

## 2021-06-11 DIAGNOSIS — E6609 Other obesity due to excess calories: Secondary | ICD-10-CM

## 2021-06-11 DIAGNOSIS — B9620 Unspecified Escherichia coli [E. coli] as the cause of diseases classified elsewhere: Secondary | ICD-10-CM | POA: Diagnosis present

## 2021-06-11 DIAGNOSIS — K625 Hemorrhage of anus and rectum: Secondary | ICD-10-CM | POA: Diagnosis not present

## 2021-06-11 DIAGNOSIS — I959 Hypotension, unspecified: Secondary | ICD-10-CM | POA: Diagnosis not present

## 2021-06-11 DIAGNOSIS — R601 Generalized edema: Secondary | ICD-10-CM | POA: Diagnosis present

## 2021-06-11 DIAGNOSIS — L8990 Pressure ulcer of unspecified site, unspecified stage: Secondary | ICD-10-CM | POA: Insufficient documentation

## 2021-06-11 DIAGNOSIS — E669 Obesity, unspecified: Secondary | ICD-10-CM | POA: Diagnosis present

## 2021-06-11 DIAGNOSIS — L89152 Pressure ulcer of sacral region, stage 2: Secondary | ICD-10-CM | POA: Diagnosis not present

## 2021-06-11 DIAGNOSIS — R112 Nausea with vomiting, unspecified: Secondary | ICD-10-CM

## 2021-06-11 DIAGNOSIS — R7881 Bacteremia: Secondary | ICD-10-CM | POA: Diagnosis present

## 2021-06-11 DIAGNOSIS — I10 Essential (primary) hypertension: Secondary | ICD-10-CM | POA: Diagnosis present

## 2021-06-11 DIAGNOSIS — H538 Other visual disturbances: Secondary | ICD-10-CM | POA: Diagnosis present

## 2021-06-11 DIAGNOSIS — K760 Fatty (change of) liver, not elsewhere classified: Secondary | ICD-10-CM | POA: Diagnosis present

## 2021-06-11 DIAGNOSIS — K5900 Constipation, unspecified: Secondary | ICD-10-CM

## 2021-06-11 DIAGNOSIS — K089 Disorder of teeth and supporting structures, unspecified: Secondary | ICD-10-CM

## 2021-06-11 DIAGNOSIS — Z79899 Other long term (current) drug therapy: Secondary | ICD-10-CM

## 2021-06-11 HISTORY — DX: Discitis, unspecified, site unspecified: M46.40

## 2021-06-11 HISTORY — DX: Obesity, class 1: E66.811

## 2021-06-11 HISTORY — DX: Other obesity due to excess calories: E66.09

## 2021-06-11 LAB — COMPREHENSIVE METABOLIC PANEL
ALT: 109 U/L — ABNORMAL HIGH (ref 0–44)
AST: 51 U/L — ABNORMAL HIGH (ref 15–41)
Albumin: 2.8 g/dL — ABNORMAL LOW (ref 3.5–5.0)
Alkaline Phosphatase: 108 U/L (ref 38–126)
Anion gap: 12 (ref 5–15)
BUN: 12 mg/dL (ref 6–20)
CO2: 23 mmol/L (ref 22–32)
Calcium: 9.3 mg/dL (ref 8.9–10.3)
Chloride: 98 mmol/L (ref 98–111)
Creatinine, Ser: 0.99 mg/dL (ref 0.61–1.24)
GFR, Estimated: >60 mL/min (ref >60)
Glucose, Bld: 178 mg/dL — ABNORMAL HIGH (ref 70–99)
Potassium: 3.2 mmol/L — ABNORMAL LOW (ref 3.5–5.1)
Sodium: 133 mmol/L — ABNORMAL LOW (ref 135–145)
Total Bilirubin: 0.8 mg/dL (ref 0.3–1.2)
Total Protein: 7.8 g/dL (ref 6.5–8.1)

## 2021-06-11 LAB — CBC WITH DIFFERENTIAL/PLATELET
Abs Immature Granulocytes: 0.27 10*3/uL — ABNORMAL HIGH (ref 0.00–0.07)
Basophils Absolute: 0.1 10*3/uL (ref 0.0–0.1)
Basophils Relative: 1 %
Eosinophils Absolute: 0.1 10*3/uL (ref 0.0–0.5)
Eosinophils Relative: 1 %
HCT: 49.4 % (ref 39.0–52.0)
Hemoglobin: 16.2 g/dL (ref 13.0–17.0)
Immature Granulocytes: 2 %
Lymphocytes Relative: 13 %
Lymphs Abs: 1.5 10*3/uL (ref 0.7–4.0)
MCH: 29.2 pg (ref 26.0–34.0)
MCHC: 32.8 g/dL (ref 30.0–36.0)
MCV: 89 fL (ref 80.0–100.0)
Monocytes Absolute: 1.2 10*3/uL — ABNORMAL HIGH (ref 0.1–1.0)
Monocytes Relative: 10 %
Neutro Abs: 9.1 10*3/uL — ABNORMAL HIGH (ref 1.7–7.7)
Neutrophils Relative %: 73 %
Platelets: 309 10*3/uL (ref 150–400)
RBC: 5.55 MIL/uL (ref 4.22–5.81)
RDW: 13.9 % (ref 11.5–15.5)
WBC: 12.3 10*3/uL — ABNORMAL HIGH (ref 4.0–10.5)
nRBC: 0 % (ref 0.0–0.2)

## 2021-06-11 LAB — PROTIME-INR
INR: 1.4 — ABNORMAL HIGH (ref 0.8–1.2)
Prothrombin Time: 16.6 seconds — ABNORMAL HIGH (ref 11.4–15.2)

## 2021-06-11 LAB — APTT: aPTT: 31 seconds (ref 24–36)

## 2021-06-11 LAB — C-REACTIVE PROTEIN: CRP: 10.2 mg/dL — ABNORMAL HIGH (ref <1.0)

## 2021-06-11 NOTE — ED Triage Notes (Signed)
Complaining of infected vertebra in the back, having pain, not improving with antibiotic treatment, is followed by the immunologist in [place] ?

## 2021-06-11 NOTE — ED Provider Triage Note (Signed)
?  Emergency Medicine Provider Triage Evaluation Note ? ?MRN:  [id number]  ?Arrival date & time: 06/11/21    ?Medically screening exam initiated at 10:24 PM.   ?CC:   ?Back Pain ?  ?HPI:  ?Michael [name] is a 61 y.o. year-old male presents to the ED with chief complaint of back pain and nausea and vomiting. 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  Getting Ancef through PICC.  Followed by ID, who have ordered a CT abd/pelv to look for intraabdominal abscess due to persistent vomiting and pain.  Patient reports that his back pain has worsened. ? ?History provided by  ?ROS:  ?-As included in HPI ?PE:  ? ?Vitals:  ? 06/11/21 2039 06/11/21 2218  ?BP: (!) 139/98   ?Pulse: (!) 120   ?Resp: 16   ?Temp: 97.9 ?F (36.6 ?C)   ?SpO2: 95% 96%  ?  ?Uncomfortable appearing ?No respiratory distress ? ?MDM:  ?Based on signs and symptoms, sepsis/worsening infection is highest on my differential. ?I've ordered labs in triage to expedite lab/diagnostic workup. ? ?Patient was informed that the remainder of the evaluation will be completed by another provider, this initial triage assessment does not replace that evaluation, and the importance of remaining in the ED until their evaluation is complete. ? ?  ?Roxy Horseman, PA-C ?06/11/21 2229 ? ?

## 2021-06-12 ENCOUNTER — Emergency Department (HOSPITAL_COMMUNITY): Payer: BC Managed Care – PPO

## 2021-06-12 ENCOUNTER — Encounter (HOSPITAL_COMMUNITY): Payer: Self-pay | Admitting: Internal Medicine

## 2021-06-12 ENCOUNTER — Inpatient Hospital Stay (HOSPITAL_COMMUNITY): Payer: BC Managed Care – PPO

## 2021-06-12 DIAGNOSIS — F4321 Adjustment disorder with depressed mood: Secondary | ICD-10-CM | POA: Diagnosis not present

## 2021-06-12 DIAGNOSIS — E6609 Other obesity due to excess calories: Secondary | ICD-10-CM | POA: Diagnosis not present

## 2021-06-12 DIAGNOSIS — R601 Generalized edema: Secondary | ICD-10-CM | POA: Diagnosis present

## 2021-06-12 DIAGNOSIS — K089 Disorder of teeth and supporting structures, unspecified: Secondary | ICD-10-CM

## 2021-06-12 DIAGNOSIS — M869 Osteomyelitis, unspecified: Secondary | ICD-10-CM | POA: Diagnosis not present

## 2021-06-12 DIAGNOSIS — K029 Dental caries, unspecified: Secondary | ICD-10-CM | POA: Diagnosis present

## 2021-06-12 DIAGNOSIS — G4700 Insomnia, unspecified: Secondary | ICD-10-CM | POA: Diagnosis not present

## 2021-06-12 DIAGNOSIS — E8809 Other disorders of plasma-protein metabolism, not elsewhere classified: Secondary | ICD-10-CM | POA: Diagnosis not present

## 2021-06-12 DIAGNOSIS — B370 Candidal stomatitis: Secondary | ICD-10-CM | POA: Diagnosis present

## 2021-06-12 DIAGNOSIS — K5900 Constipation, unspecified: Secondary | ICD-10-CM | POA: Diagnosis present

## 2021-06-12 DIAGNOSIS — M4644 Discitis, unspecified, thoracic region: Secondary | ICD-10-CM

## 2021-06-12 DIAGNOSIS — R112 Nausea with vomiting, unspecified: Secondary | ICD-10-CM | POA: Diagnosis not present

## 2021-06-12 DIAGNOSIS — R748 Abnormal levels of other serum enzymes: Secondary | ICD-10-CM | POA: Diagnosis not present

## 2021-06-12 DIAGNOSIS — H538 Other visual disturbances: Secondary | ICD-10-CM | POA: Diagnosis present

## 2021-06-12 DIAGNOSIS — K760 Fatty (change of) liver, not elsewhere classified: Secondary | ICD-10-CM | POA: Diagnosis present

## 2021-06-12 DIAGNOSIS — L89152 Pressure ulcer of sacral region, stage 2: Secondary | ICD-10-CM | POA: Diagnosis not present

## 2021-06-12 DIAGNOSIS — E669 Obesity, unspecified: Secondary | ICD-10-CM | POA: Diagnosis present

## 2021-06-12 DIAGNOSIS — K8020 Calculus of gallbladder without cholecystitis without obstruction: Secondary | ICD-10-CM | POA: Diagnosis present

## 2021-06-12 DIAGNOSIS — Z79899 Other long term (current) drug therapy: Secondary | ICD-10-CM | POA: Diagnosis not present

## 2021-06-12 DIAGNOSIS — I959 Hypotension, unspecified: Secondary | ICD-10-CM | POA: Diagnosis not present

## 2021-06-12 DIAGNOSIS — R7881 Bacteremia: Secondary | ICD-10-CM | POA: Diagnosis present

## 2021-06-12 DIAGNOSIS — R634 Abnormal weight loss: Secondary | ICD-10-CM | POA: Diagnosis present

## 2021-06-12 DIAGNOSIS — E871 Hypo-osmolality and hyponatremia: Secondary | ICD-10-CM | POA: Diagnosis present

## 2021-06-12 DIAGNOSIS — M4624 Osteomyelitis of vertebra, thoracic region: Secondary | ICD-10-CM | POA: Diagnosis present

## 2021-06-12 DIAGNOSIS — K649 Unspecified hemorrhoids: Secondary | ICD-10-CM | POA: Diagnosis present

## 2021-06-12 DIAGNOSIS — R066 Hiccough: Secondary | ICD-10-CM | POA: Diagnosis present

## 2021-06-12 DIAGNOSIS — I10 Essential (primary) hypertension: Secondary | ICD-10-CM | POA: Diagnosis present

## 2021-06-12 DIAGNOSIS — Z7982 Long term (current) use of aspirin: Secondary | ICD-10-CM | POA: Diagnosis not present

## 2021-06-12 DIAGNOSIS — B9620 Unspecified Escherichia coli [E. coli] as the cause of diseases classified elsewhere: Secondary | ICD-10-CM | POA: Diagnosis present

## 2021-06-12 DIAGNOSIS — K625 Hemorrhage of anus and rectum: Secondary | ICD-10-CM | POA: Diagnosis not present

## 2021-06-12 LAB — URINALYSIS, ROUTINE W REFLEX MICROSCOPIC
Bilirubin Urine: NEGATIVE
Glucose, UA: NEGATIVE mg/dL
Ketones, ur: NEGATIVE mg/dL
Leukocytes,Ua: NEGATIVE
Nitrite: NEGATIVE
Protein, ur: NEGATIVE mg/dL
Specific Gravity, Urine: 1.01 (ref 1.005–1.030)
pH: 6 (ref 5.0–8.0)

## 2021-06-12 LAB — RAPID URINE DRUG SCREEN, HOSP PERFORMED
Amphetamines: NOT DETECTED
Barbiturates: NOT DETECTED
Benzodiazepines: NOT DETECTED
Cocaine: NOT DETECTED
Opiates: POSITIVE — AB
Tetrahydrocannabinol: NOT DETECTED

## 2021-06-12 LAB — URINALYSIS, MICROSCOPIC (REFLEX)

## 2021-06-12 LAB — LACTIC ACID, PLASMA
Lactic Acid, Venous: 2.4 mmol/L (ref 0.5–1.9)
Lactic Acid, Venous: 1.9 mmol/L (ref 0.5–1.9)

## 2021-06-12 LAB — SEDIMENTATION RATE: Sed Rate: 40 mm/hr — ABNORMAL HIGH (ref 0–16)

## 2021-06-12 IMAGING — MR MR THORACIC SPINE W/ CM
8 of 12 series · 25 of 48 positions shown · IV contrast (agent unspecified)
Comparison: CT Abdomen and Pelvis [FJ] hours today. Thoracic spine
MRI [DATE].

CLINICAL DATA: 61-year-old male with increasing back pain. T8-T9
discitis osteomyelitis diagnosed in [REDACTED].

EXAM:
MRI THORACIC SPINE WITH CONTRAST
TECHNIQUE: Multiplanar, multisequence MR imaging of the thoracic spine was
performed following the administration of intravenous contrast.

[Series 14: T1 · sagittal · 6.0mm · 1.23mm/px · 1 of 9 slices shown (1 of 4)]
[im 1/9]
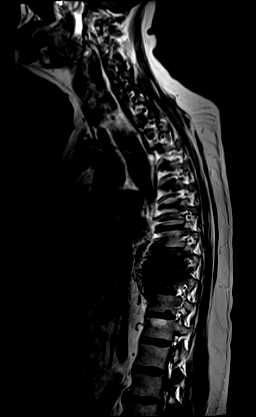

[Series 15: T2 · sagittal · 3.0mm · 0.93mm/px · 2 of 23 slices shown (1 of 2)]
[im 1/23]
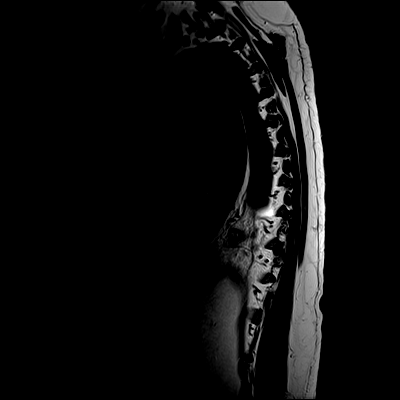
[im 23/23]
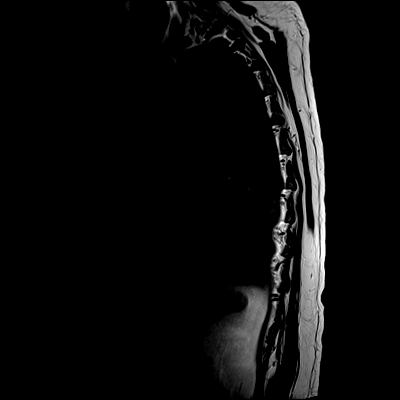

[Series 16: T1 · sagittal · 3.0mm · 0.93mm/px · 3 of 23 slices shown (2 of 4)]
[im 1/23]
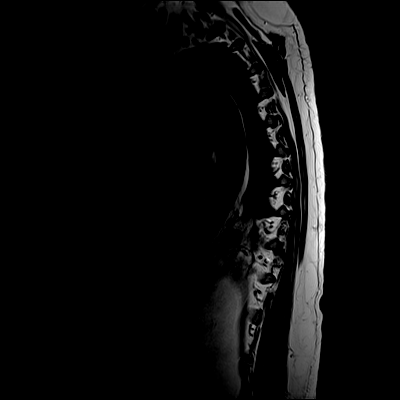
[im 12/23]
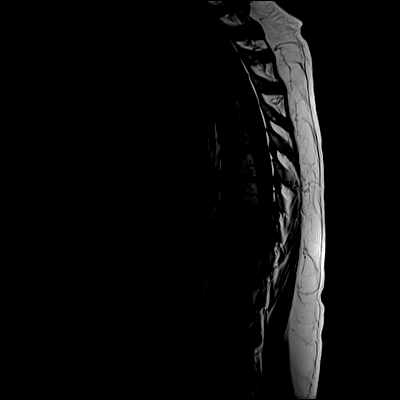
[im 23/23]
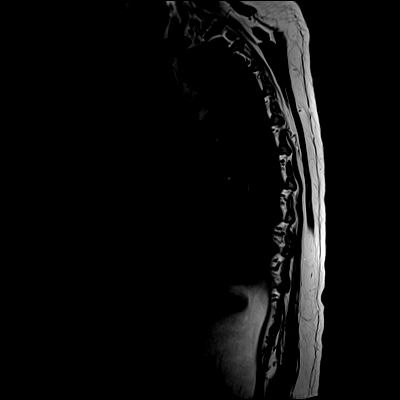

[Series 18: T2 · axial · 4.0mm · 0.59mm/px · z∈[-313,-42]mm · 5 of 39 slices shown (2 of 2)]
[im 1/39]
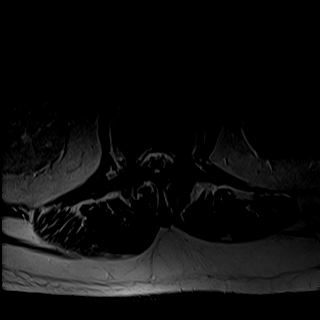
[im 10/39]
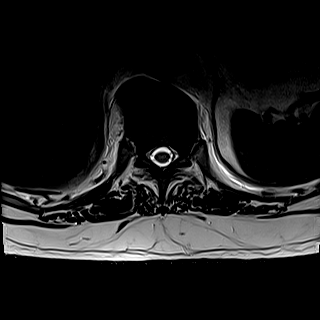
[im 20/39]
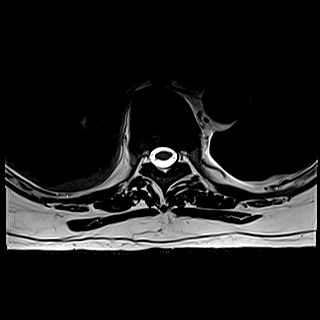
[im 29/39]
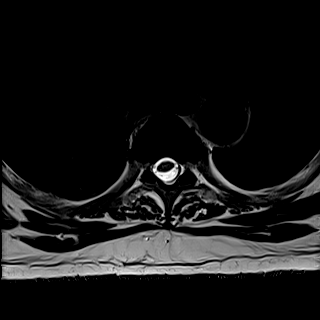
[im 39/39]
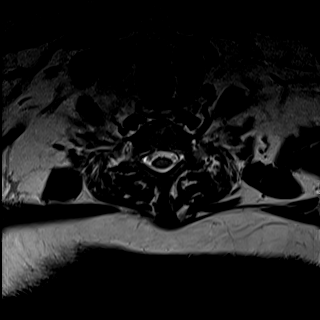

[Series 20: T1 · axial · non-contrast · 4.0mm · 0.33mm/px · z∈[-313,-42]mm · 5 of 39 slices shown (3 of 4)]
[im 1/39]
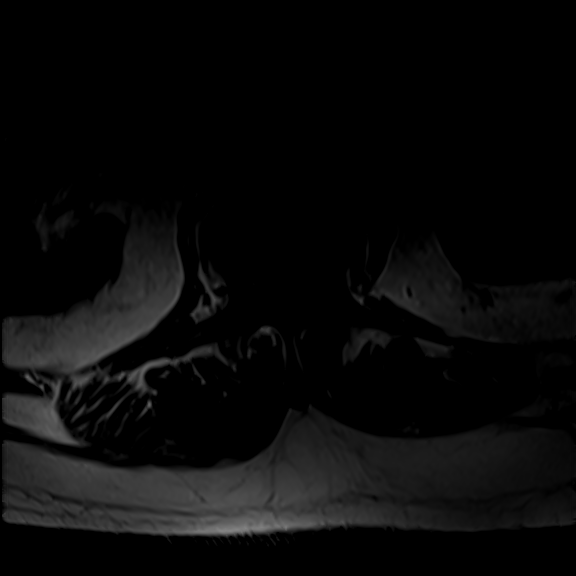
[im 10/39]
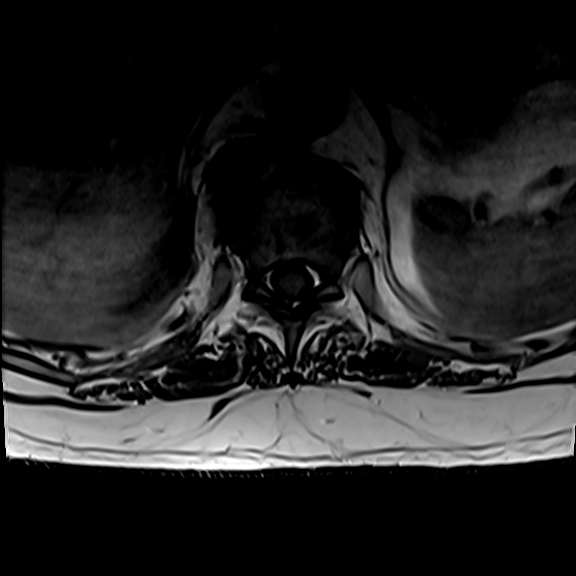
[im 20/39]
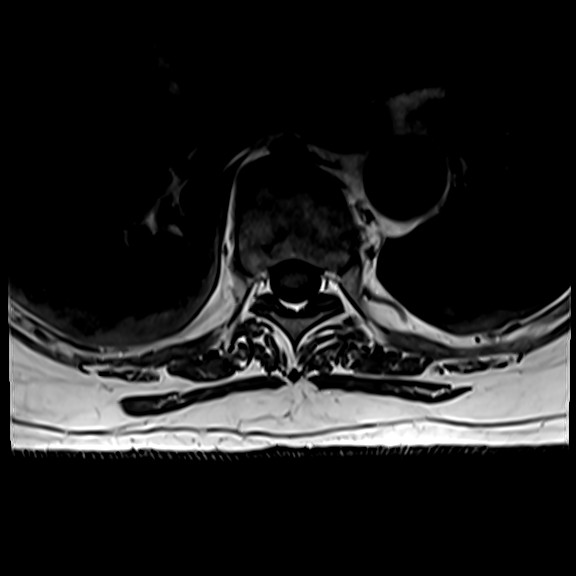
[im 29/39]
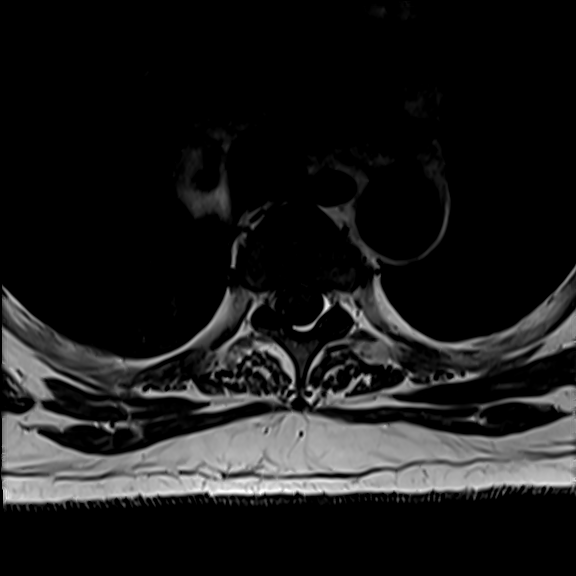
[im 39/39]
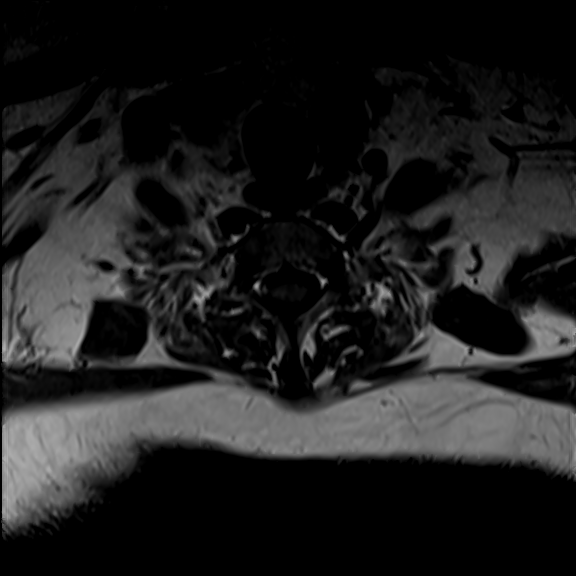

[Series 21: T1 · axial · non-contrast · 4.0mm · 0.33mm/px · z∈[-236,-152]mm · 2 of 15 slices shown (4 of 4)]
[im 1/15]
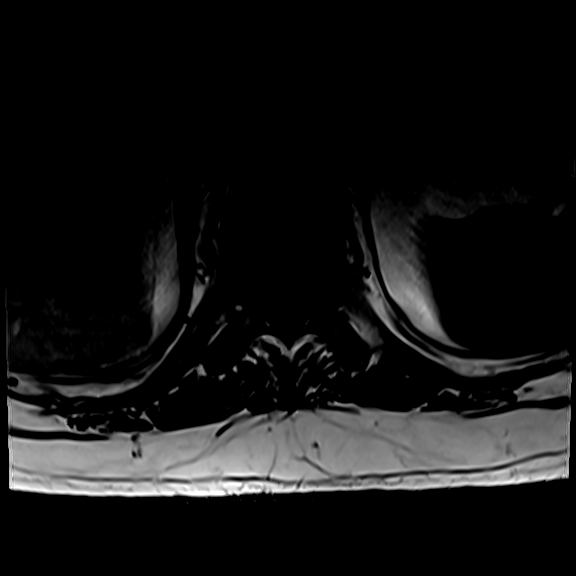
[im 15/15]
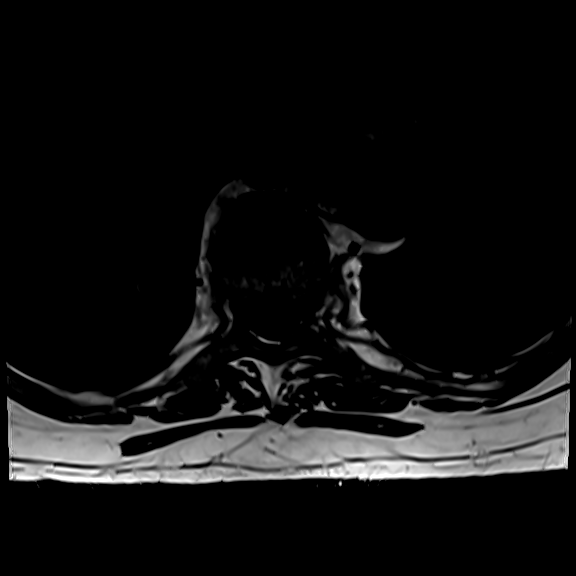

[Series 23: T1 fat-sat post-contrast · sagittal · 3.0mm · 0.93mm/px · 3 of 23 slices shown (1 of 2)]
[im 1/23]
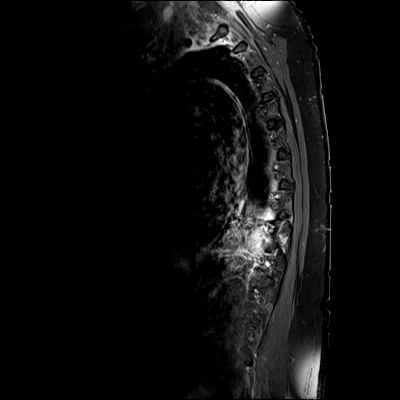
[im 12/23]
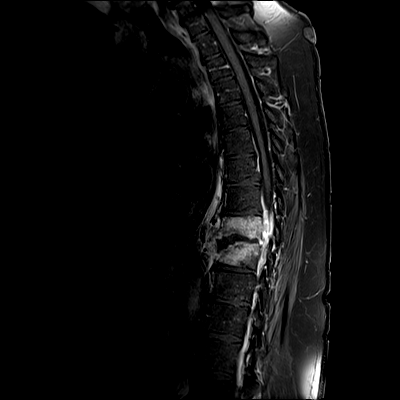
[im 23/23]
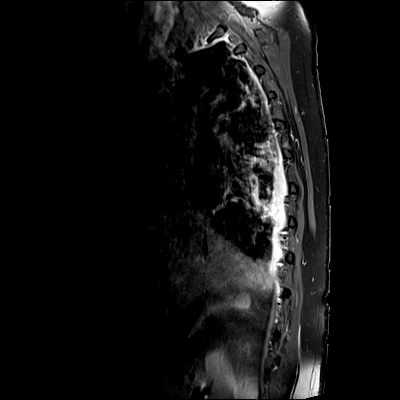

[Series 25: T1 fat-sat post-contrast · sagittal · 3.0mm · 1.16mm/px · 4 of 30 slices shown (2 of 2)]
[im 1/30]
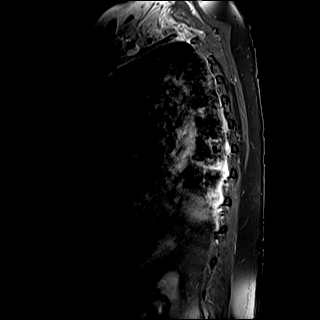
[im 10/30]
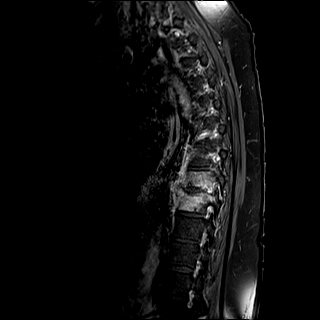
[im 20/30]
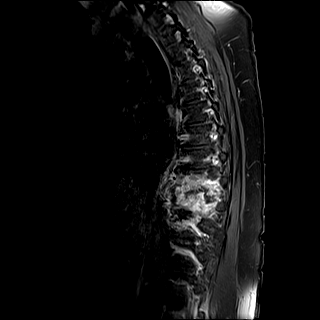
[im 30/30]
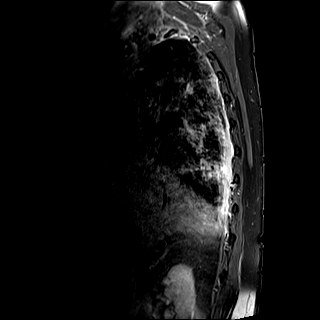

[25 of 48 positions shown; findings below may reference images not displayed]

FINDINGS: Limited cervical spine imaging: Cervical spine degeneration C4-C5
through C6-C7.

Thoracic spine segmentation: Appears to be normal, same numbering
system as last month.

Alignment: Stable thoracic kyphosis from last month. No
spondylolisthesis.

Vertebrae: Progressed and severe marrow edema now throughout the T8
and T9 vertebral bodies with early bilateral pedicle involvement.
Moderately eroded endplates, especially anteriorly, since the MRI
last month. Interval 25% loss of T8 vertebral body height. Eroded T9
anterior superior endplate.

Increased abnormal heterogeneous fluid signal throughout the
abnormal disc space. And there is new abnormal fluid in the
bilateral T8-T9 facets greater on the right.

Progressed and confluent paraspinal phlegmon at that level eccentric
to the right (series 18, image 26). And progressed abnormal
circumferential dural thickening and enhancement now extending from
the mid T7 through the mid T10 levels. See spinal canal details
there below.

Cord:  Normal thoracic spinal cord above T8.

Mild to moderate cord compression now at T8-T9. But no cord edema.
Abnormal dural thickening and enhancement there as stated above. No
enhancement within the substance of the cord.

Below T9 cord signal and morphology appears normal. Conus medullaris
is partially visible at T12-L1.

Paraspinal and other soft tissues: Confluent paraspinal phlegmon at
T8-T9 is mostly anterior and lateral. There is no drainable
paraspinal fluid collection. Small layering pleural effusions.

Disc levels:

T1-T2 through T6-T7: Negative except for small right paracentral
disc protrusion at T5-T6.

T7-T8 through T9-T10: Abnormal moderate dural thickening and
enhancement with epidural inflammation. Circumferential infected
disc osteophyte complex at T8-T9. Possible septic facets. Confluent
inflammation in the bilateral T8 and T9 neural foramina. Mild to
moderate spinal stenosis and spinal cord mass effect.

T10-T11 through T12-L1: Negative.
IMPRESSION: 1. Substantial progression of T8-T9 discitis osteomyelitis since
[DATE].
Eroded endplates with 25% loss of T8 vertebral body height since
that time. Probable septic facet joints now. Bulky increased
paraspinal phlegmon, and confluent epidural and dural thickening and
inflammation.

2. Subsequent increased spinal stenosis and T8-T9 spinal cord mass
effect, but no cord edema. Confluent inflammation in the T8 > T9
neural foramina.

3. Fluid in the disc space but no drainable epidural or paraspinal
abscess.

## 2021-06-12 IMAGING — DX DG ORTHOPANTOGRAM /PANORAMIC
1 series · 1 of 1 positions shown · non-contrast
Comparison: None.

CLINICAL DATA: [0F]

Dental caries.
EXAM:
ORTHOPANTOGRAM/PANORAMIC

[view not recorded]
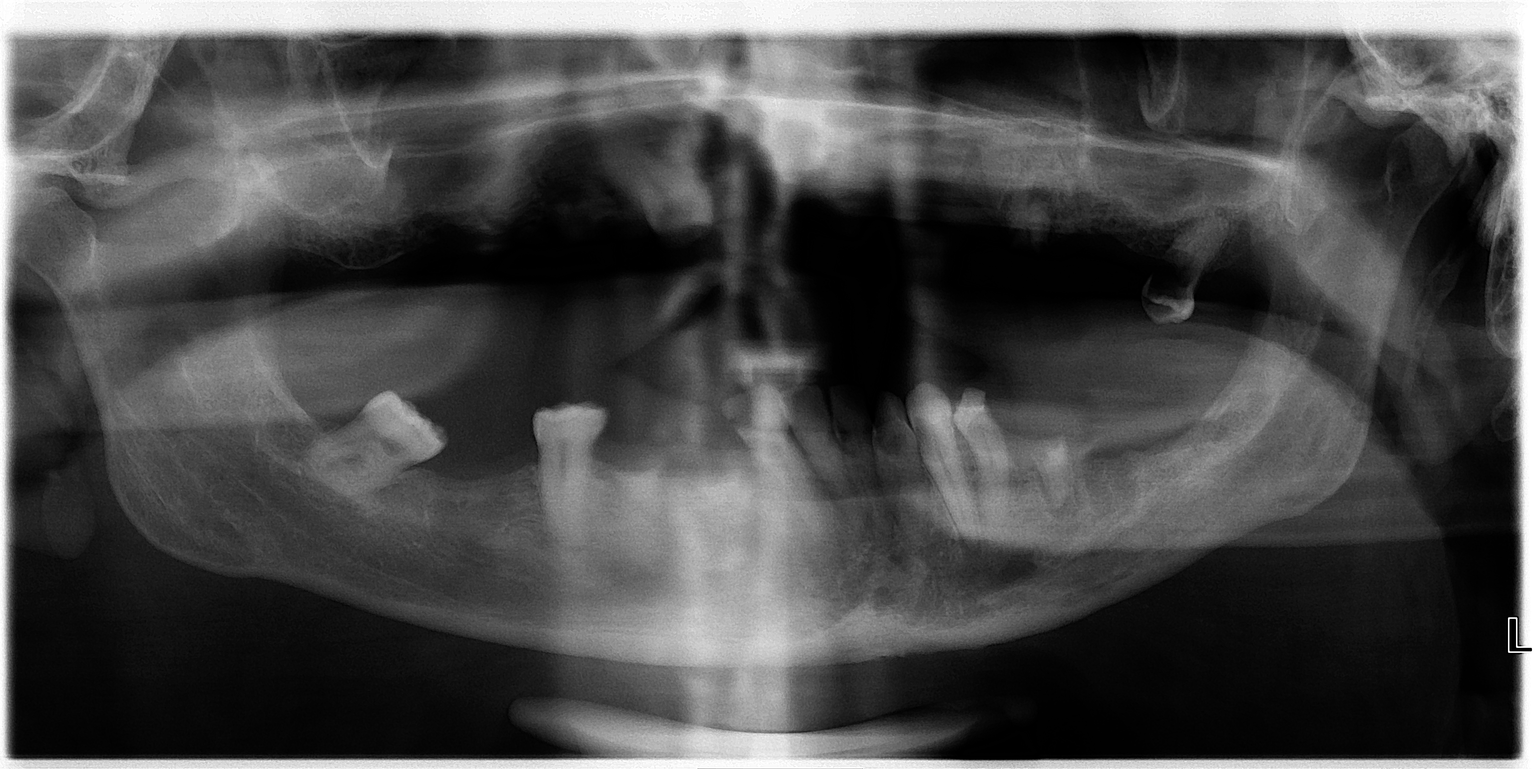

[1 of 1 positions shown; findings below may reference images not displayed]

FINDINGS: Multiple missing teeth. Technique limits evaluation of the left
posterior maxillary molar. The posterior-most left mandibular molar
is fractured with periapical lucency. Possible dental caries of the
left canine and left premolars.
IMPRESSION: Multiple missing teeth with dental caries and periapical lucencies
of the remaining teeth, described above.

## 2021-06-12 IMAGING — CT CT ABD-PELV W/ CM
2 of 5 series · 15 of 46 positions shown, 17 images · IV contrast (Omni 300)
Comparison: [DATE].

CLINICAL DATA: Back pain, nausea and vomiting. Currently being
treated for T8-T9 discitis and vertebral osteomyelitis.

EXAM:
CT ABDOMEN AND PELVIS WITH CONTRAST
TECHNIQUE: Multidetector CT imaging of the abdomen and pelvis was performed
using the standard protocol following bolus administration of
intravenous contrast.

[Series 3: a/p w/ 5mm · axial · 0.98mm/px · z∈[-1364,-844]mm · 12 of 116 slices shown, 14 images]
[im 6/116  soft-tissue]
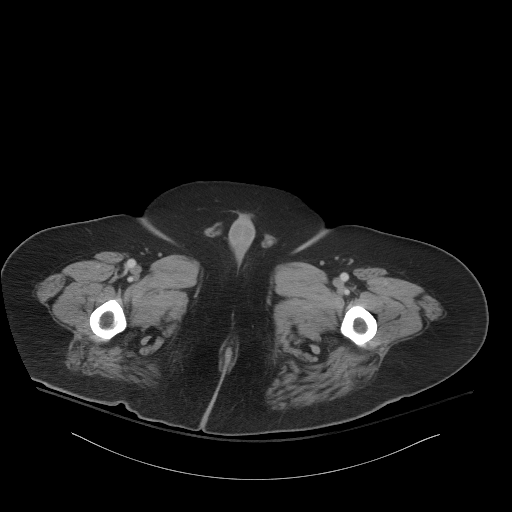
[im 6/116  bone]
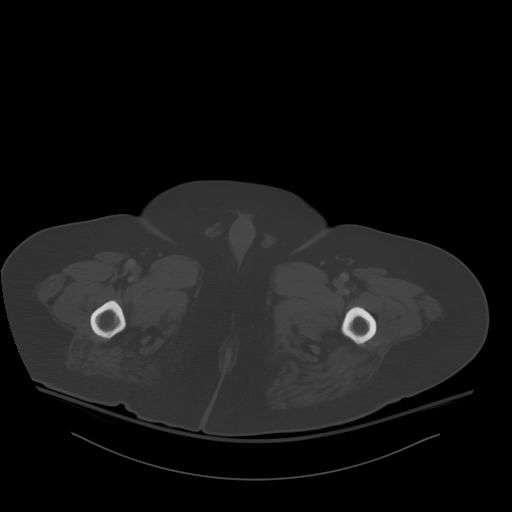
[im 18/116  soft-tissue]
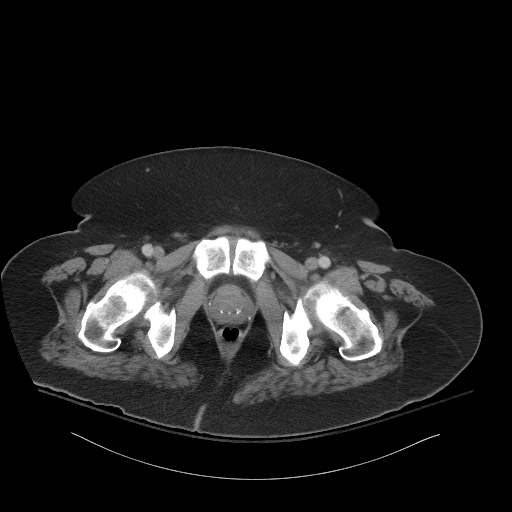
[im 24/116  soft-tissue]
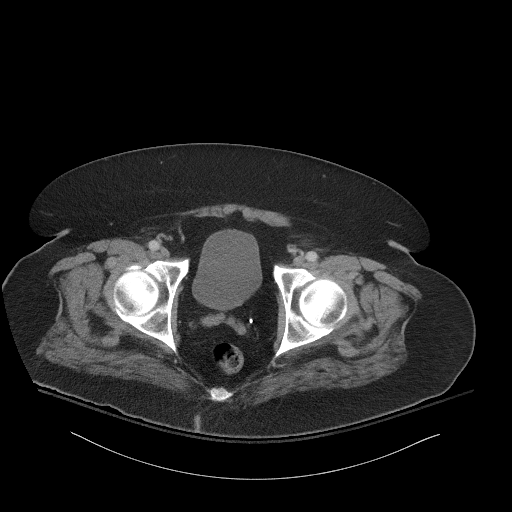
[im 35/116  soft-tissue]
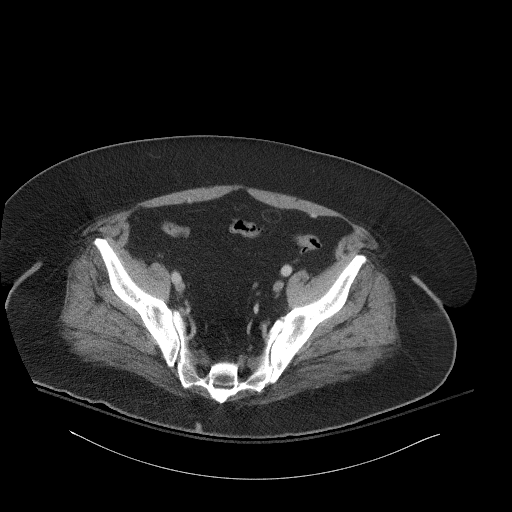
[im 47/116  soft-tissue]
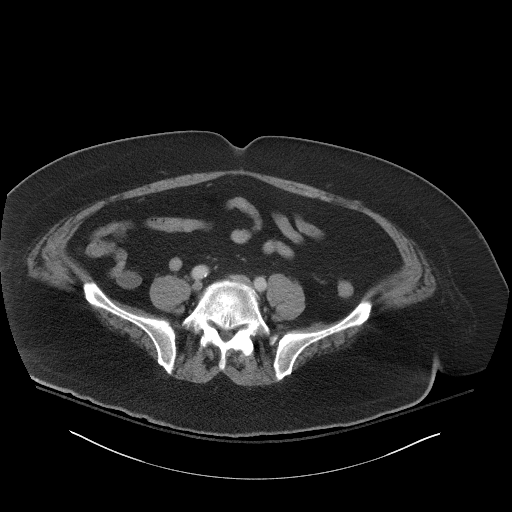
[im 52/116  soft-tissue]
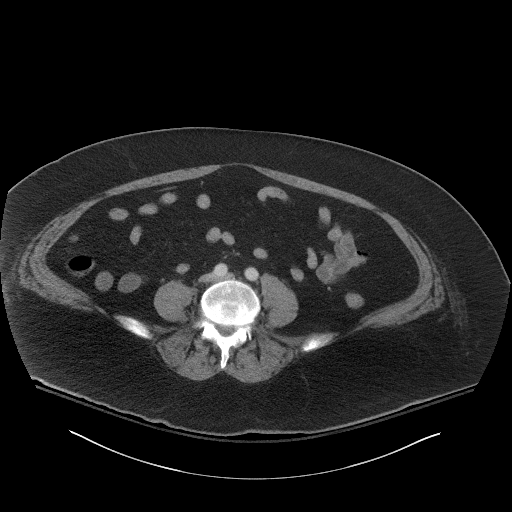
[im 64/116  soft-tissue]
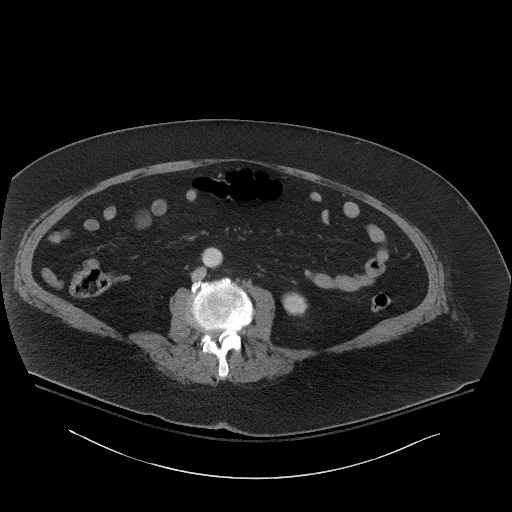
[im 70/116  soft-tissue]
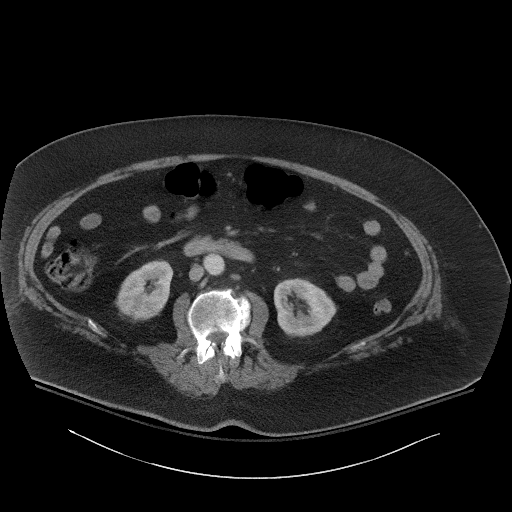
[im 81/116  soft-tissue]
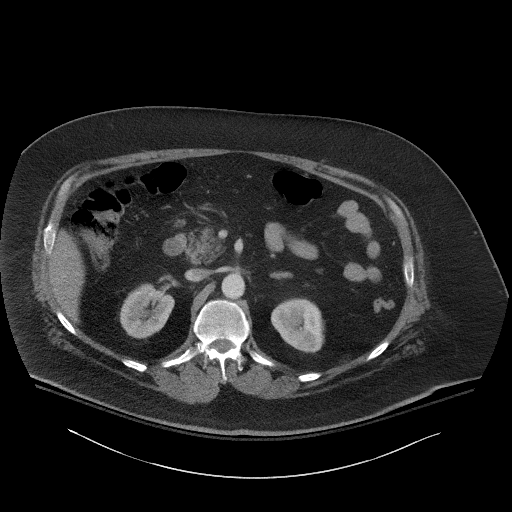
[im 81/116  bone]
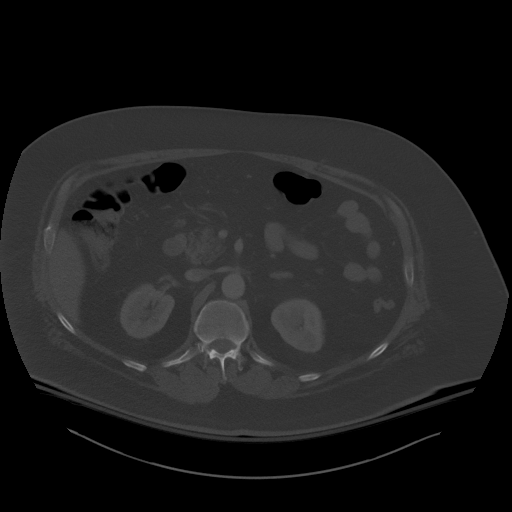
[im 93/116  soft-tissue]
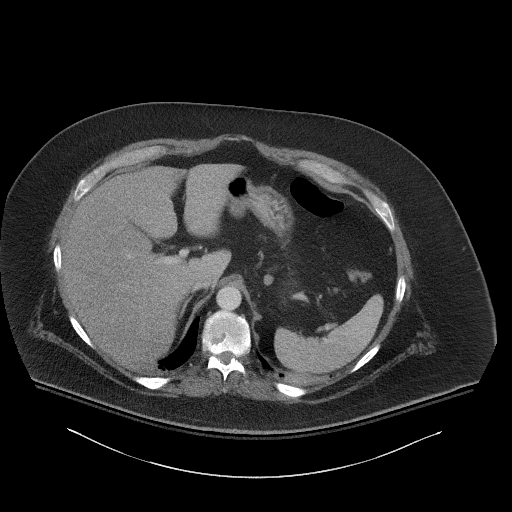
[im 98/116  soft-tissue]
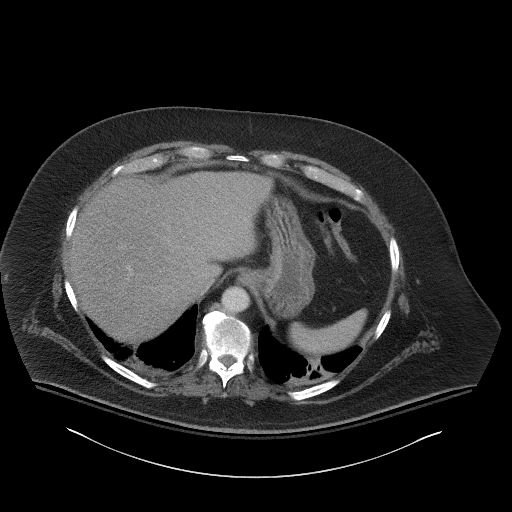
[im 110/116  soft-tissue]
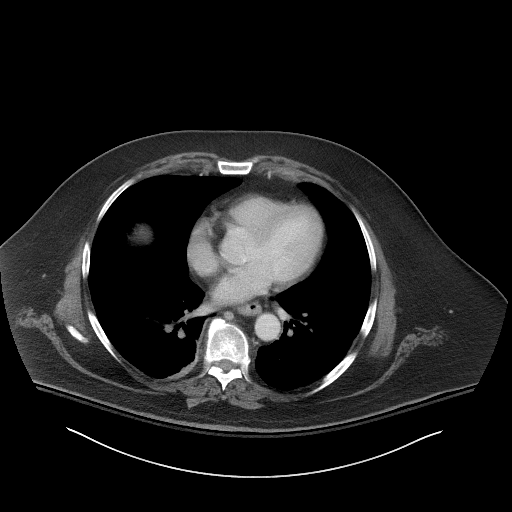

[Series 6: a/p w/ cor · coronal · 1.13mm/px · 3 of 187 slices shown]
[im 63/187  soft-tissue]
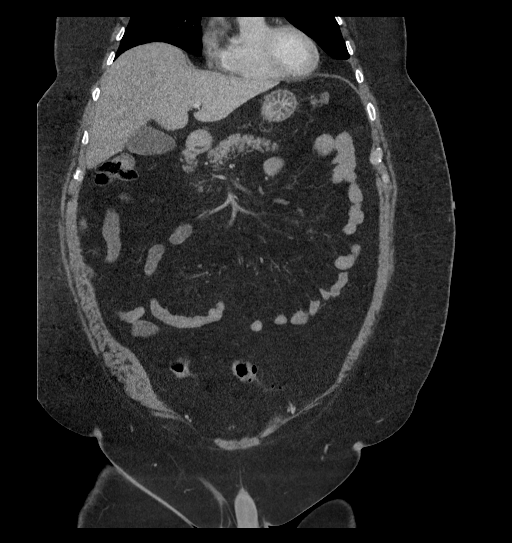
[im 83/187  soft-tissue]
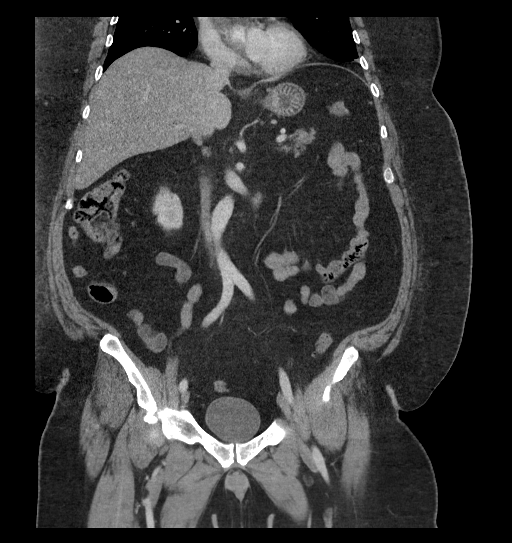
[im 104/187  soft-tissue]
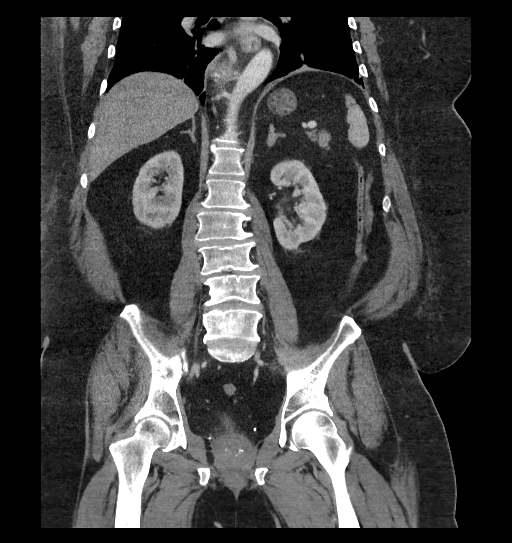

[15 of 46 positions shown; findings below may reference images not displayed]

RADIATION DOSE REDUCTION: This exam was performed according to the
departmental dose-optimization program which includes automated
exposure control, adjustment of the mA and/or kV according to
patient size and/or use of iterative reconstruction technique.

CONTRAST:  100mL OMNIPAQUE IOHEXOL 300 MG/ML  SOLN
FINDINGS: Lower chest: The heart is normal in size and there is a trace
pericardial effusion. Mild atelectasis or infiltrate is noted at the
lung bases.

Hepatobiliary: No focal liver abnormality is seen. Stones are
present in the gallbladder. No biliary ductal dilatation.

Pancreas: Unremarkable. No pancreatic ductal dilatation or
surrounding inflammatory changes.

Spleen: Normal in size without focal abnormality.

Adrenals/Urinary Tract: There is a stable 1.2 cm right adrenal
nodule, previously characterized as an adenoma. There is mild
thickening of the left adrenal gland without evidence of discrete
nodule. The kidneys enhance symmetrically. Subcentimeter
hypodensities are present in the right kidney which are too small to
further characterize. No renal calculus or hydronephrosis. The
bladder is unremarkable.

Stomach/Bowel: Stomach is within normal limits. Appendix appears
normal. No evidence of bowel wall thickening, distention, or
inflammatory changes. There is colonic diverticulosis without
diverticulitis.

Vascular/Lymphatic: No significant vascular findings are present. No
enlarged abdominal or pelvic lymph nodes. Stable rim calcified
nodular densities are noted in the left lower quadrant, likely
representing fat necrosis.

Reproductive: The prostate gland is stable.

Other: No intra-abdominal free fluid. A fat containing umbilical
hernia is noted.

Musculoskeletal: Degenerative changes are present in the
thoracolumbar spine. There are bony erosions and fragmentation
involving T8-T9 endplates which is increased from the prior exam,
compatible with known osteomyelitis/discitis. Increased paraspinal
soft tissue density is noted in this region and there is at least
moderate spinal canal stenosis at this level. Degenerative changes
are present in the thoracolumbar spine. There is a disc herniation,
endplate osteophyte formation and facet arthropathy at L2-L3 and
L4-L5 resulting in severe spinal canal stenosis and not
significantly changed from the prior exam.
IMPRESSION: 1. Interval worsening of known discitis/osteomyelitis at T8-T9 with
increased bony erosions and fragmentation of the endplates at this
level and increased paravertebral inflammatory changes and soft
tissue density. At least moderate spinal canal stenosis is seen at
this level. Repeat MRI with contrast is suggested for further
evaluation.
2. Patchy atelectasis or infiltrate at the lung bases.
3. Cholelithiasis.
4. Stable right adrenal adenoma.
5. Degenerative changes in the lumbar spine with severe spinal canal
stenosis at L2-L3 and L4-L5.

## 2021-06-12 MED ORDER — GADOBUTROL 1 MMOL/ML IV SOLN
10.0000 mL | Freq: Once | INTRAVENOUS | Status: AC | PRN
  Administered 2021-06-12: 10 mL via INTRAVENOUS

## 2021-06-12 MED ORDER — BISACODYL 5 MG PO TBEC
5.0000 mg | DELAYED_RELEASE_TABLET | Freq: Every day | ORAL | Status: DC | PRN
Start: 2021-06-12 — End: 2021-07-02
  Administered 2021-06-19 – 2021-06-25 (×5): 5 mg via ORAL
  Filled 2021-06-12 (×4): qty 1

## 2021-06-12 MED ORDER — TRIAMTERENE-HCTZ 37.5-25 MG PO TABS
1.0000 | ORAL_TABLET | Freq: Every day | ORAL | Status: DC
  Administered 2021-06-13: 1 via ORAL
  Filled 2021-06-12 (×3): qty 1

## 2021-06-12 MED ORDER — LACTATED RINGERS IV SOLN: INTRAVENOUS | Status: DC

## 2021-06-12 MED ORDER — CHLORHEXIDINE GLUCONATE CLOTH 2 % EX PADS
6.0000 | MEDICATED_PAD | Freq: Every day | CUTANEOUS | Status: DC
  Administered 2021-06-13 – 2021-07-02 (×20): 6 via TOPICAL

## 2021-06-12 MED ORDER — SODIUM CHLORIDE 0.9% FLUSH
3.0000 mL | Freq: Two times a day (BID) | INTRAVENOUS | Status: DC
  Administered 2021-06-12 – 2021-07-02 (×22): 3 mL via INTRAVENOUS

## 2021-06-12 MED ORDER — ACETAMINOPHEN 650 MG RE SUPP: 650.0000 mg | Freq: Four times a day (QID) | RECTAL | Status: DC | PRN

## 2021-06-12 MED ORDER — MORPHINE SULFATE (PF) 2 MG/ML IV SOLN
2.0000 mg | INTRAVENOUS | Status: DC | PRN
  Administered 2021-06-13 – 2021-06-16 (×11): 2 mg via INTRAVENOUS
  Filled 2021-06-12 (×13): qty 1

## 2021-06-12 MED ORDER — ONDANSETRON HCL 4 MG/2ML IJ SOLN
4.0000 mg | Freq: Four times a day (QID) | INTRAMUSCULAR | Status: DC | PRN
  Administered 2021-06-12 – 2021-06-16 (×7): 4 mg via INTRAVENOUS
  Filled 2021-06-12 (×7): qty 2

## 2021-06-12 MED ORDER — DOCUSATE SODIUM 100 MG PO CAPS
100.0000 mg | ORAL_CAPSULE | Freq: Two times a day (BID) | ORAL | Status: DC
  Filled 2021-06-12 (×4): qty 1

## 2021-06-12 MED ORDER — POLYETHYLENE GLYCOL 3350 17 G PO PACK
17.0000 g | PACK | Freq: Every day | ORAL | Status: DC | PRN
  Administered 2021-06-19: 17 g via ORAL
  Filled 2021-06-12 (×2): qty 1

## 2021-06-12 MED ORDER — ACETAMINOPHEN 325 MG PO TABS
650.0000 mg | ORAL_TABLET | Freq: Four times a day (QID) | ORAL | Status: DC | PRN
  Administered 2021-06-14 – 2021-06-23 (×3): 650 mg via ORAL
  Filled 2021-06-12 (×3): qty 2

## 2021-06-12 MED ORDER — HYDRALAZINE HCL 20 MG/ML IJ SOLN: 5.0000 mg | INTRAMUSCULAR | Status: DC | PRN

## 2021-06-12 MED ORDER — HYDROMORPHONE HCL 1 MG/ML IJ SOLN
1.0000 mg | Freq: Once | INTRAMUSCULAR | Status: AC
  Administered 2021-06-12: 1 mg via INTRAVENOUS
  Filled 2021-06-12: qty 1

## 2021-06-12 MED ORDER — NYSTATIN 100000 UNIT/ML MT SUSP
5.0000 mL | Freq: Four times a day (QID) | OROMUCOSAL | Status: DC
  Administered 2021-06-12 – 2021-06-13 (×2): 500000 [IU] via ORAL
  Filled 2021-06-12 (×6): qty 5

## 2021-06-12 MED ORDER — IOHEXOL 300 MG/ML  SOLN
100.0000 mL | Freq: Once | INTRAMUSCULAR | Status: AC | PRN
  Administered 2021-06-12: 100 mL via INTRAVENOUS

## 2021-06-12 MED ORDER — ONDANSETRON HCL 4 MG/2ML IJ SOLN
4.0000 mg | Freq: Once | INTRAMUSCULAR | Status: AC
  Administered 2021-06-12: 4 mg via INTRAVENOUS
  Filled 2021-06-12: qty 2

## 2021-06-12 MED ORDER — OXYCODONE HCL 5 MG PO TABS
5.0000 mg | ORAL_TABLET | ORAL | Status: DC | PRN
  Administered 2021-06-12 – 2021-06-14 (×2): 5 mg via ORAL
  Filled 2021-06-12 (×2): qty 1

## 2021-06-12 MED ORDER — SODIUM CHLORIDE 0.9 % IV SOLN
2.0000 g | INTRAVENOUS | Status: AC
  Administered 2021-06-12 – 2021-06-27 (×16): 2 g via INTRAVENOUS
  Filled 2021-06-12 (×18): qty 20

## 2021-06-12 MED ORDER — SODIUM CHLORIDE 0.9 % IV BOLUS
1000.0000 mL | Freq: Once | INTRAVENOUS | Status: AC
  Administered 2021-06-12: 1000 mL via INTRAVENOUS

## 2021-06-12 MED ORDER — ONDANSETRON HCL 4 MG PO TABS
4.0000 mg | ORAL_TABLET | Freq: Four times a day (QID) | ORAL | Status: DC | PRN
  Administered 2021-06-14: 4 mg via ORAL
  Filled 2021-06-12: qty 1

## 2021-06-12 NOTE — Consult Note (Signed)
? ?  Providing Compassionate, Quality Care - Together ? ?Neurosurgery Consult  ?Referring physician: Dr. Donnald Garre ?Reason for referral: Osteodiscitis ? ?Chief Complaint: Worsening mid back pain ? ?History of Present Illness: This is a 61 year old male with a history of T8-9 osteodiscitis, diagnosed in March 2023.  Had been undergoing treatment with IV antibiotics, Ancef, initial disc aspirate grew E. coli, pansensitive.  He states his back pain has been progressively worsening and therefore he was brought to the emergency department due to severe pain.  He was having difficulty getting up due to generalized weakness, also complained of nausea and vomiting and difficulty with oral intake.  He denies any bowel or bladder changes, denies any groin numbness.  Denies any focal weakness in his legs, complains of some intermittent left calf numbness while standing. ? ? ?Medications: I have reviewed the patient's current medications. ?Allergies: No Known Allergies ? ?History reviewed. No pertinent family history. ?Social History:  has no history on file for tobacco use, alcohol use, and drug use. ? ?ROS: ?All pertinent positives and negatives are listed in HPI above ? ?Physical Exam:  ?Vital signs in last 24 hours: ?Temp:  [98 ?F (36.7 ?C)-98.3 ?F (36.8 ?C)] 98 ?F (36.7 ?C) (07/25 1814) ?Pulse Rate:  [58-128] 65 (07/26 0746) ?Resp:  [11-18] 14 (07/26 0217) ?BP: (138-182)/(65-125) 153/88 (07/26 0700) ?SpO2:  [91 %-98 %] 96 % (07/26 0746) ?PE: ?Awake alert oriented x3, no acute distress ?PERRLA ?Face symmetric ?Speech fluent and appropriate ?Cranial nerves II through XII intact ?Bilateral upper extremity full strength and symmetric bilaterally ?Bilateral lower extremity strength 4+/5 throughout ?No clonus bilateral lower extremities ?Sensory intact to light touch throughout upper and lower extremities and trunk ? ? ?Imaging: ?MRI thoracic spine reviewed, worsening T8-9 osteodiscitis with endplate erosion.  There is no severe  stenosis or cord signal change.  There appears to be septic arthropathy of the facet joints.  CT abdomen pelvis, reviewed, shows again some endplate erosion due to progression of osteodiscitis.  There appears to be no frank instability at this time.  There is no cord signal change or malalignment. ? ?Impression/Assessment:  ?61 year old male with ? ?T8-9 osteodiscitis without epidural abscess ? ?Plan:  ?-Recommend continue medical management.  I do not recommend any surgical intervention at this time given no severe stenosis on MRI causing cord compression.  There is no frank instability on imaging at this time. ?-Continue pain control as needed ?-ID consulted for further evaluation, appreciate their recommendations for continued antibiotic care, possible reaspiration of disc given failure of Ancef ? ? ? ?Thank you for allowing me to participate in this patient's care.  Please do not hesitate to call with questions or concerns. ? ? ?Arend Bahl, DO ?Neurosurgeon ?[facility] ?Cell: [phone] ? ? ? ? ? ? ?

## 2021-06-12 NOTE — ED Notes (Signed)
Patient transported to MRI 

## 2021-06-12 NOTE — ED Notes (Signed)
Infec.Disease MD at bedside  ?

## 2021-06-12 NOTE — H&P (Signed)
?History and Physical  ? ? ?Patient: Michael [name] FMB:[id number] DOB: 12-Nov-1960 ?DOA: 06/11/2021 ?DOS: the patient was seen and examined on 06/12/2021 ?PCP: Lavella Lemons, PA  ?Patient coming from: Home - lives with wife, daughter and her boyfriend; NOK: Wife, Idris Edmundson, [phone] ? ? ?Chief Complaint: Back pain ? ?HPI: Michael [name] is a 61 y.o. male with medical history significant of HTN and obesity presenting with back pain.  He was previously hospitalized from 3/17-22 with T8-9 discitis and E coli bacteremia following recent dental infection.   His antibiotics are scheduled to continue through 4/28 but he has had persistent n/v.   He is continuing to have severe back pain.  He has been to [facility] twice for pain and n/v.  The n/v is still present but improved from prior; he did vomit once a small amount this AM.  No fevers.  He is taking his daily Rocephin and has tolerated it reasonably.  His left leg has been tingling a bit.  No difficulty with voiding/stooling; he did take meds for constipation once.  He is not eating at all and is not moving much.  She thinks that emesis made his mouth and throat raw.  He has partial dentures and constantly gets mouth infections, most recently before last hospitalization.  He took his mother's antibiotics for jaw pain prior to last hospitalization. ? ? ? ?ER Course:  Refractory osteo, persistent n/v.  Poor oral intake, generalized weakness.  Likely needs admission for hydration, change in antibiotics.   ? ? ? ? ?Review of Systems: As mentioned in the history of present illness. All other systems reviewed and are negative. ?Past Medical History:  ?Diagnosis Date  ? Class 1 obesity due to excess calories with body mass index (BMI) of 34.0 to 34.9 in adult   ? Diskitis   ? Hypertension   ? ?Past Surgical History:  ?Procedure Laterality Date  ? IR CERVICAL/THORACIC DISC ASPIRATION W/IMAG GUIDE  05/18/2021  ? ?Social History:  reports that he has never smoked. He has never  used smokeless tobacco. He reports that he does not currently use alcohol. He reports that he does not use drugs. ? ?No Known Allergies ? ?History reviewed. No pertinent family history. ? ?Prior to Admission medications   ?Medication Sig Start Date End Date Taking? Authorizing Provider  ?cefTRIAXone Sodium (ROCEPHIN IV) See admin instructions. 20 mg picc line every morning until discontinued by MD.   Yes [provider]  ?oxyCODONE-acetaminophen (PERCOCET/ROXICET) 5-325 MG tablet Take 1 tablet by mouth every 8 (eight) hours as needed for moderate pain. 05/29/21  Yes [provider]  ?promethazine (PHENERGAN) 25 MG suppository Place 1 suppository (25 mg total) rectally every 6 (six) hours as needed for up to 5 days for nausea or vomiting. 06/04/21 06/12/21 Yes Vu, Rockey Situ, MD  ?promethazine (PHENERGAN) 25 MG tablet Take 1 tablet (25 mg total) by mouth every 6 (six) hours as needed for nausea or vomiting. 05/28/21  Yes Vu, Rockey Situ, MD  ?promethazine (PHENERGAN) 6.25 MG/5ML syrup Take 20 mLs (25 mg total) by mouth every 6 (six) hours as needed for nausea or vomiting. 06/09/21  Yes Esmond Plants, RPH-CPP  ?triamterene-hydrochlorothiazide (MAXZIDE-25) 37.5-25 MG tablet Take 1 tablet by mouth daily. 04/24/21  Yes [provider]  ?ceFAZolin (ANCEF) IVPB Inject 2 g into the vein every 8 (eight) hours. Indication:  E coli bacteremia/discitis ?First Dose: Yes ?Last Day of Therapy:  06/27/21 ?Labs - Once weekly:  CBC/D  and BMP, ?Labs - Every other week:  ESR and CRP ?Method of administration: IV Push ?Method of administration may be changed at the discretion of home infusion pharmacist based upon assessment of the patient and/or caregiver's ability to self-administer the medication ordered. ?Patient not taking: Reported on 06/12/2021 05/21/21 06/28/21  Terrilee Croak, MD  ?ondansetron (ZOFRAN) 8 MG tablet Take 1 tablet (8 mg total) by mouth every 8 (eight) hours as needed for nausea or vomiting. ?Patient not  taking: Reported on 06/04/2021 05/23/21   Jabier Mutton, MD  ? ? ?Physical Exam: ?Vitals:  ? 06/12/21 0830 06/12/21 0930 06/12/21 1030 06/12/21 1404  ?BP: 110/85 119/78 116/79 (!) 143/98  ?Pulse: (!) 104 (!) 101 95 96  ?Resp: _0 ?Temp:      ?TempSrc:      ?SpO2: 96% 95% 93% 96%  ?Weight:      ?Height:      ? ?General:  Appears calm and comfortable and is in NAD ?Eyes:   EOMI, normal lids, iris ?ENT:  grossly normal hearing, lips; + oral thrush, mmm; suboptimal dentition ?Neck:  no LAD, masses or thyromegaly ?Cardiovascular:  RRR, no m/r/g. No LE edema.  ?Respiratory:   CTA bilaterally with no wheezes/rales/rhonchi.  Normal respiratory effort. ?Abdomen:  soft, NT, ND ?Back:   normal alignment, no CVAT, no point TTP ?Skin:  no rash or induration seen on limited exam ?Musculoskeletal:  grossly normal tone BUE/BLE, good ROM, no bony abnormality ?Psychiatric:  blunted mood and affect, speech fluent and appropriate, AOx3 ?Neurologic:  CN 2-12 grossly intact, moves all extremities in coordinated fashion ? ? ?Radiological Exams on Admission: ?Independently reviewed - see discussion in A/P where applicable ? ?MR THORACIC SPINE W CONTRAST ? ?Result Date: 06/12/2021 ?CLINICAL DATA:  61 year old male with increasing back pain. T8-T9 discitis osteomyelitis diagnosed in March. EXAM: MRI THORACIC SPINE WITH CONTRAST TECHNIQUE: Multiplanar, multisequence MR imaging of the thoracic spine was performed following the administration of intravenous contrast. COMPARISON:  CT Abdomen and Pelvis 0142 hours today. Thoracic spine MRI 05/16/2021. FINDINGS: Limited cervical spine imaging: Cervical spine degeneration C4-C5 through C6-C7. Thoracic spine segmentation: Appears to be normal, same numbering system as last month. Alignment: Stable thoracic kyphosis from last month. No spondylolisthesis. Vertebrae: Progressed and severe marrow edema now throughout the T8 and T9 vertebral bodies with early bilateral pedicle involvement. Moderately  eroded endplates, especially anteriorly, since the MRI last month. Interval 25% loss of T8 vertebral body height. Eroded T9 anterior superior endplate. Increased abnormal heterogeneous fluid signal throughout the abnormal disc space. And there is new abnormal fluid in the bilateral T8-T9 facets greater on the right. Progressed and confluent paraspinal phlegmon at that level eccentric to the right (series 18, image 26). And progressed abnormal circumferential dural thickening and enhancement now extending from the mid T7 through the mid T10 levels. See spinal canal details there below. Cord:  Normal thoracic spinal cord above T8. Mild to moderate cord compression now at T8-T9. But no cord edema. Abnormal dural thickening and enhancement there as stated above. No enhancement within the substance of the cord. Below T9 cord signal and morphology appears normal. Conus medullaris is partially visible at T12-L1. Paraspinal and other soft tissues: Confluent paraspinal phlegmon at T8-T9 is mostly anterior and lateral. There is no drainable paraspinal fluid collection. Small layering pleural effusions. Disc levels: T1-T2 through T6-T7: Negative except for small right paracentral disc protrusion at T5-T6. T7-T8 through T9-T10: Abnormal moderate dural thickening and enhancement with epidural  inflammation. Circumferential infected disc osteophyte complex at T8-T9. Possible septic facets. Confluent inflammation in the bilateral T8 and T9 neural foramina. Mild to moderate spinal stenosis and spinal cord mass effect. T10-T11 through T12-L1: Negative. IMPRESSION: 1. Substantial progression of T8-T9 discitis osteomyelitis since 05/16/2021. Eroded endplates with 77% loss of T8 vertebral body height since that time. Probable septic facet joints now. Bulky increased paraspinal phlegmon, and confluent epidural and dural thickening and inflammation. 2. Subsequent increased spinal stenosis and T8-T9 spinal cord mass effect, but no cord  edema. Confluent inflammation in the T8 > T9 neural foramina. 3. Fluid in the disc space but no drainable epidural or paraspinal abscess. Electronically Signed   By: Genevie Ann M.D.   On: 06/12/2021 07:05

## 2021-06-12 NOTE — ED Notes (Signed)
Patient transported to CT 

## 2021-06-12 NOTE — ED Notes (Signed)
Admit MD at bedside

## 2021-06-12 NOTE — Final Consult Note (Signed)
?   ? ? ? ? ?[facility]   ? ?Date of Admission:  06/11/2021   Total days of inpatient antibiotics 1 ? ?      ?Reason for Consult: Vertebral osteomyelitis    ?Principal Problem: ?  Thoracic discitis ? ? 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 ? ?#Progressing T8-T9 osteomyelitis with increasing paraspinal phlegmon with Ecoli ?-3/17 blood Cx+ Ecoli, 3/19 Aspirate Cx+ Ecoli. ?-Abx: cefazolin 3/21-3/31(N/V)->ceftriaxone 4/1-p ?-CT AP showed chololithiasis, possible infiltrate vs atelectasis lung base. Pt has a chronic cough per wife and is on RA, favor atelectatics.  ?-MRI shows worsening  findings of osteo/phlegmon and back pain increased on appropriate antibiotics as such would engage NSY ?Recommendations:  ?-Continue ceftriaxone ?-Follow blood  and Urine Cx ?-Engage neurosurgery ?Microbiology:   ?Antibiotics: ?Ceftriaxone 4/1-p ?Prior to hospitalization ?Cefazolin 3/21-3/31 ?3/17-3/20 vanc and ctx ?Cultures: ?Blood ?4/12 p ?Urine ?4/12 p ? ? ? ?HPI: Michael [name] is a 61 y.o. male with obesity, HTN, T8-T9 osteomyelitis/discitis with Ecoli(IR aspiration on 3/19) and Ecoli bacteremia(3/17) placed on cefazolin x 8 weeks c/b nausea and vomiting attributed to cefazolin which was changed to ceftriaxone(seen by ID outpatient Dr. Gale Journey on 06/04/21) presented with worsening back pain. On arrival to ED, vitals stable wbc 12K. MRI showed progression for T8-T9 discitis/osteomyelitis, probable septic facet joint, bulky increased paraspinal phlegmon. ?Wife was at bedside. They report adherence to antibiotics(missed one dose of cefazolin). She reports patient's N/V has improved since switching to ceftriaxone. Denies fever, chills, SHOB.  ? ? ?Review of Systems: ?Review of Systems  ?All other systems reviewed and are negative. ? ?Past Medical History:  ?Diagnosis Date  ? Diskitis   ? Hypertension   ? ? ?Social  History  ? ?Tobacco Use  ? Smoking status: Never  ? Smokeless tobacco: Never  ?Vaping Use  ? Vaping Use: Never used  ?Substance Use Topics  ? Alcohol use: Not Currently  ? Drug use: Never  ? ? ?History reviewed. No pertinent family history. ?Scheduled Meds: ? docusate sodium  100 mg Oral BID  ? sodium chloride flush  3 mL Intravenous Q12H  ? triamterene-hydrochlorothiazide  1 tablet Oral Daily  ? ?Continuous Infusions: ? cefTRIAXone (ROCEPHIN)  IV Stopped (06/12/21 1056)  ? lactated ringers    ? ?PRN Meds:.acetaminophen **OR** acetaminophen, bisacodyl, hydrALAZINE, morphine injection, ondansetron **OR** ondansetron (ZOFRAN) IV, oxyCODONE, polyethylene glycol ?No Known Allergies ? ?OBJECTIVE: ?Blood pressure 116/79, pulse 95, temperature 97.9 ?F (36.6 ?C), temperature source Oral, resp. rate 17, height 5\' 11"  (1.803 m), weight 113.4 kg, SpO2 93 %. ? ?Physical Exam ?Constitutional:   ?   General: He is not in acute distress. ?   Appearance: He is normal weight. He is not toxic-appearing.  ?HENT:  ?   Head: Normocephalic and atraumatic.  ?   Right Ear: External ear normal.  ?   Left Ear: External ear normal.  ?   Nose: No congestion or rhinorrhea.  ?   Mouth/Throat:  ?   Mouth: Mucous membranes are moist.  ?   Pharynx: Oropharynx is clear.  ?Eyes:  ?   Extraocular Movements: Extraocular movements intact.  ?   Conjunctiva/sclera: Conjunctivae normal.  ?   Pupils: Pupils are equal, round, and reactive to light.  ?Cardiovascular:  ?   Rate and Rhythm: Normal rate and regular rhythm.  ?   Heart sounds: No murmur heard. ?  No friction rub. No gallop.  ?  Pulmonary:  ?   Effort: Pulmonary effort is normal.  ?   Breath sounds: Normal breath sounds.  ?Abdominal:  ?   General: Abdomen is flat. Bowel sounds are normal.  ?   Palpations: Abdomen is soft.  ?Musculoskeletal:     ?   General: No swelling. Normal range of motion.  ?   Cervical back: Normal range of motion and neck supple.  ?Skin: ?   General: Skin is warm and dry.   ?Neurological:  ?   General: No focal deficit present.  ?   Mental Status: He is oriented to person, place, and time.  ?Psychiatric:     ?   Mood and Affect: Mood normal.  ? ? ?Lab Results ?Lab Results  ?Component Value Date  ? WBC 12.3 (H) 06/11/2021  ? HGB 16.2 06/11/2021  ? HCT 49.4 06/11/2021  ? MCV 89.0 06/11/2021  ? PLT 309 06/11/2021  ?  ?Lab Results  ?Component Value Date  ? CREATININE 0.99 06/11/2021  ? BUN 12 06/11/2021  ? NA 133 (L) 06/11/2021  ? K 3.2 (L) 06/11/2021  ? CL 98 06/11/2021  ? CO2 23 06/11/2021  ?  ?Lab Results  ?Component Value Date  ? ALT 109 (H) 06/11/2021  ? AST 51 (H) 06/11/2021  ? ALKPHOS 108 06/11/2021  ? BILITOT 0.8 06/11/2021  ?  ? ? ? ?Laurice Record, MD ?[facility] ?[facility] ?06/12/2021, 11:26 AM  ?

## 2021-06-12 NOTE — ED Notes (Signed)
Pt requesting IV pain meds. MD notified awaiting orders  ?

## 2021-06-12 NOTE — ED Provider Notes (Signed)
?[facility] ?Provider Note ? ? ?CSN: [id number] ?Arrival date & time: 06/11/21  2035 ? ?  ? ?History ? ?Chief Complaint  ?Patient presents with  ? Back Pain  ? ? ?Michael [name] is a 61 y.o. male. ? ?Patient is a 61 year old male with past medical history of hypertension and recently diagnosed osteomyelitis of the T8 and T9 vertebrae currently receiving ceftriaxone through PICC line at home.  Patient presenting today with complaints of back pain, decreased appetite, weakness, and abdominal pain.  He has been seen recently with similar complaints, however no definitive cause was found.  He did have gallstones identified on an ultrasound which were believed to be incidental and not the cause of his symptoms.  He reports being scheduled for an outpatient CT scan of his abdomen in the near future, however this has not been performed.  He denies to me he is having any fevers or chills.  He denies any numbness of the extremities or bowel or bladder incontinence. ? ? ?The history is provided by the patient and the spouse.  ?Back Pain ?Location:  Thoracic spine ?Quality:  Aching ?Radiates to:  Does not radiate ?Pain severity:  Severe ? ?  ? ?Home Medications ?Prior to Admission medications   ?Medication Sig Start Date End Date Taking? Authorizing Provider  ?Aspirin-Salicylamide-Caffeine (BC HEADACHE POWDER PO) Take by mouth. ?Patient not taking: Reported on 05/28/2021    [provider]  ?calcium carbonate (TUMS - DOSED IN MG ELEMENTAL CALCIUM) 500 MG chewable tablet Chew 1 tablet by mouth daily. ?Patient not taking: Reported on 05/28/2021    [provider]  ?ceFAZolin (ANCEF) IVPB Inject 2 g into the vein every 8 (eight) hours. Indication:  E coli bacteremia/discitis ?First Dose: Yes ?Last Day of Therapy:  06/27/21 ?Labs - Once weekly:  CBC/D and BMP, ?Labs - Every other week:  ESR and CRP ?Method of administration: IV Push ?Method of administration may be changed at  the discretion of home infusion pharmacist based upon assessment of the patient and/or caregiver's ability to self-administer the medication ordered. ?Patient not taking: Reported on 06/04/2021 05/21/21 06/28/21  Terrilee Croak, MD  ?ondansetron (ZOFRAN) 8 MG tablet Take 1 tablet (8 mg total) by mouth every 8 (eight) hours as needed for nausea or vomiting. ?Patient not taking: Reported on 06/04/2021 05/23/21   Prudencio Pair T, MD  ?oxyCODONE-acetaminophen (PERCOCET/ROXICET) 5-325 MG tablet Take 2 tablets by mouth 2 (two) times daily. 05/29/21   [provider]  ?promethazine (PHENERGAN) 25 MG suppository Place 1 suppository (25 mg total) rectally every 6 (six) hours as needed for up to 5 days for nausea or vomiting. 06/04/21 06/09/21  Vu, Rockey Situ, MD  ?promethazine (PHENERGAN) 25 MG tablet Take 1 tablet (25 mg total) by mouth every 6 (six) hours as needed for nausea or vomiting. 05/28/21   Vu, Rockey Situ, MD  ?promethazine (PHENERGAN) 6.25 MG/5ML syrup Take 20 mLs (25 mg total) by mouth every 6 (six) hours as needed for nausea or vomiting. 06/09/21   Esmond Plants, RPH-CPP  ?psyllium (REGULOID) 0.52 g capsule Take 0.52 g by mouth daily. ?Patient not taking: Reported on 06/04/2021    [provider]  ?triamterene-hydrochlorothiazide (MAXZIDE-25) 37.5-25 MG tablet Take 1 tablet by mouth daily. 04/24/21   [provider]  ?   ? ?Allergies    ?Patient has no known allergies.   ? ?Review of Systems   ?Review of Systems  ?Musculoskeletal:  Positive for back  pain.  ?All other systems reviewed and are negative. ? ?Physical Exam ?Updated Vital Signs ?BP (!) 139/98 (BP Location: Left Arm)   Pulse (!) 120   Temp 97.9 ?F (36.6 ?C) (Oral)   Resp 16   Ht $R'5\' 11"'rk$  (1.803 m)   Wt 113.4 kg   SpO2 96%   BMI 34.87 kg/m?  ?Physical Exam ?Vitals and nursing note reviewed.  ?Constitutional:   ?   General: He is not in acute distress. ?   Appearance: He is well-developed. He is not diaphoretic.  ?HENT:  ?   Head: Normocephalic  and atraumatic.  ?Cardiovascular:  ?   Rate and Rhythm: Normal rate and regular rhythm.  ?   Heart sounds: No murmur heard. ?  No friction rub.  ?Pulmonary:  ?   Effort: Pulmonary effort is normal. No respiratory distress.  ?   Breath sounds: Normal breath sounds. No wheezing or rales.  ?Abdominal:  ?   General: Bowel sounds are normal. There is no distension.  ?   Palpations: Abdomen is soft.  ?   Tenderness: There is no abdominal tenderness.  ?Musculoskeletal:     ?   General: Normal range of motion.  ?   Cervical back: Normal range of motion and neck supple.  ?Skin: ?   General: Skin is warm and dry.  ?Neurological:  ?   Mental Status: He is alert and oriented to person, place, and time.  ?   Coordination: Coordination normal.  ?   Comments: Strength is 5 out of 5 in both lower extremities.  DTRs are 1+ and symmetrical in the patellar and Achilles tendons.  ? ? ?ED Results / Procedures / Treatments   ?Labs ?(all labs ordered are listed, but only abnormal results are displayed) ?Labs Reviewed  ?LACTIC ACID, PLASMA - Abnormal; Notable for the following components:  ?    Result Value  ? Lactic Acid, Venous 2.4 (*)   ? All other components within normal limits  ?COMPREHENSIVE METABOLIC PANEL - Abnormal; Notable for the following components:  ? Sodium 133 (*)   ? Potassium 3.2 (*)   ? Glucose, Bld 178 (*)   ? Albumin 2.8 (*)   ? AST 51 (*)   ? ALT 109 (*)   ? All other components within normal limits  ?CBC WITH DIFFERENTIAL/PLATELET - Abnormal; Notable for the following components:  ? WBC 12.3 (*)   ? Neutro Abs 9.1 (*)   ? Monocytes Absolute 1.2 (*)   ? Abs Immature Granulocytes 0.27 (*)   ? All other components within normal limits  ?PROTIME-INR - Abnormal; Notable for the following components:  ? Prothrombin Time 16.6 (*)   ? INR 1.4 (*)   ? All other components within normal limits  ?C-REACTIVE PROTEIN - Abnormal; Notable for the following components:  ? CRP 10.2 (*)   ? All other components within normal limits   ?SEDIMENTATION RATE - Abnormal; Notable for the following components:  ? Sed Rate 40 (*)   ? All other components within normal limits  ?CULTURE, BLOOD (ROUTINE X 2)  ?CULTURE, BLOOD (ROUTINE X 2)  ?URINE CULTURE  ?APTT  ?LACTIC ACID, PLASMA  ?URINALYSIS, ROUTINE W REFLEX MICROSCOPIC  ? ? ?EKG ?EKG Interpretation ? ?Date/Time:  Wednesday June 11 2021 22:53:48 EDT ?Ventricular Rate:  129 ?PR Interval:  130 ?QRS Duration: 82 ?QT Interval:  304 ?QTC Calculation: 445 ?R Axis:   20 ?Text Interpretation: Sinus tachycardia Otherwise normal ECG When compared with ECG  of 23-May-2021 23:10, PREVIOUS ECG IS PRESENT Since last tracing rate faster Confirmed by Dorie Rank [phone]) on 06/12/2021 8:53:44 AM ? ?Radiology ?No results found. ? ?Procedures ?Procedures  ? ? ?Medications Ordered in ED ?Medications  ?sodium chloride 0.9 % bolus 1,000 mL (has no administration in time range)  ?HYDROmorphone (DILAUDID) injection 1 mg (has no administration in time range)  ?ondansetron (ZOFRAN) injection 4 mg (has no administration in time range)  ? ? ?ED Course/ Medical Decision Making/ A&P ? ?Patient with history of recently diagnosed osteomyelitis of the T8 and T9 vertebral bodies receiving Rocephin through PICC line.  He presents with complaints of back pain, weakness, fatigue, and generally unwell.  He has had decreased appetite. ? ?Thus far work-up shows a mild leukocytosis, but electrolytes that are essentially unremarkable.  A CT scan of the abdomen and pelvis was obtained showing no acute intra-abdominal process, but does show worsening progression of osteomyelitis at T8 and T9 with an MRI being recommended for further evaluation. ? ?Care signed out to oncoming provider at shift change.  Dr. Vallery Ridge will obtain the results of the MRI and determine the final disposition.  Admission is anticipated. ? ?Final Clinical Impression(s) / ED Diagnoses ?Final diagnoses:  ?None  ? ? ?Rx / DC Orders ?ED Discharge Orders   ? ? None  ? ?  ? ? ?   ?Veryl Speak, MD ?06/13/21 [phone] ? ?

## 2021-06-12 NOTE — ED Provider Notes (Signed)
E. Coli osteomyelitis. PICC line getting rocephin. MRI to further eval T8T9 osteo. Possible admission for worsening. ?Physical Exam  ?BP (!) 150/93   Pulse (!) 102   Temp 97.9 ?F (36.6 ?C) (Oral)   Resp 18   Ht 5\' 11"  (1.803 m)   Wt 113.4 kg   SpO2 99%   BMI 34.87 kg/m?  ? ?Physical Exam ? ?Procedures  ?Procedures ? ?ED Course / MDM  ?  ?Medical Decision Making ?Amount and/or Complexity of Data Reviewed ?Labs: ordered. ?Radiology: ordered. ? ?Risk ?Prescription drug management. ?Decision regarding hospitalization. ? ? ?MRI shows significantly worsening osteomyelitis.  Patient is getting increasing generalized weakness at home.  Difficulty getting up to ambulate.  Vomiting and nausea preventing oral intake.  Plan for admission. ? ?Consult: Reviewed with Dr. Lorin Mercy for admission. ?Consult: Reviewed with Dr. Candiss Norse infectious disease, will do consult for antibiotic management. ?Consult: Neurosurgery consulted, Dr Shelba Flake will consult ? ? ? ? ?  ?Charlesetta Shanks, MD ?06/12/21 0820 ? ?

## 2021-06-13 ENCOUNTER — Encounter (HOSPITAL_COMMUNITY): Payer: Self-pay | Admitting: Internal Medicine

## 2021-06-13 ENCOUNTER — Inpatient Hospital Stay (HOSPITAL_COMMUNITY): Payer: BC Managed Care – PPO

## 2021-06-13 ENCOUNTER — Ambulatory Visit (HOSPITAL_BASED_OUTPATIENT_CLINIC_OR_DEPARTMENT_OTHER): Payer: BC Managed Care – PPO

## 2021-06-13 DIAGNOSIS — M4644 Discitis, unspecified, thoracic region: Secondary | ICD-10-CM | POA: Diagnosis not present

## 2021-06-13 DIAGNOSIS — K089 Disorder of teeth and supporting structures, unspecified: Secondary | ICD-10-CM | POA: Diagnosis not present

## 2021-06-13 HISTORY — PX: IR FLUORO GUIDED NEEDLE PLC ASPIRATION/INJECTION LOC: IMG2395

## 2021-06-13 LAB — URINE CULTURE: Culture: NO GROWTH

## 2021-06-13 IMAGING — XA IR FLUORO GUIDE NDL PLMT / BX
12 series · 12 of 12 positions shown · non-contrast
Comparison: none

INDICATION: T8-T9 osteomyelitis discitis

[Series 1: fl (-) angio · 1 of 1 slices shown (1 of 12)]
[im 1/1]
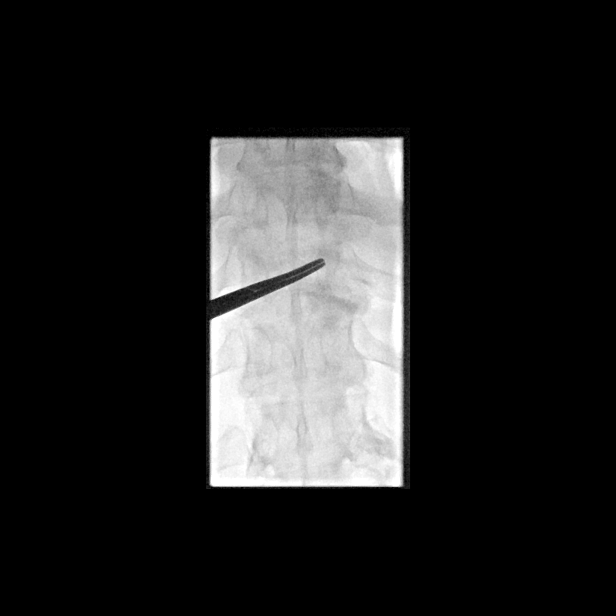

[Series 2: fl (-) angio · 1 of 1 slices shown (2 of 12)]
[im 1/1]
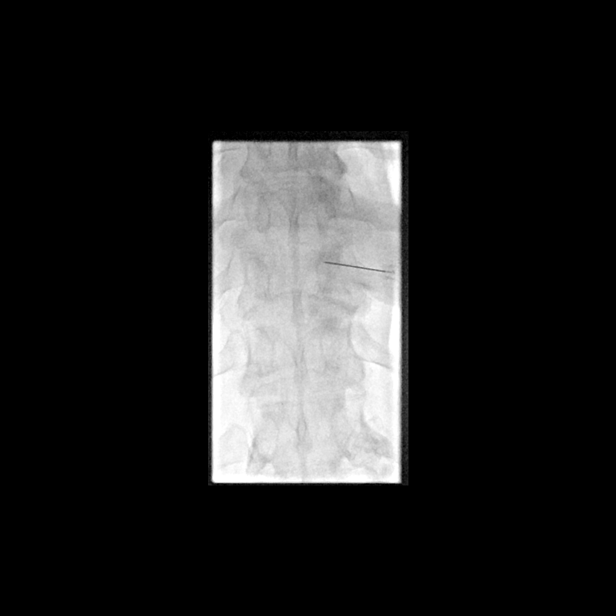

[Series 3: fl (-) angio · 1 of 1 slices shown (3 of 12)]
[im 1/1]
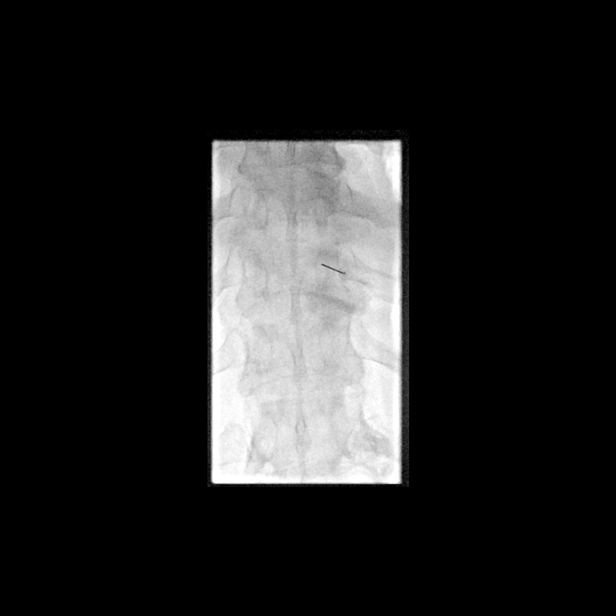

[Series 4: fl (-) angio · 1 of 1 slices shown (4 of 12)]
[im 1/1]
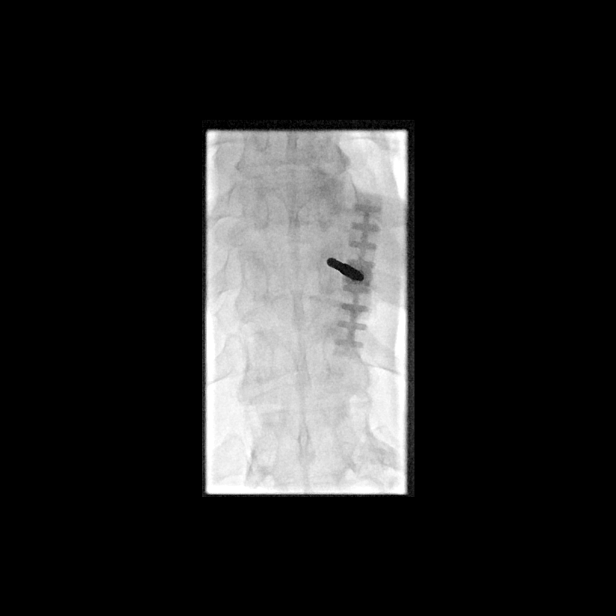

[Series 5: fl (-) angio · 1 of 1 slices shown (5 of 12)]
[im 1/1]
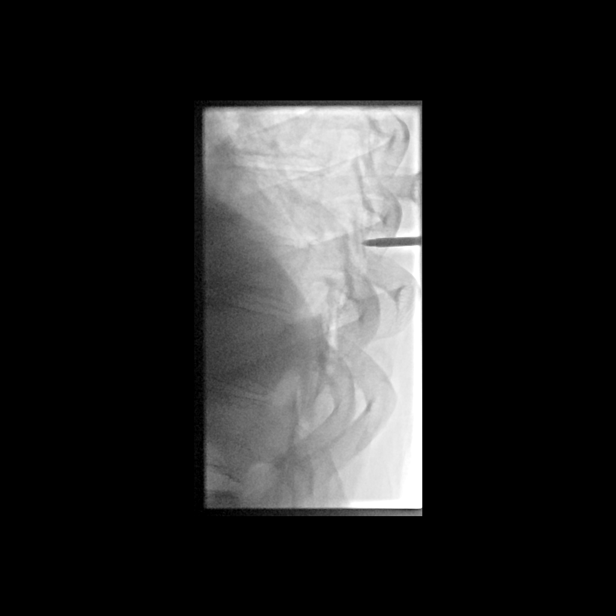

[Series 6: fl (-) angio · 1 of 1 slices shown (6 of 12)]
[im 1/1]
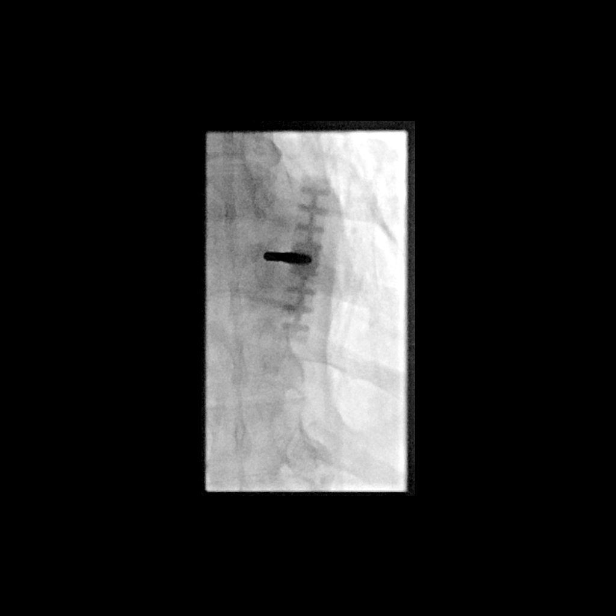

[Series 7: fl (-) angio · 1 of 1 slices shown (7 of 12)]
[im 1/1]
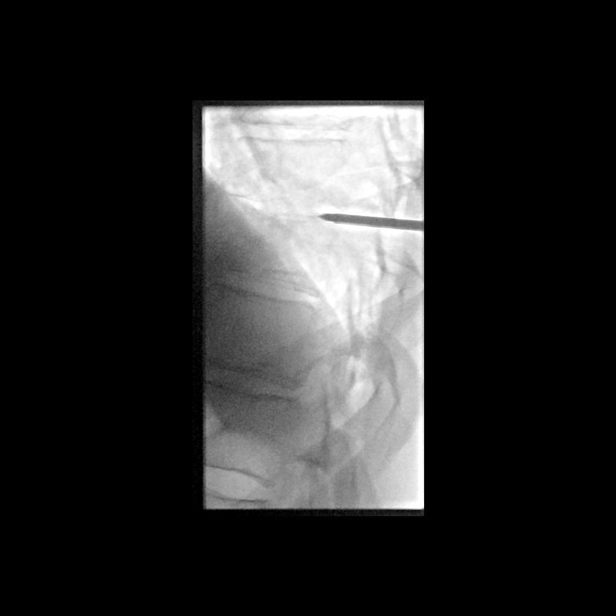

[Series 8: fl (-) angio · 1 of 1 slices shown (8 of 12)]
[im 1/1]
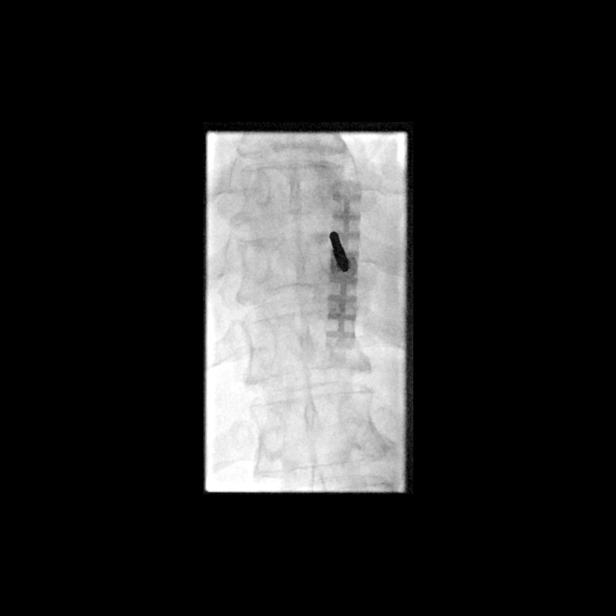

[Series 9: fl (-) angio · 1 of 1 slices shown (9 of 12)]
[im 1/1]
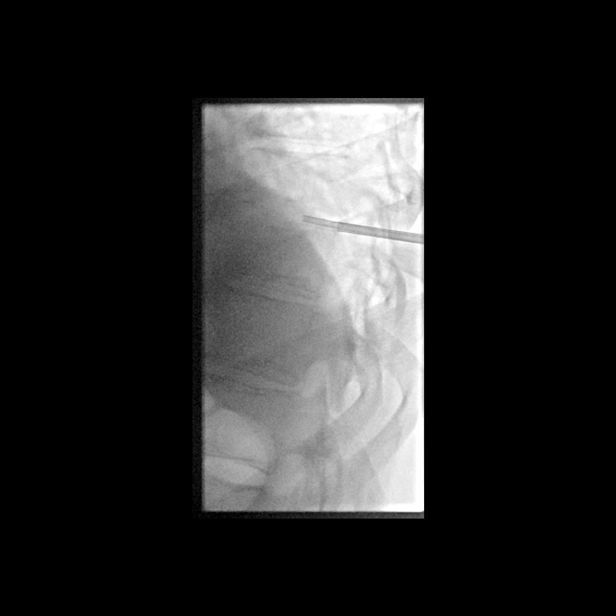

[Series 10: fl (-) angio · 1 of 1 slices shown (10 of 12)]
[im 1/1]
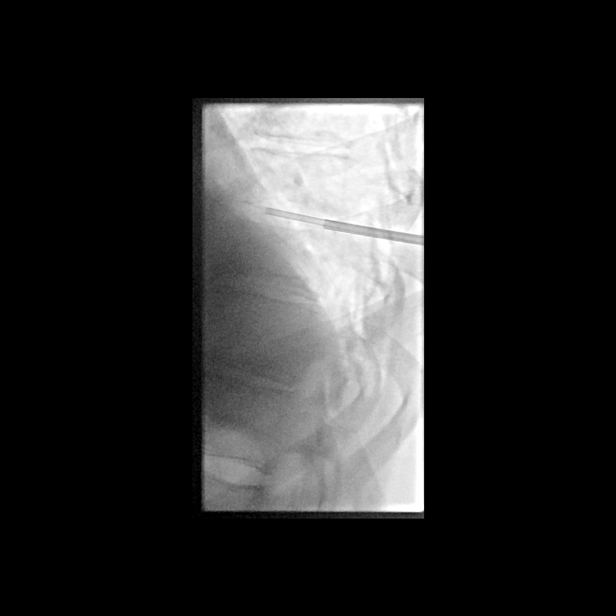

[Series 11: fl (-) angio · 1 of 1 slices shown (11 of 12)]
[im 1/1]
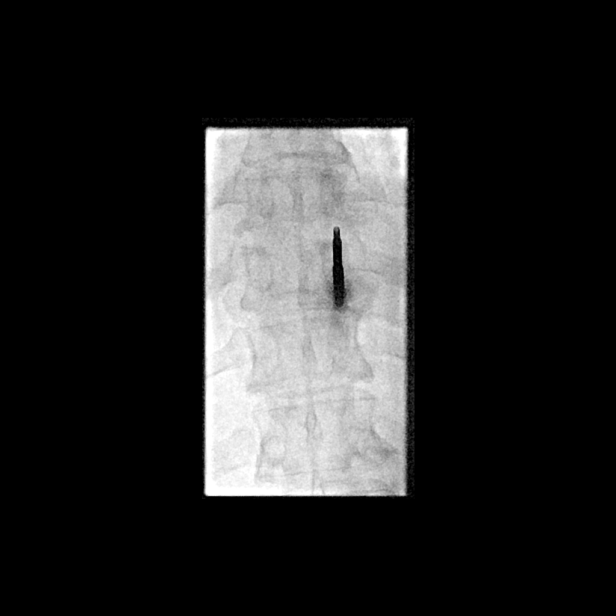

[Series 12: fl (-) angio · 1 of 1 slices shown (12 of 12)]
[im 1/1]
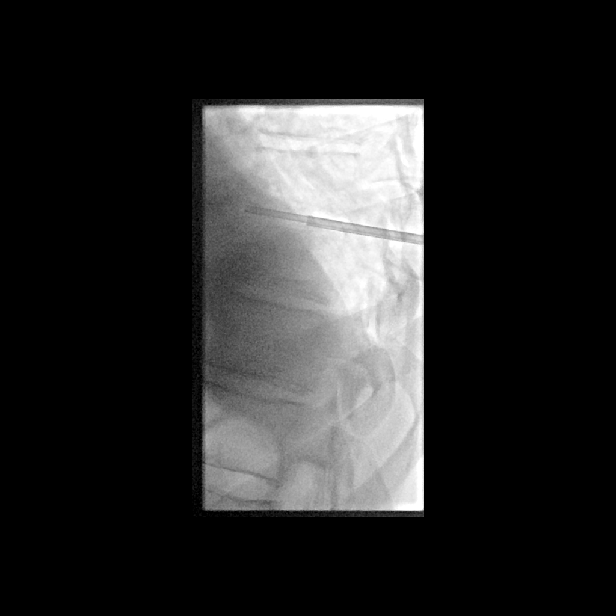

[12 of 12 positions shown; findings below may reference images not displayed]

EXAM:
1. Aspiration of the T8-T9 disc space using fluoroscopic guidance
2. Core biopsy of the T9 superior endplate using fluoroscopic
guidance

MEDICATIONS:
None.

ANESTHESIA/SEDATION:
Moderate (conscious) sedation was employed during this procedure. A
total of Versed 3 mg and Fentanyl 150 mcg was administered
intravenously.

Moderate Sedation Time: 30 minutes. The patient's level of
consciousness and vital signs were monitored continuously by
radiology nursing throughout the procedure under my direct
supervision.

FLUOROSCOPY TIME:  Fluoroscopy Time: 2.1 minutes (58 mGy)

COMPLICATIONS:
None immediate.

PROCEDURE:
Informed written consent was obtained from the patient after a
thorough discussion of the procedural risks, benefits and
alternatives. All questions were addressed. Maximal Sterile Barrier
Technique was utilized including caps, mask, sterile gowns, sterile
gloves, sterile drape, hand hygiene and skin antiseptic. A timeout
was performed prior to the initiation of the procedure.

The patient was positioned prone on the exam table. A right-sided
unipedicular access was planned. Skin entry site(s) were marked
overlying the T9 vertebral body using fluoroscopy. The overlying
skin was then prepped and draped in the standard sterile fashion.
Local analgesia was obtained with 1% lidocaine. Attention was turned
to the right side. Under fluoroscopic guidance, a 10 gauge
introducer needle was advanced towards the lateral margin of the
pedicle. Using multiple projections, the introducer needle was
advanced towards the posterior margin of the vertebral body via a
transpedicular approach.

Through this access needle, coaxial core biopsy of the T9 superior
endplate was performed. Scant solid material was obtained.
Additional aspirate of the T8-T9 disc space was performed via same
access needle. Core biopsy samples were submitted in saline for
further handling. Additional disc space aspirate was sent to
pathology for microbiology analysis. At the end of the procedure,
all needles were removed. Hemostasis was achieved at the access site
using manual pressure. A clean dressing was placed. No immediate
complication.
IMPRESSION: Successful T9 superior endplate biopsy and T8-T9 disc space aspirate
using fluoroscopic guidance.

## 2021-06-13 MED ORDER — FENTANYL CITRATE (PF) 100 MCG/2ML IJ SOLN
INTRAMUSCULAR | Status: AC
Start: 2021-06-13 — End: 2021-06-14
  Filled 2021-06-13: qty 2

## 2021-06-13 MED ORDER — MIDAZOLAM HCL 2 MG/2ML IJ SOLN
INTRAMUSCULAR | Status: AC | PRN
  Administered 2021-06-13 (×3): 1 mg via INTRAVENOUS

## 2021-06-13 MED ORDER — MIDAZOLAM HCL 2 MG/2ML IJ SOLN
INTRAMUSCULAR | Status: AC
Start: 2021-06-13 — End: 2021-06-14
  Filled 2021-06-13: qty 2

## 2021-06-13 MED ORDER — FENTANYL CITRATE (PF) 100 MCG/2ML IJ SOLN
INTRAMUSCULAR | Status: AC | PRN
  Administered 2021-06-13 (×3): 50 ug via INTRAVENOUS

## 2021-06-13 MED ORDER — ONDANSETRON HCL 4 MG/2ML IJ SOLN
INTRAMUSCULAR | Status: AC
  Filled 2021-06-13: qty 2

## 2021-06-13 MED ORDER — MIDAZOLAM HCL 2 MG/2ML IJ SOLN
INTRAMUSCULAR | Status: AC
  Filled 2021-06-13: qty 2

## 2021-06-13 MED ORDER — FENTANYL CITRATE (PF) 100 MCG/2ML IJ SOLN
INTRAMUSCULAR | Status: AC
  Filled 2021-06-13: qty 2

## 2021-06-13 MED ORDER — SODIUM CHLORIDE 0.9% FLUSH
10.0000 mL | Freq: Two times a day (BID) | INTRAVENOUS | Status: DC
  Administered 2021-06-13 – 2021-06-26 (×25): 10 mL
  Administered 2021-06-27: 20 mL
  Administered 2021-06-27 – 2021-06-29 (×5): 10 mL
  Administered 2021-06-30: 20 mL
  Administered 2021-07-01 (×2): 10 mL

## 2021-06-13 MED ORDER — ADULT MULTIVITAMIN W/MINERALS CH
1.0000 | ORAL_TABLET | Freq: Every day | ORAL | Status: DC
  Filled 2021-06-13: qty 1

## 2021-06-13 MED ORDER — SODIUM CHLORIDE 0.9% FLUSH
10.0000 mL | INTRAVENOUS | Status: DC | PRN
  Administered 2021-06-23: 10 mL

## 2021-06-13 MED ORDER — ENSURE ENLIVE PO LIQD
237.0000 mL | Freq: Two times a day (BID) | ORAL | Status: DC
  Administered 2021-06-16 – 2021-06-19 (×4): 237 mL via ORAL
  Filled 2021-06-13: qty 237

## 2021-06-13 MED ORDER — LIDOCAINE HCL 1 % IJ SOLN
INTRAMUSCULAR | Status: AC
  Administered 2021-06-13: 20 mL
  Filled 2021-06-13: qty 20

## 2021-06-13 NOTE — Progress Notes (Signed)
At this time pt is refusing turns d/t pain. PRN Morphine given but pt still refuse to be turned.  ?

## 2021-06-13 NOTE — Progress Notes (Signed)
?   ? ?[facility] ? ?Date of Admission:  06/11/2021    ?       ?Reason for visit: Follow up on discitis/OM ? ?Current antibiotics: ?Ceftriaxone 4/1-present ? ?Prior to hospitalization: ?Cefazolin 3/21-3/31 ? ?ASSESSMENT:   ? ?61 y.o. male admitted with: ? ?T8-T9 osteomyelitis and paraspinal phlegmon: Previous blood and aspirate cultures in March notable for pansensitive E. coli.  Patient discharged on cefazolin which was changed to ceftriaxone on 4/1 due to nausea/vomiting attributed to cefazolin.  Patient had a CT scan of the abdomen due to elevated WBC and CRP on 4/13.  This was obtained to ensure there is no intra-abdominal infection given his GI complaints and elevated lab markers.  This CT scan was reassuring from an intra-abdominal perspective however noted worsening discitis and osteomyelitis with increased bony erosion.  A repeat MRI with contrast was subsequently obtained which also noted substantial progression of his discitis/osteomyelitis with eroded endplates.  There was also increased paraspinal phlegmon with confluent epidural and dural thickening and inflammation.  Evaluated by neurosurgery yesterday.  They recommended continued medical management without surgical intervention at this time as he has no severe stenosis on MRI causing cord compression and no frank instability.  IR has consulted and is planning for repeat disc aspiration today. ?Nausea/vomiting: Appears improved at this time since switching to ceftriaxone. ?Poor dentition: Multiple dental caries noted on imaging ? ?RECOMMENDATIONS:   ? ?Continue ceftriaxone ?Appreciate neurosurgery evaluation.  No plans for surgery at this time. ?Appreciate IR planning for disc space aspiration today ?We will follow cultures to see if this requires any adjustment of antibiotics ?His ESR on 4/12 is improved from 3/17 (67 down to 40).  His CRP also 4/12 is improved from 4 weeks ago (13.7 down to 10.2).  His very high CRP of  110.2 drawn on 4/5 appears to have been done on a different platform based on varying upper limit of normal levels so I am not sure how to correlate that reading with the other two and query whether it is a spurious measurement.  ?Dr Candiss Norse is here this weekend and will be back on Monday. ? ? ? ?Principal Problem: ?  Thoracic discitis ?Active Problems: ?  Essential hypertension ?  Class 1 obesity due to excess calories with body mass index (BMI) of 34.0 to 34.9 in adult ?  Oral thrush ?  Poor dentition ? ? ? ?MEDICATIONS:   ? ?Scheduled Meds: ?? Chlorhexidine Gluconate Cloth  6 each Topical Daily  ?? docusate sodium  100 mg Oral BID  ?? nystatin  5 mL Oral QID  ?? sodium chloride flush  10-40 mL Intracatheter Q12H  ?? sodium chloride flush  3 mL Intravenous Q12H  ?? triamterene-hydrochlorothiazide  1 tablet Oral Daily  ? ?Continuous Infusions: ?? cefTRIAXone (ROCEPHIN)  IV Stopped (06/12/21 1056)  ?? lactated ringers 75 mL/hr at 06/13/21 0112  ? ?PRN Meds:.acetaminophen **OR** acetaminophen, bisacodyl, hydrALAZINE, morphine injection, ondansetron **OR** ondansetron (ZOFRAN) IV, oxyCODONE, polyethylene glycol, sodium chloride flush ? ?SUBJECTIVE:  ? ?24 hour events:  ?No acute events ? ?No fevers or chills.  Having trouble taking his blood pressure medicine with water and is hoping to drink it with Gatorade.  Seen by IR this morning. ? ? ? ?Review of Systems  ?All other systems reviewed and are negative. ? ?  ?OBJECTIVE:  ? ?Blood pressure (!) 143/89, pulse (!) 101, temperature 98.2 ?F (36.8 ?C), temperature source Oral, resp. rate 18, height $RemoveBe'5\' 11"'Ongljkool$  (  1.803 m), weight 113.4 kg, SpO2 95 %. ?Body mass index is 34.87 kg/m?. ? ?Physical Exam ?Constitutional:   ?   General: He is not in acute distress. ?   Appearance: Normal appearance.  ?HENT:  ?   Head: Normocephalic and atraumatic.  ?   Mouth/Throat:  ?   Comments: Poor dentition ?Eyes:  ?   Extraocular Movements: Extraocular movements intact.  ?   Conjunctiva/sclera:  Conjunctivae normal.  ?Pulmonary:  ?   Effort: Pulmonary effort is normal. No respiratory distress.  ?Abdominal:  ?   General: There is no distension.  ?   Palpations: Abdomen is soft.  ?   Tenderness: There is no abdominal tenderness.  ?Musculoskeletal:     ?   General: Normal range of motion.  ?   Cervical back: Normal range of motion and neck supple.  ?Skin: ?   General: Skin is warm and dry.  ?Neurological:  ?   General: No focal deficit present.  ?   Mental Status: He is alert and oriented to person, place, and time.  ?Psychiatric:     ?   Mood and Affect: Mood normal.     ?   Behavior: Behavior normal.  ? ? ? ?Lab Results: ?Lab Results  ?Component Value Date  ? WBC 12.3 (H) 06/11/2021  ? HGB 16.2 06/11/2021  ? HCT 49.4 06/11/2021  ? MCV 89.0 06/11/2021  ? PLT 309 06/11/2021  ?  ?Lab Results  ?Component Value Date  ? NA 133 (L) 06/11/2021  ? K 3.2 (L) 06/11/2021  ? CO2 23 06/11/2021  ? GLUCOSE 178 (H) 06/11/2021  ? BUN 12 06/11/2021  ? CREATININE 0.99 06/11/2021  ? CALCIUM 9.3 06/11/2021  ? GFRNONAA >60 06/11/2021  ?  ?Lab Results  ?Component Value Date  ? ALT 109 (H) 06/11/2021  ? AST 51 (H) 06/11/2021  ? ALKPHOS 108 06/11/2021  ? BILITOT 0.8 06/11/2021  ? ? ?   ?Component Value Date/Time  ? CRP 10.2 (H) 06/11/2021 2257  ? ? ?   ?Component Value Date/Time  ? ESRSEDRATE 40 (H) 06/11/2021 2257  ? ?  ?I have reviewed the micro and lab results in Epic. ? ?Imaging: ?DG Orthopantogram ? ?Result Date: 06/12/2021 ?CLINICAL DATA:  [id number] Dental caries. EXAM: ORTHOPANTOGRAM/PANORAMIC COMPARISON:  None. FINDINGS: Multiple missing teeth. Technique limits evaluation of the left posterior maxillary molar. The posterior-most left mandibular molar is fractured with periapical lucency. Possible dental caries of the left canine and left premolars. IMPRESSION: Multiple missing teeth with dental caries and periapical lucencies of the remaining teeth, described above. Electronically Signed   By: Margaretha Sheffield M.D.   On:  06/12/2021 19:00  ? ?MR THORACIC SPINE W CONTRAST ? ?Result Date: 06/12/2021 ?CLINICAL DATA:  61 year old male with increasing back pain. T8-T9 discitis osteomyelitis diagnosed in March. EXAM: MRI THORACIC SPINE WITH CONTRAST TECHNIQUE: Multiplanar, multisequence MR imaging of the thoracic spine was performed following the administration of intravenous contrast. COMPARISON:  CT Abdomen and Pelvis 0142 hours today. Thoracic spine MRI 05/16/2021. FINDINGS: Limited cervical spine imaging: Cervical spine degeneration C4-C5 through C6-C7. Thoracic spine segmentation: Appears to be normal, same numbering system as last month. Alignment: Stable thoracic kyphosis from last month. No spondylolisthesis. Vertebrae: Progressed and severe marrow edema now throughout the T8 and T9 vertebral bodies with early bilateral pedicle involvement. Moderately eroded endplates, especially anteriorly, since the MRI last month. Interval 25% loss of T8 vertebral body height. Eroded T9 anterior superior endplate. Increased abnormal heterogeneous fluid signal  throughout the abnormal disc space. And there is new abnormal fluid in the bilateral T8-T9 facets greater on the right. Progressed and confluent paraspinal phlegmon at that level eccentric to the right (series 18, image 26). And progressed abnormal circumferential dural thickening and enhancement now extending from the mid T7 through the mid T10 levels. See spinal canal details there below. Cord:  Normal thoracic spinal cord above T8. Mild to moderate cord compression now at T8-T9. But no cord edema. Abnormal dural thickening and enhancement there as stated above. No enhancement within the substance of the cord. Below T9 cord signal and morphology appears normal. Conus medullaris is partially visible at T12-L1. Paraspinal and other soft tissues: Confluent paraspinal phlegmon at T8-T9 is mostly anterior and lateral. There is no drainable paraspinal fluid collection. Small layering pleural  effusions. Disc levels: T1-T2 through T6-T7: Negative except for small right paracentral disc protrusion at T5-T6. T7-T8 through T9-T10: Abnormal moderate dural thickening and enhancement with epidural inflammation.

## 2021-06-13 NOTE — H&P (Addendum)
? ?Chief Complaint: ?Patient was seen in consultation today for disc aspiration of T8-T9 ? ?Referring Physician(s): ?Dr. Ophelia CharterYates ? ?Supervising Physician: Baldemar Lenise Macedo Rodrigues, [place] ? ?[facility] ? ?History of Present Illness: ?Michael [name] is a 61 y.o. male w/ recent dental infection [facility] with 2 week history of back pain.  He was found to have T8-T9 osteomyelitis.  He underwent disc aspiration 3/19 by Dr. Elby ShowersSuttle which showed presence of E Coli.  He was placed on IV Ancef via right arm PICC and discharged home, however returned with increased pain.   ?Afebrile, VSS, WBC 12.3 ? ?MR THORACIC SPINE shows: ?1. Substantial progression of T8-T9 discitis osteomyelitis since ?05/16/2021. ?Eroded endplates with 25% loss of T8 vertebral body height since ?that time. Probable septic facet joints now. Bulky increased ?paraspinal phlegmon, and confluent epidural and dural thickening and ?inflammation. ? ?R again consulted for aspiration Procedure reviewed and approved by Dr. Tommie Samsde Macedo Rodrigues.   ? ?Patient assessed at bedside alongside family.  He is understanding of the goals of the procedure and is agreeable to proceed.  ?He has been NPO today.  ? ?Allergies: ?Patient has no known allergies. ? ?Medications: ?Prior to Admission medications   ?Medication Sig Start Date End Date Taking? Authorizing Provider  ?Aspirin-Salicylamide-Caffeine (BC HEADACHE POWDER PO) Take by mouth.   Yes [provider]  ?calcium carbonate (TUMS - DOSED IN MG ELEMENTAL CALCIUM) 500 MG chewable tablet Chew 1 tablet by mouth daily.   Yes [provider]  ?esomeprazole (NEXIUM) 20 MG capsule Take 20 mg by mouth daily at 12 noon.   Yes [provider]  ?naproxen sodium (ALEVE) 220 MG tablet Take 220 mg by mouth daily as needed (pain).   Yes [provider]  ?ondansetron (ZOFRAN) 4 MG tablet Take by mouth. 05/06/21  Yes [provider]  ?triamterene-hydrochlorothiazide  (MAXZIDE-25) 37.5-25 MG tablet Take 1 tablet by mouth daily. 04/24/21  Yes [provider]  ?cyclobenzaprine (FLEXERIL) 10 MG tablet Take 1 tablet (10 mg total) by mouth 3 (three) times daily as needed for muscle spasms. ?Patient not taking: Reported on 05/16/2021 05/08/21   Vanetta MuldersZackowski, Scott, MD  ?HYDROcodone-acetaminophen (NORCO/VICODIN) 5-325 MG tablet Take 2 tablets by mouth every 4 (four) hours as needed. ?Patient not taking: Reported on 05/16/2021 05/08/21   Vanetta MuldersZackowski, Scott, MD  ?ondansetron (ZOFRAN-ODT) 4 MG disintegrating tablet Take 1 tablet (4 mg total) by mouth every 8 (eight) hours as needed for nausea or vomiting. ?Patient not taking: Reported on 05/16/2021 05/08/21   Vanetta MuldersZackowski, Scott, MD  ?  ? ?History reviewed. No pertinent family history. ? ?Social History  ? ?Socioeconomic History  ? Marital status: Married  ?  Spouse name: Not on file  ? Number of children: Not on file  ? Years of education: Not on file  ? Highest education level: Not on file  ?Occupational History  ? Occupation: truck Hospital doctordriver  ?Tobacco Use  ? Smoking status: Never  ? Smokeless tobacco: Never  ?Vaping Use  ? Vaping Use: Never used  ?Substance and Sexual Activity  ? Alcohol use: Not Currently  ? Drug use: Never  ? Sexual activity: Not Currently  ?Other Topics Concern  ? Not on file  ?Social History Narrative  ? Not on file  ? ?Social Determinants of Health  ? ?Financial Resource Strain: Not on file  ?Food Insecurity: Not on file  ?Transportation Needs: Not on file  ?Physical Activity: Not on file  ?Stress: Not  on file  ?Social Connections: Not on file  ? ? ?Review of Systems: A 12 point ROS discussed and pertinent positives are indicated in the HPI above.  All other systems are negative. ? ?Review of Systems  ?Constitutional:  Positive for appetite change. Negative for chills and fever.  ?Eyes:  Negative for visual disturbance.  ?Respiratory:  Negative for cough and shortness of breath.   ?Cardiovascular:  Negative for chest pain and  leg swelling.  ?Gastrointestinal:  Negative for abdominal pain, nausea and vomiting.  ?Musculoskeletal:  Positive for back pain.  ?Neurological:  Negative for dizziness and headaches.  ?Psychiatric/Behavioral:  Negative for behavioral problems and confusion.   ? ?Vital Signs: ?BP (!) 141/88 (BP Location: Left Arm)   Pulse 94   Temp 98.2 ?F (36.8 ?C) (Oral)   Resp 16   Ht 5\' 11"  (1.803 m)   Wt 250 lb (113.4 kg)   SpO2 93%   BMI 34.87 kg/m?  ? ?Physical Exam ?Constitutional:   ?   Appearance: Normal appearance. He is not ill-appearing.  ?HENT:  ?   Head: Normocephalic and atraumatic.  ?   Mouth/Throat:  ?   Mouth: Mucous membranes are dry.  ?   Pharynx: Oropharynx is clear.  ?Eyes:  ?   Extraocular Movements: Extraocular movements intact.  ?   Pupils: Pupils are equal, round, and reactive to light.  ?Cardiovascular:  ?   Rate and Rhythm: Regular rhythm. Tachycardia present.  ?   Pulses: Normal pulses.  ?   Heart sounds: Normal heart sounds. No murmur heard. ?Pulmonary:  ?   Effort: Pulmonary effort is normal. No respiratory distress.  ?   Breath sounds: Normal breath sounds.  ?Abdominal:  ?   General: Bowel sounds are normal. There is no distension.  ?   Tenderness: There is no abdominal tenderness. There is no guarding.  ?Musculoskeletal:     ?   General: Tenderness (with movement.) present.  ?   Right lower leg: No edema.  ?   Left lower leg: No edema.  ?   Comments: No tenderness noted to thoracic spine to palpation  ?Skin: ?   General: Skin is warm and dry.  ?Neurological:  ?   General: No focal deficit present.  ?   Mental Status: He is alert and oriented to person, place, and time. Mental status is at baseline.  ?Psychiatric:     ?   Mood and Affect: Mood normal.     ?   Behavior: Behavior normal.     ?   Thought Content: Thought content normal.     ?   Judgment: Judgment normal.  ? ? ?Imaging: ?DG Orthopantogram ? ?Result Date: 06/12/2021 ?CLINICAL DATA:  06/14/2021 Dental caries. EXAM:  ORTHOPANTOGRAM/PANORAMIC COMPARISON:  None. FINDINGS: Multiple missing teeth. Technique limits evaluation of the left posterior maxillary molar. The posterior-most left mandibular molar is fractured with periapical lucency. Possible dental caries of the left canine and left premolars. IMPRESSION: Multiple missing teeth with dental caries and periapical lucencies of the remaining teeth, described above. Electronically Signed   By: [id number] M.D.   On: 06/12/2021 19:00  ? ?DG Abd 1 View ? ?Result Date: 06/06/2021 ?CLINICAL DATA:  Nausea and vomiting.  Rule out obstruction or ileus EXAM: ABDOMEN - 1 VIEW COMPARISON:  05/20/2021 abdominal CT FINDINGS: Layering right upper quadrant calcifications from gallstones. No evidence of bowel obstruction. No abnormal stool retention. Calcification in the pelvis attributed to phleboliths. Rounded benign calcification over the  left lower quadrant, likely old epiploic appendagitis. Minor scarring or atelectasis at the right base, similar to recent abdominal CT. IMPRESSION: 1. Normal bowel gas pattern.  No abnormal stool retention. 2. Cholelithiasis. Electronically Signed   By: Tiburcio Pea M.D.   On: 06/06/2021 10:21  ? ?DG Abd 1 View ? ?Result Date: 05/20/2021 ?CLINICAL DATA:  No bowel movement in 2 weeks. EXAM: ABDOMEN - 1 VIEW COMPARISON:  CT abdomen pelvis dated May 16, 2021. FINDINGS: The bowel gas pattern is normal. Overall normal colonic stool burden with mild amount of stool in the right colon. No radio-opaque calculi or other significant radiographic abnormality are seen. IMPRESSION: 1. Negative. Electronically Signed   By: Obie Dredge M.D.   On: 05/20/2021 14:28  ? ?CT Angio Chest PE W and/or Wo Contrast ? ?Result Date: 05/16/2021 ?CLINICAL DATA:  Intermittent mid to low back pain for the past week. EXAM: CT ANGIOGRAPHY CHEST CT ABDOMEN AND PELVIS WITH CONTRAST TECHNIQUE: Multidetector CT imaging of the chest was performed using the standard protocol during  bolus administration of intravenous contrast. Multiplanar CT image reconstructions and MIPs were obtained to evaluate the vascular anatomy. Multidetector CT imaging of the abdomen and pelvis was performed using the standard protocol

## 2021-06-13 NOTE — Procedures (Signed)
Interventional Radiology Procedure Note ? ?Date of Procedure: 06/13/2021  ?Procedure: T8/T9 disc space aspirate, T9 superior endplate biopsy  ? ?Findings:  ?1. Successful T8/T9 disc space aspirate with 3ml bloody material aspirated  ?2. Successful T9 superior endplate biopsy x2 passes with minimal bony material able to be sampled   ? ?Complications: No immediate complications noted.  ? ?Estimated Blood Loss: minimal ? ?Follow-up and Recommendations: ?1. Bedrest 1 hour   ? ? ?Albin Felling, MD  ?Vascular & Interventional Radiology  ?06/13/2021 4:03 PM ? ? ? ?

## 2021-06-13 NOTE — Progress Notes (Addendum)
?PROGRESS NOTE ? ? ? ?Michael [name]  BJY:[id number] DOB: 01/14/1961 DOA: 06/11/2021 ?PCP: Michael [name], Michael [name], Michael [name]  ? ? ? ?Brief Narrative:  ? ?Michael [name] is a 61 y.o. male with medical history significant of HTN and obesity presenting with back pain.  He was previously hospitalized from 3/17-22 with T8-9 discitis and E coli bacteremia following recent dental infection.   His antibiotics are scheduled to continue through 4/28 but he has had persistent n/v.   He is continuing to have severe back pain.  He has been to [facility] twice for pain and n/v ? ?Reports having dental pain, suppose to see a dentist, reports mouth wash helped ? ?Subjective: ? ?Patient is seen prior to IR aspiration ?He denies pain, report poor appetite due to teeth issues ?Mother and wife at bedside ? ?Assessment & Plan: ? Principal Problem: ?  Thoracic discitis ?Active Problems: ?  Oral thrush ?  Poor dentition ?  Essential hypertension ?  Class 1 obesity due to excess calories with body mass index (BMI) of 34.0 to 34.9 in adult ? ? ? ?Thoracic osteomyelitis ?Previously admitted with thoracic discitis/osteo ?-Spinal aspirate cultures positive for pansensitive E coli ?-He was initially treated with Ancef but had too much n/v to tolerate and so was changed to Rocephin ?-Back pain got worse return to the hospital ?- neurosurgery recommend IR to re aspirate for source control , no plan for surgical intervention  ?-ID following, antibiotic management per ID ?-Blood culture no growth ? ?Pain control ?Report takes oxycodone once a day at home ?Has required several doses of IV narcotic here due to increased pain ? ?Reported no BM for several days ?Does not want have stool softener, think due to poor appetite ? ?N/V ?CT ab/pel, personally reviewed ,no bowel obstruction, no increased stool burden , + small gallstones ?Denies Abdo pain ?Monitor LFT ?Supportive treatment ?If does not improve, will ask GI to see, as he has been having n/v and was seen in the ED  several times for this, the first time was on 3/9 prior to abx initiation, atypical presentation of gallstones? Vs esophageal candidiasis? As he has oral thrush ?Reports previous health, works full time as a Naval architecttruck driver and travels alot ? ? ? ?HTN ?Continue home medication ? ?Dental pain: ?DG orthpantogram: ?Multiple missing teeth with dental caries and periapical lucencies ?of the remaining teeth, described above. ?F/u with dentist  ? ?Oral thrush ?Topical treatment  ?Reports mouth wash helped ? ?obesity: Body mass index is 34.87 kg/m?Marland Kitchen.Marland Kitchen. ?Seen by dietician.  I agree with the assessment and plan as outlined below: ?Nutrition Status: ?Nutrition Problem: Inadequate oral intake ?Etiology: vomiting, acute illness, mouth pain, poor appetite ?Signs/Symptoms: per patient/family report ?Interventions: Ensure Enlive (each supplement provides 350kcal and 20 grams of protein), MVI ? ?. ? ? ? ?I have Reviewed nursing notes, Vitals, pain scores, I/o'[name], Lab results and  imaging results since pt'[name] last encounter, details please see discussion above  ?I ordered the following labs:  ?Unresulted Labs (From admission, onward)  ? ?  Start     Ordered  ? 06/14/21 0500  CBC with Differential/Platelet  Tomorrow morning,   R       ?Question:  Specimen collection method  Answer:  Lab=Lab collect  ? 06/13/21 1234  ? 06/14/21 0500  Comprehensive metabolic panel  Tomorrow morning,   R       ?Question:  Specimen collection method  Answer:  Lab=Lab collect  ? 06/13/21 1234  ?  06/14/21 0500  Magnesium  Tomorrow morning,   R       ?Question:  Specimen collection method  Answer:  Lab=Lab collect  ? 06/13/21 1234  ? 06/13/21 1607  Aerobic/Anaerobic Culture w Gram Stain (surgical/deep wound)  Once,   R       ?Comments: T8/T9 disc space aspirate (submitted in x2 syringes) T9 superior endplate biopsy submitted in saline container (please perform pathology analysis of bone fragments if present) ?  ? 06/13/21 1608  ? ?  ?  ? ?  ? ? ? ?DVT prophylaxis:  SCDs Start: 06/12/21 1059 ? ? ?Code Status:   Code Status: Full Code ? ?Family Communication: Wife and mother at bedside ?Disposition:  ? ?Status is: Inpatient ? ?Dispo: The patient is from: Home ?             Anticipated d/c is to: Home ?             Anticipated d/c date is: TBD ? ?Antimicrobials:   ? ?Anti-infectives (From admission, onward)  ? ? Start     Dose/Rate Route Frequency Ordered Stop  ? 06/12/21 1000  cefTRIAXone (ROCEPHIN) 2 g in sodium chloride 0.9 % 100 mL IVPB       ? 2 g ?200 mL/hr over 30 Minutes Intravenous Every 24 hours 06/12/21 0901    ? ?  ? ? ? ? ? ?Objective: ?Vitals:  ? 06/13/21 1550 06/13/21 1555 06/13/21 1615 06/13/21 1634  ?BP: (!) 132/91 (!) 120/91 (!) 142/99   ?Pulse: 93 92 95 90  ?Resp: 10 12 14    ?Temp:      ?TempSrc:      ?SpO2: 100% 100% 98%   ?Weight:      ?Height:      ? ? ?Intake/Output Summary (Last 24 hours) at 06/13/2021 1731 ?Last data filed at 06/13/2021 0300 ?Gross per 24 hour  ?Intake 1236.7 ml  ?Output --  ?Net 1236.7 ml  ? ?Filed Weights  ? 06/11/21 2219  ?Weight: 113.4 kg  ? ? ?Examination: ? ?General exam: alert, awake, appear frustrated, PICC line in right arm ?Respiratory system: Clear to auscultation. Respiratory effort normal. ?Cardiovascular system:  RRR.  ?Gastrointestinal system: Abdomen is nondistended, soft and nontender.  Normal bowel sounds heard. ?Central nervous system: Alert and oriented. No focal neurological deficits. ?Extremities:  no edema ?Skin: No rashes, lesions or ulcers ?Psychiatry: Appear frustrated ? ? ? ?Data Reviewed: I have personally reviewed  labs and visualized  imaging studies since the last encounter and formulate the plan  ? ? ? ? ? ? ?Scheduled Meds: ? Chlorhexidine Gluconate Cloth  6 each Topical Daily  ? docusate sodium  100 mg Oral BID  ? feeding supplement  237 mL Oral BID BM  ? fentaNYL      ? fentaNYL      ? midazolam      ? midazolam      ? multivitamin with minerals  1 tablet Oral Daily  ? nystatin  5 mL Oral QID  ? sodium  chloride flush  10-40 mL Intracatheter Q12H  ? sodium chloride flush  3 mL Intravenous Q12H  ? triamterene-hydrochlorothiazide  1 tablet Oral Daily  ? ?Continuous Infusions: ? cefTRIAXone (ROCEPHIN)  IV 2 g (06/13/21 1023)  ? lactated ringers 75 mL/hr at 06/13/21 0112  ? ? ? LOS: 1 day  ? ? ?06/15/21, MD PhD FACP ?[facility] ? ?Available via Epic secure chat 7am-7pm for nonurgent issues ?Please page for  urgent issues ?To page the attending provider between 7A-7P or the covering provider during after hours 7P-7A, please log into the web site [email or web address] and access using universal [place] password for that web site. If you do not have the password, please call the hospital operator. ? ? ? ?06/13/2021, 5:31 PM  ? ? ?

## 2021-06-13 NOTE — Progress Notes (Signed)
Initial Nutrition Assessment ? ?DOCUMENTATION CODES:  ? ?Obesity unspecified ? ?INTERVENTION:  ?- Once diet resumes, recommend regular diet ? ?- Ensure Enlive po BID, each supplement provides 350 kcal and 20 grams of protein. ? ?- MVI with minerals daily ? ?NUTRITION DIAGNOSIS:  ? ?Inadequate oral intake related to vomiting, acute illness, mouth pain, poor appetite as evidenced by per patient/family report. ? ?GOAL:  ? ?Patient will meet greater than or equal to 90% of their needs ? ?MONITOR:  ? ?PO intake, Supplement acceptance, Diet advancement, Labs, Weight trends ? ?REASON FOR ASSESSMENT:  ? ?Consult ?Other (Comment) (nutritional goals) ? ?ASSESSMENT:  ? ?Pt admitted from home with back pain secondary to thoracic discitis. PMH significant for HTN and obesity.  Prior hospitalization from 3/17-3/22 with T8-9 discitis and E coli bacteremia following recent dental infection. ?  ?Per Neuro, no plans for surgical interventions at this time. IR planning for repeat disc aspiration today.  ? ?Pt with mom and wife at bedside assisting to provide nutrition and weight history. They state that he was eating really well prior to this last month. Within the last month he has had lots of emesis which they suspect led to thrush. He has had difficulty tolerating food but has had improvement with the addition of mouthwash for thrush during admission. His wife states that he had 2 bowls of chicken broth yesterday which is the most he has had within the last month. He also enjoys popsicles as this has been soothing on his mouth and throat. We discussed addition of Ensure to aid in caloric and protein intake. Pt was hesitant but agreed to try Strawberry Ensure once diet advances.  ? ?Pt states his usual weight is ~300 lbs and endorses some weight loss but unsure how much. Reviewed weight history. Pt's weight noted to be -35.8 kg per documented weights from 3/30-4/12. Unsure if admit weight is a stated or an actual weight. Suspect  true weight loss present given pt report of poor PO intake.  ? ?Pt is at high risk for malnutrition given poor appetite and inadequate PO intake within the last month. Will continue to reassess as appropriate throughout admission. ? ?Noted nursing documentation of mild generalized edema ? ?Medications: colace, nystatin ?IV drips: rocephin, LR @75ml /hr ? ?Labs: sodium 133, potassium 3.2, AST 51, ALT 109 ? ?NUTRITION - FOCUSED PHYSICAL EXAM: ? ?Flowsheet Row Most Recent Value  ?Orbital Region Mild depletion  ?Upper Arm Region No depletion  ?Thoracic and Lumbar Region No depletion  ?Buccal Region Mild depletion  ?Temple Region No depletion  ?Clavicle Bone Region No depletion  ?Clavicle and Acromion Bone Region No depletion  ?Scapular Bone Region No depletion  ?Dorsal Hand No depletion  ?Patellar Region No depletion  ?Anterior Thigh Region No depletion  ?Posterior Calf Region No depletion  ?Edema (RD Assessment) Mild  [generalized]  ?Hair Reviewed  ?Eyes Reviewed  ?Mouth Reviewed  ?Skin Reviewed  ?Nails Reviewed  ? ?  ? ? ?Diet Order:   ?Diet Order   ? ?       ?  Diet NPO time specified Except for: Sips with Meds  Diet effective now       ?  ? ?  ?  ? ?  ? ? ?EDUCATION NEEDS:  ? ?Education needs have been addressed ? ?Skin:  Skin Assessment: Reviewed RN Assessment ? ?Last BM:  PTA ? ?Height:  ? ?Ht Readings from Last 1 Encounters:  ?06/11/21 5\' 11"  (1.803 m)  ? ? ?Weight:  ? ?  Wt Readings from Last 1 Encounters:  ?06/11/21 113.4 kg  ? ? ?Ideal Body Weight:  78.2 kg ? ?BMI:  Body mass index is 34.87 kg/m?. ? ?Estimated Nutritional Needs:  ? ?Kcal:  2000-2200 ? ?Protein:  100-115g ? ?Fluid:  >/=2L ? ?Drusilla Kanner, RDN, LDN ?Clinical Nutrition ?

## 2021-06-14 DIAGNOSIS — M4644 Discitis, unspecified, thoracic region: Secondary | ICD-10-CM | POA: Diagnosis not present

## 2021-06-14 DIAGNOSIS — R112 Nausea with vomiting, unspecified: Secondary | ICD-10-CM | POA: Diagnosis not present

## 2021-06-14 DIAGNOSIS — K089 Disorder of teeth and supporting structures, unspecified: Secondary | ICD-10-CM | POA: Diagnosis not present

## 2021-06-14 LAB — CBC WITH DIFFERENTIAL/PLATELET
Abs Immature Granulocytes: 0.31 10*3/uL — ABNORMAL HIGH (ref 0.00–0.07)
Basophils Absolute: 0.1 10*3/uL (ref 0.0–0.1)
Basophils Relative: 1 %
Eosinophils Absolute: 0.2 10*3/uL (ref 0.0–0.5)
Eosinophils Relative: 3 %
HCT: 40.7 % (ref 39.0–52.0)
Hemoglobin: 13.2 g/dL (ref 13.0–17.0)
Immature Granulocytes: 3 %
Lymphocytes Relative: 17 %
Lymphs Abs: 1.6 10*3/uL (ref 0.7–4.0)
MCH: 28.9 pg (ref 26.0–34.0)
MCHC: 32.4 g/dL (ref 30.0–36.0)
MCV: 89.1 fL (ref 80.0–100.0)
Monocytes Absolute: 1.1 10*3/uL — ABNORMAL HIGH (ref 0.1–1.0)
Monocytes Relative: 12 %
Neutro Abs: 6.1 10*3/uL (ref 1.7–7.7)
Neutrophils Relative %: 64 %
Platelets: 268 10*3/uL (ref 150–400)
RBC: 4.57 MIL/uL (ref 4.22–5.81)
RDW: 13.5 % (ref 11.5–15.5)
WBC: 9.3 10*3/uL (ref 4.0–10.5)
nRBC: 0 % (ref 0.0–0.2)

## 2021-06-14 LAB — COMPREHENSIVE METABOLIC PANEL
ALT: 64 U/L — ABNORMAL HIGH (ref 0–44)
AST: 27 U/L (ref 15–41)
Albumin: 2.4 g/dL — ABNORMAL LOW (ref 3.5–5.0)
Alkaline Phosphatase: 91 U/L (ref 38–126)
Anion gap: 7 (ref 5–15)
BUN: 7 mg/dL (ref 6–20)
CO2: 26 mmol/L (ref 22–32)
Calcium: 8.7 mg/dL — ABNORMAL LOW (ref 8.9–10.3)
Chloride: 101 mmol/L (ref 98–111)
Creatinine, Ser: 0.87 mg/dL (ref 0.61–1.24)
GFR, Estimated: >60 mL/min (ref >60)
Glucose, Bld: 113 mg/dL — ABNORMAL HIGH (ref 70–99)
Potassium: 3.4 mmol/L — ABNORMAL LOW (ref 3.5–5.1)
Sodium: 134 mmol/L — ABNORMAL LOW (ref 135–145)
Total Bilirubin: 0.9 mg/dL (ref 0.3–1.2)
Total Protein: 6.5 g/dL (ref 6.5–8.1)

## 2021-06-14 LAB — MAGNESIUM: Magnesium: 2 mg/dL (ref 1.7–2.4)

## 2021-06-14 LAB — LIPASE, BLOOD: Lipase: 41 U/L (ref 11–51)

## 2021-06-14 MED ORDER — POTASSIUM CHLORIDE 10 MEQ/100ML IV SOLN
10.0000 meq | INTRAVENOUS | Status: AC
  Administered 2021-06-14 (×4): 10 meq via INTRAVENOUS
  Filled 2021-06-14 (×3): qty 100

## 2021-06-14 MED ORDER — LISINOPRIL 2.5 MG PO TABS: 5.0000 mg | ORAL_TABLET | Freq: Every day | ORAL | Status: DC

## 2021-06-14 MED ORDER — PANTOPRAZOLE SODIUM 40 MG IV SOLR
40.0000 mg | INTRAVENOUS | Status: DC
Start: 2021-06-14 — End: 2021-06-14
  Administered 2021-06-14: 40 mg via INTRAVENOUS
  Filled 2021-06-14: qty 10

## 2021-06-14 MED ORDER — HYDROCHLOROTHIAZIDE 12.5 MG PO TABS
6.2500 mg | ORAL_TABLET | Freq: Every day | ORAL | Status: DC
  Administered 2021-06-14 – 2021-06-15 (×2): 6.25 mg via ORAL
  Filled 2021-06-14 (×2): qty 1

## 2021-06-14 MED ORDER — OXYCODONE HCL 5 MG/5ML PO SOLN
5.0000 mg | Freq: Four times a day (QID) | ORAL | Status: DC | PRN
  Administered 2021-06-14: 5 mg via ORAL
  Filled 2021-06-14 (×3): qty 5

## 2021-06-14 MED ORDER — DIPHENHYDRAMINE HCL 50 MG/ML IJ SOLN
12.5000 mg | Freq: Three times a day (TID) | INTRAMUSCULAR | Status: DC | PRN
  Administered 2021-06-17 – 2021-06-28 (×4): 12.5 mg via INTRAVENOUS
  Filled 2021-06-14 (×4): qty 1

## 2021-06-14 MED ORDER — BISACODYL 10 MG RE SUPP
10.0000 mg | Freq: Once | RECTAL | Status: DC
Start: 2021-06-14 — End: 2021-06-24
  Filled 2021-06-14 (×3): qty 1

## 2021-06-14 MED ORDER — PANTOPRAZOLE SODIUM 40 MG PO TBEC
40.0000 mg | DELAYED_RELEASE_TABLET | Freq: Every day | ORAL | Status: DC
  Administered 2021-06-15: 40 mg via ORAL
  Filled 2021-06-14 (×2): qty 1

## 2021-06-14 MED ORDER — NYSTATIN 100000 UNIT/ML MT SUSP
5.0000 mL | Freq: Two times a day (BID) | OROMUCOSAL | Status: DC
  Administered 2021-06-14 – 2021-07-02 (×33): 500000 [IU] via OROMUCOSAL
  Filled 2021-06-14 (×35): qty 5

## 2021-06-14 MED ORDER — LISINOPRIL 2.5 MG PO TABS
5.0000 mg | ORAL_TABLET | Freq: Every day | ORAL | Status: DC
  Filled 2021-06-14: qty 2

## 2021-06-14 MED ORDER — LISINOPRIL-HYDROCHLOROTHIAZIDE 10-12.5 MG PO TABS: 0.5000 | ORAL_TABLET | Freq: Every day | ORAL | Status: DC

## 2021-06-14 NOTE — Progress Notes (Signed)
Pt c/o Morphine is not treating his pain and requested dilaudid. MD paged and notified of pt's request. ?

## 2021-06-14 NOTE — Progress Notes (Signed)
?PROGRESS NOTE ? ? ? ?Michael PlumberJohn R Leabo  WUJ:[id number] DOB: September 15, 1960 DOA: 06/11/2021 ?PCP: Lovey NewcomerBoyd, William S, PA  ? ? ? ?Brief Narrative:  ? ?Michael PlumberJohn R [name] is a 61 y.o. male with medical history significant of HTN and obesity presenting with back pain.  He was previously hospitalized from 3/17-22 with T8-9 discitis and E coli bacteremia following recent dental infection.   His antibiotics are scheduled to continue through 4/28 but he has had persistent n/v.   He is continuing to have severe back pain.  He has been to [facility] twice for pain and n/v ? ?Reports having dental pain, suppose to see a dentist, reports mouth wash helped ? ?Reports previous healthy, works full time as a Naval architecttruck driver and travels alot ? ? ?Subjective: ? ?Pointing to epigastric region and states it is being "sore" , makes him " don't want to eat anything", vomited small amount x1 this morning after taking pills ?Reports having hiccups for several weeks to a month ?Does not want to take pills by mouth, prefer all meds per IV ?Wants to try protonix to see if it helps ? ? ?C/o back pain and itchiness ,  ?Mother  at bedside ? ? ?Data reviewed:  ?No fever vital signs stable ?WBC normalized ?Sodium 134 ?Potassium 3.4 ?LFT  improving ? ?Assessment & Plan: ? Principal Problem: ?  Thoracic discitis ?Active Problems: ?  Oral thrush ?  Poor dentition ?  Essential hypertension ?  Class 1 obesity due to excess calories with body mass index (BMI) of 34.0 to 34.9 in adult ? ? ? ?Thoracic osteomyelitis ?-Previously admitted with thoracic discitis/osteo, Spinal aspirate cultures from recent admission positive for pansensitive E coli ?-He was initially treated with Ancef but had too much n/v to tolerate and so was changed to Rocephin prior to this admission ?-Back pain got worse return to the hospital ?- neurosurgery recommend IR to re aspirate for source control , no plan for surgical intervention , s/p T8/T9 disc space aspirate, T9 superior endplate biopsy  by IR  on 4/14 ?-ID following, antibiotic management per ID ?-Blood culture no growth ? ?Pain control ?Report takes oxycodone once a day at home ?Has required several doses of IV narcotic here due to increased pain and oral pain , reluctant to take oral meds ? ?Reported no BM for several days ?He thinks this is due to poor appetite ?He Does not want have stool softener,  ? ?N/V ?-he has been having n/v and was seen in the ED several times for this, the first time was on 3/9 prior to abx initiation, atypical presentation of gallstones? Vs esophageal candidiasis? As he has oral thrush ?-CT ab/pel, personally reviewed ,no bowel obstruction, no increased stool burden , + small gallstones ?-GI consulted  ? ? ?Mild elevate LFT, likely from infection from osteiomyelitis ?Will check ck and hepatitis panel for completeness ?Trend LFT ? ? ?HTN ?Continue home medication lisinopril/HTCZ ? ?Dental pain: ?DG orthpantogram: ?Multiple missing teeth with dental caries and periapical lucencies ?of the remaining teeth, described above. ?F/u with dentist  ? ?Oral thrush ?Topical treatment  ?Reports mouth wash helped ? ?obesity: Body mass index is 34.87 kg/m?Marland Kitchen.Marland Kitchen. ?Seen by dietician.  I agree with the assessment and plan as outlined below: ?Nutrition Status: ?Nutrition Problem: Inadequate oral intake ?Etiology: vomiting, acute illness, mouth pain, poor appetite ?Signs/Symptoms: per patient/family report ?Interventions: Ensure Enlive (each supplement provides 350kcal and 20 grams of protein), MVI ? ?Per chart review, he had sleep study in  the past but declined CPAP. ? ? ? ?I have Reviewed nursing notes, Vitals, pain scores, I/o's, Lab results and  imaging results since pt's last encounter, details please see discussion above  ?I ordered the following labs:  ?Unresulted Labs (From admission, onward)  ? ?  Start     Ordered  ? 06/15/21 0500  Comprehensive metabolic panel  Tomorrow morning,   R       ?Question:  Specimen collection method  Answer:   Lab=Lab collect  ? 06/14/21 0746  ? 06/15/21 0500  Hepatitis panel, acute  Tomorrow morning,   R       ?Question:  Specimen collection method  Answer:  Lab=Lab collect  ? 06/14/21 0746  ? 06/15/21 0500  CK  Tomorrow morning,   R       ?Question:  Specimen collection method  Answer:  Lab=Lab collect  ? 06/14/21 0746  ? ?  ?  ? ?  ? ? ? ?DVT prophylaxis: SCDs Start: 06/12/21 1059 ? ? ?Code Status:   Code Status: Full Code ? ?Family Communication:  mother at bedside ?Disposition:  ? ?Status is: Inpatient ? ?Dispo: The patient is from: Home ?             Anticipated d/c is to: Home ?             Anticipated d/c date is: TBD ? ?Antimicrobials:   ? ?Anti-infectives (From admission, onward)  ? ? Start     Dose/Rate Route Frequency Ordered Stop  ? 06/12/21 1000  cefTRIAXone (ROCEPHIN) 2 g in sodium chloride 0.9 % 100 mL IVPB       ? 2 g ?200 mL/hr over 30 Minutes Intravenous Every 24 hours 06/12/21 0901    ? ?  ? ? ? ? ? ?Objective: ?Vitals:  ? 06/13/21 1634 06/13/21 1928 06/14/21 0007 06/14/21 0330  ?BP:  (!) 150/90 (!) 152/88 139/85  ?Pulse: 90 93 90 100  ?Resp:  19 18 (!) 21  ?Temp:  97.7 ?F (36.5 ?C) 98 ?F (36.7 ?C) 98.5 ?F (36.9 ?C)  ?TempSrc:  Oral Oral Oral  ?SpO2:  96% 97% 95%  ?Weight:      ?Height:      ? ? ?Intake/Output Summary (Last 24 hours) at 06/14/2021 0747 ?Last data filed at 06/13/2021 2016 ?Gross per 24 hour  ?Intake 250 ml  ?Output --  ?Net 250 ml  ? ?Filed Weights  ? 06/11/21 2219  ?Weight: 113.4 kg  ? ? ?Examination: ? ?General exam: alert, awake, appear frustrated and weak, PICC line in right arm ?Respiratory system: Clear to auscultation. Respiratory effort normal. ?Cardiovascular system:  RRR.  ?Gastrointestinal system: Abdomen is nondistended, soft and nontender.  Normal bowel sounds heard. ?Central nervous system: Alert and oriented. No focal neurological deficits. ?Extremities:  no edema ?Skin: No rashes, lesions or ulcers ?Psychiatry: Appear frustrated ? ? ? ?Data Reviewed: I have personally  reviewed  labs and visualized  imaging studies since the last encounter and formulate the plan  ? ? ? ? ? ? ?Scheduled Meds: ? Chlorhexidine Gluconate Cloth  6 each Topical Daily  ? docusate sodium  100 mg Oral BID  ? feeding supplement  237 mL Oral BID BM  ? multivitamin with minerals  1 tablet Oral Daily  ? nystatin  5 mL Oral QID  ? sodium chloride flush  10-40 mL Intracatheter Q12H  ? sodium chloride flush  3 mL Intravenous Q12H  ? triamterene-hydrochlorothiazide  1 tablet Oral Daily  ? ?  Continuous Infusions: ? cefTRIAXone (ROCEPHIN)  IV 2 g (06/13/21 1023)  ? lactated ringers 75 mL/hr at 06/13/21 0112  ? potassium chloride    ? ? ? LOS: 2 days  ? ? ?Albertine Grates, MD PhD FACP ?[facility] ? ?Available via Epic secure chat 7am-7pm for nonurgent issues ?Please page for urgent issues ?To page the attending provider between 7A-7P or the covering provider during after hours 7P-7A, please log into the web site [email or web address] and access using universal [place] password for that web site. If you do not have the password, please call the hospital operator. ? ? ? ?06/14/2021, 7:47 AM  ? ? ?

## 2021-06-14 NOTE — Consult Note (Addendum)
? ?Referring Provider: Dr. Florencia Reasons ?Primary Care Physician:  Lavella Lemons, PA ?Primary Gastroenterologist: Althia Forts ? ?Reason for Consultation: Nausea and vomiting ? ?HPI: Michael [name] is a 61 y.o. male with a past medical history of obesity, hypertension, gallstones, hepatic steatosis and recently diagnosed osteomyelitis of the T8 and T9 vertebrae with E. coli bacteremia. ? ?He presented to [facility] ED 05/08/2021 as advised by his PCP secondary to nausea and vomiting with signs of dehydration.  He noted having back pain at this time.  He was discharged home on hydrocodone, Flexeril and Zofran with instructions to follow-up with his PCP. ? ?He was admitted to the hospital 05/16/2021 due to having persistent back pain for 2 to 3 weeks.  Labs showed significant leukocytosis.  WBC level 19. CTAP showed T8-T9 discitis which was confirmed by thoracic MRI.  Blood cultures grew E. coli.  He was started on IV antibiotics.  A PICC line was placed 05/2019 and he was discharged on IV antibiotics for total of 6 weeks. ? ?He presented to [facility] ED 05/23/2021 secondary to nausea and vomiting while being treated for ?thoracic discitis on IV Ceftriaxone at home.  Mild leukocytosis. He underwent RUQ sonogram 05/24/2021 which showed gallstones without evidence of acute cholecystitis, identified hepatic steatosis.  He received IV fluids and antiemetics and his symptoms stabilized so he was discharged home. ? ?He presented back to the ED 05/29/2021 due to having nausea with hyperkalemia (K+ 6.6), labs done as an outpatient.  He received IV fluids and Zofran.  Repeat potassium level in the ED was 3.4.  WBC count 16.1.  He was discharged home with instructions to follow-up with his primary care physician and ID. ? ?He presented to ED 06/12/2021 secondary to decreased appetite, weakness, abdominal and back pain.  Labs in the ED showed leukocytosis.  CTAP without evidence of acute intra-abdominal process but  identified worsening progression of osteomyelitis at T8 and T9.  He continues to have nausea with intermittent vomiting.   ? ?A GI consult was requested for further evaluation regarding his ongoing nausea and vomiting which started prior to the initiation of antibiotics for his T8-T9 osteomyelitis. ? ?He is a challenging historian.  His elderly mother is at the bedside she is facilitating obtaining his history.He believes his nausea and vomiting started a few days after he was started on IV antibiotics during his hospital admission 3/17 - 05/21/2021 for his thoracic osteomyelitis.  However, he was seen in the ED for nausea/vomiting and dehydration 05/08/2021 as noted above.  I assess his thoracic osteomyelitis with E. coli bacteremia was brewing at that time and likely contributed to his symptoms at that point.  He has nausea daily.  He vomits if he eats any solid food.  He emesis described as nonbloody liquid or bilious type fluid.  No coffee-ground emesis or frank hematemesis.  He was taking Phenergan as needed.  He describes of feeling upper abdominal soreness after he eats any food. He denies vomiting up any partially digested food.  He has intermittent heartburn.  No dysphagia.  He typically passes a normal brown bowel movement most days.  No rectal bleeding or black stools.  No bowel movement for the past 2 days.  He continues to have nausea which is fairly well controlled with Zofran at this time.  No vomiting today.  He was taking Oxycodone at home which likely contributed to his N/V episodes and he is receiving morphine 2 mg IV every 2  hours as needed and oxycodone 5 mg every 4 hours as needed or in this hospital admission.  He denies ever having an EGD.  Never had a screening colonoscopy.  No known family history of esophageal, gastric or colon cancer. ? ?Laboratory studies 06/04/2021 showed mildly elevated LFTs.  AST 39.  ALT 69.  Total bili 1.1.  ?Laboratory studies 06/11/2021: Alk phos 108.  AST 51.  ALT 109.   Total bili 0.8. ?Laboratory studies 06/14/2021: Alk phos 91.  Total bili 0.9.  AST 27.  ALT 64.  Lipase 41. ? ? ?Past Medical History:  ?Diagnosis Date  ? Class 1 obesity due to excess calories with body mass index (BMI) of 34.0 to 34.9 in adult   ? Diskitis   ? Hypertension   ? ? ?Past Surgical History:  ?Procedure Laterality Date  ? IR CERVICAL/THORACIC DISC ASPIRATION W/IMAG GUIDE  05/18/2021  ? IR FLUORO GUIDED NEEDLE PLC ASPIRATION/INJECTION LOC  06/13/2021  ? ? ?Prior to Admission medications   ?Medication Sig Start Date End Date Taking? Authorizing Provider  ?cefTRIAXone Sodium (ROCEPHIN IV) See admin instructions. 20 mg picc line every morning until discontinued by MD.   Yes [provider]  ?oxyCODONE-acetaminophen (PERCOCET/ROXICET) 5-325 MG tablet Take 1 tablet by mouth every 8 (eight) hours as needed for moderate pain. 05/29/21  Yes [provider]  ?promethazine (PHENERGAN) 25 MG suppository Place 1 suppository (25 mg total) rectally every 6 (six) hours as needed for up to 5 days for nausea or vomiting. 06/04/21 06/12/21 Yes Vu, Rockey Situ, MD  ?promethazine (PHENERGAN) 25 MG tablet Take 1 tablet (25 mg total) by mouth every 6 (six) hours as needed for nausea or vomiting. 05/28/21  Yes Vu, Rockey Situ, MD  ?promethazine (PHENERGAN) 6.25 MG/5ML syrup Take 20 mLs (25 mg total) by mouth every 6 (six) hours as needed for nausea or vomiting. 06/09/21  Yes Esmond Plants, RPH-CPP  ?triamterene-hydrochlorothiazide (MAXZIDE-25) 37.5-25 MG tablet Take 1 tablet by mouth daily. 04/24/21  Yes [provider]  ? ? ?Current Facility-Administered Medications  ?Medication Dose Route Frequency Provider Last Rate Last Admin  ? acetaminophen (TYLENOL) tablet 650 mg  650 mg Oral Q6H PRN Karmen Bongo, MD   650 mg at 06/14/21 0038  ? Or  ? acetaminophen (TYLENOL) suppository 650 mg  650 mg Rectal Q6H PRN Karmen Bongo, MD      ? bisacodyl (DULCOLAX) EC tablet 5 mg  5 mg Oral Daily PRN Karmen Bongo, MD       ? cefTRIAXone (ROCEPHIN) 2 g in sodium chloride 0.9 % 100 mL IVPB  2 g Intravenous Q24H Karmen Bongo, MD 200 mL/hr at 06/13/21 1023 2 g at 06/13/21 1023  ? Chlorhexidine Gluconate Cloth 2 % PADS 6 each  6 each Topical Daily Karmen Bongo, MD   6 each at 06/14/21 0933  ? diphenhydrAMINE (BENADRYL) injection 12.5 mg  12.5 mg Intravenous Q8H PRN Florencia Reasons, MD      ? docusate sodium (COLACE) capsule 100 mg  100 mg Oral BID Karmen Bongo, MD      ? feeding supplement (ENSURE ENLIVE / ENSURE PLUS) liquid 237 mL  237 mL Oral BID BM Florencia Reasons, MD      ? hydrALAZINE (APRESOLINE) injection 5 mg  5 mg Intravenous Q4H PRN Karmen Bongo, MD      ? lactated ringers infusion   Intravenous Continuous Karmen Bongo, MD 75 mL/hr at 06/13/21 0112 New Bag at 06/13/21 0112  ? morphine (PF) 2  MG/ML injection 2 mg  2 mg Intravenous Q2H PRN Karmen Bongo, MD   2 mg at 06/14/21 1028  ? multivitamin with minerals tablet 1 tablet  1 tablet Oral Daily Florencia Reasons, MD      ? nystatin (MYCOSTATIN) 100000 UNIT/ML suspension 500,000 Units  5 mL Oral QID Karmen Bongo, MD   500,000 Units at 06/13/21 1022  ? ondansetron (ZOFRAN) tablet 4 mg  4 mg Oral Q6H PRN Karmen Bongo, MD   4 mg at 06/14/21 0931  ? Or  ? ondansetron (ZOFRAN) injection 4 mg  4 mg Intravenous Q6H PRN Karmen Bongo, MD   4 mg at 06/13/21 1329  ? oxyCODONE (Oxy IR/ROXICODONE) immediate release tablet 5 mg  5 mg Oral Q4H PRN Karmen Bongo, MD   5 mg at 06/14/21 0038  ? polyethylene glycol (MIRALAX / GLYCOLAX) packet 17 g  17 g Oral Daily PRN Karmen Bongo, MD      ? potassium chloride 10 mEq in 100 mL IVPB  10 mEq Intravenous Q1 Hr x 4 Florencia Reasons, MD 100 mL/hr at 06/14/21 0947 10 mEq at 06/14/21 0947  ? sodium chloride flush (NS) 0.9 % injection 10-40 mL  10-40 mL Intracatheter Q12H Dawley, Troy C, DO   10 mL at 06/14/21 1314  ? sodium chloride flush (NS) 0.9 % injection 10-40 mL  10-40 mL Intracatheter PRN Dawley, Troy C, DO      ? sodium chloride flush (NS) 0.9 %  injection 3 mL  3 mL Intravenous Q12H Karmen Bongo, MD   3 mL at 06/14/21 0935  ? triamterene-hydrochlorothiazide (MAXZIDE-25) 37.5-25 MG per tablet 1 tablet  1 tablet Oral Daily Karmen Bongo, MD

## 2021-06-15 ENCOUNTER — Inpatient Hospital Stay (HOSPITAL_COMMUNITY): Payer: BC Managed Care – PPO

## 2021-06-15 DIAGNOSIS — R112 Nausea with vomiting, unspecified: Secondary | ICD-10-CM | POA: Diagnosis not present

## 2021-06-15 DIAGNOSIS — K089 Disorder of teeth and supporting structures, unspecified: Secondary | ICD-10-CM | POA: Diagnosis not present

## 2021-06-15 DIAGNOSIS — M4644 Discitis, unspecified, thoracic region: Secondary | ICD-10-CM | POA: Diagnosis not present

## 2021-06-15 LAB — HEPATITIS PANEL, ACUTE
HCV Ab: NONREACTIVE
Hep A IgM: NONREACTIVE
Hep B C IgM: NONREACTIVE
Hepatitis B Surface Ag: NONREACTIVE

## 2021-06-15 LAB — COMPREHENSIVE METABOLIC PANEL
ALT: 64 U/L — ABNORMAL HIGH (ref 0–44)
AST: 34 U/L (ref 15–41)
Albumin: 2.4 g/dL — ABNORMAL LOW (ref 3.5–5.0)
Alkaline Phosphatase: 90 U/L (ref 38–126)
Anion gap: 10 (ref 5–15)
BUN: 6 mg/dL (ref 6–20)
CO2: 23 mmol/L (ref 22–32)
Calcium: 8.8 mg/dL — ABNORMAL LOW (ref 8.9–10.3)
Chloride: 100 mmol/L (ref 98–111)
Creatinine, Ser: 0.87 mg/dL (ref 0.61–1.24)
GFR, Estimated: >60 mL/min (ref >60)
Glucose, Bld: 101 mg/dL — ABNORMAL HIGH (ref 70–99)
Potassium: 3.7 mmol/L (ref 3.5–5.1)
Sodium: 133 mmol/L — ABNORMAL LOW (ref 135–145)
Total Bilirubin: 0.9 mg/dL (ref 0.3–1.2)
Total Protein: 6.8 g/dL (ref 6.5–8.1)

## 2021-06-15 LAB — CK: Total CK: 24 U/L — ABNORMAL LOW (ref 49–397)

## 2021-06-15 IMAGING — CR DG ABDOMEN 1V
3 series · 3 of 3 positions shown · non-contrast
Comparison: [DATE]

CLINICAL DATA: Abdominal pain for 3 weeks. Nausea and vomiting.
Constipation.

EXAM:
ABDOMEN - 1 VIEW

[abdomen kub (1 of 3)]
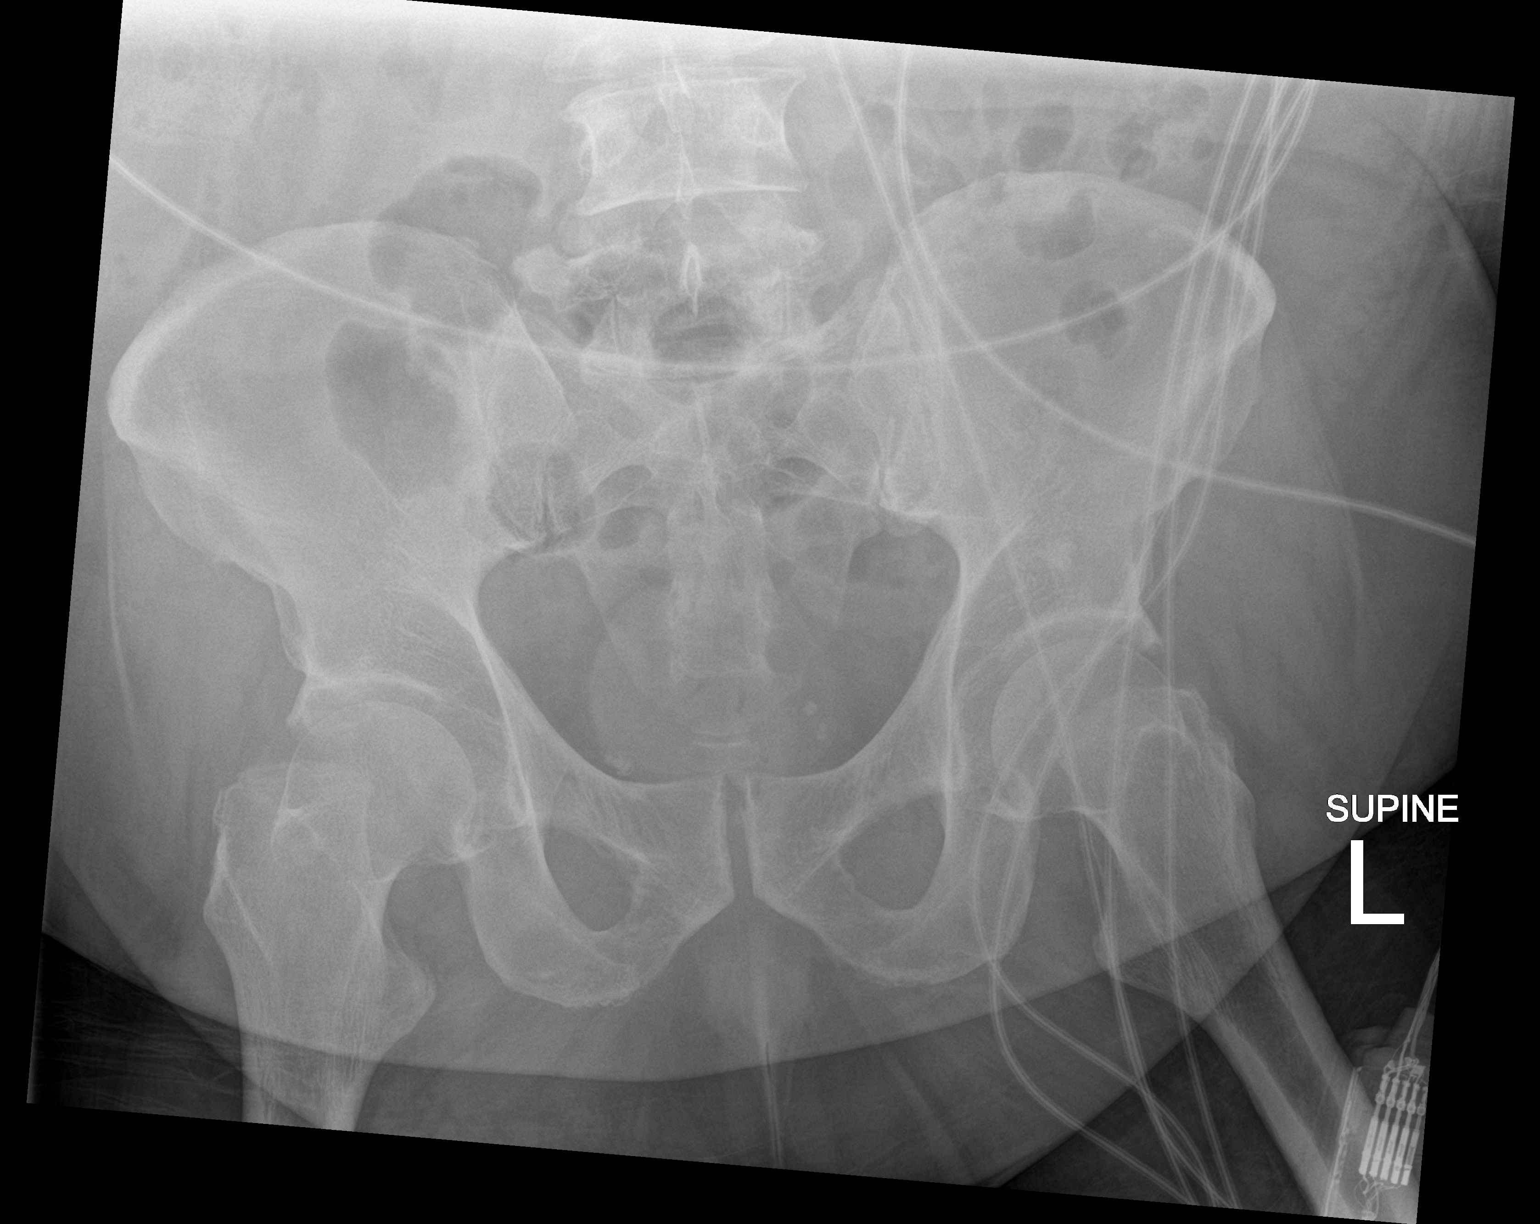

[abdomen kub (2 of 3)]
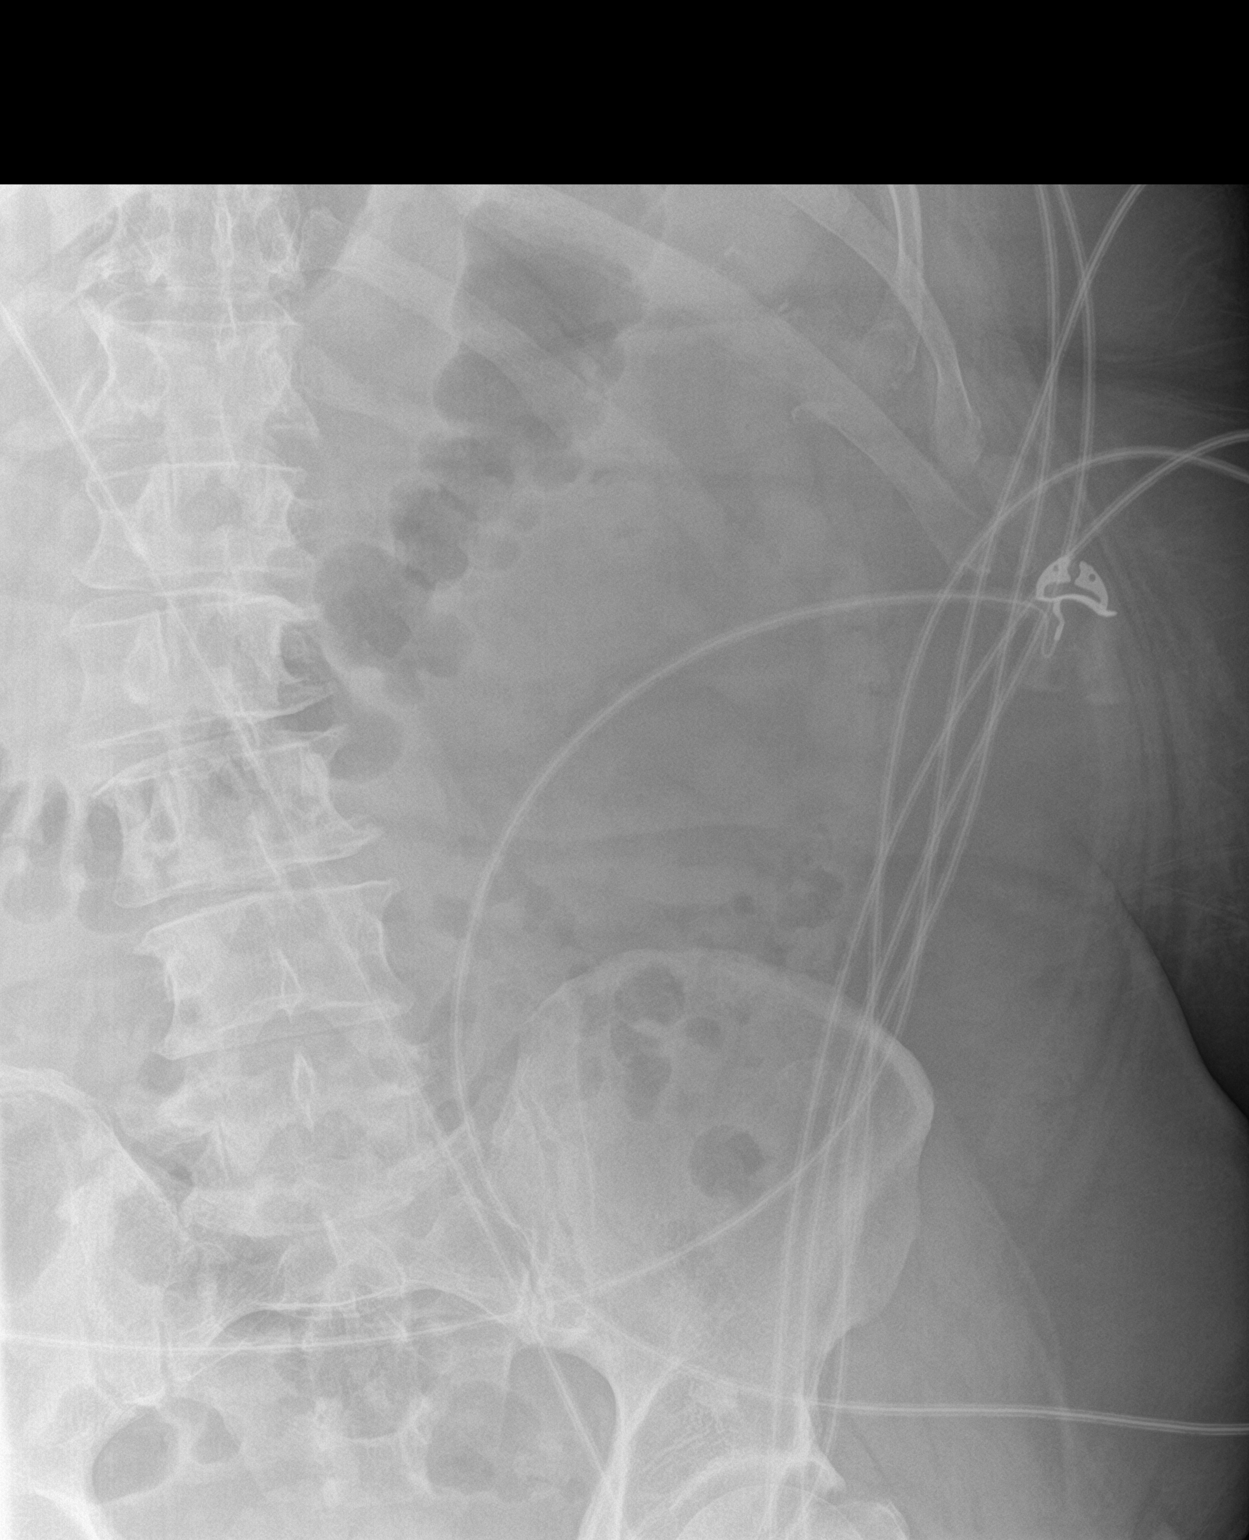

[abdomen kub (3 of 3)]
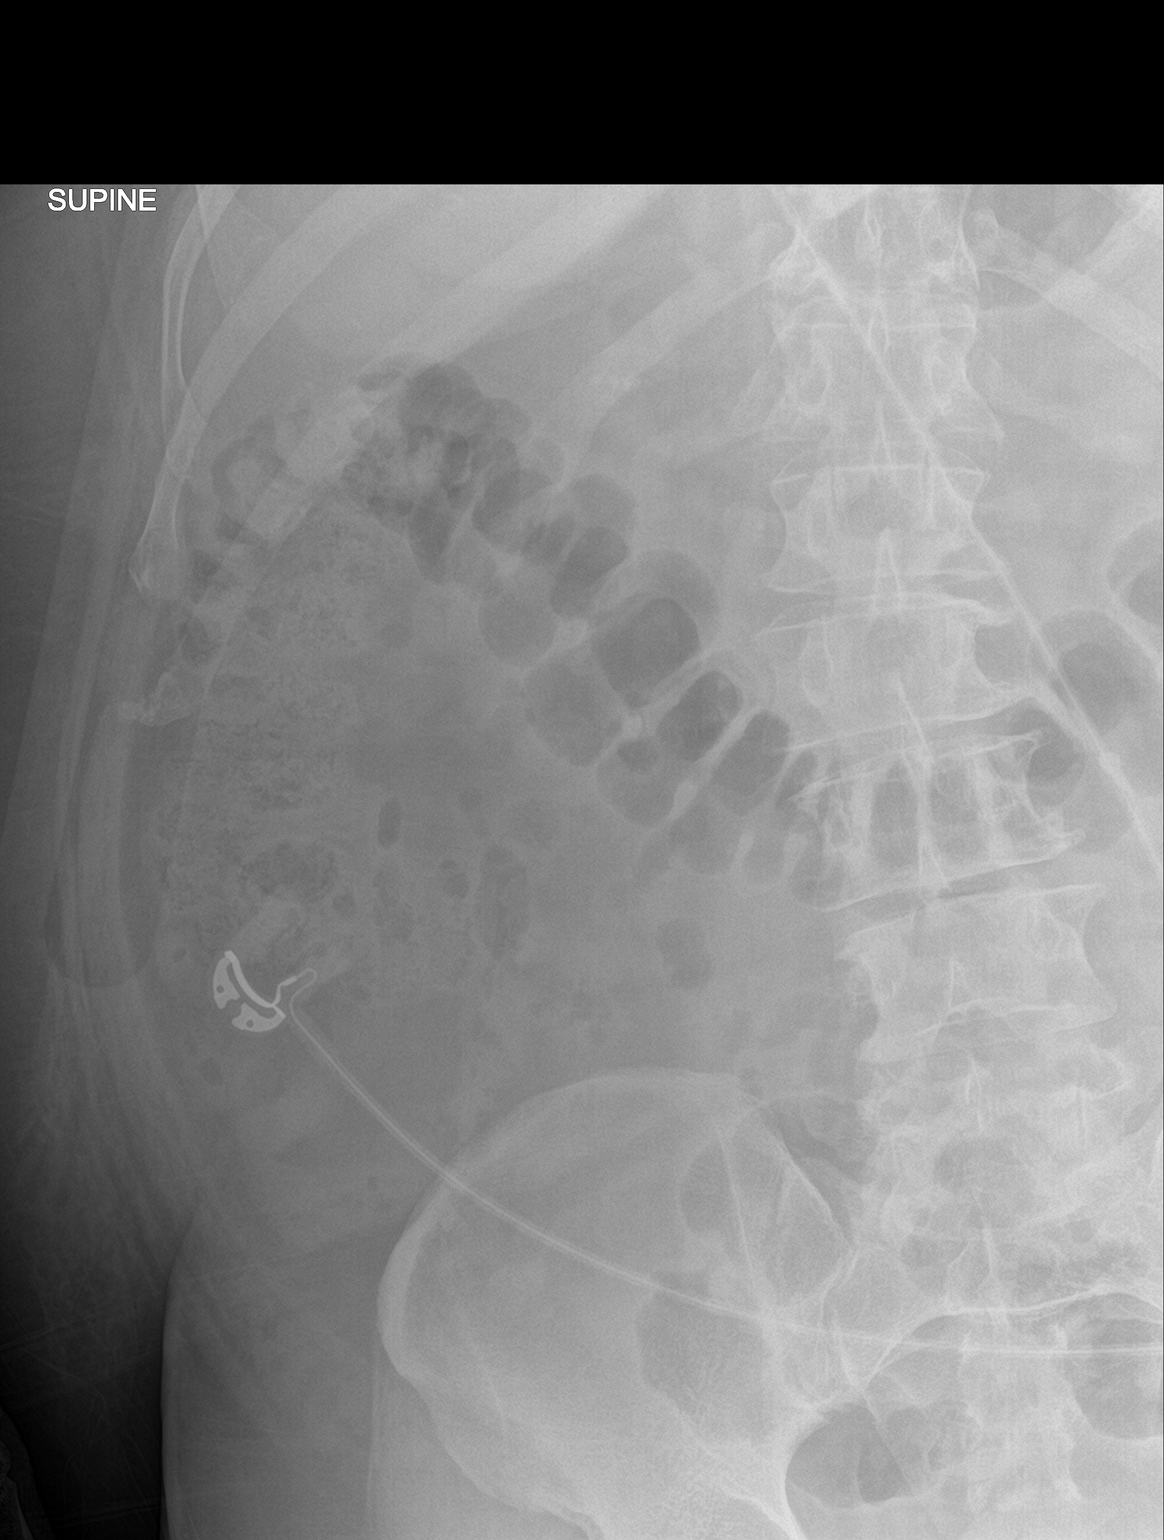

[3 of 3 positions shown; findings below may reference images not displayed]

FINDINGS: The bowel gas pattern is normal. Tiny right upper quadrant
radiodensities are again seen, consistent with tiny gallstones.
IMPRESSION: Unremarkable bowel gas pattern.

Cholelithiasis.

## 2021-06-15 MED ORDER — OXYCODONE HCL 5 MG PO TABS
5.0000 mg | ORAL_TABLET | ORAL | Status: DC | PRN
  Administered 2021-06-18 – 2021-06-25 (×17): 5 mg via ORAL
  Filled 2021-06-15 (×17): qty 1

## 2021-06-15 MED ORDER — LACTATED RINGERS IV SOLN: INTRAVENOUS | Status: AC

## 2021-06-15 MED ORDER — PANTOPRAZOLE SODIUM 40 MG IV SOLR
40.0000 mg | INTRAVENOUS | Status: DC
  Administered 2021-06-15 – 2021-06-16 (×2): 40 mg via INTRAVENOUS
  Filled 2021-06-15 (×2): qty 10

## 2021-06-15 MED ORDER — METOPROLOL TARTRATE 5 MG/5ML IV SOLN
2.5000 mg | Freq: Three times a day (TID) | INTRAVENOUS | Status: DC
  Administered 2021-06-15 – 2021-06-26 (×33): 2.5 mg via INTRAVENOUS
  Filled 2021-06-15 (×33): qty 5

## 2021-06-15 NOTE — Progress Notes (Signed)
?PROGRESS NOTE ? ? ? ?Michael PlumberJohn R [name]  WUJ:[id number] DOB: 08/02/1960 DOA: 06/11/2021 ?PCP: Lovey NewcomerBoyd, William S, PA  ? ? ? ?Brief Narrative:  ? ?Michael [name] is a 61 y.o. male with medical history significant of HTN and obesity presenting with back pain.   ?He was previously hospitalized from 3/17-22 with T8-9 discitis and E coli bacteremia following recent dental infection.   His antibiotics are scheduled to continue through 4/28. Returned to the hospital due to increased back pain ? he also c/o persistent n/v.   [facility] ? ?Reports having dental pain, suppose to see a dentist, reports mouth wash helped ? ?Reports previous healthy, works full time as a Naval architecttruck driver and travels alot ? ? ?Subjective: ? ?He vomited this am after taking am meds, he wants all meds per IV, ?Pointing to epigastric region and states it is being "sore" , makes him " don't want to eat anything", Reports having hiccups for several weeks to a month ? ? ?C/o back pain after being moved for x ray this am ? ?He does not want to get out of bed, does not want to start PT due to pain ? ? ? ?Data reviewed:  ?No fever vital signs stable ?WBC normalized ?Sodium 133 ?Potassium 3.7 ?LFT  improving ? ?Assessment & Plan: ? Principal Problem: ?  Thoracic discitis ?Active Problems: ?  Oral thrush ?  Poor dentition ?  Essential hypertension ?  Class 1 obesity due to excess calories with body mass index (BMI) of 34.0 to 34.9 in adult ? ? ? ?Thoracic osteomyelitis ?-Previously admitted with thoracic discitis/osteo, Spinal aspirate cultures from recent admission positive for pansensitive E coli ?- Ancef changed to Rocephin  prior to this admission due to N/V , thought side effect from ancef ?-Back pain got worse return to the hospital ?- neurosurgery recommend IR to re aspirate for source control , no plan for surgical intervention  ?-s/p T8/T9 disc space aspirate, T9 superior endplate biopsy  by IR on 4/14 ?-ID following, antibiotic  management per ID ?-culture no growth so far ? ?Pain control ?Report takes oxycodone once a day at home ?He prefers IV narcotic here due to increased pain and oral pain and n/v , reluctant to take any oral meds ? ?Reported no BM for several days ?He thinks this is due to poor appetite ?He Does not want have stool softener,  ? ?N/V ?-he has been having n/v and was seen in the ED several times for this, the first time was on 3/9 prior to abx initiation, atypical presentation of gallstones? Vs esophageal candidiasis? As he has oral thrush ?-CT ab/pel, personally reviewed ,no bowel obstruction, no increased stool burden , + small gallstones ?-GI consulted  ? ? ?Mild elevate LFT, likely from infection from osteiomyelitis ?Unremarkable ck and hepatitis panel  ?Trend LFT ? ? ?HTN ?home medication lisinopril/HTCZ, he does not want to take oral meds, changed to iv lopressor with holding parameters ? ?Dental pain: ?DG orthpantogram: ?Multiple missing teeth with dental caries and periapical lucencies ?of the remaining teeth, described above. ?F/u with dentist  ? ?Oral thrush ?Topical treatment  ?Reports mouth wash helped ? ?obesity: Body mass index is 34.87 kg/m?Marland Kitchen.Marland Kitchen. ?Seen by dietician.  I agree with the assessment and plan as outlined below: ?Nutrition Status: ?Nutrition Problem: Inadequate oral intake ?Etiology: vomiting, acute illness, mouth pain, poor appetite ?Signs/Symptoms: per patient/family report ?Interventions: Ensure Enlive (each supplement provides 350kcal and 20 grams  of protein), MVI ? ?Per chart review, he had sleep study in the past but declined CPAP. ? ? ? ?I have Reviewed nursing notes, Vitals, pain scores, I/o's, Lab results and  imaging results since pt's last encounter, details please see discussion above  ?I ordered the following labs:  ?Unresulted Labs (From admission, onward)  ? ?  Start     Ordered  ? 06/16/21 0500  Comprehensive metabolic panel  Tomorrow morning,   R       ?Question:  Specimen  collection method  Answer:  Unit=Unit collect  ? 06/15/21 2158  ? 06/16/21 0500  Phosphorus  Tomorrow morning,   R       ?Question:  Specimen collection method  Answer:  Unit=Unit collect  ? 06/15/21 2158  ? ?  ?  ? ?  ? ? ? ?DVT prophylaxis: Place and maintain sequential compression device Start: 06/14/21 1223 ?SCDs Start: 06/12/21 1059 ? ? ?Code Status:   Code Status: Full Code ? ?Family Communication:  mother at bedside ?Disposition:  ? ?Status is: Inpatient ? ?Dispo: The patient is from: Home ?             Anticipated d/c is to: Home ?             Anticipated d/c date is: TBD ? ?Antimicrobials:   ? ?Anti-infectives (From admission, onward)  ? ? Start     Dose/Rate Route Frequency Ordered Stop  ? 06/12/21 1000  cefTRIAXone (ROCEPHIN) 2 g in sodium chloride 0.9 % 100 mL IVPB       ? 2 g ?200 mL/hr over 30 Minutes Intravenous Every 24 hours 06/12/21 0901    ? ?  ? ? ? ? ? ?Objective: ?Vitals:  ? 06/15/21 1437 06/15/21 1559 06/15/21 2002 06/15/21 2100  ?BP:  136/90 (!) 133/92   ?Pulse:  88 93 84  ?Resp:  18 15   ?Temp:  98.3 ?F (36.8 ?C) 97.8 ?F (36.6 ?C)   ?TempSrc:  Oral Oral   ?SpO2: 95% 95% 96% 95%  ?Weight:      ?Height:      ? ? ?Intake/Output Summary (Last 24 hours) at 06/15/2021 2159 ?Last data filed at 06/15/2021 1834 ?Gross per 24 hour  ?Intake 120 ml  ?Output 1450 ml  ?Net -1330 ml  ? ?Filed Weights  ? 06/11/21 2219  ?Weight: 113.4 kg  ? ? ?Examination: ? ?General exam: alert, awake, appear frustrated and weak, PICC line in right arm ?Respiratory system: Clear to auscultation. Respiratory effort normal. ?Cardiovascular system:  RRR.  ?Gastrointestinal system: Abdomen is nondistended, soft and nontender.  Normal bowel sounds heard. ?Central nervous system: Alert and oriented. No focal neurological deficits. ?Extremities:  no edema ?Skin: No rashes, lesions or ulcers ?Psychiatry: Appear frustrated ? ? ? ?Data Reviewed: I have personally reviewed  labs and visualized  imaging studies since the last encounter  and formulate the plan  ? ? ? ? ? ? ?Scheduled Meds: ? bisacodyl  10 mg Rectal Once  ? Chlorhexidine Gluconate Cloth  6 each Topical Daily  ? feeding supplement  237 mL Oral BID BM  ? metoprolol tartrate  2.5 mg Intravenous Q8H  ? nystatin  5 mL Mouth/Throat BID  ? pantoprazole (PROTONIX) IV  40 mg Intravenous Q24H  ? sodium chloride flush  10-40 mL Intracatheter Q12H  ? sodium chloride flush  3 mL Intravenous Q12H  ? ?Continuous Infusions: ? cefTRIAXone (ROCEPHIN)  IV 2 g (06/15/21 7322)  ? lactated ringers    ? ? ?  LOS: 3 days  ? ? ?Albertine Grates, MD PhD FACP ?[facility] ? ?Available via Epic secure chat 7am-7pm for nonurgent issues ?Please page for urgent issues ?To page the attending provider between 7A-7P or the covering provider during after hours 7P-7A, please log into the web site [email or web address] and access using universal [place] password for that web site. If you do not have the password, please call the hospital operator. ? ? ? ?06/15/2021, 9:59 PM  ? ? ?

## 2021-06-15 NOTE — Progress Notes (Addendum)
? ? ? ?[facility] Progress Note ? ?CC:  Nausea and vomiting ? 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 He stated he gags easily. Continues to have nausea. Continues to have back pain.  No BM overnight or this morning.  He is passing gas per the rectum.  He refused to Dulcolax suppository last night and MiraLAX.  Received Morphine 2 mg IV at 6 AM and 40 2 AM. ? ?Objective:  ?Vital signs in last 24 hours: ?Temp:  [97.8 ?F (36.6 ?C)-98.7 ?F (37.1 ?C)] 97.8 ?F (36.6 ?C) (04/16 9169) ?Pulse Rate:  [92-101] 101 (04/16 0808) ?Resp:  [19-20] 19 (04/16 4503) ?BP: (128-161)/(89-101) 137/95 (04/16 8882) ?SpO2:  [95 %-100 %] 95 % (04/16 0808) ?Last BM Date : 06/10/21 ? ?Wt Readings from Last 3 Encounters:  ?06/11/21 113.4 kg  ?05/29/21 (!) 149.2 kg  ?05/28/21 (!) 149.2 kg  ?  ?General: Alert fatigued appearing 61 year old male in no acute distress. ?Heart:  Rate and rhythm, no murmurs ?Pulm: Breath sounds clear, decreased in the bases. ?Abdomen:  Soft, nontender.  Nondistended.  No palpable mass appreciated in obese abdomen.  Hypoactive bowel sounds to all 4 quadrants.  Dependent edema/anasarca to the lower abdomen. ?Extremities: Venodynes in use, no LE significant edema. ?Neurologic:  Alert and  oriented x 4. Grossly normal neurologically. ?Psych:  Alert and cooperative. Normal mood and affect. ? ?Intake/Output from previous day: ?04/15 0701 - 04/16 0700 ?In: 640 [P.O.:640] ?Out: 2350 [Urine:2350] ?Intake/Output this shift: ?No intake/output data recorded. ? ?Lab Results: ?Recent Labs  ?  06/14/21 ?0154  ?WBC 9.3  ?HGB 13.2  ?HCT 40.7  ?PLT 268  ? ?BMET ?Recent Labs  ?  06/14/21 ?0154 06/15/21 ?0248  ?NA 134* 133*  ?K 3.4* 3.7  ?CL 101 100  ?CO2 26 23  ?GLUCOSE 113* 101*  ?BUN 7 6  ?CREATININE 0.87 0.87  ?CALCIUM 8.7* 8.8*  ? ?LFT ?Recent Labs  ?  06/15/21 ?0248  ?PROT 6.8  ?ALBUMIN 2.4*  ?AST 34  ?ALT 64*  ?ALKPHOS 90  ?BILITOT 0.9   ? ?PT/INR ?No results for input(s): LABPROT, INR in the last 72 hours. ?Hepatitis Panel ?Recent Labs  ?  06/15/21 ?0248  ?HEPBSAG NON REACTIVE  ?HCVAB NON REACTIVE  ?HEPAIGM NON REACTIVE  ?HEPBIGM NON REACTIVE  ? ? ?DG Abd 1 View ? ?Result Date: 06/15/2021 ?CLINICAL DATA:  Abdominal pain for 3 weeks. Nausea and vomiting. Constipation. EXAM: ABDOMEN - 1 VIEW COMPARISON:  06/04/2021 FINDINGS: The bowel gas pattern is normal. Tiny right upper quadrant radiodensities are again seen, consistent with tiny gallstones. IMPRESSION: Unremarkable bowel gas pattern. Cholelithiasis. Electronically Signed   By: Danae Orleans M.D.   On: 06/15/2021 09:56  ? ?IR Fluoro Guide Ndl Plmt / BX ? ?Result Date: 06/13/2021 ?INDICATION: T8-T9 osteomyelitis discitis EXAM: 1. Aspiration of the T8-T9 disc space using fluoroscopic guidance 2. Core biopsy of the T9 superior endplate using fluoroscopic guidance MEDICATIONS: None. ANESTHESIA/SEDATION: Moderate (conscious) sedation was employed during this procedure. A total of Versed 3 mg and Fentanyl 150 mcg was administered intravenously. Moderate Sedation Time: 30 minutes. The patient's level of consciousness and vital signs were monitored continuously by radiology nursing throughout the procedure under my direct supervision. FLUOROSCOPY TIME:  Fluoroscopy Time: 2.1 minutes (58 mGy) COMPLICATIONS: None immediate. PROCEDURE: Informed written consent was obtained from the patient after a thorough discussion of the procedural risks, benefits and alternatives. All questions were  addressed. Maximal Sterile Barrier Technique was utilized including caps, mask, sterile gowns, sterile gloves, sterile drape, hand hygiene and skin antiseptic. A timeout was performed prior to the initiation of the procedure. The patient was positioned prone on the exam table. A right-sided unipedicular access was planned. Skin entry site(s) were marked overlying the T9 vertebral body using fluoroscopy. The overlying skin was  then prepped and draped in the standard sterile fashion. Local analgesia was obtained with 1% lidocaine. Attention was turned to the right side. Under fluoroscopic guidance, a 10 gauge introducer needle was advanced towards the lateral margin of the pedicle. Using multiple projections, the introducer needle was advanced towards the posterior margin of the vertebral body via a transpedicular approach. Through this access needle, coaxial core biopsy of the T9 superior endplate was performed. Scant solid material was obtained. Additional aspirate of the T8-T9 disc space was performed via same access needle. Core biopsy samples were submitted in saline for further handling. Additional disc space aspirate was sent to pathology for microbiology analysis. At the end of the procedure, all needles were removed. Hemostasis was achieved at the access site using manual pressure. A clean dressing was placed. No immediate complication. IMPRESSION: Successful T9 superior endplate biopsy and T8-T9 disc space aspirate using fluoroscopic guidance. Electronically Signed   By: Olive Bass M.D.   On: 06/13/2021 16:41   ? ?Assessment / Plan: ? ?89) 61 year old male with N/V, multifactorial: Likely due to acute illness with osteomyelitis with E. coli bacteremia, polypharmacy (IV antibiotics, Oxycodone p.o. and Morphine IV) and constipation.  CTAP with contrast 4/13 showed a normal stomach without evidence of a bowel obstruction. Significant weight loss secondary to poor po intake.  ?-Recommend lowest effective dose of oxycodone and morphine, difficult situation as he clearly has significant thoracic pain ?-Continue Ondansetron 4mg  IV scheduled Q 6hrs. May add Reglan 5mg  po or IV Q 8 hrs PRN ?-PPI IV QD ?-Continue IV fluids  ?-Consider EGD to rule out PUD/GOO, timing to be verified by Dr.  ?-May require enteral tube feedings, defer until EGD completed  ?  ?2) T8-T9 osteomyelitis associated E. coli bacteremia. Received  cefazolin IV 3/21 - 05/30/2021 then switched to Ceftriaxone 4/1 per ID.  WBC 9.3. ?  ?3) Constipation.  No BM x48 hours. Abdominal xray showed normal gas pattern without stool burden.  Patient did refused MiraLAX and Dulcolax suppository. ?  ?4) Elevated LFTs.  LFTs trending downward.  RUQ sonogram showed gallstones without evidence of acute cholecystitis.  CBD diameter measured at 3 mm and hepatic steatosis. Acute hepatitis panel negative. CK 24. Today AST 34. ALT 64.  ?-Continue to trend LFTs ? ?5) Oral thrush on Nystatin  ?  ? ?Principal Problem: ?  Thoracic discitis ?Active Problems: ?  Essential hypertension ?  Class 1 obesity due to excess calories with body mass index (BMI) of 34.0 to 34.9 in adult ?  Oral thrush ?  Poor dentition ? ? ? ? LOS: 3 days  ? ?4/21  06/15/2021, 11:24 AM ? ? ?I have taken a history, reviewed the chart and examined the patient. I performed a substantive portion of this encounter, including complete performance of at least one of the key components, in conjunction with the APP. I agree with the APP's note, impression and recommendations ? ?Patient tells me he feels that his nausea was better today.  He said he vomited today only because he was having significant pain during his x-ray.  He also  states that he is not worried about his lack of bowel movements, and that he often goes several days without a bowel movement.  This is why has been refusing the laxatives. ?We again discussed the role of upper endoscopy in evaluating chronic nausea and vomiting.  The patient is not very eager to undergo an upper endoscopy, stating that it is his mother who really wants him to have it done.  I recommended he have it done, but the timing of which would be dependent on the inpatient endoscopy schedule.  He does not have an urgent indication for endoscopy, and I do feel like his nausea and vomiting is most likely secondary to the factors listed previously (medication side effects,  pain, opioids) and less likely related to a primary GI problem that would be identified on EGD. ? ?Dr. Orvan FalconerBeavers will be assuming inpatient GI responsibilities tomorrow.  We will reassess for upper endoscopy tomorrow

## 2021-06-15 NOTE — Progress Notes (Signed)
After giving patient morning medications, patient vomited . Complained of abdominal pain and morphine given. MD Xu aware. ?

## 2021-06-16 DIAGNOSIS — R112 Nausea with vomiting, unspecified: Secondary | ICD-10-CM | POA: Diagnosis not present

## 2021-06-16 DIAGNOSIS — R748 Abnormal levels of other serum enzymes: Secondary | ICD-10-CM

## 2021-06-16 DIAGNOSIS — M4644 Discitis, unspecified, thoracic region: Secondary | ICD-10-CM | POA: Diagnosis not present

## 2021-06-16 LAB — COMPREHENSIVE METABOLIC PANEL
ALT: 79 U/L — ABNORMAL HIGH (ref 0–44)
AST: 51 U/L — ABNORMAL HIGH (ref 15–41)
Albumin: 2.6 g/dL — ABNORMAL LOW (ref 3.5–5.0)
Alkaline Phosphatase: 97 U/L (ref 38–126)
Anion gap: 11 (ref 5–15)
BUN: 10 mg/dL (ref 6–20)
CO2: 21 mmol/L — ABNORMAL LOW (ref 22–32)
Calcium: 8.9 mg/dL (ref 8.9–10.3)
Chloride: 100 mmol/L (ref 98–111)
Creatinine, Ser: 1.04 mg/dL (ref 0.61–1.24)
GFR, Estimated: >60 mL/min (ref >60)
Glucose, Bld: 162 mg/dL — ABNORMAL HIGH (ref 70–99)
Potassium: 4 mmol/L (ref 3.5–5.1)
Sodium: 132 mmol/L — ABNORMAL LOW (ref 135–145)
Total Bilirubin: 0.9 mg/dL (ref 0.3–1.2)
Total Protein: 7.5 g/dL (ref 6.5–8.1)

## 2021-06-16 LAB — PHOSPHORUS: Phosphorus: 2.8 mg/dL (ref 2.5–4.6)

## 2021-06-16 MED ORDER — HYDROMORPHONE HCL 1 MG/ML IJ SOLN
0.5000 mg | INTRAMUSCULAR | Status: DC | PRN
  Administered 2021-06-18 – 2021-06-26 (×8): 0.5 mg via INTRAVENOUS
  Filled 2021-06-16 (×8): qty 0.5

## 2021-06-16 MED ORDER — KETOROLAC TROMETHAMINE 15 MG/ML IJ SOLN
15.0000 mg | Freq: Three times a day (TID) | INTRAMUSCULAR | Status: AC
  Administered 2021-06-16 – 2021-06-21 (×14): 15 mg via INTRAVENOUS
  Filled 2021-06-16 (×15): qty 1

## 2021-06-16 MED ORDER — ONDANSETRON HCL 4 MG/2ML IJ SOLN
4.0000 mg | Freq: Three times a day (TID) | INTRAMUSCULAR | Status: DC
  Administered 2021-06-16 – 2021-07-02 (×36): 4 mg via INTRAVENOUS
  Filled 2021-06-16 (×37): qty 2

## 2021-06-16 MED ORDER — BOOST / RESOURCE BREEZE PO LIQD CUSTOM
1.0000 | Freq: Three times a day (TID) | ORAL | Status: DC
  Administered 2021-06-16 – 2021-06-19 (×4): 1 via ORAL
  Filled 2021-06-16: qty 1

## 2021-06-16 MED ORDER — ONDANSETRON HCL 4 MG PO TABS
4.0000 mg | ORAL_TABLET | Freq: Three times a day (TID) | ORAL | Status: DC
  Administered 2021-06-21 – 2021-07-01 (×10): 4 mg via ORAL
  Filled 2021-06-16 (×13): qty 1

## 2021-06-16 MED ORDER — PANTOPRAZOLE SODIUM 40 MG IV SOLR
40.0000 mg | Freq: Two times a day (BID) | INTRAVENOUS | Status: DC
  Administered 2021-06-16 – 2021-07-01 (×31): 40 mg via INTRAVENOUS
  Filled 2021-06-16 (×32): qty 10

## 2021-06-16 NOTE — TOC Initial Note (Signed)
Transition of Care (TOC) - Initial/Assessment Note  ? ? ?Patient Details  ?Name: Michael [name] ?MRN: [id number] ?Date of Birth: 09-14-1960 ? ?Transition of Care (TOC) CM/SW Contact:    ?Kermit Balo, RN ?Phone Number: ?06/16/2021, 12:57 PM ? ?Clinical Narrative:                 ?Patient is from home with his spouse. He has been receiving IV abx from home through [facility].  ?He has not been using any DME but wife is requesting a hospital bed.  ?Pts spouse provides the needed transportation and oversees his medications.  ?TOC following. ? ?Expected Discharge Plan: Home w Home Health Services ?Barriers to Discharge: Continued Medical Work up ? ? ?Patient Goals and CMS Choice ?  ?CMS Medicare.gov Compare Post Acute Care list provided to:: Patient ?Choice offered to / list presented to : Patient, Spouse ? ?Expected Discharge Plan and Services ?Expected Discharge Plan: Home w Home Health Services ?  ?Discharge Planning Services: CM Consult ?  ?Living arrangements for the past 2 months: Single Family Home ?                ?  ?  ?  ?  ?  ?HH Arranged: RN ?HH Agency:  Scientist, physiological) ?  ?  ?  ? ?Prior Living Arrangements/Services ?Living arrangements for the past 2 months: Single Family Home ?Lives with:: Spouse ?Patient language and need for interpreter reviewed:: Yes ?Do you feel safe going back to the place where you live?: Yes      ?  ?Care giver support system in place?: Yes (comment) ?  ?Criminal Activity/Legal Involvement Pertinent to Current Situation/Hospitalization: No - Comment as needed ? ?Activities of Daily Living ?Home Assistive Devices/Equipment: None ?ADL Screening (condition at time of admission) ?Patient's cognitive ability adequate to safely complete daily activities?: Yes ?Is the patient deaf or have difficulty hearing?: No ?Does the patient have difficulty seeing, even when wearing glasses/contacts?: No ?Does the patient have difficulty concentrating, remembering, or making  decisions?: No ?Patient able to express need for assistance with ADLs?: Yes ?Does the patient have difficulty dressing or bathing?: Yes ?Independently performs ADLs?: No ?Does the patient have difficulty walking or climbing stairs?: Yes ?Weakness of Legs: Both ?Weakness of Arms/Hands: None ? ?Permission Sought/Granted ?  ?  ?   ?   ?   ?   ? ?Emotional Assessment ?Appearance:: Appears stated age ?Attitude/Demeanor/Rapport: Engaged ?Affect (typically observed): Accepting ?Orientation: : Oriented to Self, Oriented to Place, Oriented to  Time, Oriented to Situation ?  ?Psych Involvement: No (comment) ? ?Admission diagnosis:  Thoracic discitis [M46.44] ?Osteomyelitis of other site, unspecified type (HCC) [M86.9] ?Patient Active Problem List  ? Diagnosis Date Noted  ? Thoracic discitis 06/12/2021  ? Class 1 obesity due to excess calories with body mass index (BMI) of 34.0 to 34.9 in adult 06/12/2021  ? Oral thrush 06/12/2021  ? Poor dentition 06/12/2021  ? Gram-negative bacteremia 05/17/2021  ? Osteomyelitis (HCC) 05/16/2021  ? Essential hypertension 05/16/2021  ? Obesity, Class III, BMI 40-49.9 (morbid obesity) (HCC) 05/16/2021  ? ?PCP:  [name], PA ?Pharmacy:   ?[facility] [phone] - [place], [place] - [address] AT [facility] [address] ?[address] ?[place]  [address] ?Phone: [phone] Fax: [phone] ? ? ? ? ?Social Determinants of Health (SDOH) Interventions ?  ? ?Readmission Risk Interventions ?   ? View : No data to display.  ?  ?  ?  ? ? ? ?

## 2021-06-16 NOTE — Progress Notes (Signed)
Length of Need 6 Months  °The above medical condition requires: Patient requires the ability to reposition frequently  °Head must be elevated greater than: 30 degrees  °Bed type Semi-electric  °Support Surface: Gel Overlay  ° °

## 2021-06-16 NOTE — Progress Notes (Signed)
Patient ID: Michael [name], male   DOB: 10-25-1960, 61 y.o.   MRN: [id number] ? ? ? Progress Note ? ? Subjective  ? Day # 5 ? CC; nausea /vomiting-thoracic discitis/E. coli bacteremia ? ?Sodium 132/potassium 4/creatinine 1.04 ?T. bili 0.9/alk phos 97/AST 51/ALT 79 ? ?Patient says he has only vomited in the past 24 hours after trying to take Ensure, he is able to keep soups down without difficulty, no specific complaints of abdominal pain.  He continues to have severe back pain ?Bowel movement 2 days ago ? ? Objective  ? ?Vital signs in last 24 hours: ?Temp:  [97.7 ?F (36.5 ?C)-98.3 ?F (36.8 ?C)] 97.7 ?F (36.5 ?C) (04/17 1114) ?Pulse Rate:  [84-108] 103 (04/17 1114) ?Resp:  [15-19] 16 (04/17 1114) ?BP: (110-145)/(84-96) 130/84 (04/17 1114) ?SpO2:  [89 %-96 %] 89 % (04/17 1114) ?Last BM Date : 06/10/21 ?General:    Older white male in NAD, morbidly obese ?Heart:  Regular rate and rhythm; no murmurs ?Lungs: Respirations even and unlabored, lungs CTA bilaterally ?Abdomen:  Soft, morbidly obese ,some tenderness across the upper abdomen, bowel sounds quiet ?Extremities:  Without edema. ?Neurologic:  Alert and oriented,  grossly normal neurologically. ?Psych:  Cooperative. Normal mood and affect. ? ?Intake/Output from previous day: ?04/16 0701 - 04/17 0700 ?In: 120 [P.O.:120] ?Out: 950 [Urine:950] ?Intake/Output this shift: ?Total I/O ?In: -  ?Out: 600 [Urine:300; Emesis/NG output:300] ? ?Lab Results: ?Recent Labs  ?  06/14/21 ?0154  ?WBC 9.3  ?HGB 13.2  ?HCT 40.7  ?PLT 268  ? ?BMET ?Recent Labs  ?  06/14/21 ?0154 06/15/21 ?0248 06/16/21 ?0203  ?NA 134* 133* 132*  ?K 3.4* 3.7 4.0  ?CL 101 100 100  ?CO2 26 23 21*  ?GLUCOSE 113* 101* 162*  ?BUN _0 ?CREATININE 0.87 0.87 1.04  ?CALCIUM 8.7* 8.8* 8.9  ? ?LFT ?Recent Labs  ?  06/16/21 ?0203  ?PROT 7.5  ?ALBUMIN 2.6*  ?AST 51*  ?ALT 79*  ?ALKPHOS 97  ?BILITOT 0.9  ? ?PT/INR ?No results for input(s): LABPROT, INR in the last 72 hours. ? ?Studies/Results: ?DG Abd 1  View ? ?Result Date: 06/15/2021 ?CLINICAL DATA:  Abdominal pain for 3 weeks. Nausea and vomiting. Constipation. EXAM: ABDOMEN - 1 VIEW COMPARISON:  06/04/2021 FINDINGS: The bowel gas pattern is normal. Tiny right upper quadrant radiodensities are again seen, consistent with tiny gallstones. IMPRESSION: Unremarkable bowel gas pattern. Cholelithiasis. Electronically Signed   By: Marlaine Hind M.D.   On: 06/15/2021 09:56   ? ? ? ? Assessment / Plan:   ? ?#50 61 year old white male with thoracic osteomyelitis/discitis-continued severe back pain ? ?#2 mild elevation of LFTs likely reactive secondary to osteomyelitis/infection ?#3 cholelithiasis ? ?#4 nausea vomiting-inadequate p.o. intake-very likely multifactoral secondary to narcotics, antibiotics, pain, discitis ? ?Not tolerating Ensure, is able to tolerate full liquids ? ?Plan; full liquids as tolerated, ?Try resourced berry breeze ?Twice daily PPI IV ?Scheduled doses of Zofran ?Will discuss with team, consider EGD though may not change management much ? ?Patient has PICC line in place and if unable to get him to tolerate p.o.'s in the next day or 2 then consider TPN ? ? ?Principal Problem: ?  Thoracic discitis ?Active Problems: ?  Essential hypertension ?  Class 1 obesity due to excess calories with body mass index (BMI) of 34.0 to 34.9 in adult ?  Oral thrush ?  Poor dentition ? ? ? ? LOS: 4 days  ? ?Masyn Rostro PA-C 06/16/2021, 11:27 AM ?  ?

## 2021-06-16 NOTE — Progress Notes (Signed)
?PROGRESS NOTE ? ? ? ?Michael [name]  ZOX:[id number] DOB: 1960-08-23 DOA: 06/11/2021 ?PCP: Lovey NewcomerBoyd, William S, PA  ? ? ?Brief Narrative:  ? ?Michael [name] with past medical history significant for essential hypertension, obesity who presented to [facility] ED on 4/12 with progressive back pain.  Patient also reports persistent nausea/vomiting; and has been seen at the [facility] ED on multiple occasions for such complaint.  Patient recently hospitalized from 3/17 - 3/22 with findings of T8-9 discitis and E. coli bacteremia following recent dental infection.  Patient was discharged on Ancef to continue through 06/27/2021. ? ?In the ED, WBC count 12.3, hemoglobin 16.2, platelets 309.  Sodium 133, potassium 3.2, chloride 98, CO2 23, glucose 178, BUN 12, creatinine 0.99.  Lipase 41, AST 51, ALT 109.  Total bilirubin 0.8.  CRP 10.2, lactic acid 2.4.  Urinalysis unrevealing.  UDS positive for opiates.  CT abdomen/pelvis with interval worsening of known discitis/hospital-itis at T8-T9 with increased bony erosions and fragmentation of the endplates with increased paravertebral inflammatory changes and soft tissue density, moderate spinal canal stenosis, patchy atelectasis versus infiltrate lung bases, cholelithiasis, stable right adrenal adenoma, degenerative changes lumbar spine with severe spinal canal stenosis L2-3, L4-5.  MR T-spine with substantial progression T8-9 discitis/osteomyelitis since 3/17, eroded endplates with 25% loss of T8 vertebral body height since that time, probable septic facet joints, increased paraspinal phlegmon and can fluid epidural and dural thickening, increased spinal stenosis T8-9 spinal cord mass effect but with no cord edema.  Seen by neurosurgery, recommend medical management, continue IV antibiotics.  Infectious disease consulted.  Hospital service consulted for further evaluation and management of thoracic spine progressive discitis/osteomyelitis. ? ?Assessment & Plan: ?   ?T8-9 discitis/osteomyelitis ?Patient representing to the ED with progressive back pain.  Imaging findings significant for substantial progression of T8-9 discitis/osteomyelitis since 3/17, eroded endplates with 25% loss of T8 vertebral body height since that time, probable septic facet joints, increased paraspinal phlegmon and can fluid epidural and dural thickening, increased spinal stenosis T8-9 spinal cord mass effect but with no cord edema.  CRP elevated 10.2, lactic acid 2.5, WBC count 12.3.  Seen by neurosurgery, recommend medical management, continue IV antibiotics.  ?--Infectious disease following, appreciate assistance ?--Blood cultures x2: No growth x3 days ?--S/p T8-9 disc space aspiration by IR 4/14, culture with no growth x2 days ?--Toradol 50 mg IV every 8 hours x5 days ?--Oxycodone 5 mg every 4 hours PRN moderate pain ?-- Loaded 0.5 mg IV every 3 hours as needed severe breakthrough pain ?--Ceftriaxone 2 g IV every 24 hours ? ?Nausea/vomiting ?Patient with episodic nausea/vomiting, seen in the ED several times.  CT abdomen/pelvis with no bowel obstruction, no increased stool burden with notable small gallstones.  Etiology likely secondary to esophageal candidiasis with known current oral thrush versus atypical presentation of gallstones. ?--Seen by gastroenterology, offered EGD but patient declined.  Now following peripherally. ?--Protonix 40 mg IV q12h ?--Full liquid diet, advance as tolerates ?--Supportive care, antiemetics ? ?Elevated LFTs ?Etiology likely secondary to progressive infection from osteomyelitis versus medication effect with ceftriaxone.  CK level within normal limits, hepatitis panel negative. ?--AST ?--ALT ?--Tbili ?--continue to trend LFTs ? ?Essential hypertension ?Home regimen inclueds lisinopril-HCTZ 10-12.5 mg p.o. daily. ?--Holding home antihypertensives currently due to borderline hypotension ?--Monitor BP closely ? ?Oral thrush ?--Nystatin twice daily ? ?Dental pain ?DG  orthopantogram with multiple missing teeth with dental caries and periapical lucencies. ?--Outpatient follow-up with dentist ? ?Morbid obesity ?Body mass index  is 34.87 kg/m?Marland Kitchen  Discussed with patient needs for aggressive lifestyle changes/weight loss as this complicates all facets of care.  Outpatient follow-up with PCP.   ? ? ? ? ?DVT prophylaxis: Place and maintain sequential compression device Start: 06/14/21 1223 ?SCDs Start: 06/12/21 1059 ? ?  Code Status: Full Code ?Family Communication:  ? ?Disposition Plan:  ?Level of care: Telemetry Medical ?Status is: Inpatient ?Remains inpatient appropriate because: Further advancement of diet, awaiting finalized recommendations per interventional radiology; PT/OT evaluation ?  ? ?Consultants:  ?Neurosurgery, Dr. Jake Samples ?Infectious disease ?[facility] - on standby as of 4/17 as as patient refused EGD ? ?Procedures:  ?T8-9 aspiration by IR 4/14 ? ?Antimicrobials:  ?Ceftriaxone 4/1>> ? ? ?Subjective: ?Patient seen and examined at bedside, lying in bed.  Continues to report back pain with occasional nausea, and epigastric pain.  No further vomiting reported over last 24 hours.  On full liquid diet but with poor oral intake.  By gastroenterology, recommend EGD but patient declined today.  IR aspiration cultures show no growth for 2 days.  Blood cultures remain negative.  Patient requesting change of morphine to Toradol and Dilaudid.  No other specific questions or concerns at this time.  Denies headache, no visual changes, no fever/chills/night sweats, no vomiting/diarrhea, no weakness, no fatigue, no paresthesias.  No acute events overnight per nursing staff. ? ?Objective: ?Vitals:  ? 06/16/21 0359 06/16/21 0933 06/16/21 1114 06/16/21 1318  ?BP: (!) 135/96 (!) 145/91 130/84 (!) 134/92  ?Pulse: (!) 108 (!) 107 (!) 103 100  ?Resp: 19 18 16 16   ?Temp: 98.3 ?F (36.8 ?C)  97.7 ?F (36.5 ?C)   ?TempSrc: Oral  Axillary   ?SpO2: 95% 94% (!) 89% 94%  ?Weight:      ?Height:       ? ? ?Intake/Output Summary (Last 24 hours) at 06/16/2021 1414 ?Last data filed at 06/16/2021 1100 ?Gross per 24 hour  ?Intake --  ?Output 1550 ml  ?Net -1550 ml  ? ?Filed Weights  ? 06/11/21 2219  ?Weight: 113.4 kg  ? ? ?Examination: ? ?Physical Exam: ?GEN: NAD, alert and oriented x 3, chronically ill appearance, appears older than stated age ?HEENT: NCAT, PERRL, EOMI, sclera clear, MMM, poor dentition ?PULM: CTAB w/o wheezes/crackles, normal respiratory effort, on room air ?CV: RRR w/o M/G/R ?GI: abd soft, NTND, NABS, no R/G/M ?MSK: no peripheral edema, muscle strength globally intact 5/5 bilateral upper/lower extremities, PICC line noted right upper extremity ?NEURO: CN II-XII intact, no focal deficits, sensation to light touch intact ?PSYCH: normal mood/affect ?Integumentary: dry/intact, no rashes or wounds ? ? ? ?Data Reviewed: I have personally reviewed following labs and imaging studies ? ?CBC: ?Recent Labs  ?Lab 06/11/21 ?2257 06/14/21 ?0154  ?WBC 12.3* 9.3  ?NEUTROABS 9.1* 6.1  ?HGB 16.2 13.2  ?HCT 49.4 40.7  ?MCV 89.0 89.1  ?PLT 309 268  ? ?Basic Metabolic Panel: ?Recent Labs  ?Lab 06/11/21 ?2257 06/14/21 ?0154 06/15/21 ?0248 06/16/21 ?0203  ?NA 133* 134* 133* 132*  ?K 3.2* 3.4* 3.7 4.0  ?CL 98 101 100 100  ?CO2 23 26 23  21*  ?GLUCOSE 178* 113* 101* 162*  ?BUN 12 7 6 10   ?CREATININE 0.99 0.87 0.87 1.04  ?CALCIUM 9.3 8.7* 8.8* 8.9  ?MG  --  2.0  --   --   ?PHOS  --   --   --  2.8  ? ?GFR: ?Estimated Creatinine Clearance: 97.9 mL/min (by C-G formula based on SCr of 1.04 mg/dL). ?Liver Function Tests: ?Recent  Labs  ?Lab 06/11/21 ?2257 06/14/21 ?0154 06/15/21 ?0248 06/16/21 ?0203  ?AST 51* 27 34 51*  ?ALT 109* 64* 64* 79*  ?ALKPHOS 108 91 90 97  ?BILITOT 0.8 0.9 0.9 0.9  ?PROT 7.8 6.5 6.8 7.5  ?ALBUMIN 2.8* 2.4* 2.4* 2.6*  ? ?Recent Labs  ?Lab 06/14/21 ?0154  ?LIPASE 41  ? ?No results for input(s): AMMONIA in the last 168 hours. ?Coagulation Profile: ?Recent Labs  ?Lab 06/11/21 ?2257  ?INR 1.4*  ? ?Cardiac  Enzymes: ?Recent Labs  ?Lab 06/15/21 ?0248  ?CKTOTAL 24*  ? ?BNP (last 3 results) ?No results for input(s): PROBNP in the last 8760 hours. ?HbA1C: ?No results for input(s): HGBA1C in the last 72 hours. ?CBG: ?No result

## 2021-06-16 NOTE — Progress Notes (Signed)
?   ? ? ? ? ?[facility] ? ?Date of Admission:  06/11/2021   Total days of inpatient antibiotics 4 ? ?Principal Problem: ?  Thoracic discitis ?Active Problems: ?  Essential hypertension ?  Class 1 obesity due to excess calories with body mass index (BMI) of 34.0 to 34.9 in adult ?  Oral thrush ?  Poor dentition ?     ?    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 ?  ?#Progressing T8-T9 osteomyelitis with increasing paraspinal phlegmon with Ecoli ?#SP IR aspiration on 4/14 with negative Cx ?-3/17 blood Cx+ Ecoli, 3/19 Aspirate Cx+ Ecoli. ?-Abx: cefazolin 3/21-3/31(N/V)->ceftriaxone 4/1-p(EOT 4/28 to complete 8 weeks of antibiotics).  ?-CT AP showed chololithiasis, possible infiltrate vs atelectasis lung base. Pt has a chronic cough per wife and is on RA, favor atelectatics.  ?-MRI shows worsening  findings of osteo/phlegmon and back pain increased on appropriate antibiotics. NSY engaged and recommend IR aspiration. IR aspiration from 4/14 Ngx3 days.  ?Recommendations:  ?-Continue ceftriaxone. As remain negative will continue current course of IV antibiotics which are due to end 4/28.  ?-Follow up with Dr. Renold Don rescheduled to 4/26. I spoke with pt today and following completion of IV antibiotics we can add an additional 2 weeks of cefadroxil and consider re-imaging spine at that point ? ?#RUQ pain ?#Mildly elevated LFTs ?GI engaged and noted LEFT increase more likely due to infection and treatment that stones.  ?Ceftriaxone is associated with increased LFTs. Given mild increase, will monitor for now.  ? ? ? ?Microbiology:   ?Antibiotics: ?Ceftriaxone 4/1-p ?Prior to hospitalization ?Cefazolin 3/21-3/31 ?3/17-3/20 vanc and ctx ?Cultures: ?Blood ?4/12 p ?Urine ?4/12 p ? ?SUBJECTIVE: ?Resting in bed. Continues to have back pain.  ? ?Review of Systems: ?Review of Systems  ?All other systems reviewed  and are negative. ? ? ?Scheduled Meds: ? bisacodyl  10 mg Rectal Once  ? Chlorhexidine Gluconate Cloth  6 each Topical Daily  ? feeding supplement  1 Container Oral TID BM  ? feeding supplement  237 mL Oral BID BM  ? metoprolol tartrate  2.5 mg Intravenous Q8H  ? nystatin  5 mL Mouth/Throat BID  ? ondansetron  4 mg Oral Q8H  ? Or  ? ondansetron (ZOFRAN) IV  4 mg Intravenous Q8H  ? pantoprazole (PROTONIX) IV  40 mg Intravenous Q12H  ? sodium chloride flush  10-40 mL Intracatheter Q12H  ? sodium chloride flush  3 mL Intravenous Q12H  ? ?Continuous Infusions: ? cefTRIAXone (ROCEPHIN)  IV 2 g (06/16/21 0947)  ? lactated ringers 75 mL/hr at 06/16/21 1053  ? ?PRN Meds:.acetaminophen **OR** acetaminophen, bisacodyl, diphenhydrAMINE, hydrALAZINE, morphine injection, oxyCODONE, polyethylene glycol, sodium chloride flush ?No Known Allergies ? ?OBJECTIVE: ?Vitals:  ? 06/16/21 0359 06/16/21 0933 06/16/21 1114 06/16/21 1318  ?BP: (!) 135/96 (!) 145/91 130/84 (!) 134/92  ?Pulse: (!) 108 (!) 107 (!) 103 100  ?Resp: 19 18 16 16   ?Temp: 98.3 ?F (36.8 ?C)  97.7 ?F (36.5 ?C)   ?TempSrc: Oral  Axillary   ?SpO2: 95% 94% (!) 89% 94%  ?Weight:      ?Height:      ? ?Body mass index is 34.87 kg/m?. ? ?Physical Exam ?Constitutional:   ?   General: He is not in acute distress. ?   Appearance: He is normal weight. He is not toxic-appearing.  ?HENT:  ?   Head: Normocephalic and atraumatic.  ?  Right Ear: External ear normal.  ?   Left Ear: External ear normal.  ?   Nose: No congestion or rhinorrhea.  ?   Mouth/Throat:  ?   Mouth: Mucous membranes are moist.  ?   Pharynx: Oropharynx is clear.  ?Eyes:  ?   Extraocular Movements: Extraocular movements intact.  ?   Conjunctiva/sclera: Conjunctivae normal.  ?   Pupils: Pupils are equal, round, and reactive to light.  ?Cardiovascular:  ?   Rate and Rhythm: Normal rate and regular rhythm.  ?   Heart sounds: No murmur heard. ?  No friction rub. No gallop.  ?Pulmonary:  ?   Effort: Pulmonary effort is  normal.  ?   Breath sounds: Normal breath sounds.  ?Abdominal:  ?   General: Abdomen is flat. Bowel sounds are normal.  ?   Palpations: Abdomen is soft.  ?Musculoskeletal:     ?   General: No swelling. Normal range of motion.  ?   Cervical back: Normal range of motion and neck supple.  ?Skin: ?   General: Skin is warm and dry.  ?Neurological:  ?   General: No focal deficit present.  ?   Mental Status: He is oriented to person, place, and time.  ?Psychiatric:     ?   Mood and Affect: Mood normal.  ? ? ? ? ?Lab Results ?Lab Results  ?Component Value Date  ? WBC 9.3 06/14/2021  ? HGB 13.2 06/14/2021  ? HCT 40.7 06/14/2021  ? MCV 89.1 06/14/2021  ? PLT 268 06/14/2021  ?  ?Lab Results  ?Component Value Date  ? CREATININE 1.04 06/16/2021  ? BUN 10 06/16/2021  ? NA 132 (L) 06/16/2021  ? K 4.0 06/16/2021  ? CL 100 06/16/2021  ? CO2 21 (L) 06/16/2021  ?  ?Lab Results  ?Component Value Date  ? ALT 79 (H) 06/16/2021  ? AST 51 (H) 06/16/2021  ? ALKPHOS 97 06/16/2021  ? BILITOT 0.9 06/16/2021  ?  ? ? ? ? ?Danelle Earthly, MD ?[facility] ?[facility] ?06/16/2021, 1:54 PM  ?

## 2021-06-16 NOTE — Progress Notes (Signed)
PHARMACY CONSULT NOTE FOR: ? ?OUTPATIENT  PARENTERAL ANTIBIOTIC THERAPY (OPAT) ? ?Indication: T8-T9 Osteo ?Regimen: Rocephin 2g IV every 24 hours ?End date: 06/27/21 ? ?IV antibiotic discharge orders are pended. ?To discharging provider:  please sign these orders via discharge navigator,  ?Select New Orders & click on the button choice - Manage This Unsigned Work.  ?  ? ?Thank you for allowing pharmacy to be a part of this patient?s care. ? ?Georgina Pillion, PharmD, BCPS ?Infectious Diseases Clinical Pharmacist ?06/16/2021 2:50 PM  ? ?**Pharmacist phone directory can now be found on [email or web address] ([email or web address]).  Listed under [facility].  ?

## 2021-06-17 ENCOUNTER — Inpatient Hospital Stay: Payer: BC Managed Care – PPO | Admitting: Internal Medicine

## 2021-06-17 DIAGNOSIS — M4644 Discitis, unspecified, thoracic region: Secondary | ICD-10-CM | POA: Diagnosis not present

## 2021-06-17 LAB — CULTURE, BLOOD (ROUTINE X 2)
Culture: NO GROWTH
Culture: NO GROWTH
Special Requests: ADEQUATE
Special Requests: ADEQUATE

## 2021-06-17 LAB — COMPREHENSIVE METABOLIC PANEL
ALT: 72 U/L — ABNORMAL HIGH (ref 0–44)
AST: 39 U/L (ref 15–41)
Albumin: 2.3 g/dL — ABNORMAL LOW (ref 3.5–5.0)
Alkaline Phosphatase: 92 U/L (ref 38–126)
Anion gap: 9 (ref 5–15)
BUN: 12 mg/dL (ref 6–20)
CO2: 25 mmol/L (ref 22–32)
Calcium: 8.6 mg/dL — ABNORMAL LOW (ref 8.9–10.3)
Chloride: 98 mmol/L (ref 98–111)
Creatinine, Ser: 0.96 mg/dL (ref 0.61–1.24)
GFR, Estimated: >60 mL/min (ref >60)
Glucose, Bld: 99 mg/dL (ref 70–99)
Potassium: 3.8 mmol/L (ref 3.5–5.1)
Sodium: 132 mmol/L — ABNORMAL LOW (ref 135–145)
Total Bilirubin: 0.7 mg/dL (ref 0.3–1.2)
Total Protein: 6.6 g/dL (ref 6.5–8.1)

## 2021-06-17 NOTE — Plan of Care (Signed)
Pt is alert oriented x 4. Pt refused to fully turn over in bed. Repositioned with pillow once and then later pt asked for it to be removed. Education provided regarding importance of turning in bed. Pt stated he has felt nauseated. Scheduled nausea medicine given and effective. Pt had two frozen icees last night. Pt stated he did not want EGD done. Pt states he feels weak all over.  ? ? ?Problem: Education: ?Goal: Knowledge of General Education information will improve ?Description: Including pain rating scale, medication(s)/side effects and non-pharmacologic comfort measures ?Outcome: Progressing ?  ?Problem: Health Behavior/Discharge Planning: ?Goal: Ability to manage health-related needs will improve ?Outcome: Progressing ?  ?Problem: Clinical Measurements: ?Goal: Ability to maintain clinical measurements within normal limits will improve ?Outcome: Progressing ?Goal: Will remain free from infection ?Outcome: Progressing ?Goal: Diagnostic test results will improve ?Outcome: Progressing ?Goal: Respiratory complications will improve ?Outcome: Progressing ?Goal: Cardiovascular complication will be avoided ?Outcome: Progressing ?  ?Problem: Activity: ?Goal: Risk for activity intolerance will decrease ?Outcome: Progressing ?  ?Problem: Nutrition: ?Goal: Adequate nutrition will be maintained ?Outcome: Progressing ?  ?Problem: Coping: ?Goal: Level of anxiety will decrease ?Outcome: Progressing ?  ?Problem: Elimination: ?Goal: Will not experience complications related to bowel motility ?Outcome: Progressing ?Goal: Will not experience complications related to urinary retention ?Outcome: Progressing ?  ?Problem: Pain Managment: ?Goal: General experience of comfort will improve ?Outcome: Progressing ?  ?Problem: Safety: ?Goal: Ability to remain free from injury will improve ?Outcome: Progressing ?  ?Problem: Skin Integrity: ?Goal: Risk for impaired skin integrity will decrease ?Outcome: Progressing ?  ?

## 2021-06-17 NOTE — Progress Notes (Addendum)
?PROGRESS NOTE ? ? ? ?Michael PlumberJohn R Vertz  ZOX:[id number] DOB: 23-Mar-1960 DOA: 06/11/2021 ?PCP: Lovey NewcomerBoyd, William S, PA  ? ? ?Brief Narrative:  ? ?Michael [name] is a 61 year old male with past medical history significant for essential hypertension, obesity who presented [facility] ED on 4/12 with progressive back pain.  Patient also reports persistent nausea/vomiting; and has been seen at the [facility] ED on multiple occasions for such complaint.  Patient recently hospitalized from 3/17 - 3/22 with findings of T8-9 discitis and E. coli bacteremia following recent dental infection.  Patient was discharged on Ancef to continue through 06/27/2021. ? ?In the ED, WBC count 12.3, hemoglobin 16.2, platelets 309.  Sodium 133, potassium 3.2, chloride 98, CO2 23, glucose 178, BUN 12, creatinine 0.99.  Lipase 41, AST 51, ALT 109.  Total bilirubin 0.8.  CRP 10.2, lactic acid 2.4.  Urinalysis unrevealing.  UDS positive for opiates.  CT abdomen/pelvis with interval worsening of known discitis/hospital-itis at T8-T9 with increased bony erosions and fragmentation of the endplates with increased paravertebral inflammatory changes and soft tissue density, moderate spinal canal stenosis, patchy atelectasis versus infiltrate lung bases, cholelithiasis, stable right adrenal adenoma, degenerative changes lumbar spine with severe spinal canal stenosis L2-3, L4-5.  MR T-spine with substantial progression T8-9 discitis/osteomyelitis since 3/17, eroded endplates with 25% loss of T8 vertebral body height since that time, probable septic facet joints, increased paraspinal phlegmon and can fluid epidural and dural thickening, increased spinal stenosis T8-9 spinal cord mass effect but with no cord edema.  Seen by neurosurgery, recommend medical management, continue IV antibiotics.  Infectious disease consulted.  Hospital service consulted for further evaluation and management of thoracic spine progressive discitis/osteomyelitis. ? ?Assessment & Plan: ?   ?T8-9 discitis/osteomyelitis ?Patient representing to the ED with progressive back pain.  Imaging findings significant for substantial progression of T8-9 discitis/osteomyelitis since 3/17, eroded endplates with 25% loss of T8 vertebral body height since that time, probable septic facet joints, increased paraspinal phlegmon and can fluid epidural and dural thickening, increased spinal stenosis T8-9 spinal cord mass effect but with no cord edema.  CRP elevated 10.2, lactic acid 2.5, WBC count 12.3.  Seen by neurosurgery, recommend medical management, continue IV antibiotics.  ?--Infectious disease following, appreciate assistance ?--Blood cultures x2: No growth x4 days ?--S/p T8-9 disc space aspiration by IR 4/14, culture with no growth x3 days ?--Toradol 50 mg IV every 8 hours x5 days ?--Oxycodone 5 mg every 4 hours PRN moderate pain ?-- Dilaudid 0.5 mg IV every 3 hours as needed severe breakthrough pain ?--Ceftriaxone 2 g IV every 24 hours ? ?Nausea/vomiting ?Patient with episodic nausea/vomiting, seen in the ED several times.  CT abdomen/pelvis with no bowel obstruction, no increased stool burden with notable small gallstones.  Etiology likely secondary to esophageal candidiasis with known current oral thrush versus atypical presentation of gallstones. ?--Seen by gastroenterology, offered EGD but patient declined.  GI now on standby. ?--Protonix 40 mg IV q12h ?--Full liquid diet, advance as tolerates ?--Supportive care, antiemetics ? ?Elevated LFTs ?Etiology likely secondary to progressive infection from osteomyelitis versus medication effect with ceftriaxone.  CK level within normal limits, hepatitis panel negative. ?--AST 51>>34>51>39 ?--ALT 109>>64>79>72 ?--Tbili  ?--continue to trend LFTs ? ?Essential hypertension ?Home regimen inclueds lisinopril-HCTZ 10-12.5 mg p.o. daily. ?--Holding home antihypertensives currently due to borderline hypotension ?--Monitor BP closely ? ?Oral thrush ?--Nystatin twice  daily ? ?Dental pain ?DG orthopantogram with multiple missing teeth with dental caries and periapical lucencies. ?--Outpatient follow-up with dentist ? ?Physical  deconditioning ?--PT/OT evaluation: Pending ? ?Morbid obesity ?Body mass index is 34.87 kg/m?Marland Kitchen  Discussed with patient needs for aggressive lifestyle changes/weight loss as this complicates all facets of care.  Outpatient follow-up with PCP.   ? ? ? ? ?DVT prophylaxis: Place and maintain sequential compression device Start: 06/14/21 1223 ?SCDs Start: 06/12/21 1059 ? ?  Code Status: Full Code ?Family Communication: No family present at bedside ? ?Disposition Plan:  ?Level of care: Med-Surg ?Status is: Inpatient ?Remains inpatient appropriate because: Awaiting PT/OT evaluation, may need SNF placement ?  ? ?Consultants:  ?Neurosurgery, Dr. Jake Samples ?Infectious disease ?[facility] - on standby as of 4/17 as as patient refused EGD ? ?Procedures:  ?T8-9 aspiration by IR 4/14 ? ?Antimicrobials:  ?Ceftriaxone 4/1>> ? ? ?Subjective: ?Patient seen and examined at bedside, lying in bed.  Continues with only liquid diet, does not want to advance any further.  Declined EGD yesterday.  Patient also states not ready for discharge home, as he feels weak and unable to care for himself.  Awaiting PT/OT evaluation.  Discussed with patient regarding possible need of SNF placement, he seems amenable if that is what is required.  No other specific questions or concerns at this time.  Denies headache, no visual changes, no fever/chills/night sweats, no vomiting/diarrhea, no weakness, no fatigue, no paresthesias.  No acute events overnight per nursing staff. ? ?Objective: ?Vitals:  ? 06/16/21 2218 06/17/21 0334 06/17/21 0837 06/17/21 1156  ?BP: (!) 138/100 (!) 140/92 (!) 148/98 104/76  ?Pulse: 80 89 91 89  ?Resp: 20 16 18 18   ?Temp: 98.5 ?F (36.9 ?C) 98.2 ?F (36.8 ?C) 98.4 ?F (36.9 ?C) 98.2 ?F (36.8 ?C)  ?TempSrc: Oral Oral Oral Oral  ?SpO2: 95% 96% 95% 94%  ?Weight:      ?Height:       ? ? ?Intake/Output Summary (Last 24 hours) at 06/17/2021 1235 ?Last data filed at 06/17/2021 06/19/2021 ?Gross per 24 hour  ?Intake 2634.21 ml  ?Output 450 ml  ?Net 2184.21 ml  ? ?Filed Weights  ? 06/11/21 2219  ?Weight: 113.4 kg  ? ? ?Examination: ? ?Physical Exam: ?GEN: NAD, alert and oriented x 3, chronically ill appearance, appears older than stated age ?HEENT: NCAT, PERRL, EOMI, sclera clear, MMM, poor dentition ?PULM: CTAB w/o wheezes/crackles, normal respiratory effort, on room air ?CV: RRR w/o M/G/R ?GI: abd soft, NTND, NABS, no R/G/M ?MSK: no peripheral edema, muscle strength globally intact 5/5 bilateral upper/lower extremities, PICC line noted right upper extremity ?NEURO: CN II-XII intact, no focal deficits, sensation to light touch intact ?PSYCH: normal mood/affect ?Integumentary: dry/intact, no rashes or wounds ? ? ? ?Data Reviewed: I have personally reviewed following labs and imaging studies ? ?CBC: ?Recent Labs  ?Lab 06/11/21 ?2257 06/14/21 ?0154  ?WBC 12.3* 9.3  ?NEUTROABS 9.1* 6.1  ?HGB 16.2 13.2  ?HCT 49.4 40.7  ?MCV 89.0 89.1  ?PLT 309 268  ? ?Basic Metabolic Panel: ?Recent Labs  ?Lab 06/11/21 ?2257 06/14/21 ?0154 06/15/21 ?0248 06/16/21 ?06/18/21 06/17/21 ?06/19/21  ?NA 133* 134* 133* 132* 132*  ?K 3.2* 3.4* 3.7 4.0 3.8  ?CL 98 101 100 100 98  ?CO2 23 26 23  21* 25  ?GLUCOSE 178* 113* 101* 162* 99  ?BUN 12 7 6 10 12   ?CREATININE 0.99 0.87 0.87 1.04 0.96  ?CALCIUM 9.3 8.7* 8.8* 8.9 8.6*  ?MG  --  2.0  --   --   --   ?PHOS  --   --   --  2.8  --   ? ?  GFR: ?Estimated Creatinine Clearance: 106.1 mL/min (by C-G formula based on SCr of 0.96 mg/dL). ?Liver Function Tests: ?Recent Labs  ?Lab 06/11/21 ?2257 06/14/21 ?0154 06/15/21 ?0248 06/16/21 ?5726 06/17/21 ?2035  ?AST 51* 27 34 51* 39  ?ALT 109* 64* 64* 79* 72*  ?ALKPHOS 108 91 90 97 92  ?BILITOT 0.8 0.9 0.9 0.9 0.7  ?PROT 7.8 6.5 6.8 7.5 6.6  ?ALBUMIN 2.8* 2.4* 2.4* 2.6* 2.3*  ? ?Recent Labs  ?Lab 06/14/21 ?0154  ?LIPASE 41  ? ?No results for input(s): AMMONIA  in the last 168 hours. ?Coagulation Profile: ?Recent Labs  ?Lab 06/11/21 ?2257  ?INR 1.4*  ? ?Cardiac Enzymes: ?Recent Labs  ?Lab 06/15/21 ?0248  ?CKTOTAL 24*  ? ?BNP (last 3 results) ?No results for input(s):

## 2021-06-17 NOTE — Plan of Care (Signed)
  Problem: Education: Goal: Knowledge of General Education information will improve Description: Including pain rating scale, medication(s)/side effects and non-pharmacologic comfort measures Outcome: Progressing   Problem: Health Behavior/Discharge Planning: Goal: Ability to manage health-related needs will improve Outcome: Progressing   Problem: Clinical Measurements: Goal: Ability to maintain clinical measurements within normal limits will improve Outcome: Progressing Goal: Will remain free from infection Outcome: Progressing Goal: Diagnostic test results will improve Outcome: Progressing Goal: Respiratory complications will improve Outcome: Progressing Goal: Cardiovascular complication will be avoided Outcome: Progressing   Problem: Nutrition: Goal: Adequate nutrition will be maintained Outcome: Progressing   Problem: Coping: Goal: Level of anxiety will decrease Outcome: Progressing   Problem: Elimination: Goal: Will not experience complications related to urinary retention Outcome: Progressing   Problem: Pain Managment: Goal: General experience of comfort will improve Outcome: Progressing   Problem: Safety: Goal: Ability to remain free from injury will improve Outcome: Progressing   Problem: Skin Integrity: Goal: Risk for impaired skin integrity will decrease Outcome: Progressing   

## 2021-06-17 NOTE — Progress Notes (Signed)
?   ? ? ? ? ?[facility] ? ?Date of Admission:  06/11/2021   Total days of inpatient antibiotics 4 ? ?Principal Problem: ?  Thoracic discitis ?Active Problems: ?  Essential hypertension ?  Class 1 obesity due to excess calories with body mass index (BMI) of 34.0 to 34.9 in adult ?  Oral thrush ?  Poor dentition ?     ?    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 ?  ?#Progressing T8-T9 osteomyelitis with increasing paraspinal phlegmon with Ecoli ?#SP IR aspiration on 4/14 with negative Cx ?-3/17 blood Cx+ Ecoli, 3/19 Aspirate Cx+ Ecoli. ?-Abx: cefazolin 3/21-3/31(N/V)->ceftriaxone 4/1-p(EOT 4/28 to complete 8 weeks of antibiotics).  ?-CT AP showed chololithiasis, possible infiltrate vs atelectasis lung base. Pt has a chronic cough per wife and is on RA, favor atelectatics.  ?-MRI shows worsening  findings of osteo/phlegmon and back pain increased on appropriate antibiotics. NSY engaged and recommend IR aspiration. IR aspiration from 4/14 Ngx3 days.  ?Recommendations:  ?-Pt repots he does not think he is ready to go home as he can't take care of himself. PT/OT eval pending. ?-Continue ceftriaxone. As remain negative will continue current course of IV antibiotics which are due to end 4/28.  ?-Follow up with Dr. Gale Journey rescheduled to 4/26. I spoke with pt today and following completion of IV antibiotics we can add an additional 2 weeks of cefadroxil and consider re-imaging spine at that point ? ?#RUQ pain ?#Mildly elevated LFTs ?GI engaged and noted LEFT increase more likely due to infection and treatment that stones.  ?Ceftriaxone is associated with increased LFTs. Given mild increase, will monitor for now.  ? ? ? ?Microbiology:   ?Antibiotics: ?Ceftriaxone 4/1-p ?Prior to hospitalization ?Cefazolin 3/21-3/31 ?3/17-3/20 vanc and ctx ?Cultures: ?Blood ?4/12 p ?Urine ?4/12 p ? ?SUBJECTIVE: ?Resting  in bed. Reports he is not ready to go home.  ? ?Review of Systems: ?Review of Systems  ?All other systems reviewed and are negative. ? ? ?Scheduled Meds: ? bisacodyl  10 mg Rectal Once  ? Chlorhexidine Gluconate Cloth  6 each Topical Daily  ? feeding supplement  1 Container Oral TID BM  ? feeding supplement  237 mL Oral BID BM  ? ketorolac  15 mg Intravenous Q8H  ? metoprolol tartrate  2.5 mg Intravenous Q8H  ? nystatin  5 mL Mouth/Throat BID  ? ondansetron  4 mg Oral Q8H  ? Or  ? ondansetron (ZOFRAN) IV  4 mg Intravenous Q8H  ? pantoprazole (PROTONIX) IV  40 mg Intravenous Q12H  ? sodium chloride flush  10-40 mL Intracatheter Q12H  ? sodium chloride flush  3 mL Intravenous Q12H  ? ?Continuous Infusions: ? cefTRIAXone (ROCEPHIN)  IV 2 g (06/17/21 1021)  ? lactated ringers 75 mL/hr at 06/16/21 1528  ? ?PRN Meds:.acetaminophen **OR** acetaminophen, bisacodyl, diphenhydrAMINE, hydrALAZINE, HYDROmorphone (DILAUDID) injection, oxyCODONE, polyethylene glycol, sodium chloride flush ?No Known Allergies ? ?OBJECTIVE: ?Vitals:  ? 06/16/21 1446 06/16/21 2218 06/17/21 0334 06/17/21 0837  ?BP: (!) 126/91 (!) 138/100 (!) 140/92 (!) 148/98  ?Pulse: 95 80 89 91  ?Resp: (!) 21 20 16 18   ?Temp: 98 ?F (36.7 ?C) 98.5 ?F (36.9 ?C) 98.2 ?F (36.8 ?C) 98.4 ?F (36.9 ?C)  ?TempSrc: Axillary Oral Oral Oral  ?SpO2: 94% 95% 96% 95%  ?Weight:      ?Height:      ? ?Body mass index is 34.87 kg/m?. ? ?Physical  Exam ?Constitutional:   ?   General: He is not in acute distress. ?   Appearance: He is normal weight. He is not toxic-appearing.  ?HENT:  ?   Head: Normocephalic and atraumatic.  ?   Right Ear: External ear normal.  ?   Left Ear: External ear normal.  ?   Nose: No congestion or rhinorrhea.  ?   Mouth/Throat:  ?   Mouth: Mucous membranes are moist.  ?   Pharynx: Oropharynx is clear.  ?Eyes:  ?   Extraocular Movements: Extraocular movements intact.  ?   Conjunctiva/sclera: Conjunctivae normal.  ?   Pupils: Pupils are equal, round, and reactive  to light.  ?Cardiovascular:  ?   Rate and Rhythm: Normal rate and regular rhythm.  ?   Heart sounds: No murmur heard. ?  No friction rub. No gallop.  ?Pulmonary:  ?   Effort: Pulmonary effort is normal.  ?   Breath sounds: Normal breath sounds.  ?Abdominal:  ?   General: Abdomen is flat. Bowel sounds are normal.  ?   Palpations: Abdomen is soft.  ?Musculoskeletal:     ?   General: No swelling. Normal range of motion.  ?   Cervical back: Normal range of motion and neck supple.  ?Skin: ?   General: Skin is warm and dry.  ?Neurological:  ?   General: No focal deficit present.  ?   Mental Status: He is oriented to person, place, and time.  ?Psychiatric:     ?   Mood and Affect: Mood normal.  ? ? ? ? ?Lab Results ?Lab Results  ?Component Value Date  ? WBC 9.3 06/14/2021  ? HGB 13.2 06/14/2021  ? HCT 40.7 06/14/2021  ? MCV 89.1 06/14/2021  ? PLT 268 06/14/2021  ?  ?Lab Results  ?Component Value Date  ? CREATININE 0.96 06/17/2021  ? BUN 12 06/17/2021  ? NA 132 (L) 06/17/2021  ? K 3.8 06/17/2021  ? CL 98 06/17/2021  ? CO2 25 06/17/2021  ?  ?Lab Results  ?Component Value Date  ? ALT 72 (H) 06/17/2021  ? AST 39 06/17/2021  ? ALKPHOS 92 06/17/2021  ? BILITOT 0.7 06/17/2021  ?  ? ? ? ? ?Laurice Record, MD ?[facility] ?[facility] ?06/17/2021, 10:51 AM  ?

## 2021-06-18 DIAGNOSIS — I10 Essential (primary) hypertension: Secondary | ICD-10-CM | POA: Diagnosis not present

## 2021-06-18 DIAGNOSIS — R112 Nausea with vomiting, unspecified: Secondary | ICD-10-CM

## 2021-06-18 DIAGNOSIS — E6609 Other obesity due to excess calories: Secondary | ICD-10-CM | POA: Diagnosis not present

## 2021-06-18 DIAGNOSIS — R7989 Other specified abnormal findings of blood chemistry: Secondary | ICD-10-CM

## 2021-06-18 DIAGNOSIS — B370 Candidal stomatitis: Secondary | ICD-10-CM

## 2021-06-18 DIAGNOSIS — M4644 Discitis, unspecified, thoracic region: Secondary | ICD-10-CM | POA: Diagnosis not present

## 2021-06-18 DIAGNOSIS — Z6834 Body mass index (BMI) 34.0-34.9, adult: Secondary | ICD-10-CM

## 2021-06-18 LAB — CBC
HCT: 40 % (ref 39.0–52.0)
Hemoglobin: 13.1 g/dL (ref 13.0–17.0)
MCH: 29 pg (ref 26.0–34.0)
MCHC: 32.8 g/dL (ref 30.0–36.0)
MCV: 88.5 fL (ref 80.0–100.0)
Platelets: 236 10*3/uL (ref 150–400)
RBC: 4.52 MIL/uL (ref 4.22–5.81)
RDW: 14.1 % (ref 11.5–15.5)
WBC: 7.6 10*3/uL (ref 4.0–10.5)
nRBC: 0 % (ref 0.0–0.2)

## 2021-06-18 LAB — COMPREHENSIVE METABOLIC PANEL
ALT: 84 U/L — ABNORMAL HIGH (ref 0–44)
AST: 47 U/L — ABNORMAL HIGH (ref 15–41)
Albumin: 2.3 g/dL — ABNORMAL LOW (ref 3.5–5.0)
Alkaline Phosphatase: 90 U/L (ref 38–126)
Anion gap: 8 (ref 5–15)
BUN: 11 mg/dL (ref 6–20)
CO2: 24 mmol/L (ref 22–32)
Calcium: 8.4 mg/dL — ABNORMAL LOW (ref 8.9–10.3)
Chloride: 101 mmol/L (ref 98–111)
Creatinine, Ser: 0.94 mg/dL (ref 0.61–1.24)
GFR, Estimated: >60 mL/min (ref >60)
Glucose, Bld: 103 mg/dL — ABNORMAL HIGH (ref 70–99)
Potassium: 3.8 mmol/L (ref 3.5–5.1)
Sodium: 133 mmol/L — ABNORMAL LOW (ref 135–145)
Total Bilirubin: 0.7 mg/dL (ref 0.3–1.2)
Total Protein: 6.5 g/dL (ref 6.5–8.1)

## 2021-06-18 LAB — AEROBIC/ANAEROBIC CULTURE W GRAM STAIN (SURGICAL/DEEP WOUND): Culture: NO GROWTH

## 2021-06-18 NOTE — Plan of Care (Signed)
  Problem: Education: Goal: Knowledge of General Education information will improve Description: Including pain rating scale, medication(s)/side effects and non-pharmacologic comfort measures Outcome: Progressing   Problem: Health Behavior/Discharge Planning: Goal: Ability to manage health-related needs will improve Outcome: Progressing   Problem: Clinical Measurements: Goal: Ability to maintain clinical measurements within normal limits will improve Outcome: Progressing Goal: Will remain free from infection Outcome: Progressing Goal: Diagnostic test results will improve Outcome: Progressing Goal: Respiratory complications will improve Outcome: Progressing Goal: Cardiovascular complication will be avoided Outcome: Progressing   Problem: Activity: Goal: Risk for activity intolerance will decrease Outcome: Progressing   Problem: Nutrition: Goal: Adequate nutrition will be maintained Outcome: Progressing   Problem: Coping: Goal: Level of anxiety will decrease Outcome: Progressing   Problem: Elimination: Goal: Will not experience complications related to urinary retention Outcome: Progressing   Problem: Safety: Goal: Ability to remain free from injury will improve Outcome: Progressing   Problem: Skin Integrity: Goal: Risk for impaired skin integrity will decrease Outcome: Progressing   

## 2021-06-18 NOTE — [place] FL2 (Signed)
?[place] MEDICAID FL2 LEVEL OF CARE SCREENING TOOL  ?  ? ?IDENTIFICATION  ?Patient Name: ?Michael [name] Birthdate: Jul 07, 1960 Sex: male Admission Date (Current Location): ?06/11/2021  ?[place] Number: ? [place] ?  Facility and Address:  ?[facility] [facility], [address] [place], [place] [address] ?     Provider Number: ?[id number]  ?Attending Physician Name and Address:  ?Marguerita Merles [place], DO ? Relative Name and Phone Number:  ?  ?   ?Current Level of Care: ?Hospital Recommended Level of Care: ?Skilled Nursing Facility Prior Approval Number: ?  ? ?Date Approved/Denied: ?  PASRR Number: ?[id number] A ? ?Discharge Plan: ?SNF ?  ? ?Current Diagnoses: ?Patient Active Problem List  ? Diagnosis Date Noted  ? Thoracic discitis 06/12/2021  ? Class 1 obesity due to excess calories with body mass index (BMI) of 34.0 to 34.9 in adult 06/12/2021  ? Oral thrush 06/12/2021  ? Poor dentition 06/12/2021  ? Gram-negative bacteremia 05/17/2021  ? Osteomyelitis (HCC) 05/16/2021  ? Essential hypertension 05/16/2021  ? Obesity, Class III, BMI 40-49.9 (morbid obesity) (HCC) 05/16/2021  ? ? ?Orientation RESPIRATION BLADDER Height & Weight   ?  ?Self, Time, Situation, Place ? Normal Continent Weight: 250 lb (113.4 kg) ?Height:  5\' 11"  (180.3 cm)  ?BEHAVIORAL SYMPTOMS/MOOD NEUROLOGICAL BOWEL NUTRITION STATUS  ?    Continent Diet (see DC summary)  ?AMBULATORY STATUS COMMUNICATION OF NEEDS Skin   ?Extensive Assist Verbally Surgical wounds ?  ?  ?  ?    ?     ?     ? ? ?Personal Care Assistance Level of Assistance  ?Bathing, Feeding, Dressing Bathing Assistance: Limited assistance ?Feeding assistance: Independent ?Dressing Assistance: Limited assistance ?   ? ?Functional Limitations Info  ?    ?  ?   ? ? ?SPECIAL CARE FACTORS FREQUENCY  ?PT (By licensed PT), OT (By licensed OT)   ?  ?PT Frequency: 5x/wk ?OT Frequency: 5x/wk ?  ?  ?  ?   ? ? ?Contractures Contractures Info: Not present  ? ? ?Additional  Factors Info  ?Code Status, Allergies Code Status Info: Full ?Allergies Info: NKA ?  ?  ?  ?   ? ?Current Medications (06/18/2021):  This is the current hospital active medication list ?Current Facility-Administered Medications  ?Medication Dose Route Frequency Provider Last Rate Last Admin  ? acetaminophen (TYLENOL) tablet 650 mg  650 mg Oral Q6H PRN 06/20/2021, MD   650 mg at 06/17/21 1256  ? Or  ? acetaminophen (TYLENOL) suppository 650 mg  650 mg Rectal Q6H PRN 06/19/21, MD      ? bisacodyl (DULCOLAX) EC tablet 5 mg  5 mg Oral Daily PRN Jonah Blue, MD      ? bisacodyl (DULCOLAX) suppository 10 mg  10 mg Rectal Once Jonah Blue, NP      ? cefTRIAXone (ROCEPHIN) 2 g in sodium chloride 0.9 % 100 mL IVPB  2 g Intravenous Q24H Arnaldo Natal, MD 200 mL/hr at 06/18/21 1003 2 g at 06/18/21 1003  ? Chlorhexidine Gluconate Cloth 2 % PADS 6 each  6 each Topical Daily 06/20/21, MD   6 each at 06/18/21 1000  ? diphenhydrAMINE (BENADRYL) injection 12.5 mg  12.5 mg Intravenous Q8H PRN 06/20/21, MD   12.5 mg at 06/17/21 2251  ? feeding supplement (BOOST / RESOURCE BREEZE) liquid 1 Container  1 Container Oral TID BM Esterwood, Amy S, PA-C   1 Container at  06/17/21 1021  ? feeding supplement (ENSURE ENLIVE / ENSURE PLUS) liquid 237 mL  237 mL Oral BID BM Albertine Grates, MD   237 mL at 06/17/21 1021  ? hydrALAZINE (APRESOLINE) injection 5 mg  5 mg Intravenous Q4H PRN Jonah Blue, MD      ? HYDROmorphone (DILAUDID) injection 0.5 mg  0.5 mg Intravenous Q3H PRN [place], Alvira Philips, DO   0.5 mg at 06/18/21 0948  ? ketorolac (TORADOL) 15 MG/ML injection 15 mg  15 mg Intravenous Q8H [place], Alvira Philips, DO   15 mg at 06/18/21 5170  ? metoprolol tartrate (LOPRESSOR) injection 2.5 mg  2.5 mg Intravenous Janit Bern, MD   2.5 mg at 06/18/21 0511  ? nystatin (MYCOSTATIN) 100000 UNIT/ML suspension 500,000 Units  5 mL Mouth/Throat BID Albertine Grates, MD   500,000 Units at 06/18/21 0954  ? ondansetron (ZOFRAN) tablet 4  mg  4 mg Oral Q8H Esterwood, Amy S, PA-C      ? Or  ? ondansetron (ZOFRAN) injection 4 mg  4 mg Intravenous Q8H Esterwood, Amy S, PA-C   4 mg at 06/18/21 0511  ? oxyCODONE (Oxy IR/ROXICODONE) immediate release tablet 5 mg  5 mg Oral Q4H PRN Albertine Grates, MD      ? pantoprazole (PROTONIX) injection 40 mg  40 mg Intravenous Q12H Esterwood, Amy S, PA-C   40 mg at 06/18/21 0954  ? polyethylene glycol (MIRALAX / GLYCOLAX) packet 17 g  17 g Oral Daily PRN Jonah Blue, MD      ? sodium chloride flush (NS) 0.9 % injection 10-40 mL  10-40 mL Intracatheter Q12H Dawley, Troy C, DO   10 mL at 06/18/21 1144  ? sodium chloride flush (NS) 0.9 % injection 10-40 mL  10-40 mL Intracatheter PRN Dawley, Troy C, DO      ? sodium chloride flush (NS) 0.9 % injection 3 mL  3 mL Intravenous Q12H Jonah Blue, MD   3 mL at 06/17/21 1021  ? ? ? ?Discharge Medications: ?Please see discharge summary for a list of discharge medications. ? ?Relevant Imaging Results: ? ?Relevant Lab Results: ? ? ?Additional Information ?SS#: [id number] ? ?Baldemar Lenis, LCSW ? ? ? ? ?

## 2021-06-18 NOTE — Hospital Course (Addendum)
The patient. Michael [name] is a 61 year old male with past medical history significant for essential hypertension, obesity who presented to [facility] ED on 4/12 with progressive back pain. Patient also reports persistent nausea/vomiting; and has been seen at the [facility], ED on multiple occasions for such complaint.  Patient recently hospitalized from 3/17 - 3/22 with findings of T8-9 discitis and E. coli bacteremia following recent dental infection.  Patient was discharged on Ancef to continue through 06/27/2021. ?  ?In the ED, WBC count 12.3, hemoglobin 16.2, platelets 309.  Sodium 133, potassium 3.2, chloride 98, CO2 23, glucose 178, BUN 12, creatinine 0.99.  Lipase 41, AST 51, ALT 109.  Total bilirubin 0.8.  CRP 10.2, lactic acid 2.4.  Urinalysis unrevealing.  UDS positive for opiates.  CT abdomen/pelvis with interval worsening of known discitis/hospital-itis at T8-T9 with increased bony erosions and fragmentation of the endplates with increased paravertebral inflammatory changes and soft tissue density, moderate spinal canal stenosis, patchy atelectasis versus infiltrate lung bases, cholelithiasis, stable right adrenal adenoma, degenerative changes lumbar spine with severe spinal canal stenosis L2-3, L4-5.  MR T-spine with substantial progression T8-9 discitis/osteomyelitis since 3/17, eroded endplates with 25% loss of T8 vertebral body height since that time, probable septic facet joints, increased paraspinal phlegmon and can fluid epidural and dural thickening, increased spinal stenosis T8-9 spinal cord mass effect but with no cord edema.   ? ?Seen by neurosurgery, recommend medical management, continue IV antibiotics.  Infectious disease consulted and recommending continuing IV Ceftriaxone until 06/27/21.  ?Given his Nausea and Vomiting, GI was consulted and offered EGD but patient refused and recommending Scheduled Zofran and PPI BID.  ?PT OT evaluated and recommended CIR but patient did not demonstrate the  ability for intensive rehab required to admit to CIR so now recommendations for him go to SNF.  ?

## 2021-06-18 NOTE — Progress Notes (Signed)
  Inpatient Rehab Admissions Coordinator :  Per OT therapy recommendations, patient was screened for CIR candidacy by Etha Stambaugh RN MSN.  At this time patient appears to be a potential candidate for CIR. I will place a rehab consult per protocol for full assessment. Please call me with any questions.  Traveion Ruddock RN MSN Admissions Coordinator [phone]   

## 2021-06-18 NOTE — Evaluation (Signed)
Occupational Therapy Evaluation ?Patient Details ?Name: Michael [name] ?MRN: [id number] ?DOB: 1960/07/21 ?Today's Date: 06/18/2021 ? ? ?History of Present Illness Michael [name] is a 61 y.o. male who presented to ED c/o back pain x 2 weeks. Pt was admitted for T8-T9 osteomyelitis. PMH: HTN, obesity, GERD  ? ?Clinical Impression ?  ?Pt admitted for concerns listed above. PTA Pt reported that he was independent with all ADL's and IADL's, including working as a Naval architect. At this time, pt is limited by pain and weakness, requiring min-mod A for all mobility and ADL's. He requires max encouragement to participate and continues to be limited by pain. Recommending AIR to maximize his independence and safety. OT Will follow acutely.  ?   ? ?Recommendations for follow up therapy are one component of a multi-disciplinary discharge planning process, led by the attending physician.  Recommendations may be updated based on patient status, additional functional criteria and insurance authorization.  ? ?Follow Up Recommendations ? Acute inpatient rehab (3hours/day)  ?  ?Assistance Recommended at Discharge Frequent or constant Supervision/Assistance  ?Patient can return home with the following A lot of help with walking and/or transfers;A lot of help with bathing/dressing/bathroom;Assistance with cooking/housework;Assist for transportation;Help with stairs or ramp for entrance ? ?  ?Functional Status Assessment ? Patient has had a recent decline in their functional status and demonstrates the ability to make significant improvements in function in a reasonable and predictable amount of time.  ?Equipment Recommendations ? BSC/3in1  ?  ?Recommendations for Other Services Rehab consult ? ? ?  ?Precautions / Restrictions Precautions ?Precautions: Fall ?Restrictions ?Weight Bearing Restrictions: No ?Other Position/Activity Restrictions: self limiting due to back pain  ? ?  ? ?Mobility Bed Mobility ?Overal bed mobility: Needs  Assistance ?Bed Mobility: Rolling, Sidelying to Sit, Sit to Sidelying ?Rolling: Supervision ?Sidelying to sit: Min guard ?  ?  ?Sit to sidelying: Min assist ?General bed mobility comments: No assist to sit, however min A to return BLE to bed. ?  ? ?Transfers ?  ?  ?  ?  ?  ?  ?  ?  ?  ?General transfer comment: Refused due to pain ?  ? ?  ?Balance Overall balance assessment: Mild deficits observed, not formally tested ?  ?  ?  ?  ?  ?  ?  ?  ?  ?  ?  ?  ?  ?  ?  ?  ?  ?  ?   ? ?ADL either performed or assessed with clinical judgement  ? ?ADL Overall ADL's : Needs assistance/impaired ?  ?  ?  ?  ?  ?  ?  ?  ?  ?  ?  ?  ?  ?  ?  ?  ?  ?  ?  ?General ADL Comments: At this time, pt is requiring mod-max A due to self limiting behaviors, back pain, and weakness  ? ? ? ?Vision Baseline Vision/History: 1 Wears glasses ?Ability to See in Adequate Light: 0 Adequate ?Patient Visual Report: No change from baseline ?Vision Assessment?: No apparent visual deficits  ?   ?Perception   ?  ?Praxis   ?  ? ?Pertinent Vitals/Pain Pain Assessment ?Pain Assessment: Faces ?Faces Pain Scale: Hurts even more ?Pain Location: mid back ?Pain Descriptors / Indicators: Sore  ? ? ? ?Hand Dominance Left ?  ?Extremity/Trunk Assessment Upper Extremity Assessment ?Upper Extremity Assessment: Generalized weakness ?  ?Lower Extremity Assessment ?Lower Extremity Assessment: Defer to PT evaluation ?  ?  Cervical / Trunk Assessment ?Cervical / Trunk Assessment: Other exceptions ?Cervical / Trunk Exceptions: mid back pain ?  ?Communication Communication ?Communication: No difficulties ?  ?Cognition Arousal/Alertness: Awake/alert ?Behavior During Therapy: Flat affect ?Overall Cognitive Status: Within Functional Limits for tasks assessed ?  ?  ?  ?  ?  ?  ?  ?  ?  ?  ?  ?  ?  ?  ?  ?  ?General Comments: pt appears to be in depressed mood and relies on mom despite being capable to mobilize and perform ADLs on own. ?  ?  ?General Comments  VSS on RA ? ?   ?Exercises   ?  ?Shoulder Instructions    ? ? ?Home Living Family/patient expects to be discharged to:: Private residence ?Living Arrangements: Spouse/significant other ?Available Help at Discharge: Family;Available PRN/intermittently ?Type of Home: House ?Home Access: Stairs to enter ?Entrance Stairs-Number of Steps: 7 ?Entrance Stairs-Rails: Can reach both ?Home Layout: One level ?  ?  ?Bathroom Shower/Tub: Psychologist, counsellingWalk-in shower;Tub/shower unit ?  ?Bathroom Toilet: Standard ?Bathroom Accessibility: Yes ?How Accessible: Accessible via walker ?Home Equipment: Agricultural consultantolling Walker (2 wheels);Shower seat - built in ?  ?  ?  ? ?  ?Prior Functioning/Environment Prior Level of Function : Working/employed;Independent/Modified Independent ?  ?  ?  ?  ?  ?  ?Mobility Comments: was amb without AD up until re-admit to hospital ?ADLs Comments: wife was assisting alot but due to self limitation ?  ? ?  ?  ?OT Problem List: Decreased strength;Decreased range of motion;Decreased activity tolerance;Impaired balance (sitting and/or standing);Decreased knowledge of use of DME or AE;Decreased knowledge of precautions;Obesity;Impaired UE functional use;Pain ?  ?   ?OT Treatment/Interventions: Self-care/ADL training;Therapeutic exercise;Energy conservation;DME and/or AE instruction;Therapeutic activities;Patient/family education;Balance training  ?  ?OT Goals(Current goals can be found in the care plan section) Acute Rehab OT Goals ?Patient Stated Goal: To relieve pain ?OT Goal Formulation: With patient ?Time For Goal Achievement: 07/02/21 ?Potential to Achieve Goals: Good ?ADL Goals ?Pt Will Perform Grooming: with modified independence;standing ?Pt Will Perform Lower Body Bathing: with modified independence;sitting/lateral leans;sit to/from stand;with adaptive equipment ?Pt Will Perform Lower Body Dressing: with modified independence;with adaptive equipment;sitting/lateral leans;sit to/from stand ?Pt Will Transfer to Toilet: with modified  independence;stand pivot transfer ?Pt Will Perform Toileting - Clothing Manipulation and hygiene: with modified independence;sitting/lateral leans;sit to/from stand  ?OT Frequency: Min 2X/week ?  ? ?Co-evaluation   ?  ?  ?  ?  ? ?  ?AM-PAC OT "6 Clicks" Daily Activity     ?Outcome Measure Help from another person eating meals?: A Little ?Help from another person taking care of personal grooming?: A Little ?Help from another person toileting, which includes using toliet, bedpan, or urinal?: A Lot ?Help from another person bathing (including washing, rinsing, drying)?: A Lot ?Help from another person to put on and taking off regular upper body clothing?: A Little ?Help from another person to put on and taking off regular lower body clothing?: A Lot ?6 Click Score: 15 ?  ?End of Session Nurse Communication: Mobility status ? ?Activity Tolerance: Patient limited by pain ?Patient left: in bed;with call bell/phone within reach;with family/visitor present;with bed alarm set ? ?OT Visit Diagnosis: Unsteadiness on feet (R26.81);Other abnormalities of gait and mobility (R26.89);Muscle weakness (generalized) (M62.81)  ?              ?Time: [id number] ?OT Time Calculation (min): 39 min ?Charges:  OT General Charges ?$OT Visit: 1 Visit ?OT Evaluation ?$OT Eval  Moderate Complexity: 1 Mod ?OT Treatments ?$Self Care/Home Management : 8-22 mins ?$Therapeutic Activity: 8-22 mins ? ?Yvan Dority H., OTR/L ?Acute Rehabilitation ? ?Annalissa Murphey Elane Bing Plume ?06/18/2021, 7:23 PM ?

## 2021-06-18 NOTE — Plan of Care (Signed)
Pt is alert oriented x4. Pt is RA, pt has condom cath in place for urine output. Pt c/o back pain. Pt agreed to allow nurse to turn pt in bed. Pt c/o back pain, prn dialudid given. Pt stated pain is much improved. Pt encouraged to move around in bed and turn Q2.  ? ? ?Problem: Education: ?Goal: Knowledge of General Education information will improve ?Description: Including pain rating scale, medication(s)/side effects and non-pharmacologic comfort measures ?Outcome: Not Progressing ?  ?Problem: Health Behavior/Discharge Planning: ?Goal: Ability to manage health-related needs will improve ?Outcome: Not Progressing ?  ?Problem: Clinical Measurements: ?Goal: Ability to maintain clinical measurements within normal limits will improve ?Outcome: Not Progressing ?Goal: Will remain free from infection ?Outcome: Not Progressing ?Goal: Diagnostic test results will improve ?Outcome: Not Progressing ?Goal: Respiratory complications will improve ?Outcome: Not Progressing ?Goal: Cardiovascular complication will be avoided ?Outcome: Not Progressing ?  ?Problem: Activity: ?Goal: Risk for activity intolerance will decrease ?Outcome: Not Progressing ?  ?Problem: Nutrition: ?Goal: Adequate nutrition will be maintained ?Outcome: Not Progressing ?  ?Problem: Coping: ?Goal: Level of anxiety will decrease ?Outcome: Not Progressing ?  ?Problem: Elimination: ?Goal: Will not experience complications related to bowel motility ?Outcome: Not Progressing ?Goal: Will not experience complications related to urinary retention ?Outcome: Not Progressing ?  ?Problem: Pain Managment: ?Goal: General experience of comfort will improve ?Outcome: Not Progressing ?  ?Problem: Safety: ?Goal: Ability to remain free from injury will improve ?Outcome: Not Progressing ?  ?Problem: Skin Integrity: ?Goal: Risk for impaired skin integrity will decrease ?Outcome: Not Progressing ?  ?

## 2021-06-18 NOTE — Evaluation (Signed)
Physical Therapy Evaluation ?Patient Details ?Name: Michael [name] ?MRN: [id number] ?DOB: 02-Aug-1960 ?Today's Date: 06/18/2021 ? ?History of Present Illness ? Michael [name] is a 61 y.o. male who presented to ED c/o back pain x 2 weeks. Pt was admitted for T8-T9 osteomyelitis. PMH: HTN, obesity, GERD ?  ?Clinical Impression ? Pt admitted with above. Pt appears to have depressed spirits and self limiting presentation. Max encouragement provided to participate with therapy and do for himself. With max directional verbal cues pt able to transfer to EOB and to chair with min guard assist. Pt states "I am so weak. I can't go home because I don't have help." Discussed going to rehab to achieve improved strength, endurance, and progress to indep function for safe d/c home with spouse. Daughter is having surgery next week in which patient and his wife are suppose to help with grandchildren. Pt needs to be a mod I for safe return home. Acute PT to cont to follow.   ?   ? ?Recommendations for follow up therapy are one component of a multi-disciplinary discharge planning process, led by the attending physician.  Recommendations may be updated based on patient status, additional functional criteria and insurance authorization. ? ?Follow Up Recommendations Skilled nursing-short term rehab (<3 hours/day) ? ?  ?Assistance Recommended at Discharge Frequent or constant Supervision/Assistance  ?Patient can return home with the following ? A little help with walking and/or transfers;A little help with bathing/dressing/bathroom;Assistance with cooking/housework;Assist for transportation;Help with stairs or ramp for entrance ? ?  ?Equipment Recommendations  (has DME)  ?Recommendations for Other Services ?    ?  ?Functional Status Assessment Patient has had a recent decline in their functional status and demonstrates the ability to make significant improvements in function in a reasonable and predictable amount of time.  ? ?  ?Precautions  / Restrictions Precautions ?Precautions: Fall ?Precaution Comments: morbid obesity ?Restrictions ?Weight Bearing Restrictions: No ?Other Position/Activity Restrictions: self limiting due to back pain  ? ?  ? ?Mobility ? Bed Mobility ?Overal bed mobility: Needs Assistance ?Bed Mobility: Rolling, Sidelying to Sit ?Rolling: Supervision (max verbal cues) ?Sidelying to sit: Min guard ?  ?  ?  ?General bed mobility comments: pt desiring to pull up on PT with [facility] all the way elevated however pt strongly encouraged to complete log roll technique to the push up into sidelying, with max verbal cues, pt able to roll to the L using siderail and HOB at 45 degrees and push self up to EOB ?  ? ?Transfers ?Overall transfer level: Needs assistance ?Equipment used: Rolling walker (2 wheels) ?Transfers: Sit to/from Stand ?Sit to Stand: Min guard ?  ?  ?  ?  ?  ?General transfer comment: encouraged pt to push up from bed to walker, pt asked PT to move and proceeded to stand and pvt self to chair placing L hand on PTs shoulder and bed rail and did not use RW despite max verbal cues ?  ? ?Ambulation/Gait ?  ?  ?  ?  ?  ?  ?  ?General Gait Details: pt deferred due to "I'm just so weak. I haven't been up in 4 days." ? ?Stairs ?  ?  ?  ?  ?  ? ?Wheelchair Mobility ?  ? ?Modified Rankin (Stroke Patients Only) ?  ? ?  ? ?Balance Overall balance assessment: Mild deficits observed, not formally tested (pt to benefit from RW for amb due to reaching for objects in standing) ?  ?  ?  ?  ?  ?  ?  ?  ?  ?  ?  ?  ?  ?  ?  ?  ?  ?  ?   ? ? ? ?  Pertinent Vitals/Pain Pain Assessment ?Pain Assessment: 0-10 ?Pain Score: 2  ?Pain Location: mid back ?Pain Descriptors / Indicators: Sore (stiff)  ? ? ?Home Living Family/patient expects to be discharged to:: Private residence ?Living Arrangements: Spouse/significant other ?Available Help at Discharge: Family;Available PRN/intermittently (wife works druing the day) ?Type of Home: House ?Home Access: Stairs to  enter ?Entrance Stairs-Rails: Can reach both ?Entrance Stairs-Number of Steps: 7 ?  ?Home Layout: One level ?Home Equipment: Agricultural consultant (2 wheels);Shower seat - built in ?   ?  ?Prior Function Prior Level of Function : Working/employed;Independent/Modified Independent ?  ?  ?  ?  ?  ?  ?Mobility Comments: was amb without AD up until re-admit to hospital ?ADLs Comments: wife was assisting alot but due to self limitation ?  ? ? ?Hand Dominance  ? Dominant Hand: Left ? ?  ?Extremity/Trunk Assessment  ? Upper Extremity Assessment ?Upper Extremity Assessment: Overall WFL for tasks assessed (despite saying "i'm so weak") ?  ? ?Lower Extremity Assessment ?Lower Extremity Assessment: Overall WFL for tasks assessed (despite saying "i'm so weak") ?  ? ?Cervical / Trunk Assessment ?Cervical / Trunk Assessment: Other exceptions ?Cervical / Trunk Exceptions: mid back pain  ?Communication  ? Communication:  (muffled speech)  ?Cognition Arousal/Alertness: Awake/alert ?Behavior During Therapy: Flat affect ?Overall Cognitive Status: Within Functional Limits for tasks assessed ?  ?  ?  ?  ?  ?  ?  ?  ?  ?  ?  ?  ?  ?  ?  ?  ?General Comments: pt appears to be in depressed mood and relies on spouse despite being capable to mobilize and perform ADLs on own. Pt appears to lack motivation/desire to improve health//moblize ?  ?  ? ?  ?General Comments General comments (skin integrity, edema, etc.): VSS ? ?  ?Exercises    ? ?Assessment/Plan  ?  ?PT Assessment Patient needs continued PT services  ?PT Problem List Decreased strength;Decreased activity tolerance;Decreased balance;Decreased mobility;Decreased knowledge of use of DME;Decreased safety awareness ? ?   ?  ?PT Treatment Interventions DME instruction;Gait training;Stair training;Functional mobility training;Therapeutic activities;Therapeutic exercise;Balance training   ? ?PT Goals (Current goals can be found in the Care Plan section)  ?Acute Rehab PT Goals ?Patient Stated Goal:  get stronger ?PT Goal Formulation: With patient ?Time For Goal Achievement: 07/08/21 ?Potential to Achieve Goals: Good ? ?  ?Frequency Min 3X/week ?  ? ? ?Co-evaluation   ?  ?  ?  ?  ? ? ?  ?AM-PAC PT "6 Clicks" Mobility  ?Outcome Measure Help needed turning from your back to your side while in a flat bed without using bedrails?: A Little ?Help needed moving from lying on your back to sitting on the side of a flat bed without using bedrails?: A Lot ?Help needed moving to and from a bed to a chair (including a wheelchair)?: A Little ?Help needed standing up from a chair using your arms (e.g., wheelchair or bedside chair)?: A Little ?Help needed to walk in hospital room?: A Lot ?Help needed climbing 3-5 steps with a railing? : A Lot ?6 Click Score: 15 ? ?  ?End of Session Equipment Utilized During Treatment: Gait belt ?Activity Tolerance: Patient limited by fatigue ?Patient left: in chair;with call bell/phone within reach;with family/visitor present;with chair alarm set ?Nurse Communication: Mobility status ?PT Visit Diagnosis: Unsteadiness on feet (R26.81);Repeated falls (R29.6);Difficulty in walking, not elsewhere classified (R26.2) ?  ? ?Time: 3875-6433 ?PT Time Calculation (min) (ACUTE ONLY): 34 min ? ? ?  Charges:   PT Evaluation ?$PT Eval Moderate Complexity: 1 Mod ?PT Treatments ?$Therapeutic Activity: 8-22 mins ?  ?   ? ? ?Lewis ShockAshly Allayah Raineri, PT, DPT ?Acute Rehabilitation Services ?Secure chat preferred ?Office #: [phone] ? ? ?Alexandre Lightsey M Quanesha Klimaszewski ?06/18/2021, 9:46 AM ? ?

## 2021-06-18 NOTE — Progress Notes (Signed)
?PROGRESS NOTE ? ? ? ?Michael PlumberJohn R Marcus  WUJ:[id number] DOB: [birth year] DOA: 06/11/2021 ?PCP: Lovey NewcomerBoyd, William S, PA  ? ?Brief Narrative:  ?The patient. Michael [name] is a 61 year old male with past medical history significant for essential hypertension, obesity who presented [facility] ED on 4/12 with progressive back pain. Patient also reports persistent nausea/vomiting; and has been seen [facility] ED on multiple occasions for such complaint.  Patient recently hospitalized from 3/17 - 3/22 with findings of T8-9 discitis and E. coli bacteremia following recent dental infection.  Patient was discharged on Ancef to continue through 06/27/2021. ?  ?In the ED, WBC count 12.3, hemoglobin 16.2, platelets 309.  Sodium 133, potassium 3.2, chloride 98, CO2 23, glucose 178, BUN 12, creatinine 0.99.  Lipase 41, AST 51, ALT 109.  Total bilirubin 0.8.  CRP 10.2, lactic acid 2.4.  Urinalysis unrevealing.  UDS positive for opiates.  CT abdomen/pelvis with interval worsening of known discitis/hospital-itis at T8-T9 with increased bony erosions and fragmentation of the endplates with increased paravertebral inflammatory changes and soft tissue density, moderate spinal canal stenosis, patchy atelectasis versus infiltrate lung bases, cholelithiasis, stable right adrenal adenoma, degenerative changes lumbar spine with severe spinal canal stenosis L2-3, L4-5.  MR T-spine with substantial progression T8-9 discitis/osteomyelitis since 3/17, eroded endplates with 25% loss of T8 vertebral body height since that time, probable septic facet joints, increased paraspinal phlegmon and can fluid epidural and dural thickening, increased spinal stenosis T8-9 spinal cord mass effect but with no cord edema.   ? ?Seen by neurosurgery, recommend medical management, continue IV antibiotics.  Infectious disease consulted and recommending continuing IV Ceftriaxone until 06/27/21.  Hospital service consulted for further evaluation and management of thoracic  spine progressive discitis/osteomyelitis. ? ?Given his Nausea and Vomiting, GI was consulted and offered EGD but patient refused and recommending Scheduled Zofran and PPI BID. Will advance Diet to Soft Diet today and continue to monitor LFTs. LFTs silightly elevated and patient has not had a Bowel Movement since Saturday but refusing bowel regimen.   ? ?Assessment and Plan: ?No notes have been filed under this hospital service. ?Service: Hospitalist ? ?T8-9 Discitis/Osteomyelitis ?Patient representing to the ED with progressive back pain.  Imaging findings significant for substantial progression of T8-9 discitis/osteomyelitis since 3/17, eroded endplates with 25% loss of T8 vertebral body height since that time, probable septic facet joints, increased paraspinal phlegmon and can fluid epidural and dural thickening, increased spinal stenosis T8-9 spinal cord mass effect but with no cord edema.  CRP elevated 10.2, lactic acid 2.5, WBC count 12.3.  Seen by neurosurgery, recommend medical management, continue IV antibiotics.  ?--Infectious disease following, appreciate assistance ?-Abscess Cx done and showed "No growth aerobically or anaerobically" ?--Blood cultures x2: No growth x5 days ?--S/p T8-9 disc space aspiration by IR 4/14, culture with no growth x3 days ?-C'/w Ketorolac 15 mg mg IV every 8 hours x5 days but will need to watch Renal Fxn carefully  ?-C/w Oxycodone 5 mg every 4 hours PRN moderate pain ?-Continue Dilaudid 0.5 mg IV every 3 hours as needed severe breakthrough pain ?-C/w Ceftriaxone 2 g IV every 24 hours until 06/27/21 and per ID following completion of IV Abx they can add an additional 2 weeks of Cefadroxil and consider re-imaging at some point  ?-Patient has a follow up with Dr. Renold DonVu on 06/25/21  ?-PT/OT recommending SNF ?  ?Nausea/Vomiting ?-Patient with episodic nausea/vomiting, seen in the ED several times.   ?-CT abdomen/pelvis with no bowel obstruction,  no increased stool burden with notable  small gallstones.  Etiology likely secondary to esophageal candidiasis with known current oral thrush versus atypical presentation of gallstones. ?-Seen by gastroenterology, offered EGD but patient declined.  GI now on standby. ?-C/w Protonix 40 mg IV q12h and getting po/IV Zofran 4 mg q8h ?--Full liquid diet, advance as tolerates and will try SOFT today  ?--Supportive care, antiemetics ?  ?Elevated LFTs ?-Etiology likely secondary to progressive infection from osteomyelitis versus medication effect with ceftriaxone.   ?-CK level below normal limits, hepatitis panel negative. ?-AST 51>>34>51>39 -> 47 ?-ALT 109>>64>79>72 -> 84 ?-Tbili Stable and normal at 0.7 ?-Continue to trend LFTs and repeat CMP in the AM  ?  ?Essential Hypertension ?-Home regimen inclueds lisinopril-HCTZ 10-12.5 mg p.o. daily. ?-Was Currently Holding home antihypertensives currently due to borderline hypotension but now BP improving so may resume antihypertensives  ?-C/w Hydralazine 5 mg IV q4hprn HBP ?-Continue to monitor BP per Protocol  ?-Last BP reading was 144/96 ?  ?Oral thrush ?-C/w Nystatin 500,000 units BID ? ?Hyponatremia ?-Mild. Na+ ranging from 132-134 daily with it being 133 this AM ?-Continue to Monitor and Trend and repeat CMP in the AM  ? ?Constipation ?-Patient refusing bowel regimen  ?-On Bisacodyl 5 mg po Daily PRN Moderate Constipation and refused his Bisacodyl 10 mg RC x1 ?-Continuing Miralax 17 gram po Daily PRN Mild Constipation ?-Refusing All other Laxatives and Bowel Regimen  ?  ?Dental Pain ?-DG orthopantogram with multiple missing teeth with dental caries and periapical lucencies. ?-Will needOutpatient follow-up with dentist and he is currently getting Abx ?  ?Physical Deconditioning ?-PT/OT evaluation: Recommending SNF ? ?Hypoalbuminemia ?-Patient's Albumin Level this AM is 2.3 ?-Continue to Monitor and Trend ?-Will consult Nutrition for further evaluation for Supplementation ? ?Obesity ?-Complicates overall prognosis  and care ?-Estimated body mass index is 34.87 kg/m? as calculated from the following: ?  Height as of this encounter: 5\' 11"  (1.803 m). ?  Weight as of this encounter: 113.4 kg.  ?-Weight Loss and Dietary Counseling given and discussed with patient needs for aggressive lifestyle changes/weight loss as this complicates all facets of care.   ?-Will need Close Outpatient follow-up with PCP.   ?  ?DVT prophylaxis: Place and maintain sequential compression device Start: 06/14/21 1223 ?SCDs Start: 06/12/21 1059 ? ?  Code Status: Full Code ?Family Communication: No family present at bedside ? ?Disposition Plan:  ?Level of care: Med-Surg ?Status is: Inpatient ?Remains inpatient appropriate because: Needs to tolerate Diet and PT/OT recommending SNF ?  ?Consultants:  ?Infectious Diseases ?Gastroenterology  ?Interventional Radiology ?Neurosurgery ? ?Procedures:  ?Procedure: T8/T9 disc space aspirate, T9 superior endplate biopsy  ? ?Findings:  ?1. Successful T8/T9 disc space aspirate with 74ml bloody material aspirated  ?2. Successful T9 superior endplate biopsy x2 passes with minimal bony material able to be sampled   ? ?Antimicrobials:  ?Anti-infectives (From admission, onward)  ? ? Start     Dose/Rate Route Frequency Ordered Stop  ? 06/12/21 1000  cefTRIAXone (ROCEPHIN) 2 g in sodium chloride 0.9 % 100 mL IVPB       ? 2 g ?200 mL/hr over 30 Minutes Intravenous Every 24 hours 06/12/21 0901    ? ?  ?  ?Subjective: ?Seen and examined at bedside and states that he is feeling weak still.  Had some nausea but no actual vomiting.  Denies any lightheadedness or dizziness.  States that he has not had a bowel movement since Saturday but does not want a bowel regimen  and is refusing this.  No other concerns or complaints at this time. ? ?Objective: ?Vitals:  ? 06/18/21 0739 06/18/21 0749 06/18/21 1124 06/18/21 1534  ?BP: (!) 164/94 109/69 (!) 149/93 (!) 144/96  ?Pulse: 88 68 99 95  ?Resp: 20 20 20 16   ?Temp: 98.8 ?F (37.1 ?C) 98.7 ?F  (37.1 ?C) 98.4 ?F (36.9 ?C) 97.7 ?F (36.5 ?C)  ?TempSrc: Oral Oral Oral Oral  ?SpO2: 97% 100% 97% 96%  ?Weight:      ?Height:      ? ? ?Intake/Output Summary (Last 24 hours) at 06/18/2021 1735 ?Last data fi

## 2021-06-19 DIAGNOSIS — B370 Candidal stomatitis: Secondary | ICD-10-CM | POA: Diagnosis not present

## 2021-06-19 DIAGNOSIS — E6609 Other obesity due to excess calories: Secondary | ICD-10-CM | POA: Diagnosis not present

## 2021-06-19 DIAGNOSIS — I10 Essential (primary) hypertension: Secondary | ICD-10-CM | POA: Diagnosis not present

## 2021-06-19 DIAGNOSIS — M4644 Discitis, unspecified, thoracic region: Secondary | ICD-10-CM | POA: Diagnosis not present

## 2021-06-19 LAB — COMPREHENSIVE METABOLIC PANEL
ALT: 80 U/L — ABNORMAL HIGH (ref 0–44)
AST: 39 U/L (ref 15–41)
Albumin: 2.4 g/dL — ABNORMAL LOW (ref 3.5–5.0)
Alkaline Phosphatase: 102 U/L (ref 38–126)
Anion gap: 8 (ref 5–15)
BUN: 9 mg/dL (ref 6–20)
CO2: 23 mmol/L (ref 22–32)
Calcium: 8.6 mg/dL — ABNORMAL LOW (ref 8.9–10.3)
Chloride: 101 mmol/L (ref 98–111)
Creatinine, Ser: 0.91 mg/dL (ref 0.61–1.24)
GFR, Estimated: >60 mL/min (ref >60)
Glucose, Bld: 114 mg/dL — ABNORMAL HIGH (ref 70–99)
Potassium: 4 mmol/L (ref 3.5–5.1)
Sodium: 132 mmol/L — ABNORMAL LOW (ref 135–145)
Total Bilirubin: 0.7 mg/dL (ref 0.3–1.2)
Total Protein: 6.9 g/dL (ref 6.5–8.1)

## 2021-06-19 LAB — CBC WITH DIFFERENTIAL/PLATELET
Abs Immature Granulocytes: 0.2 10*3/uL — ABNORMAL HIGH (ref 0.00–0.07)
Basophils Absolute: 0.1 10*3/uL (ref 0.0–0.1)
Basophils Relative: 1 %
Eosinophils Absolute: 0.2 10*3/uL (ref 0.0–0.5)
Eosinophils Relative: 3 %
HCT: 40.4 % (ref 39.0–52.0)
Hemoglobin: 13.4 g/dL (ref 13.0–17.0)
Immature Granulocytes: 2 %
Lymphocytes Relative: 15 %
Lymphs Abs: 1.2 10*3/uL (ref 0.7–4.0)
MCH: 29.5 pg (ref 26.0–34.0)
MCHC: 33.2 g/dL (ref 30.0–36.0)
MCV: 89 fL (ref 80.0–100.0)
Monocytes Absolute: 0.8 10*3/uL (ref 0.1–1.0)
Monocytes Relative: 9 %
Neutro Abs: 5.8 10*3/uL (ref 1.7–7.7)
Neutrophils Relative %: 70 %
Platelets: 242 10*3/uL (ref 150–400)
RBC: 4.54 MIL/uL (ref 4.22–5.81)
RDW: 14 % (ref 11.5–15.5)
WBC: 8.3 10*3/uL (ref 4.0–10.5)
nRBC: 0 % (ref 0.0–0.2)

## 2021-06-19 LAB — PHOSPHORUS: Phosphorus: 2.7 mg/dL (ref 2.5–4.6)

## 2021-06-19 LAB — MAGNESIUM: Magnesium: 2 mg/dL (ref 1.7–2.4)

## 2021-06-19 MED ORDER — FLUCONAZOLE IN SODIUM CHLORIDE 200-0.9 MG/100ML-% IV SOLN
200.0000 mg | Freq: Every day | INTRAVENOUS | Status: DC
  Administered 2021-06-19 – 2021-07-01 (×13): 200 mg via INTRAVENOUS
  Filled 2021-06-19 (×14): qty 100

## 2021-06-19 NOTE — Progress Notes (Signed)
Inpatient Rehabilitation Admissions Coordinator  ? ?I met with patient at bedside as planned. I discussed our recommendation of SNF level rehab for he is has not demonstrated the ability for intensive rehab required of a [facility] admit. TOC made aware. We will sign off at this time. ? ?Danne Baxter, RN, MSN ?Rehab Admissions Coordinator ?([phone] ?06/19/2021 3:09 PM ? ?

## 2021-06-19 NOTE — Progress Notes (Signed)
Inpatient Rehabilitation Admissions Coordinator  ? ?I met at bedside with patient for rehab assessment. I discussed the intensity required of a possible [facility] admit. Patient states he does not feel he can do aggressive therapy, but wants to receive his rehab here. I discussed the need to sit in the chair today for at least an hour and that I would return this afternoon to further determine if Cir vs SNF would be recommended. SNF level anticipated. ? ?Danne Baxter, RN, MSN ?Rehab Admissions Coordinator ?([phone] ?06/19/2021 11:06 AM\ ?

## 2021-06-19 NOTE — Plan of Care (Signed)
Pt is alert oriented x 4. Pt has not had much sleep. Pt c/o pain to mid upper back 9/10. dilaudid given, not effective, prn oxycodone given after. Pt stated he has noticed his pain has been increasing and pain medications are not lasting.  Pt stated he would like to try to use the bed pan but wants to wait until his back pain has decreased. Pt is also refusing to take nystatin. Pt encouraged to turn in bed. Pt verbalized desire to use bed pan but refused to attempt, he didn't want to move.  ? ? ?Problem: Education: ?Goal: Knowledge of General Education information will improve ?Description: Including pain rating scale, medication(s)/side effects and non-pharmacologic comfort measures ?Outcome: Progressing ?  ?Problem: Health Behavior/Discharge Planning: ?Goal: Ability to manage health-related needs will improve ?Outcome: Progressing ?  ?Problem: Clinical Measurements: ?Goal: Ability to maintain clinical measurements within normal limits will improve ?Outcome: Progressing ?Goal: Will remain free from infection ?Outcome: Progressing ?Goal: Diagnostic test results will improve ?Outcome: Progressing ?Goal: Respiratory complications will improve ?Outcome: Progressing ?Goal: Cardiovascular complication will be avoided ?Outcome: Progressing ?  ?Problem: Activity: ?Goal: Risk for activity intolerance will decrease ?Outcome: Progressing ?  ?Problem: Nutrition: ?Goal: Adequate nutrition will be maintained ?Outcome: Progressing ?  ?Problem: Coping: ?Goal: Level of anxiety will decrease ?Outcome: Progressing ?  ?Problem: Elimination: ?Goal: Will not experience complications related to bowel motility ?Outcome: Progressing ?Goal: Will not experience complications related to urinary retention ?Outcome: Progressing ?  ?Problem: Pain Managment: ?Goal: General experience of comfort will improve ?Outcome: Progressing ?  ?Problem: Safety: ?Goal: Ability to remain free from injury will improve ?Outcome: Progressing ?  ?Problem: Skin  Integrity: ?Goal: Risk for impaired skin integrity will decrease ?Outcome: Progressing ?  ?

## 2021-06-19 NOTE — Plan of Care (Signed)
?  Problem: Education: ?Goal: Knowledge of General Education information will improve ?Description: Including pain rating scale, medication(s)/side effects and non-pharmacologic comfort measures ?Outcome: Progressing ?  ?Problem: Clinical Measurements: ?Goal: Ability to maintain clinical measurements within normal limits will improve ?Outcome: Progressing ?Goal: Will remain free from infection ?Outcome: Progressing ?Goal: Diagnostic test results will improve ?Outcome: Progressing ?Goal: Respiratory complications will improve ?Outcome: Progressing ?  ?Problem: Nutrition: ?Goal: Adequate nutrition will be maintained ?Outcome: Progressing ?  ?Problem: Elimination: ?Goal: Will not experience complications related to urinary retention ?Outcome: Progressing ?  ?Problem: Pain Managment: ?Goal: General experience of comfort will improve ?Outcome: Progressing ?  ?Problem: Safety: ?Goal: Ability to remain free from injury will improve ?Outcome: Progressing ?  ?Problem: Skin Integrity: ?Goal: Risk for impaired skin integrity will decrease ?Outcome: Progressing ?  ?

## 2021-06-19 NOTE — Progress Notes (Signed)
Physical Therapy Treatment ?Patient Details ?Name: Michael [name] ?MRN: [id number] ?DOB: Sep 02, 1960 ?Today's Date: 06/19/2021 ? ? ?History of Present Illness Michael [name] is a 61 y.o. male who presented to ED c/o back pain x 2 weeks. Pt was admitted for T8-T9 osteomyelitis. PMH: HTN, obesity, GERD ? ?  ?PT Comments  ? ? Pt received in bed with c/o having a bad night. He required min guard assist bed mobility, min guard assist sit to stand, and min guard assist sidestepping with RW bedside. Pt declining ambulation and sitting up in recliner, stating "nobody understands how weak I am."  Pt educated on benefits of mobility but continued to decline. Pt returned to bed at end of session. ?   ?Recommendations for follow up therapy are one component of a multi-disciplinary discharge planning process, led by the attending physician.  Recommendations may be updated based on patient status, additional functional criteria and insurance authorization. ? ?Follow Up Recommendations ? Skilled nursing-short term rehab (<3 hours/day) ?  ?  ?Assistance Recommended at Discharge Frequent or constant Supervision/Assistance  ?Patient can return home with the following A little help with walking and/or transfers;A little help with bathing/dressing/bathroom;Assistance with cooking/housework;Assist for transportation;Help with stairs or ramp for entrance ?  ?Equipment Recommendations ? None recommended by PT  ?  ?Recommendations for Other Services   ? ? ?  ?Precautions / Restrictions Precautions ?Precautions: Fall ?Precaution Comments: morbid obesity ?Restrictions ?Other Position/Activity Restrictions: self limiting due to back pain  ?  ? ?Mobility ? Bed Mobility ?Overal bed mobility: Needs Assistance ?Bed Mobility: Supine to Sit, Sit to Supine ?  ?  ?Supine to sit: Min guard, HOB elevated ?Sit to supine: Min guard ?  ?General bed mobility comments: +rail, increased time ?  ? ?Transfers ?Overall transfer level: Needs assistance ?Equipment  used: Rolling walker (2 wheels) ?Transfers: Sit to/from Stand ?Sit to Stand: Min guard, From elevated surface ?  ?  ?  ?  ?  ?General transfer comment: Pt pulling up on RW. Increased time to power up ?  ? ?Ambulation/Gait ?Ambulation/Gait assistance: Min guard ?  ?Assistive device: Rolling walker (2 wheels) ?  ?  ?  ?  ?General Gait Details: 2-3 sidesteps bedside with RW ? ? ?Stairs ?  ?  ?  ?  ?  ? ? ?Wheelchair Mobility ?  ? ?Modified Rankin (Stroke Patients Only) ?  ? ? ?  ?Balance Overall balance assessment: Mild deficits observed, not formally tested ?  ?  ?  ?  ?  ?  ?  ?  ?  ?  ?  ?  ?  ?  ?  ?  ?  ?  ?  ? ?  ?Cognition Arousal/Alertness: Awake/alert ?Behavior During Therapy: Flat affect ?Overall Cognitive Status: Within Functional Limits for tasks assessed ?  ?  ?  ?  ?  ?  ?  ?  ?  ?  ?  ?  ?  ?  ?  ?  ?  ?  ?  ? ?  ?Exercises   ? ?  ?General Comments   ?  ?  ? ?Pertinent Vitals/Pain Pain Assessment ?Pain Assessment: Faces ?Faces Pain Scale: Hurts little more ?Pain Location: mid back ?Pain Descriptors / Indicators: Sore, Discomfort ?Pain Intervention(s): Limited activity within patient's tolerance, Repositioned, Monitored during session  ? ? ?Home Living   ?  ?  ?  ?  ?  ?  ?  ?  ?  ?   ?  ?  Prior Function    ?  ?  ?   ? ?PT Goals (current goals can now be found in the care plan section) Acute Rehab PT Goals ?Patient Stated Goal: get stronger ?Progress towards PT goals: Progressing toward goals ? ?  ?Frequency ? ? ? Min 3X/week ? ? ? ?  ?PT Plan Current plan remains appropriate  ? ? ?Co-evaluation   ?  ?  ?  ?  ? ?  ?AM-PAC PT "6 Clicks" Mobility   ?Outcome Measure ? Help needed turning from your back to your side while in a flat bed without using bedrails?: A Little ?Help needed moving from lying on your back to sitting on the side of a flat bed without using bedrails?: A Little ?Help needed moving to and from a bed to a chair (including a wheelchair)?: A Little ?Help needed standing up from a chair using  your arms (e.g., wheelchair or bedside chair)?: A Little ?Help needed to walk in hospital room?: A Little ?Help needed climbing 3-5 steps with a railing? : A Lot ?6 Click Score: 17 ? ?  ?End of Session   ?Activity Tolerance: Patient limited by fatigue;Other (comment) (self limiting) ?Patient left: in bed;with call bell/phone within reach ?Nurse Communication: Mobility status ?PT Visit Diagnosis: Unsteadiness on feet (R26.81);Repeated falls (R29.6);Difficulty in walking, not elsewhere classified (R26.2) ?  ? ? ?Time: 8676-1950 ?PT Time Calculation (min) (ACUTE ONLY): 17 min ? ?Charges:  $Gait Training: 8-22 mins          ?          ? ?Aida Raider, PT  ?Office # [phone] ?Pager [phone] ? ? ? ?Ilda Foil ?06/19/2021, 11:18 AM ? ?

## 2021-06-19 NOTE — Progress Notes (Signed)
?PROGRESS NOTE ? ? ? ?Michael [name]  ZOX:[id number] DOB: 01-30-1961 DOA: 06/11/2021 ?PCP: Michael NewcomerBoyd, William S, PA  ? ?Brief Narrative:  ?The patient. Michael [name] is a 61 year old male with past medical history significant for essential hypertension, obesity who presented to [facility] ED on 4/12 with progressive back pain. Patient also reports persistent nausea/vomiting; and has been seen at the [facility], ED on multiple occasions for such complaint.  Patient recently hospitalized from 3/17 - 3/22 with findings of T8-9 discitis and E. coli bacteremia following recent dental infection.  Patient was discharged on Ancef to continue through 06/27/2021. ?  ?In the ED, WBC count 12.3, hemoglobin 16.2, platelets 309.  Sodium 133, potassium 3.2, chloride 98, CO2 23, glucose 178, BUN 12, creatinine 0.99.  Lipase 41, AST 51, ALT 109.  Total bilirubin 0.8.  CRP 10.2, lactic acid 2.4.  Urinalysis unrevealing.  UDS positive for opiates.  CT abdomen/pelvis with interval worsening of known discitis/hospital-itis at T8-T9 with increased bony erosions and fragmentation of the endplates with increased paravertebral inflammatory changes and soft tissue density, moderate spinal canal stenosis, patchy atelectasis versus infiltrate lung bases, cholelithiasis, stable right adrenal adenoma, degenerative changes lumbar spine with severe spinal canal stenosis L2-3, L4-5.  MR T-spine with substantial progression T8-9 discitis/osteomyelitis since 3/17, eroded endplates with 25% loss of T8 vertebral body height since that time, probable septic facet joints, increased paraspinal phlegmon and can fluid epidural and dural thickening, increased spinal stenosis T8-9 spinal cord mass effect but with no cord edema.   ? ?Seen by neurosurgery, recommend medical management, continue IV antibiotics.  Infectious disease consulted and recommending continuing IV Ceftriaxone until 06/27/21.  Hospital service consulted for further evaluation and management of thoracic  spine progressive discitis/osteomyelitis. ? ?Given his Nausea and Vomiting, GI was consulted and offered EGD but patient refused and recommending Scheduled Zofran and PPI BID. Will advance Diet to Soft Diet today and continue to monitor LFTs. LFTs silightly elevated and patient has not had a Bowel Movement since Saturday but refusing bowel regimen but was agreeable to taking the bisacodyl. ? ?PT OT evaluated and recommended CIR but patient did not demonstrate the ability for intensive rehab required to admit to CIR so now recommendations for him go to SNF.  ? ?Assessment and Plan: ? ?T8-9 Discitis/Osteomyelitis ?Patient representing to the ED with progressive back pain.  Imaging findings significant for substantial progression of T8-9 discitis/osteomyelitis since 3/17, eroded endplates with 25% loss of T8 vertebral body height since that time, probable septic facet joints, increased paraspinal phlegmon and can fluid epidural and dural thickening, increased spinal stenosis T8-9 spinal cord mass effect but with no cord edema.  CRP elevated 10.2, lactic acid 2.5, WBC count 12.3.  Seen by neurosurgery, recommend medical management, continue IV antibiotics.  ?-WBC is now 8.3 ?--Infectious disease following, appreciate assistance ?-Abscess Cx done and showed "No growth aerobically or anaerobically" ?--Blood cultures x2: No growth x5 days ?--[name]/p T8-9 disc space aspiration by IR 4/14, culture with no growth x3 days ?-C'/w Ketorolac 15 mg mg IV every 8 hours x5 days but will need to watch Renal Fxn carefully  ?-C/w Oxycodone 5 mg every 4 hours PRN moderate pain ?-Continue Dilaudid 0.5 mg IV every 3 hours as needed severe breakthrough pain ?-C/w Ceftriaxone 2 g IV every 24 hours until 06/27/21 and per ID following completion of IV Abx they can add an additional 2 weeks of Cefadroxil and consider re-imaging at some point  ?-Patient has a follow up with  Michael [name] on 06/25/21  ?-PT/OT recommending CIR but not a candidate but will  need to go to SNF ?  ?Nausea/Vomiting ?-Patient with episodic nausea/vomiting, seen in the ED several times.   ?-CT abdomen/pelvis with no bowel obstruction, no increased stool burden with notable small gallstones.  Etiology likely secondary to esophageal candidiasis with known current oral thrush versus atypical presentation of gallstones. ?-Seen by gastroenterology, offered EGD but patient declined.  GI now on standby. ?-C/w Protonix 40 mg IV q12h and getting po/IV Zofran 4 mg q8h ?--Full liquid diet, advance as tolerates and will try SOFT yesterday and will advance to Heart Healthy Diet  ?--Supportive care, antiemetics ?  ?Elevated LFTs ?-Etiology likely secondary to progressive infection from osteomyelitis versus medication effect with ceftriaxone.   ?-CK level below normal limits, hepatitis panel negative. ?-AST 51>>34>51>39 -> 47 -> 39 ?-ALT 109>>64>79>72 -> 84 -> 80 ?-Tbili Stable and normal at 0.7 x3 ?-Continue to trend LFTs and repeat CMP in the AM  ?  ?Essential Hypertension ?-Home regimen inclueds lisinopril-HCTZ 10-12.5 mg p.o. daily. ?-Was Currently Holding home antihypertensives currently due to borderline hypotension but now BP improving so may resume antihypertensives  ?-C/w Hydralazine 5 mg IV q4hprn HBP ?-Continue to monitor BP per Protocol  ?-Last BP reading was 148/97 ?  ?Oral thrush ?-C/w Nystatin 500,000 units BID but not taking it consistently so will place on Fluconazole with Pharmacy to Dose  ?  ?Hyponatremia ?-Mild. Na+ ranging from 132-134 daily with it being 132 this AM ?-Continue to Monitor and Trend and repeat CMP in the AM  ?  ?Constipation ?-Patient refusing bowel regimen  ?-On Bisacodyl 5 mg po Daily PRN Moderate Constipation and refused his Bisacodyl 10 mg RC x1 ?-Continuing Miralax 17 gram po Daily PRN Mild Constipation ?-Refusing All other Laxatives and Bowel Regimen  ?  ?Dental Pain ?-DG orthopantogram with multiple missing teeth with dental caries and periapical  lucencies. ?-Will needOutpatient follow-up with dentist and he is currently getting Abx ?  ?Physical Deconditioning ?-PT/OT evaluation: Recommending SNF (Had recommended CIR but did not demonstrate the ability for intensive rehab) ?  ?Hypoalbuminemia ?-Patient'[name] Albumin Level this AM is 2.4 ?-Continue to Monitor and Trend ?-Will consult Nutrition for further evaluation for Supplementation ?  ?Obesity ?-Complicates overall prognosis and care ?-Estimated body mass index is 34.87 kg/m? as calculated from the following: ?  Height as of this encounter: 5\' 11"  (1.803 m). ?  Weight as of this encounter: 113.4 kg.  ?-Weight Loss and Dietary Counseling given and discussed with patient needs for aggressive lifestyle changes/weight loss as this complicates all facets of care.   ?-Will need Close Outpatient follow-up with PCP.   ? ?DVT prophylaxis: Place and maintain sequential compression device Start: 06/14/21 1223 ?SCDs Start: 06/12/21 1059 ? ?  Code Status: Full Code ?Family Communication: No family present at bedside  ? ?Disposition Plan:  ?Level of care: Med-Surg ?Status is: Inpatient ?Remains inpatient appropriate because: Needs to tolerate Diet advancement  ?  ?Consultants:  ?Infectious Diseases ?Gastroenterology  ?Interventional Radiology ?Neurosurgery ? ?Procedures:  ?Procedure: T8/T9 disc space aspirate, T9 superior endplate biopsy  ?  ?Findings:  ?1. Successful T8/T9 disc space aspirate with 34ml bloody material aspirated  ?2. Successful T9 superior endplate biopsy x2 passes with minimal bony material able to be sampled   ?  ? ?Antimicrobials:  ?Anti-infectives (From admission, onward)  ? ? Start     Dose/Rate Route Frequency Ordered Stop  ? 06/19/21 1100  fluconazole (DIFLUCAN) IVPB  200 mg       ? 200 mg ?100 mL/hr over 60 Minutes Intravenous Daily 06/19/21 0942    ? 06/12/21 1000  cefTRIAXone (ROCEPHIN) 2 g in sodium chloride 0.9 % 100 mL IVPB       ? 2 g ?200 mL/hr over 30 Minutes Intravenous Every 24 hours  06/12/21 0901    ? ?  ?  ?Subjective: ?Seen and examined at bedside and he continues to feel weak.  States his nausea is improved a little bit.  Still states that he has not had any bowel movements.  Denies any lightheadedne

## 2021-06-19 NOTE — Progress Notes (Signed)
Pharmacy Antibiotic Note ? ?Michael [name] is a 61 y.o. male admitted on 06/11/2021 with  oropharyngeal candidiasis .  Oral nystatin trialed. Pharmacy has been consulted for fluconazole dosing.  ? ?Plan: ?Fluconazole 200mg  daily ?Follow-up ability to tolerate PO medications ?Follow-up clinical signs of improvment ? ? ?Height: 5\' 11"  (180.3 cm) ?Weight: 113.4 kg (250 lb) ?IBW/kg (Calculated) : 75.3 ? ?Temp (24hrs), Avg:98.8 ?F (37.1 ?C), Min:97.7 ?F (36.5 ?C), Max:99.6 ?F (37.6 ?C) ? ?Recent Labs  ?Lab 06/14/21 ?0154 06/15/21 ?0248 06/16/21 ?0203 06/17/21 ?[id number] 06/18/21 ?[id number] 06/19/21 ?0240  ?WBC 9.3  --   --   --  7.6 8.3  ?CREATININE 0.87 0.87 1.04 0.96 0.94 0.91  ?  ?Estimated Creatinine Clearance: 111.9 mL/min (by C-G formula based on SCr of 0.91 mg/dL).   ? ?No Known Allergies ? ? ?Thank you for allowing pharmacy to be a part of this patient?s care. ? ?Ardyth Harps, PharmD ?Clinical Pharmacist ? ? ?

## 2021-06-20 DIAGNOSIS — M4644 Discitis, unspecified, thoracic region: Secondary | ICD-10-CM | POA: Diagnosis not present

## 2021-06-20 DIAGNOSIS — E6609 Other obesity due to excess calories: Secondary | ICD-10-CM | POA: Diagnosis not present

## 2021-06-20 DIAGNOSIS — I10 Essential (primary) hypertension: Secondary | ICD-10-CM | POA: Diagnosis not present

## 2021-06-20 DIAGNOSIS — B370 Candidal stomatitis: Secondary | ICD-10-CM | POA: Diagnosis not present

## 2021-06-20 LAB — CBC WITH DIFFERENTIAL/PLATELET
Abs Immature Granulocytes: 0.17 10*3/uL — ABNORMAL HIGH (ref 0.00–0.07)
Basophils Absolute: 0.1 10*3/uL (ref 0.0–0.1)
Basophils Relative: 1 %
Eosinophils Absolute: 0.2 10*3/uL (ref 0.0–0.5)
Eosinophils Relative: 3 %
HCT: 40.3 % (ref 39.0–52.0)
Hemoglobin: 13.1 g/dL (ref 13.0–17.0)
Immature Granulocytes: 3 %
Lymphocytes Relative: 23 %
Lymphs Abs: 1.5 10*3/uL (ref 0.7–4.0)
MCH: 28.7 pg (ref 26.0–34.0)
MCHC: 32.5 g/dL (ref 30.0–36.0)
MCV: 88.2 fL (ref 80.0–100.0)
Monocytes Absolute: 0.9 10*3/uL (ref 0.1–1.0)
Monocytes Relative: 14 %
Neutro Abs: 3.7 10*3/uL (ref 1.7–7.7)
Neutrophils Relative %: 56 %
Platelets: 219 10*3/uL (ref 150–400)
RBC: 4.57 MIL/uL (ref 4.22–5.81)
RDW: 14 % (ref 11.5–15.5)
WBC: 6.5 10*3/uL (ref 4.0–10.5)
nRBC: 0 % (ref 0.0–0.2)

## 2021-06-20 LAB — COMPREHENSIVE METABOLIC PANEL
ALT: 73 U/L — ABNORMAL HIGH (ref 0–44)
AST: 35 U/L (ref 15–41)
Albumin: 2.4 g/dL — ABNORMAL LOW (ref 3.5–5.0)
Alkaline Phosphatase: 98 U/L (ref 38–126)
Anion gap: 7 (ref 5–15)
BUN: 9 mg/dL (ref 6–20)
CO2: 24 mmol/L (ref 22–32)
Calcium: 8.6 mg/dL — ABNORMAL LOW (ref 8.9–10.3)
Chloride: 100 mmol/L (ref 98–111)
Creatinine, Ser: 0.86 mg/dL (ref 0.61–1.24)
GFR, Estimated: >60 mL/min (ref >60)
Glucose, Bld: 113 mg/dL — ABNORMAL HIGH (ref 70–99)
Potassium: 3.8 mmol/L (ref 3.5–5.1)
Sodium: 131 mmol/L — ABNORMAL LOW (ref 135–145)
Total Bilirubin: 0.8 mg/dL (ref 0.3–1.2)
Total Protein: 6.8 g/dL (ref 6.5–8.1)

## 2021-06-20 LAB — MAGNESIUM: Magnesium: 2.1 mg/dL (ref 1.7–2.4)

## 2021-06-20 LAB — PHOSPHORUS: Phosphorus: 2.9 mg/dL (ref 2.5–4.6)

## 2021-06-20 MED ORDER — SENNOSIDES-DOCUSATE SODIUM 8.6-50 MG PO TABS
2.0000 | ORAL_TABLET | Freq: Two times a day (BID) | ORAL | Status: DC
  Administered 2021-06-20: 2 via ORAL
  Administered 2021-06-21: 1 via ORAL
  Administered 2021-06-22 – 2021-06-26 (×4): 2 via ORAL
  Administered 2021-06-29: 1 via ORAL
  Administered 2021-06-30: 2 via ORAL
  Administered 2021-06-30: 1 via ORAL
  Administered 2021-07-01 – 2021-07-02 (×2): 2 via ORAL
  Filled 2021-06-20 (×22): qty 2

## 2021-06-20 MED ORDER — PROSOURCE PLUS PO LIQD
30.0000 mL | Freq: Two times a day (BID) | ORAL | Status: DC
  Filled 2021-06-20 (×7): qty 30

## 2021-06-20 NOTE — Progress Notes (Signed)
?PROGRESS NOTE ? ? ? ?[name]  ZCH:[id number] DOB: [birth year] DOA: 06/11/2021 ?PCP: [name], PA  ? ?Brief Narrative:  ?The patient. Michael [name] is a 61 year old male with past medical history significant for essential hypertension, obesity who presented to [facility] ED on 4/12 with progressive back pain. Patient also reports persistent nausea/vomiting; and has been seen at the [facility], ED on multiple occasions for such complaint.  Patient recently hospitalized from 3/17 - 3/22 with findings of T8-9 discitis and E. coli bacteremia following recent dental infection.  Patient was discharged on Ancef to continue through 06/27/2021. ?  ?In the ED, WBC count 12.3, hemoglobin 16.2, platelets 309.  Sodium 133, potassium 3.2, chloride 98, CO2 23, glucose 178, BUN 12, creatinine 0.99.  Lipase 41, AST 51, ALT 109.  Total bilirubin 0.8.  CRP 10.2, lactic acid 2.4.  Urinalysis unrevealing.  UDS positive for opiates.  CT abdomen/pelvis with interval worsening of known discitis/hospital-itis at T8-T9 with increased bony erosions and fragmentation of the endplates with increased paravertebral inflammatory changes and soft tissue density, moderate spinal canal stenosis, patchy atelectasis versus infiltrate lung bases, cholelithiasis, stable right adrenal adenoma, degenerative changes lumbar spine with severe spinal canal stenosis L2-3, L4-5.  MR T-spine with substantial progression T8-9 discitis/osteomyelitis since 3/17, eroded endplates with 25% loss of T8 vertebral body height since that time, probable septic facet joints, increased paraspinal phlegmon and can fluid epidural and dural thickening, increased spinal stenosis T8-9 spinal cord mass effect but with no cord edema.   ? ?Seen by neurosurgery, recommend medical management, continue IV antibiotics.  Infectious disease consulted and recommending continuing IV Ceftriaxone until 06/27/21.  Hospital service consulted for further evaluation and management of thoracic  spine progressive discitis/osteomyelitis. ? ?Given his Nausea and Vomiting, GI was consulted and offered EGD but patient refused and recommending Scheduled Zofran and PPI BID. Will advance Diet to Soft Diet today and continue to monitor LFTs. LFTs silightly elevated and patient has not had a Bowel Movement since Saturday but refusing bowel regimen but was agreeable to taking the bisacodyl. ? ?PT OT evaluated and recommended CIR but patient did not demonstrate the ability for intensive rehab required to admit to CIR so now recommendations for him go to SNF.  ? ? ?Assessment and Plan: ? ?T8-9 Discitis/Osteomyelitis ?Patient representing to the ED with progressive back pain.  Imaging findings significant for substantial progression of T8-9 discitis/osteomyelitis since 3/17, eroded endplates with 25% loss of T8 vertebral body height since that time, probable septic facet joints, increased paraspinal phlegmon and can fluid epidural and dural thickening, increased spinal stenosis T8-9 spinal cord mass effect but with no cord edema.  CRP elevated 10.2, lactic acid 2.5, WBC count 12.3.  Seen by neurosurgery, recommend medical management, continue IV antibiotics.  ?-WBC is now 6.5 ?--Infectious disease following, appreciate assistance ?-Abscess Cx done and showed "No growth aerobically or anaerobically" ?--Blood cultures x2: No growth x5 days ?--S/p T8-9 disc space aspiration by IR 4/14, culture with no growth x3 days ?-C'/w Ketorolac 15 mg mg IV every 8 hours x5 days but will need to watch Renal Fxn carefully  ?-C/w Oxycodone 5 mg every 4 hours PRN moderate pain ?-Continue Dilaudid 0.5 mg IV every 3 hours as needed severe breakthrough pain ?-C/w Ceftriaxone 2 g IV every 24 hours until 06/27/21 and per ID following completion of IV Abx they can add an additional 2 weeks of Cefadroxil and consider re-imaging at some point  ?-Patient has a follow up  with Dr. Renold Don on 06/25/21  ?-PT/OT recommending CIR but not a candidate but will  need to go to SNF ?  ?Nausea/Vomiting, improving  ?-Patient with episodic nausea/vomiting, seen in the ED several times.   ?-CT abdomen/pelvis with no bowel obstruction, no increased stool burden with notable small gallstones.  Etiology likely secondary to esophageal candidiasis with known current oral thrush versus atypical presentation of gallstones. ?-Seen by gastroenterology, offered EGD but patient declined.  GI now on standby. ?-C/w Protonix 40 mg IV q12h and getting po/IV Zofran 4 mg q8h ?--Full liquid diet, advance as tolerates and will try SOFT yesterday and will advance to Heart Healthy Diet  ?--Supportive care, antiemetics ?  ?Elevated LFTs ?-Etiology likely secondary to progressive infection from osteomyelitis versus medication effect with ceftriaxone.   ?-CK level below normal limits, hepatitis panel negative. ?-AST 51>>34>51>39 -> 47 -> 39 -> 35 ?-ALT 109>>64>79>72 -> 84 -> 80 -> 73 ?-Tbili Stable and normal at 0.7 x3 ?-Continue to trend LFTs and repeat CMP in the AM  ?  ?Essential Hypertension ?-Home regimen inclueds lisinopril-HCTZ 10-12.5 mg p.o. daily. ?-Was Currently Holding home antihypertensives currently due to borderline hypotension but now BP improving so may resume antihypertensives  ?-C/w Hydralazine 5 mg IV q4hprn HBP ?-Continue to monitor BP per Protocol  ?-Last BP reading was 157/97 ?  ?Oral thrush ?-C/w Nystatin 500,000 units BID but not taking it consistently so will place on Fluconazole with Pharmacy to Dose  ?  ?Hyponatremia ?-Mild. Na+ ranging from 131-134 daily with it being 131 this AM ?-Continue to Monitor and Trend and repeat CMP in the AM  ?  ?Constipation ?-Patient has been refusing bowel regimen  ?-On Bisacodyl 5 mg po Daily PRN Moderate Constipation and refused his Bisacodyl 10 mg RC x1 ?-Continuing Miralax 17 gram po Daily PRN Mild Constipation ?-Refusing All other Laxatives and Bowel Regimen but now agreeable to Senna-Docusate 2 tab po BID  ?  ?Dental Pain ?-DG  orthopantogram with multiple missing teeth with dental caries and periapical lucencies. ?-Will need Outpatient follow-up with dentist and he is currently getting Abx ?  ?Physical Deconditioning ?-PT/OT evaluation: Recommending SNF (Had recommended CIR but did not demonstrate the ability for intensive rehab) ?-SNF options given and patient prefers [facility] and currently is SNF placement is under review and informed authorization is not initiated as yet ?  ?Hypoalbuminemia ?-Patient's Albumin Level this AM is 2.4 again ?-Continue to Monitor and Trend ?-Will consult Nutrition for further evaluation for Supplementation ? ?Bilateral Vision Blurriness ?-Patient states it has been going on for a weak and he does not wear glasses ?-Check Head CT and this is still pending ?  ?Obesity ?-Complicates overall prognosis and care ?-Estimated body mass index is 34.87 kg/m? as calculated from the following: ?  Height as of this encounter: 5\' 11"  (1.803 m). ?  Weight as of this encounter: 113.4 kg.  ?-Weight Loss and Dietary Counseling given and discussed with patient needs for aggressive lifestyle changes/weight loss as this complicates all facets of care.   ?-Will need Close Outpatient follow-up with PCP.   ?-Was consulted and they are recommending discontinuing boost breeze and discontinue Ensure alive and adding Prosource plus p.o. 30 mL twice daily and continue multivitamins with minerals daily ?  ? ?DVT prophylaxis: Place and maintain sequential compression device Start: 06/14/21 1223 ?SCDs Start: 06/12/21 1059 ? ?  Code Status: Full Code ?Family Communication: No family currently at bedside ? ?Disposition Plan:  ?Level of care: Med-Surg ?  Status is: Inpatient ?Remains inpatient appropriate because: Needs diet advancement and SNF placement and currently waiting in short all dietitian and bed availability ?  ?Consultants:  ?Infectious Diseases ?Gastroenterology  ?Interventional Radiology ?Neurosurgery ? ?Procedures:   ?Procedure: T8/T9 disc space aspirate, T9 superior endplate biopsy  ?  ?Findings:  ?1. Successful T8/T9 disc space aspirate with 3ml bloody material aspirated  ?2. Successful T9 superior endplate biopsy x2 passes with

## 2021-06-20 NOTE — Progress Notes (Signed)
Occupational Therapy Treatment ?Patient Details ?Name: Michael [name] ?MRN: [id number] ?DOB: 07-May-1960 ?Today's Date: 06/20/2021 ? ? ?History of present illness Michael [name] is a 61 y.o. male who presented to ED c/o back pain x 2 weeks. Pt was admitted for T8-T9 osteomyelitis. PMH: HTN, obesity, GERD ?  ?OT comments ? Pt is limited by weakness and pain. This session, pt reported that he wanted to go to the bathroom, due to being cloated and having stomach pains. OT retrieved a bariatric BSC for pt. Pt required min A for bed mobility, then sitting EOB, after increased encouragement and support, pt reported that he was not going to get on the [facility] right now. OT provided education on the importance of mobility to limit weakness from hospital stay, as well as how mobility assists with easing constipation. Pt reports understanding, however continued to refuse further mobility. Continuing to recommend SNF. OT will follow acutely.   ? ?Recommendations for follow up therapy are one component of a multi-disciplinary discharge planning process, led by the attending physician.  Recommendations may be updated based on patient status, additional functional criteria and insurance authorization. ?   ?Follow Up Recommendations ? Skilled nursing-short term rehab (<3 hours/day)  ?  ?Assistance Recommended at Discharge Frequent or constant Supervision/Assistance  ?Patient can return home with the following ? A lot of help with walking and/or transfers;A lot of help with bathing/dressing/bathroom;Assistance with cooking/housework;Assist for transportation;Help with stairs or ramp for entrance ?  ?Equipment Recommendations ? BSC/3in1  ?  ?Recommendations for Other Services   ? ?  ?Precautions / Restrictions Precautions ?Precautions: Fall ?Restrictions ?Weight Bearing Restrictions: No ?Other Position/Activity Restrictions: self limiting due to back pain  ? ? ?  ? ?Mobility Bed Mobility ?Overal bed mobility: Needs Assistance ?Bed  Mobility: Rolling, Sidelying to Sit, Sit to Sidelying ?Rolling: Supervision ?Sidelying to sit: Min guard ?  ?  ?Sit to sidelying: Min assist ?General bed mobility comments: +rail, increased time, assist to bring BLE baack into bed ?  ? ?Transfers ?  ?  ?  ?  ?  ?  ?  ?  ?  ?General transfer comment: Deferred due to abdomenal tightness ?  ?  ?Balance Overall balance assessment: Mild deficits observed, not formally tested ?  ?  ?  ?  ?  ?  ?  ?  ?  ?  ?  ?  ?  ?  ?  ?  ?  ?  ?   ? ?ADL either performed or assessed with clinical judgement  ? ?ADL Overall ADL's : Needs assistance/impaired ?  ?  ?  ?  ?  ?  ?  ?  ?  ?  ?  ?  ?Toilet Transfer: BSC/3in1;Requires wide/bariatric ?Toilet Transfer Details (indicate cue type and reason): Attempted to assist pt to [facility], once sitting EOB with max encouragment, pt declined reporting that he didn't need to go and wanted to return tobed ?  ?  ?  ?  ?  ?General ADL Comments: Limited due to patient particiapation ?  ? ?Extremity/Trunk Assessment   ?  ?  ?  ?  ?  ? ?Vision   ?  ?  ?Perception   ?  ?Praxis   ?  ? ?Cognition Arousal/Alertness: Awake/alert ?Behavior During Therapy: Flat affect ?Overall Cognitive Status: Within Functional Limits for tasks assessed ?  ?  ?  ?  ?  ?  ?  ?  ?  ?  ?  ?  ?  ?  ?  ?  ?  ?  ?  ?   ?  Exercises   ? ?  ?Shoulder Instructions   ? ? ?  ?General Comments Pt reporting that he can't do any movement until he is able to go to the bathroom, then refuses to attempt to sit on [facility]  ? ? ?Pertinent Vitals/ Pain       Pain Assessment ?Pain Assessment: 0-10 ?Pain Score: 6  ?Pain Location: abdomne ?Pain Descriptors / Indicators: Discomfort, Grimacing, Tightness ?Pain Intervention(s): Monitored during session, Repositioned ? ?Home Living   ?  ?  ?  ?  ?  ?  ?  ?  ?  ?  ?  ?  ?  ?  ?  ?  ?  ?  ? ?  ?Prior Functioning/Environment    ?  ?  ?  ?   ? ?Frequency ? Min 2X/week  ? ? ? ? ?  ?Progress Toward Goals ? ?OT Goals(current goals can now be found in the care plan  section) ? Progress towards OT goals: Progressing toward goals ? ?Acute Rehab OT Goals ?Patient Stated Goal: none stated ?OT Goal Formulation: With patient ?Time For Goal Achievement: 07/02/21 ?Potential to Achieve Goals: Good ?ADL Goals ?Pt Will Perform Grooming: with modified independence;standing ?Pt Will Perform Lower Body Bathing: with modified independence;sitting/lateral leans;sit to/from stand;with adaptive equipment ?Pt Will Perform Lower Body Dressing: with modified independence;with adaptive equipment;sitting/lateral leans;sit to/from stand ?Pt Will Transfer to Toilet: with modified independence;stand pivot transfer ?Pt Will Perform Toileting - Clothing Manipulation and hygiene: with modified independence;sitting/lateral leans;sit to/from stand  ?Plan Discharge plan remains appropriate;Frequency remains appropriate   ? ?Co-evaluation ? ? ?   ?  ?  ?  ?  ? ?  ?AM-PAC OT "6 Clicks" Daily Activity     ?Outcome Measure ? ? Help from another person eating meals?: A Little ?Help from another person taking care of personal grooming?: A Little ?Help from another person toileting, which includes using toliet, bedpan, or urinal?: A Lot ?Help from another person bathing (including washing, rinsing, drying)?: A Lot ?Help from another person to put on and taking off regular upper body clothing?: A Little ?Help from another person to put on and taking off regular lower body clothing?: A Lot ?6 Click Score: 15 ? ?  ?End of Session   ? ?OT Visit Diagnosis: Unsteadiness on feet (R26.81);Other abnormalities of gait and mobility (R26.89);Muscle weakness (generalized) (M62.81) ?  ?Activity Tolerance Patient limited by pain ?  ?Patient Left in bed;with call bell/phone within reach;with bed alarm set ?  ?Nurse Communication Mobility status ?  ? ?   ? ?Time: 7416-3845 ?OT Time Calculation (min): 39 min ? ?Charges: OT General Charges ?$OT Visit: 1 Visit ?OT Treatments ?$Self Care/Home Management : 23-37 mins ?$Therapeutic  Activity: 8-22 mins ? ?Madalaine Portier H., OTR/L ?Acute Rehabilitation ? ?Kenyette Gundy Elane Bing Plume ?06/20/2021, 5:32 PM ?

## 2021-06-20 NOTE — Progress Notes (Signed)
Nutrition Follow-up ? ?DOCUMENTATION CODES:  ?Obesity unspecified ? ?INTERVENTION:  ?-discontinue Boost Breeze ?-discontinue Ensure Enlive ?-PROSource PLUS PO 67ms BID, each supplement provides 100 kcals and 15 grams of protein ?-continue mvi with minerals  ? ?NUTRITION DIAGNOSIS:  ?Inadequate oral intake related to vomiting, acute illness, mouth pain, poor appetite as evidenced by per patient/family report. ?ongoing ? ?GOAL:  ?Patient will meet greater than or equal to 90% of their needs ?Not met ? ?MONITOR:  ?PO intake, Supplement acceptance, Diet advancement, Labs, Weight trends ? ?REASON FOR ASSESSMENT:  ?Consult ?Other (Comment) (nutritional goals) ? ?ASSESSMENT:  ?Pt admitted from home with back pain secondary to thoracic discitis. PMH significant for HTN and obesity.  Prior hospitalization from 3/17-3/22 with T8-9 discitis and E coli bacteremia following recent dental infection. ? ?4/14 - T8/T9 disc space aspirate, T9 superior endplate biopsy  ?4/15 - diet advanced to full liquids ?4/19 - diet advanced to soft ?4/20 - diet advanced to regular  ? ?PT/OT evaluated pt for potential CIR admit but ultimately recommended SNF.  ? ?Pt with persistent N/V. GI was consulted and recommended EGD; however, pt refused. Pt also noted to have refused bowel regimen when offered due to not having a BM since Saturday (LBM documented as 4/17). PO intake remains poor as a result. Per RN, pt refusing Boost Breeze and Ensure. Will trial Prosource instead given low volume. Recommend Cortrak if PO intake does not improve, though pt likely not agreeable.  ? ?PO Intake: 0-50% x 5 recorded meals (35% avg meal intake)  ? ?Medications: ? bisacodyl  10 mg Rectal Once  ? feeding supplement  1 Container Oral TID BM  ? feeding supplement  237 mL Oral BID BM  ? ondansetron  4 mg Oral Q8H  ? Or  ? ondansetron (ZOFRAN) IV  4 mg Intravenous Q8H  ? pantoprazole (PROTONIX) IV  40 mg Intravenous Q12H  ?IV abx ? ?Labs: ?Recent Labs  ?Lab  06/14/21 ?0154 06/15/21 ?0248 06/16/21 ?0203 06/17/21 ?0694804/19/23 ?0546204/20/23 ?0703504/21/23 ?0409  ?NA 134*   < > 132*   < > 133* 132* 131*  ?K 3.4*   < > 4.0   < > 3.8 4.0 3.8  ?CL 101   < > 100   < > 101 101 100  ?CO2 26   < > 21*   < > _0 ?BUN 7   < > 10   < > _1 ?CREATININE 0.87   < > 1.04   < > 0.94 0.91 0.86  ?CALCIUM 8.7*   < > 8.9   < > 8.4* 8.6* 8.6*  ?MG 2.0  --   --   --   --  2.0 2.1  ?PHOS  --   --  2.8  --   --  2.7 2.9  ?GLUCOSE 113*   < > 162*   < > 103* 114* 113*  ? < > = values in this interval not displayed.  ? ?Diet Order:   ?Diet Order   ? ?       ?  Diet regular Room service appropriate? Yes; Fluid consistency: Thin  Diet effective now       ?  ? ?  ?  ? ?  ? ?EDUCATION NEEDS:  ?Education needs have been addressed ? ?Skin:  Skin Assessment: Reviewed RN Assessment ? ?Last BM:  4/17 ? ?Height:  ?Ht Readings from Last 1 Encounters:  ?06/11/21 _2  (1.803 m)  ? ?  Weight:  ?Wt Readings from Last 1 Encounters:  ?06/11/21 113.4 kg  ? ?Ideal Body Weight:  78.2 kg ? ?BMI:  Body mass index is 34.87 kg/m?. ? ?Estimated Nutritional Needs:  ?Kcal:  2000-2200 ?Protein:  100-115g ?Fluid:  >/=2L ? ? ?Theone Stanley., MS, RD, LDN (she/her/hers) ?RD pager number and weekend/on-call pager number located in [place]. ?

## 2021-06-20 NOTE — TOC Progression Note (Addendum)
Transition of Care (TOC) - Progression Note  ? ? ?Patient Details  ?Name: Michael [name] ?MRN: [id number] ?Date of Birth: [birth year] ? ?Transition of Care (TOC) CM/SW Contact  ?Coralee Pesa, LCSWA ?Phone Number: ?06/20/2021, 10:49 AM ? ?Clinical Narrative:    ? ?CSW met with pt at bedside to discuss SNF placement. Pt notes he has trouble reading and requests CSW assist. CSW discussed pt's options and he noted that he is interested in [facility] [facility], as it is close to his home. Pt requests CSW follow up with his wife, VM was left. CSW spoke with [facility] who is running pt's benefits. They will call back when information is obtained and auth started. TOC will continue to follow for DC needs. ? ?CSW received a return phone call from pt's spouse who is agreeable to [facility] and asked if pt would get rehab 3x a day. CSW noted she would ask when information was given about benefits. TOC will continue to follow for DC needs. ? ?2:30 CSW followed up with facility and admissions stated they would review now and contact CSW back. ?4:00 CSW left Vm for admissions ? ?Expected Discharge Plan: [place] ?Barriers to Discharge: Continued Medical Work up ? ?Expected Discharge Plan and Services ?Expected Discharge Plan: [place] ?  ?Discharge Planning Services: CM Consult ?  ?Living arrangements for the past 2 months: [place] ?                ?  ?  ?  ?  ?  ?HH Arranged: RN ?[place] Agency:  [facility]) ?  ?  ?  ? ? ?Social Determinants of Health (SDOH) Interventions ?  ? ?Readmission Risk Interventions ?   ? View : No data to display.  ?  ?  ?  ? ? ?

## 2021-06-21 ENCOUNTER — Inpatient Hospital Stay (HOSPITAL_COMMUNITY): Payer: BC Managed Care – PPO

## 2021-06-21 DIAGNOSIS — E6609 Other obesity due to excess calories: Secondary | ICD-10-CM | POA: Diagnosis not present

## 2021-06-21 DIAGNOSIS — B370 Candidal stomatitis: Secondary | ICD-10-CM | POA: Diagnosis not present

## 2021-06-21 DIAGNOSIS — M4644 Discitis, unspecified, thoracic region: Secondary | ICD-10-CM | POA: Diagnosis not present

## 2021-06-21 DIAGNOSIS — I10 Essential (primary) hypertension: Secondary | ICD-10-CM | POA: Diagnosis not present

## 2021-06-21 LAB — CBC WITH DIFFERENTIAL/PLATELET
Abs Immature Granulocytes: 0.13 10*3/uL — ABNORMAL HIGH (ref 0.00–0.07)
Basophils Absolute: 0 10*3/uL (ref 0.0–0.1)
Basophils Relative: 1 %
Eosinophils Absolute: 0.2 10*3/uL (ref 0.0–0.5)
Eosinophils Relative: 3 %
HCT: 38.8 % — ABNORMAL LOW (ref 39.0–52.0)
Hemoglobin: 13.3 g/dL (ref 13.0–17.0)
Immature Granulocytes: 2 %
Lymphocytes Relative: 16 %
Lymphs Abs: 1.2 10*3/uL (ref 0.7–4.0)
MCH: 29.8 pg (ref 26.0–34.0)
MCHC: 34.3 g/dL (ref 30.0–36.0)
MCV: 86.8 fL (ref 80.0–100.0)
Monocytes Absolute: 0.9 10*3/uL (ref 0.1–1.0)
Monocytes Relative: 12 %
Neutro Abs: 5.1 10*3/uL (ref 1.7–7.7)
Neutrophils Relative %: 66 %
Platelets: 209 10*3/uL (ref 150–400)
RBC: 4.47 MIL/uL (ref 4.22–5.81)
RDW: 14.1 % (ref 11.5–15.5)
WBC: 7.6 10*3/uL (ref 4.0–10.5)
nRBC: 0 % (ref 0.0–0.2)

## 2021-06-21 LAB — COMPREHENSIVE METABOLIC PANEL
ALT: 68 U/L — ABNORMAL HIGH (ref 0–44)
AST: 33 U/L (ref 15–41)
Albumin: 2.4 g/dL — ABNORMAL LOW (ref 3.5–5.0)
Alkaline Phosphatase: 99 U/L (ref 38–126)
Anion gap: 8 (ref 5–15)
BUN: 11 mg/dL (ref 6–20)
CO2: 25 mmol/L (ref 22–32)
Calcium: 8.7 mg/dL — ABNORMAL LOW (ref 8.9–10.3)
Chloride: 99 mmol/L (ref 98–111)
Creatinine, Ser: 0.97 mg/dL (ref 0.61–1.24)
GFR, Estimated: >60 mL/min (ref >60)
Glucose, Bld: 122 mg/dL — ABNORMAL HIGH (ref 70–99)
Potassium: 3.7 mmol/L (ref 3.5–5.1)
Sodium: 132 mmol/L — ABNORMAL LOW (ref 135–145)
Total Bilirubin: 0.8 mg/dL (ref 0.3–1.2)
Total Protein: 6.7 g/dL (ref 6.5–8.1)

## 2021-06-21 LAB — MAGNESIUM: Magnesium: 2.2 mg/dL (ref 1.7–2.4)

## 2021-06-21 LAB — PHOSPHORUS: Phosphorus: 2.8 mg/dL (ref 2.5–4.6)

## 2021-06-21 IMAGING — CT CT HEAD W/O CM
4 series · 15 of 47 positions shown, 17 images · non-contrast
Comparison: None.

CLINICAL DATA: Neuro deficit with acute stroke suspected



[Series 2: head wo · axial · 0.34mm/px · z∈[-190,-86]mm · 6 of 31 slices shown, 8 images]
[im 5/31  brain]
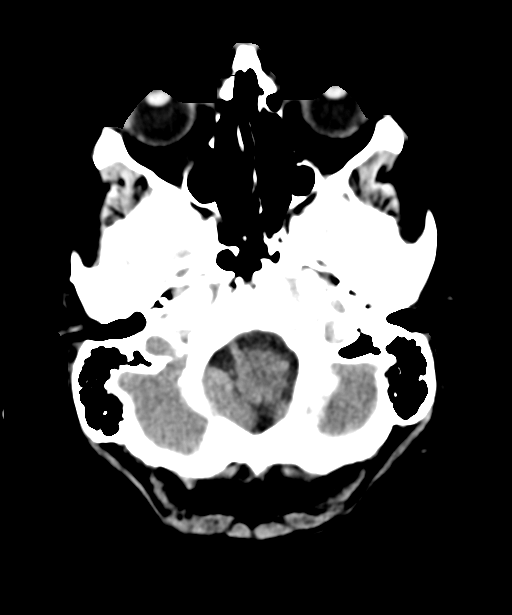
[im 5/31  bone]
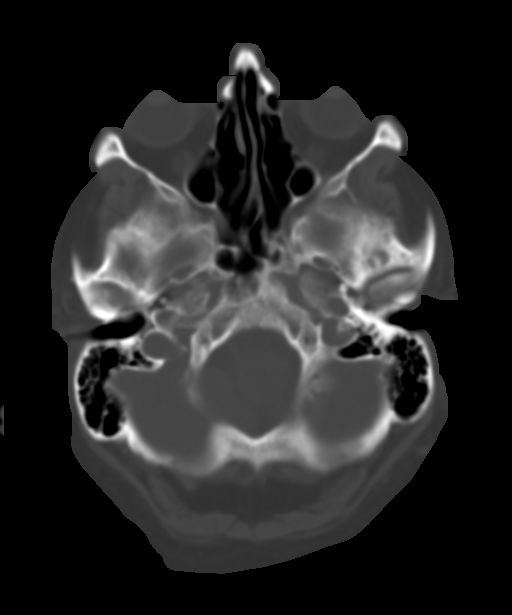
[im 9/31  brain]
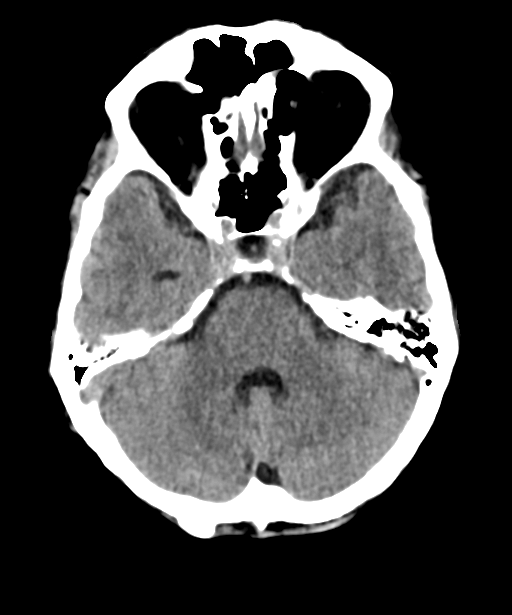
[im 13/31  brain]
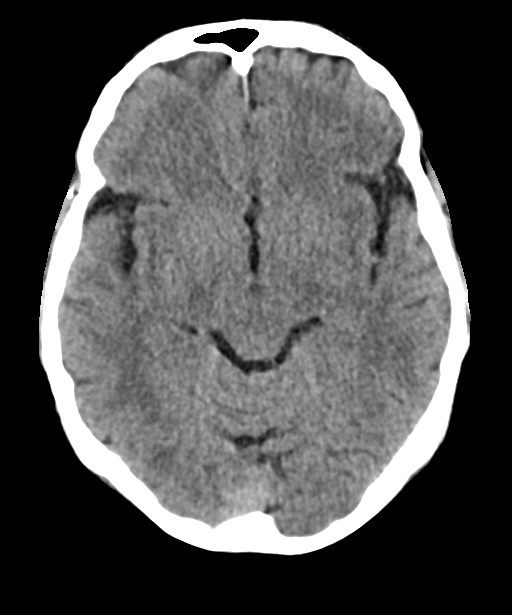
[im 18/31  brain]
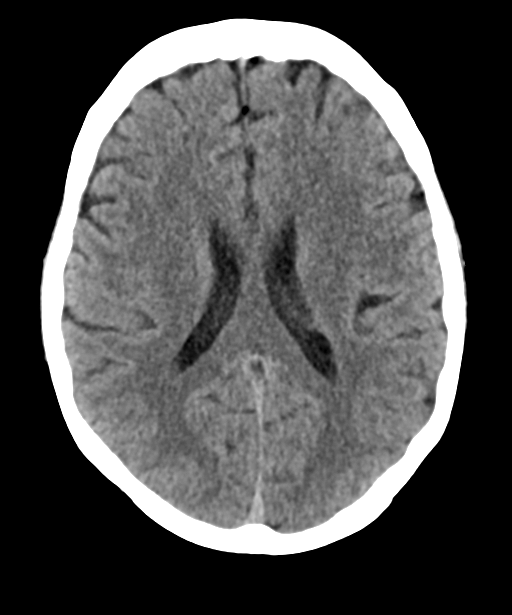
[im 22/31  brain]
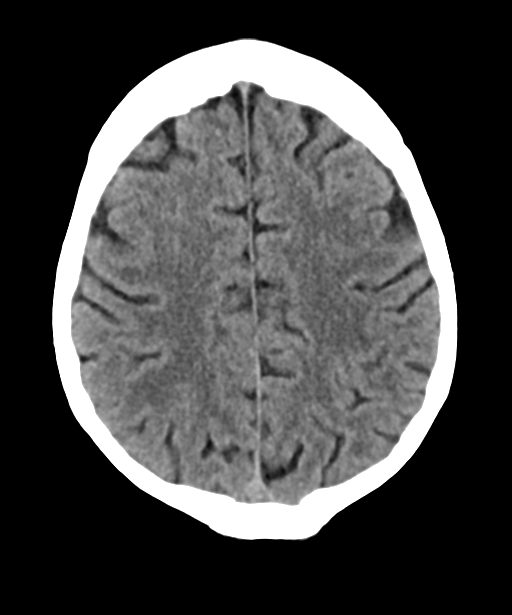
[im 22/31  bone]
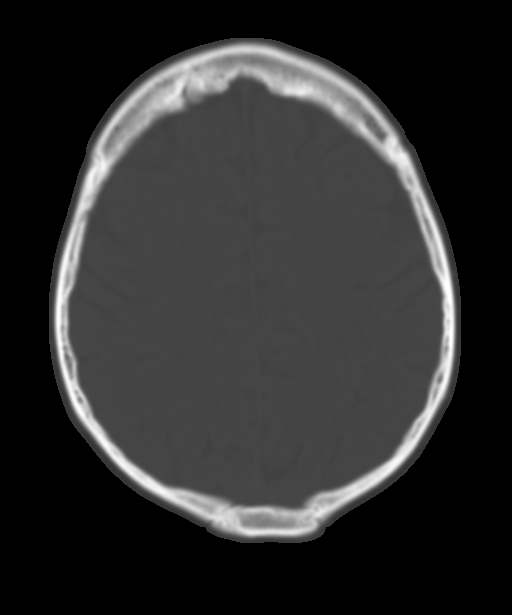
[im 26/31  brain]
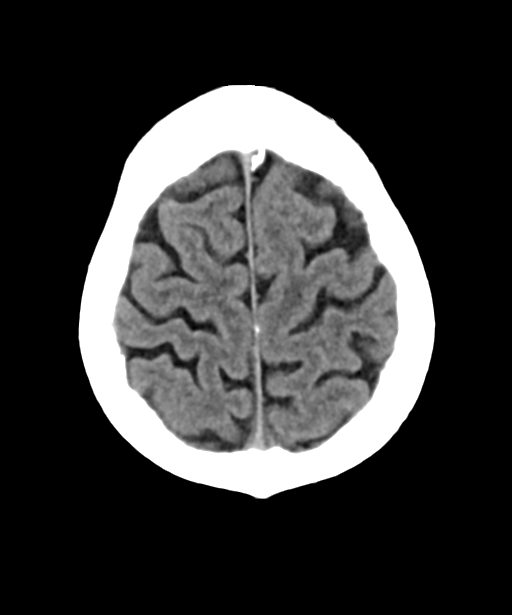

[Series 3: head bone · axial · 0.44mm/px · z∈[-176,-136]mm · 3 of 83 slices shown]
[im 8/83  bone]
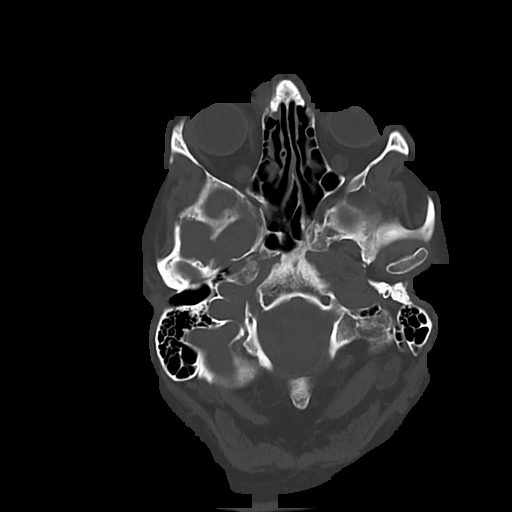
[im 16/83  bone]
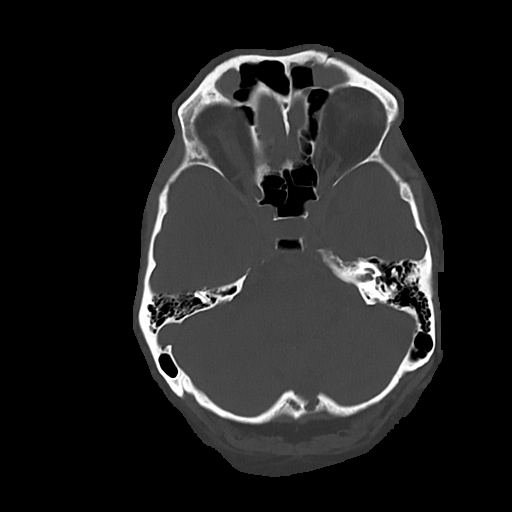
[im 28/83  bone]
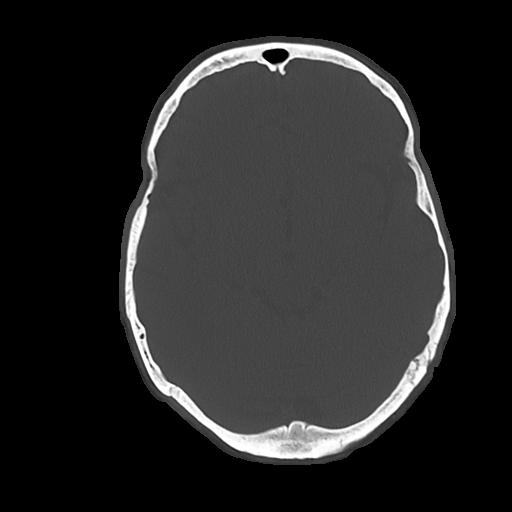

[Series 4: cor soft · coronal · 0.30mm/px · 3 of 70 slices shown]
[im 24/70  brain]
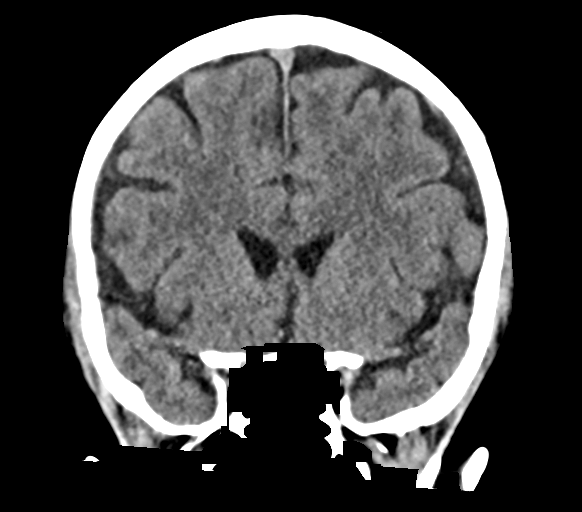
[im 31/70  brain]
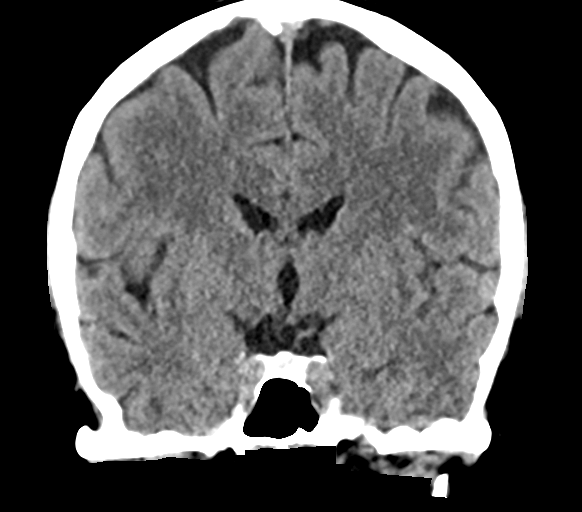
[im 39/70  brain]
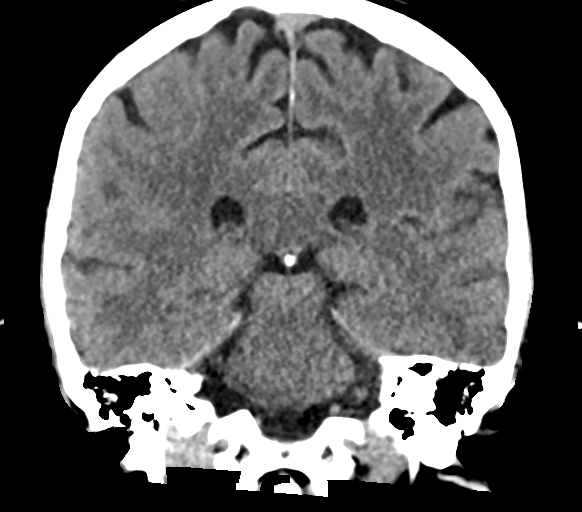

[Series 5: sag soft · sagittal · 0.34mm/px · 3 of 57 slices shown]
[im 20/57  brain]
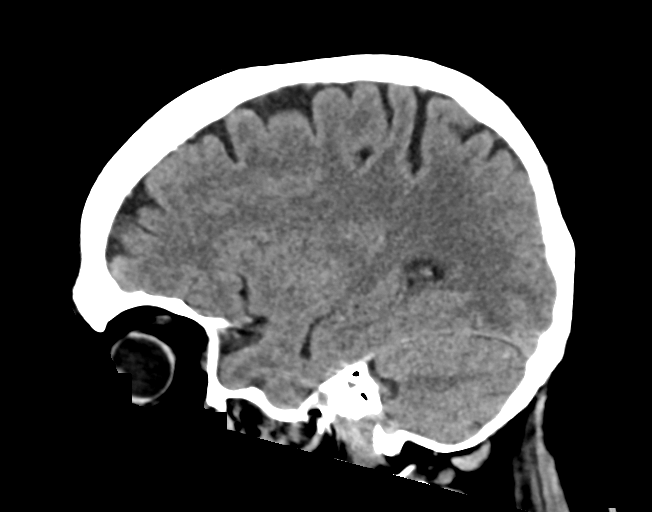
[im 29/57  brain]
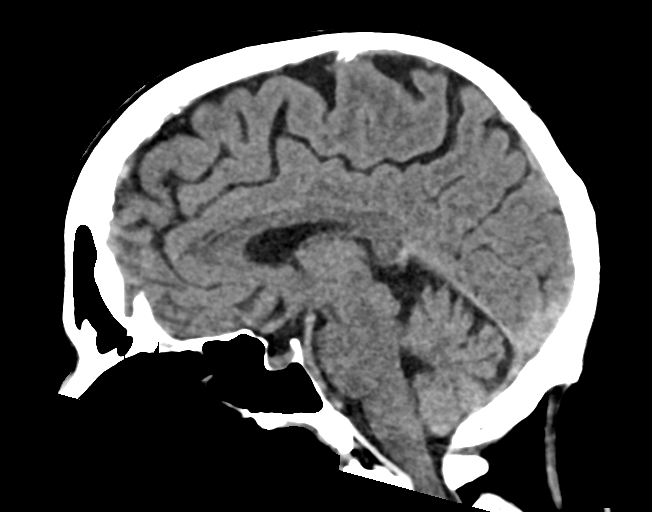
[im 37/57  brain]
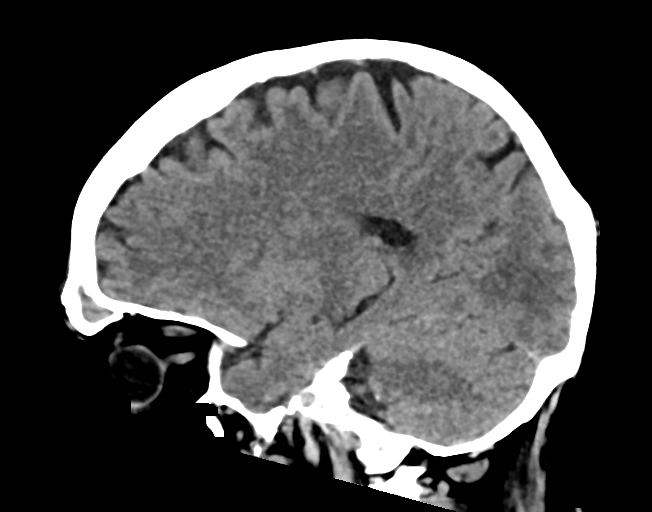

[15 of 47 positions shown; findings below may reference images not displayed]

FINDINGS: Brain: No evidence of acute infarction, hemorrhage, hydrocephalus,
extra-axial collection or mass lesion/mass effect.

Vascular: No hyperdense vessel or unexpected calcification.

Skull: Normal. Negative for fracture or focal lesion.

Sinuses/Orbits: Opacified lateral aspects of the bilateral frontal
sinus, incidental to the history.
IMPRESSION: No acute intracranial finding.

## 2021-06-21 NOTE — Progress Notes (Signed)
?PROGRESS NOTE ? ? ? ?Michael [name]  DPO:[id number] DOB: [birth year] DOA: 06/11/2021 ?PCP: [name], PA  ? ?Brief Narrative:  ?The patient. Michael [name] is a 61 year old male with past medical history significant for essential hypertension, obesity who presented to [facility] ED on 4/12 with progressive back pain. Patient also reports persistent nausea/vomiting; and has been seen at the [facility], ED on multiple occasions for such complaint.  Patient recently hospitalized from 3/17 - 3/22 with findings of T8-9 discitis and E. coli bacteremia following recent dental infection.  Patient was discharged on Ancef to continue through 06/27/2021. ?  ?In the ED, WBC count 12.3, hemoglobin 16.2, platelets 309.  Sodium 133, potassium 3.2, chloride 98, CO2 23, glucose 178, BUN 12, creatinine 0.99.  Lipase 41, AST 51, ALT 109.  Total bilirubin 0.8.  CRP 10.2, lactic acid 2.4.  Urinalysis unrevealing.  UDS positive for opiates.  CT abdomen/pelvis with interval worsening of known discitis/hospital-itis at T8-T9 with increased bony erosions and fragmentation of the endplates with increased paravertebral inflammatory changes and soft tissue density, moderate spinal canal stenosis, patchy atelectasis versus infiltrate lung bases, cholelithiasis, stable right adrenal adenoma, degenerative changes lumbar spine with severe spinal canal stenosis L2-3, L4-5.  MR T-spine with substantial progression T8-9 discitis/osteomyelitis since 3/17, eroded endplates with 25% loss of T8 vertebral body height since that time, probable septic facet joints, increased paraspinal phlegmon and can fluid epidural and dural thickening, increased spinal stenosis T8-9 spinal cord mass effect but with no cord edema.   ? ?Seen by neurosurgery, recommend medical management, continue IV antibiotics.  Infectious disease consulted and recommending continuing IV Ceftriaxone until 06/27/21.  Hospital service consulted for further evaluation and management of thoracic  spine progressive discitis/osteomyelitis. ? ?Given his Nausea and Vomiting, GI was consulted and offered EGD but patient refused and recommending Scheduled Zofran and PPI BID. Will advance Diet to Soft Diet today and continue to monitor LFTs. LFTs silightly elevated and patient has not had a Bowel Movement since Saturday but refusing bowel regimen but was agreeable to taking the bisacodyl. ? ?PT OT evaluated and recommended CIR but patient did not demonstrate the ability for intensive rehab required to admit to CIR so now recommendations for him go to SNF. Patient given SNF choices and [facility] clinicals and awaiting bed offer and [facility].    ? ?Assessment and Plan: ? ?T8-9 Discitis/Osteomyelitis ?Patient representing to the ED with progressive back pain.  Imaging findings significant for substantial progression of T8-9 discitis/osteomyelitis since 3/17, eroded endplates with 25% loss of T8 vertebral body height since that time, probable septic facet joints, increased paraspinal phlegmon and can fluid epidural and dural thickening, increased spinal stenosis T8-9 spinal cord mass effect but with no cord edema.  CRP elevated 10.2, lactic acid 2.5, WBC count 12.3.  Seen by neurosurgery, recommend medical management, continue IV antibiotics.  ?-WBC is now 7.6 ?--Infectious disease following, appreciate assistance ?-Abscess Cx done and showed "No growth aerobically or anaerobically" ?--Blood cultures x2: No growth x5 days ?--S/p T8-9 disc space aspiration by IR 4/14, culture with no growth x3 days ?-C'/w Ketorolac 15 mg mg IV every 8 hours x5 days but will need to watch Renal Fxn carefully  ?-C/w Oxycodone 5 mg every 4 hours PRN moderate pain ?-Continue Dilaudid 0.5 mg IV every 3 hours as needed severe breakthrough pain ?-C/w Ceftriaxone 2 g IV every 24 hours until 06/27/21 and per ID following  completion of IV Abx they can add an additional 2 weeks of  Cefadroxil and consider re-imaging at some point  ?-Patient has a follow up with Dr. Renold DonVu on 06/25/21  ?-PT/OT recommending CIR but not a candidate but will need to go to SNF ?  ?Nausea/Vomiting, improved  ?-Patient with episodic nausea/vomiting, seen in the ED several times.   ?-CT abdomen/pelvis with no bowel obstruction, no increased stool burden with notable small gallstones.  Etiology likely secondary to esophageal candidiasis with known current oral thrush versus atypical presentation of gallstones. ?-Seen by gastroenterology, offered EGD but patient declined.  GI now on standby. ?-C/w Protonix 40 mg IV q12h and getting po/IV Zofran 4 mg q8h ?--Full liquid diet, advance as tolerates and will try SOFT yesterday and will advance to Heart Healthy Diet  ?--Supportive care, antiemetics ?  ?Elevated LFTs, improving  ?-Etiology likely secondary to progressive infection from osteomyelitis versus medication effect with ceftriaxone.   ?-CK level below normal limits, hepatitis panel negative. ?-AST 51>>34>51>39 -> 47 -> 39 -> 35 -> 33 ?-ALT 109>>64>79>72 -> 84 -> 80 -> 73 -> 68 ?-Tbili Stable and normal at 0.7 x3 ?-Continue to trend LFTs and repeat CMP in the AM  ?  ?Essential Hypertension ?-Home regimen inclueds lisinopril-HCTZ 10-12.5 mg p.o. daily. ?-Was Currently Holding home antihypertensives currently due to borderline hypotension but now BP improving so may resume antihypertensives  ?-C/w Hydralazine 5 mg IV q4hprn HBP ?-Continue to monitor BP per Protocol  ?-Last BP reading was 138/99 ?  ?Oral thrush ?-C/w Nystatin 500,000 units BID but not taking it consistently so will place on Fluconazole with Pharmacy to Dose and is now on Fluconazole 200 mg IV Daily  ?  ?Hyponatremia ?-Mild. Na+ ranging from 131-134 daily with it being 132 this AM ?-Continue to Monitor and Trend and repeat CMP in the AM  ?  ?Constipation ?-Patient has been refusing bowel regimen and had not had a bowel movement in about a week ?-On Bisacodyl 5  mg po Daily PRN Moderate Constipation and refused his Bisacodyl 10 mg RC x1 ?-Continuing Miralax 17 gram po Daily PRN Mild Constipation ?-He was Refusing All other Laxatives and Bowel Regimen but now agreeable to Senna-Docusate 2 tab po BID  ?-I discussed with him about Enema or Suppository and he refuses these ?  ?Dental Pain ?-DG orthopantogram with multiple missing teeth with dental caries and periapical lucencies. ?-Will need Outpatient follow-up with dentist and he is currently getting Abx ?  ?Physical Deconditioning ?-PT/OT evaluation: Recommending SNF (Had recommended CIR but did not demonstrate the ability for intensive rehab) ?-[facility]NF options given and [facility] and currently is SNF placement is under review and informed authorization is not initiated as yet ?  ?Hypoalbuminemia ?-Patient's Albumin Level this AM is 2.4 again ?-Continue to Monitor and Trend ?-Will consult Nutrition for further evaluation for Supplementation ?  ?Bilateral Vision Blurriness ?-Patient states it has been going on for a weak and he does not wear glasses but upon talking with his wife he does have reading glasses and the wife thinks his vision has gotten weaker in the last few months ?-Check Head CT Done and Negative ?-Will need outpatient Evaluation with Opthalmology  ?  ?Obesity ?-Complicates overall prognosis and care ?-Estimated body mass index is 34.87 kg/m? as calculated from the following: ?  Height as of this encounter: 5\' 11"  (1.803 m). ?  Weight as of this encounter: 113.4 kg.  ?-Weight Loss and Dietary Counseling given and  discussed with patient needs for aggressive lifestyle changes/weight loss as this complicates all facets of care.   ?-Will need Close Outpatient follow-up with PCP.   ?-Was consulted and they are recommending discontinuing boost breeze and discontinue Ensure alive and adding Prosource plus p.o. 30 mL twice daily and continue multivitamins with minerals daily ?  ? ?DVT prophylaxis:  Place and maintain sequential compression device Start: 06/14/21 1223 ?SCDs Start: 06/12/21 1059 ? ?  Code Status: Full Code ?Family Communication: Discussed with wife at bedside  ? ?Disposition Plan:  ?L

## 2021-06-22 ENCOUNTER — Inpatient Hospital Stay (HOSPITAL_COMMUNITY): Payer: BC Managed Care – PPO

## 2021-06-22 DIAGNOSIS — M4644 Discitis, unspecified, thoracic region: Secondary | ICD-10-CM | POA: Diagnosis not present

## 2021-06-22 DIAGNOSIS — B370 Candidal stomatitis: Secondary | ICD-10-CM | POA: Diagnosis not present

## 2021-06-22 DIAGNOSIS — E6609 Other obesity due to excess calories: Secondary | ICD-10-CM | POA: Diagnosis not present

## 2021-06-22 DIAGNOSIS — K5900 Constipation, unspecified: Secondary | ICD-10-CM

## 2021-06-22 DIAGNOSIS — I10 Essential (primary) hypertension: Secondary | ICD-10-CM | POA: Diagnosis not present

## 2021-06-22 LAB — COMPREHENSIVE METABOLIC PANEL
ALT: 64 U/L — ABNORMAL HIGH (ref 0–44)
AST: 30 U/L (ref 15–41)
Albumin: 2.5 g/dL — ABNORMAL LOW (ref 3.5–5.0)
Alkaline Phosphatase: 103 U/L (ref 38–126)
Anion gap: 12 (ref 5–15)
BUN: 12 mg/dL (ref 6–20)
CO2: 22 mmol/L (ref 22–32)
Calcium: 8.6 mg/dL — ABNORMAL LOW (ref 8.9–10.3)
Chloride: 96 mmol/L — ABNORMAL LOW (ref 98–111)
Creatinine, Ser: 0.95 mg/dL (ref 0.61–1.24)
GFR, Estimated: >60 mL/min (ref >60)
Glucose, Bld: 120 mg/dL — ABNORMAL HIGH (ref 70–99)
Potassium: 3.7 mmol/L (ref 3.5–5.1)
Sodium: 130 mmol/L — ABNORMAL LOW (ref 135–145)
Total Bilirubin: 0.8 mg/dL (ref 0.3–1.2)
Total Protein: 6.9 g/dL (ref 6.5–8.1)

## 2021-06-22 LAB — CBC WITH DIFFERENTIAL/PLATELET
Abs Immature Granulocytes: 0.09 10*3/uL — ABNORMAL HIGH (ref 0.00–0.07)
Basophils Absolute: 0.1 10*3/uL (ref 0.0–0.1)
Basophils Relative: 1 %
Eosinophils Absolute: 0.1 10*3/uL (ref 0.0–0.5)
Eosinophils Relative: 2 %
HCT: 40.3 % (ref 39.0–52.0)
Hemoglobin: 13.5 g/dL (ref 13.0–17.0)
Immature Granulocytes: 1 %
Lymphocytes Relative: 18 %
Lymphs Abs: 1.2 10*3/uL (ref 0.7–4.0)
MCH: 29.5 pg (ref 26.0–34.0)
MCHC: 33.5 g/dL (ref 30.0–36.0)
MCV: 88.2 fL (ref 80.0–100.0)
Monocytes Absolute: 1.1 10*3/uL — ABNORMAL HIGH (ref 0.1–1.0)
Monocytes Relative: 17 %
Neutro Abs: 3.8 10*3/uL (ref 1.7–7.7)
Neutrophils Relative %: 61 %
Platelets: 211 10*3/uL (ref 150–400)
RBC: 4.57 MIL/uL (ref 4.22–5.81)
RDW: 14.4 % (ref 11.5–15.5)
WBC: 6.4 10*3/uL (ref 4.0–10.5)
nRBC: 0 % (ref 0.0–0.2)

## 2021-06-22 LAB — MAGNESIUM: Magnesium: 2 mg/dL (ref 1.7–2.4)

## 2021-06-22 LAB — PHOSPHORUS: Phosphorus: 2.6 mg/dL (ref 2.5–4.6)

## 2021-06-22 IMAGING — DX DG ABD PORTABLE 1V
1 series · 2 of 2 positions shown · non-contrast
Comparison: [DATE]

CLINICAL DATA: Abdominal distension

EXAM:
PORTABLE ABDOMEN - 1 VIEW

[Series 1: abdomen · 0.14mm/px · 2 of 2 slices shown]
[im 1/2]
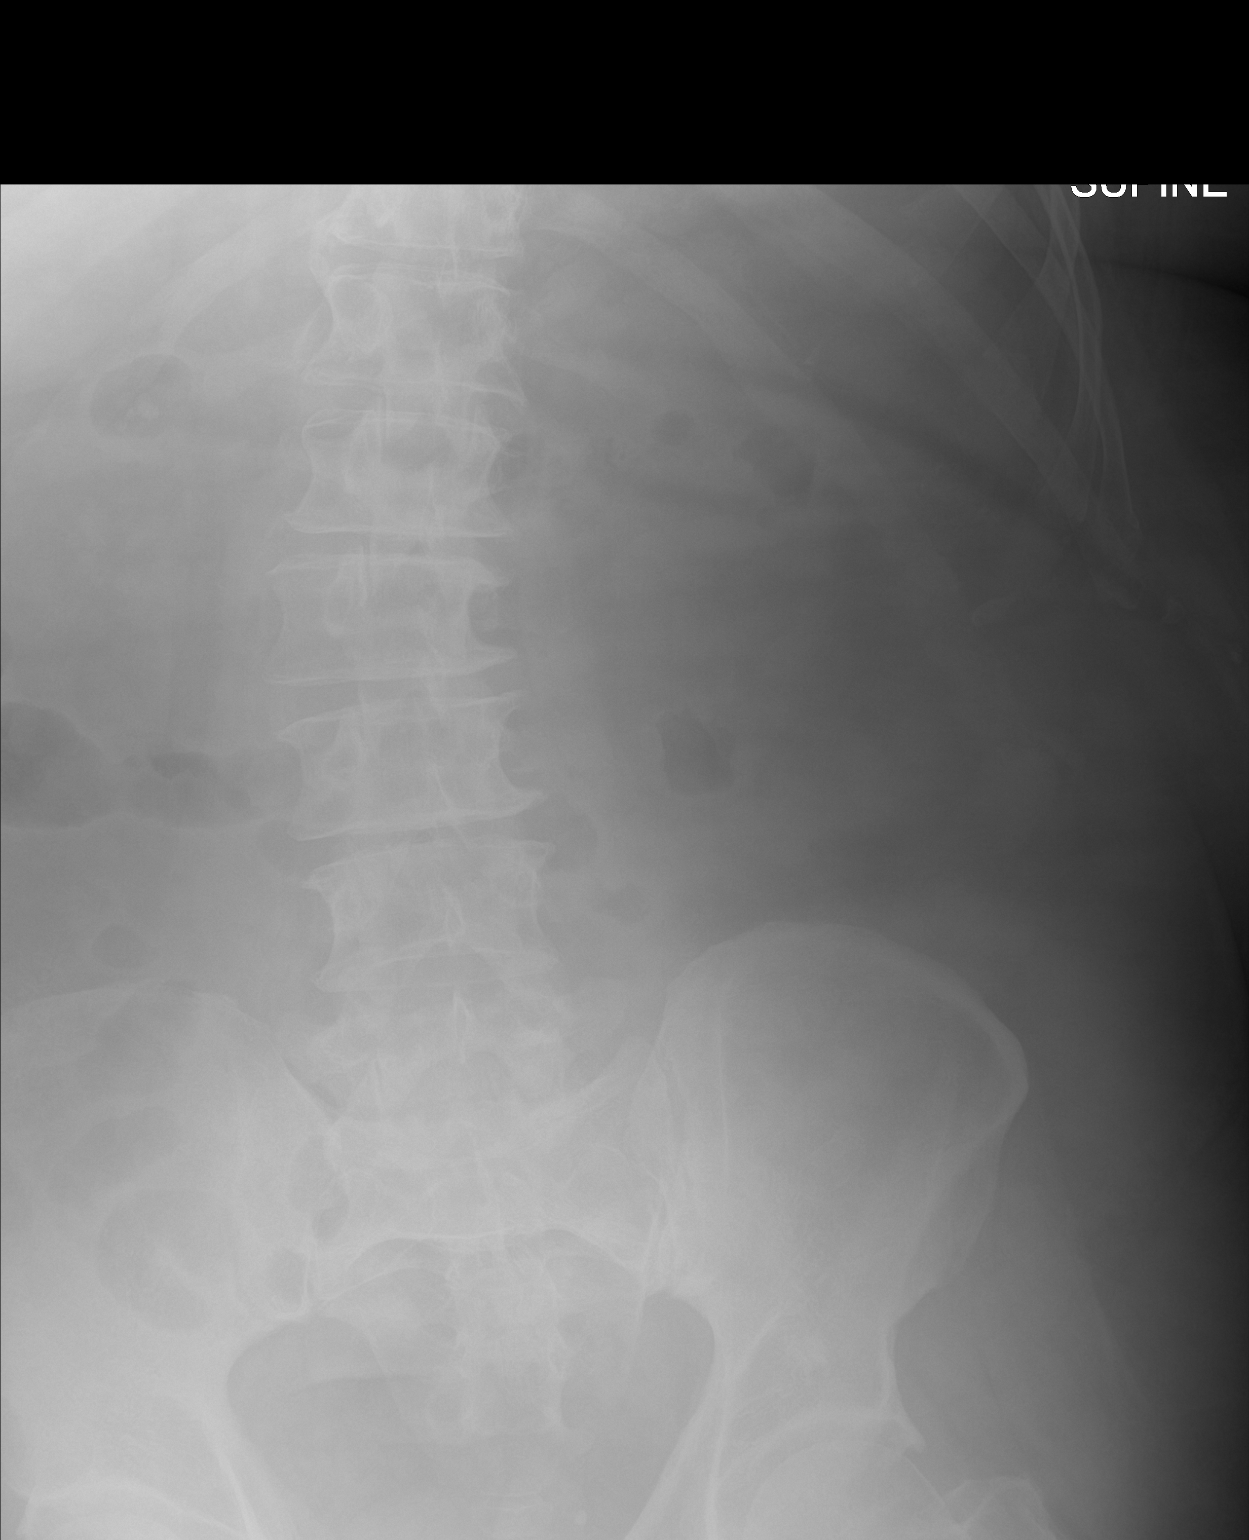
[im 2/2]
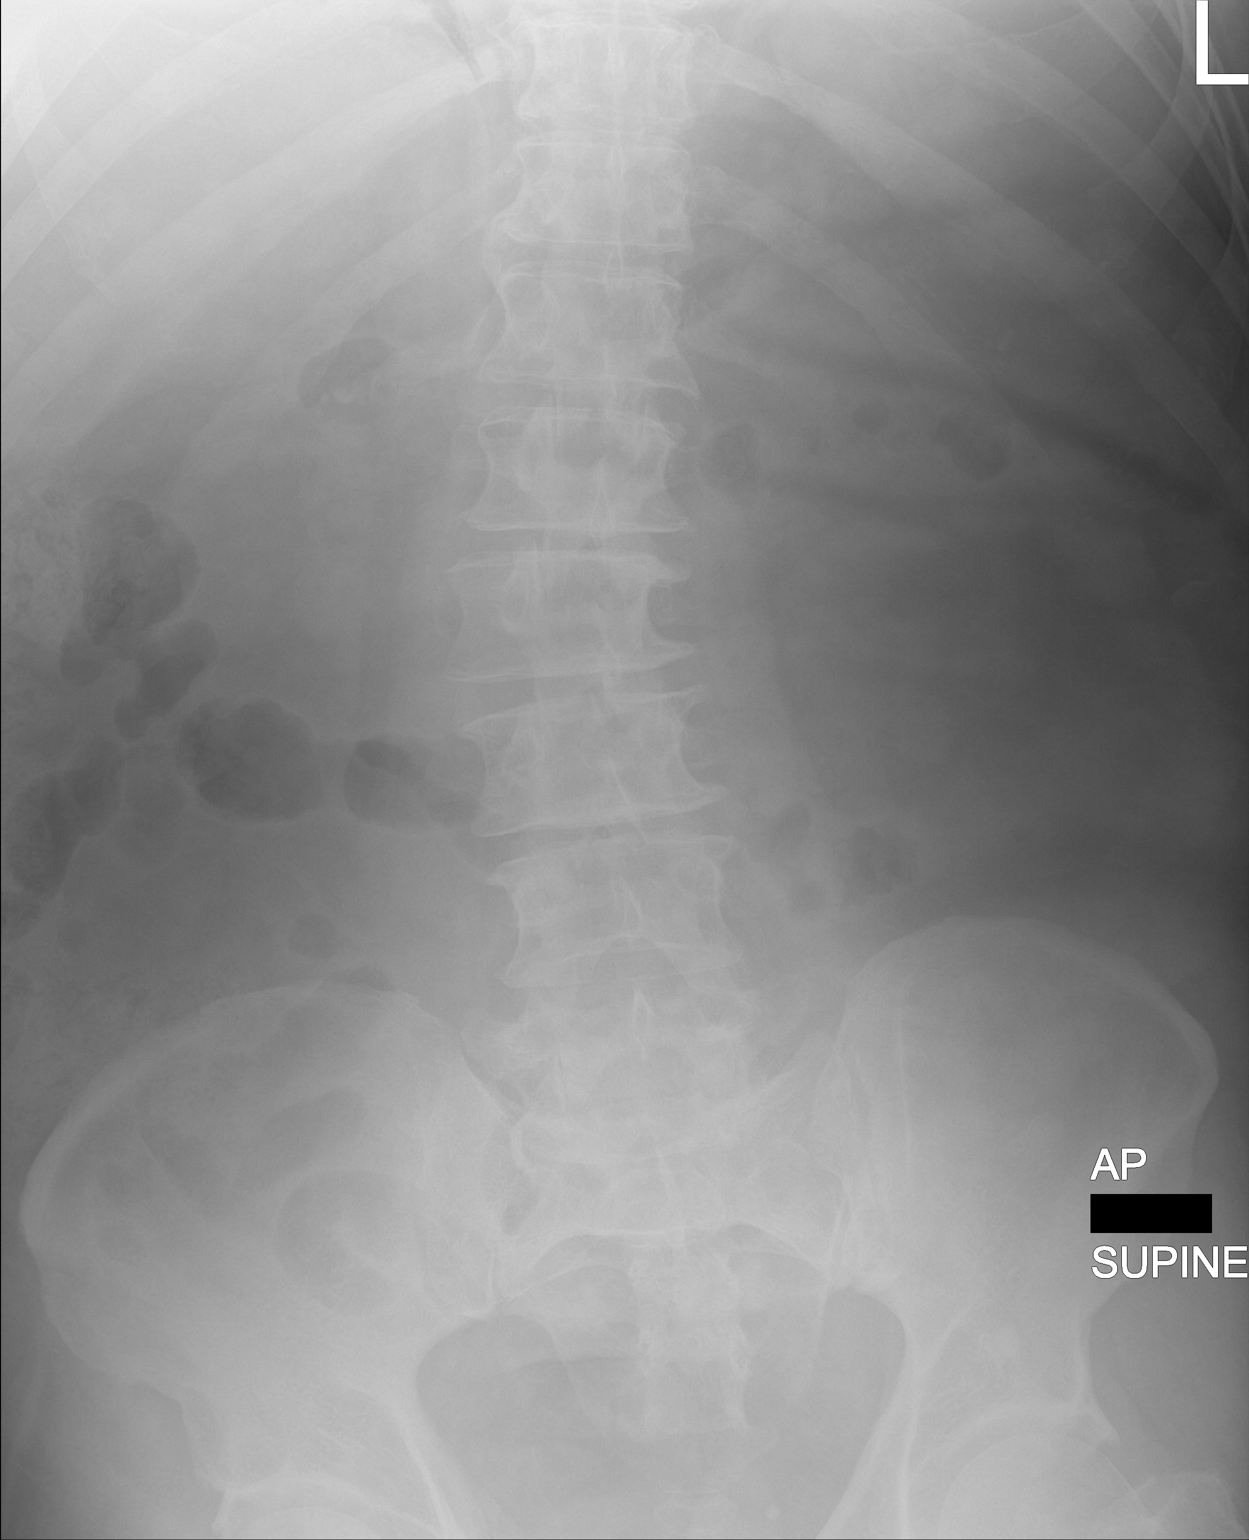

[2 of 2 positions shown; findings below may reference images not displayed]

FINDINGS: There is a non obstructive bowel gas pattern. No supine evidence of
free air. No organomegaly or suspicious calcification. No acute bony
abnormality. Mild scoliosis and degenerative changes in the lumbar
spine.
IMPRESSION: No acute findings.

## 2021-06-22 MED ORDER — SORBITOL 70 % SOLN
960.0000 mL | TOPICAL_OIL | Freq: Once | ORAL | Status: AC
  Administered 2021-06-22: 960 mL via RECTAL
  Filled 2021-06-22: qty 473

## 2021-06-22 MED ORDER — PROCHLORPERAZINE EDISYLATE 10 MG/2ML IJ SOLN
10.0000 mg | Freq: Four times a day (QID) | INTRAMUSCULAR | Status: DC | PRN
  Administered 2021-06-22: 10 mg via INTRAVENOUS
  Filled 2021-06-22: qty 2

## 2021-06-22 NOTE — Progress Notes (Signed)
Patient refusing to go to Xray this evening for ABD view. Patient did agree that he would do it portable.  ?

## 2021-06-22 NOTE — Plan of Care (Signed)
Patient is refusing to turn or mobilize. Patient given oral agents to assist in having a BM this AM and enema given this evening with small results. Patient does report that his distention is better since enema. Family at bedside encouraging patient as well to eat for proper nutrition.  ? ? ?Problem: Education: ?Goal: Knowledge of General Education information will improve ?Description: Including pain rating scale, medication(s)/side effects and non-pharmacologic comfort measures ?06/22/2021 1959 by Rosezetta Schlatter, RN ?Outcome: Not Progressing ?06/22/2021 1847 by Rosezetta Schlatter, RN ?Outcome: Progressing ?  ?Problem: Health Behavior/Discharge Planning: ?Goal: Ability to manage health-related needs will improve ?06/22/2021 1959 by Rosezetta Schlatter, RN ?Outcome: Not Progressing ?06/22/2021 1847 by Rosezetta Schlatter, RN ?Outcome: Not Progressing ?  ?Problem: Clinical Measurements: ?Goal: Ability to maintain clinical measurements within normal limits will improve ?06/22/2021 1959 by Rosezetta Schlatter, RN ?Outcome: Not Progressing ?06/22/2021 1847 by Rosezetta Schlatter, RN ?Outcome: Progressing ?Goal: Will remain free from infection ?06/22/2021 1959 by Rosezetta Schlatter, RN ?Outcome: Not Progressing ?06/22/2021 1847 by Rosezetta Schlatter, RN ?Outcome: Not Progressing ?Goal: Diagnostic test results will improve ?06/22/2021 1959 by Rosezetta Schlatter, RN ?Outcome: Not Progressing ?06/22/2021 1847 by Rosezetta Schlatter, RN ?Outcome: Progressing ?Goal: Respiratory complications will improve ?06/22/2021 1959 by Rosezetta Schlatter, RN ?Outcome: Progressing ?06/22/2021 1847 by Rosezetta Schlatter, RN ?Outcome: Progressing ?Goal: Cardiovascular complication will be avoided ?06/22/2021 1959 by Rosezetta Schlatter, RN ?Outcome: Progressing ?06/22/2021 1847 by Rosezetta Schlatter, RN ?Outcome: Progressing ?  ?Problem: Activity: ?Goal: Risk for activity intolerance will decrease ?06/22/2021 1959 by Rosezetta Schlatter, RN ?Outcome: Not Progressing ?06/22/2021 1847 by Rosezetta Schlatter, RN ?Outcome: Not Progressing ?  ?Problem:  Nutrition: ?Goal: Adequate nutrition will be maintained ?06/22/2021 1959 by Rosezetta Schlatter, RN ?Outcome: Not Progressing ?06/22/2021 1847 by Rosezetta Schlatter, RN ?Outcome: Not Progressing ?  ?Problem: Coping: ?Goal: Level of anxiety will decrease ?06/22/2021 1959 by Rosezetta Schlatter, RN ?Outcome: Not Progressing ?06/22/2021 1847 by Rosezetta Schlatter, RN ?Outcome: Not Progressing ?  ?Problem: Elimination: ?Goal: Will not experience complications related to bowel motility ?06/22/2021 1959 by Rosezetta Schlatter, RN ?Outcome: Not Progressing ?06/22/2021 1847 by Rosezetta Schlatter, RN ?Outcome: Not Progressing ?Goal: Will not experience complications related to urinary retention ?06/22/2021 1959 by Rosezetta Schlatter, RN ?Outcome: Progressing ?06/22/2021 1847 by Rosezetta Schlatter, RN ?Outcome: Progressing ?  ?Problem: Pain Managment: ?Goal: General experience of comfort will improve ?06/22/2021 1959 by Rosezetta Schlatter, RN ?Outcome: Progressing ?06/22/2021 1847 by Rosezetta Schlatter, RN ?Outcome: Not Progressing ?  ?Problem: Safety: ?Goal: Ability to remain free from injury will improve ?06/22/2021 1959 by Rosezetta Schlatter, RN ?Outcome: Progressing ?06/22/2021 1847 by Rosezetta Schlatter, RN ?Outcome: Not Progressing ?  ?Problem: Skin Integrity: ?Goal: Risk for impaired skin integrity will decrease ?06/22/2021 1959 by Rosezetta Schlatter, RN ?Outcome: Not Progressing ?06/22/2021 1847 by Rosezetta Schlatter, RN ?Outcome: Not Progressing ?  ?

## 2021-06-22 NOTE — Plan of Care (Incomplete)
Patient refusing to eat solid food, only wants to eat [place] ice, refusing to mobilize or turn.  Discussed with physician.  ?

## 2021-06-22 NOTE — Plan of Care (Signed)
  Problem: Education: Goal: Knowledge of General Education information will improve Description: Including pain rating scale, medication(s)/side effects and non-pharmacologic comfort measures Outcome: Progressing   Problem: Health Behavior/Discharge Planning: Goal: Ability to manage health-related needs will improve Outcome: Progressing   Problem: Clinical Measurements: Goal: Ability to maintain clinical measurements within normal limits will improve Outcome: Progressing Goal: Will remain free from infection Outcome: Progressing Goal: Diagnostic test results will improve Outcome: Progressing Goal: Respiratory complications will improve Outcome: Progressing Goal: Cardiovascular complication will be avoided Outcome: Progressing   Problem: Nutrition: Goal: Adequate nutrition will be maintained Outcome: Progressing   Problem: Coping: Goal: Level of anxiety will decrease Outcome: Progressing   Problem: Elimination: Goal: Will not experience complications related to bowel motility Outcome: Progressing Goal: Will not experience complications related to urinary retention Outcome: Progressing   Problem: Pain Managment: Goal: General experience of comfort will improve Outcome: Progressing   Problem: Skin Integrity: Goal: Risk for impaired skin integrity will decrease Outcome: Progressing   

## 2021-06-22 NOTE — Progress Notes (Signed)
?PROGRESS NOTE ? ? ? ?Michael [name]  QVZ:[id number] DOB: [birth year] DOA: 06/11/2021 ?PCP: [name], PA  ? ?Brief Narrative:  ?The patient. Michael [name] is a 61 year old male with past medical history significant for essential hypertension, obesity who presented to [facility] ED on 4/12 with progressive back pain. Patient also reports persistent nausea/vomiting; and has been seen at the [facility] ED on multiple occasions for such complaint.  Patient recently hospitalized from 3/17 - 3/22 with findings of T8-9 discitis and E. coli bacteremia following recent dental infection.  Patient was discharged on Ancef to continue through 06/27/2021. ?  ?In the ED, WBC count 12.3, hemoglobin 16.2, platelets 309.  Sodium 133, potassium 3.2, chloride 98, CO2 23, glucose 178, BUN 12, creatinine 0.99.  Lipase 41, AST 51, ALT 109.  Total bilirubin 0.8.  CRP 10.2, lactic acid 2.4.  Urinalysis unrevealing.  UDS positive for opiates.  CT abdomen/pelvis with interval worsening of known discitis/hospital-itis at T8-T9 with increased bony erosions and fragmentation of the endplates with increased paravertebral inflammatory changes and soft tissue density, moderate spinal canal stenosis, patchy atelectasis versus infiltrate lung bases, cholelithiasis, stable right adrenal adenoma, degenerative changes lumbar spine with severe spinal canal stenosis L2-3, L4-5.  MR T-spine with substantial progression T8-9 discitis/osteomyelitis since 3/17, eroded endplates with 25% loss of T8 vertebral body height since that time, probable septic facet joints, increased paraspinal phlegmon and can fluid epidural and dural thickening, increased spinal stenosis T8-9 spinal cord mass effect but with no cord edema.   ? ?Seen by neurosurgery, recommend medical management, continue IV antibiotics.  Infectious disease consulted and recommending continuing IV Ceftriaxone until 06/27/21.  Hospital service consulted for further evaluation and management of thoracic  spine progressive discitis/osteomyelitis. ? ?Given his Nausea and Vomiting, GI was consulted and offered EGD but patient refused and recommending Scheduled Zofran and PPI BID. Will advance Diet to Soft Diet today and continue to monitor LFTs. LFTs silightly elevated and patient has not had a Bowel Movement since Saturday but refusing bowel regimen but was agreeable to taking the bisacodyl. ? ?PT OT evaluated and recommended CIR but patient did not demonstrate the ability for intensive rehab required to admit to CIR so now recommendations for him go to SNF. Patient given SNF choices and [facility] clinicals and awaiting bed offer and [facility].    ? ?Assessment and Plan: ? ?T8-9 Discitis/Osteomyelitis ?Patient representing to the ED with progressive back pain.  Imaging findings significant for substantial progression of T8-9 discitis/osteomyelitis since 3/17, eroded endplates with 25% loss of T8 vertebral body height since that time, probable septic facet joints, increased paraspinal phlegmon and can fluid epidural and dural thickening, increased spinal stenosis T8-9 spinal cord mass effect but with no cord edema.  CRP elevated 10.2, lactic acid 2.5, WBC count 12.3.  Seen by neurosurgery, recommend medical management, continue IV antibiotics.  ?-WBC is now 6.4 ?-Infectious disease following, appreciate assistance ?-Abscess Cx done and showed "No growth aerobically or anaerobically" ?-Blood cultures x2: No growth x5 days ?-S/p T8-9 disc space aspiration by IR 4/14, culture with no growth x3 days ?-C'/w Ketorolac 15 mg mg IV every 8 hours x5 days but will need to watch Renal Fxn carefully  ?-C/w Oxycodone 5 mg every 4 hours PRN moderate pain ?-Continue Dilaudid 0.5 mg IV every 3 hours as needed severe breakthrough pain ?-C/w Ceftriaxone 2 g IV every 24 hours until 06/27/21 and per ID following  completion of IV Abx they can add an additional 2 weeks of  Cefadroxil and consider re-imaging at some point  ?-Patient has a follow up with Dr. Renold Don on 06/25/21  ?-PT/OT recommending CIR but not a candidate but will need to go to SNF ?  ?Nausea/Vomiting ?-Patient with episodic nausea/vomiting, seen in the ED several times. Vomited this AM  ?-CT abdomen/pelvis with no bowel obstruction, no increased stool burden with notable small gallstones.  Etiology likely secondary to esophageal candidiasis with known current oral thrush versus atypical presentation of gallstones. ?-Seen by gastroenterology, offered EGD but patient declined.  GI now on standby. ?-C/w Ondansetron 4 mg po/IV q8h and will add Compazine 10 mg IV q6hprn, Nausea, Vomiting, Refractory Nausea and Vomiting ?-C/w Protonix 40 mg IV q12h and getting po/IV Zofran 4 mg q8h ?-Check KUB this Afternoon ?-Diet advanced but patient is afraid of eating given his "queasiness"  ?--Supportive care, antiemetics ?  ?Elevated LFTs, improving  ?-Etiology likely secondary to progressive infection from osteomyelitis versus medication effect with ceftriaxone.   ?-CK level below normal limits, hepatitis panel negative. ?-AST 51>>34>51>39 -> 47 -> 39 -> 35 -> 33 -> 30 ?-ALT 109>>64>79>72 -> 84 -> 80 -> 73 -> 68 -> 64 ?-Tbili Stable and normal at 0.7 x3 ?-Continue to trend LFTs and repeat CMP in the AM  ?  ?Essential Hypertension ?-Home regimen inclueds lisinopril-HCTZ 10-12.5 mg p.o. daily. ?-Was Currently Holding home antihypertensives currently due to borderline hypotension but now BP improving so may resume antihypertensives  ?-C/w Hydralazine 5 mg IV q4hprn HBP ?-Continue to monitor BP per Protocol  ?-Last BP reading was 141/101 ?  ?Oral thrush ?-C/w Nystatin 500,000 units BID but not taking it consistently so will place on Fluconazole with Pharmacy to Dose and is now on Fluconazole 200 mg IV Daily  ?  ?Hyponatremia ?-Mild. Na+ ranging from 130-134 daily with it being 130 this AM ?-Continue to Monitor and Trend and repeat CMP in the  AM  ?  ?Constipation ?-Patient has been refusing bowel regimen and had not had a bowel movement in about a week ?-On Bisacodyl 5 mg po Daily PRN Moderate Constipation and refused his Bisacodyl 10 mg RC x1 but maybe open to an ENEMA; Will order a SMOG ?-Continuing Miralax 17 gram po Daily PRN Mild Constipation ?-He was Refusing All other Laxatives and Bowel Regimen but now agreeable to Senna-Docusate 2 tab po BID  ?-I discussed with him about Enema or Suppository and he refuses these but maybe open to it so will order a SMOG ?  ?Dental Pain ?-DG orthopantogram with multiple missing teeth with dental caries and periapical lucencies. ?-Will need Outpatient follow-up with dentist and he is currently getting Abx ?  ?Physical Deconditioning ?-PT/OT evaluation: Recommending SNF (Had recommended CIR but did not demonstrate the ability for intensive rehab) ?-SNF options given and patient prefers [facility] and currently is SNF placement is under review and informed authorization is not initiated as yet ?  ?Hypoalbuminemia ?-Patient's Albumin Level this AM is 2.5  ?-Continue to Monitor and Trend ?-Will consult Nutrition for further evaluation for Supplementation ?  ?Bilateral Vision Blurriness, stable ?-Patient states it has been going on for a weak and he does not wear glasses but upon talking with his wife he does have reading glasses and the wife thinks his vision has gotten weaker in the last few months ?-Check Head CT Done and Negative ?-Will need outpatient Evaluation with Opthalmology  ?  ?Obesity ?-Complicates overall prognosis  and care ?-Estimated body mass index is 34.87 kg/m? as calculated from the following: ?  Height as of this encounter:  (1.803 m). ?  Weight as of this encounter: 113.4 kg.  ?-Weight Loss and Dietary Counseling given and discussed with patient needs for aggressive lifestyle changes/weight loss as this complicates all facets of care.   ?-Will need Close Outpatient follow-up with PCP.    ?-Was consulted and they are recommending discontinuing boost breeze and discontinue Ensure alive and adding Prosource plus p.o. 30 mL twice daily and continue multivitamins with minerals daily ? ?DVT prop

## 2021-06-23 DIAGNOSIS — M4644 Discitis, unspecified, thoracic region: Secondary | ICD-10-CM | POA: Diagnosis not present

## 2021-06-23 DIAGNOSIS — E6609 Other obesity due to excess calories: Secondary | ICD-10-CM | POA: Diagnosis not present

## 2021-06-23 DIAGNOSIS — B370 Candidal stomatitis: Secondary | ICD-10-CM | POA: Diagnosis not present

## 2021-06-23 DIAGNOSIS — I10 Essential (primary) hypertension: Secondary | ICD-10-CM | POA: Diagnosis not present

## 2021-06-23 DIAGNOSIS — K625 Hemorrhage of anus and rectum: Secondary | ICD-10-CM

## 2021-06-23 DIAGNOSIS — L8990 Pressure ulcer of unspecified site, unspecified stage: Secondary | ICD-10-CM | POA: Insufficient documentation

## 2021-06-23 LAB — TYPE AND SCREEN
ABO/RH(D): O POS
Antibody Screen: NEGATIVE

## 2021-06-23 LAB — COMPREHENSIVE METABOLIC PANEL
ALT: 60 U/L — ABNORMAL HIGH (ref 0–44)
AST: 31 U/L (ref 15–41)
Albumin: 2.5 g/dL — ABNORMAL LOW (ref 3.5–5.0)
Alkaline Phosphatase: 99 U/L (ref 38–126)
Anion gap: 8 (ref 5–15)
BUN: 12 mg/dL (ref 6–20)
CO2: 24 mmol/L (ref 22–32)
Calcium: 8.6 mg/dL — ABNORMAL LOW (ref 8.9–10.3)
Chloride: 99 mmol/L (ref 98–111)
Creatinine, Ser: 0.89 mg/dL (ref 0.61–1.24)
GFR, Estimated: >60 mL/min (ref >60)
Glucose, Bld: 126 mg/dL — ABNORMAL HIGH (ref 70–99)
Potassium: 3.7 mmol/L (ref 3.5–5.1)
Sodium: 131 mmol/L — ABNORMAL LOW (ref 135–145)
Total Bilirubin: 0.7 mg/dL (ref 0.3–1.2)
Total Protein: 6.7 g/dL (ref 6.5–8.1)

## 2021-06-23 LAB — CBC
HCT: 40.7 % (ref 39.0–52.0)
Hemoglobin: 13.6 g/dL (ref 13.0–17.0)
MCH: 29.2 pg (ref 26.0–34.0)
MCHC: 33.4 g/dL (ref 30.0–36.0)
MCV: 87.5 fL (ref 80.0–100.0)
Platelets: 203 10*3/uL (ref 150–400)
RBC: 4.65 MIL/uL (ref 4.22–5.81)
RDW: 14.5 % (ref 11.5–15.5)
WBC: 6.4 10*3/uL (ref 4.0–10.5)
nRBC: 0 % (ref 0.0–0.2)

## 2021-06-23 LAB — HEMOGLOBIN AND HEMATOCRIT, BLOOD
HCT: 41.3 % (ref 39.0–52.0)
Hemoglobin: 14 g/dL (ref 13.0–17.0)

## 2021-06-23 LAB — ABO/RH: ABO/RH(D): O POS

## 2021-06-23 LAB — PROTIME-INR
INR: 1.3 — ABNORMAL HIGH (ref 0.8–1.2)
Prothrombin Time: 15.6 seconds — ABNORMAL HIGH (ref 11.4–15.2)

## 2021-06-23 MED ORDER — SORBITOL 70 % SOLN
960.0000 mL | TOPICAL_OIL | Freq: Once | ORAL | Status: DC
  Filled 2021-06-23: qty 473

## 2021-06-23 NOTE — Progress Notes (Signed)
Physical Therapy Treatment ?Patient Details ?Name: Michael [name] ?MRN: [id number] ?DOB: [birth year] ?Today's Date: 06/23/2021 ? ? ?History of Present Illness Michael [name] is a 61 y.o. male who presented to ED c/o back pain x 2 weeks. Pt was admitted for T8-T9 osteomyelitis. PMH: HTN, obesity, GERD ? ?  ?PT Comments  ? ? Pt still with painful back and overall self limiting.  Pt is able to mobilize much better than he lets on.  Emphasis on safe transition to EOB with less use of the raised HOB, safe sit to stands and progression of gait stability/stamina. ?   ?Recommendations for follow up therapy are one component of a multi-disciplinary discharge planning process, led by the attending physician.  Recommendations may be updated based on patient status, additional functional criteria and insurance authorization. ? ?Follow Up Recommendations ? Skilled nursing-short term rehab (<3 hours/day) ?  ?  ?Assistance Recommended at Discharge Frequent or constant Supervision/Assistance  ?Patient can return home with the following A little help with walking and/or transfers;A lot of help with bathing/dressing/bathroom;Assistance with cooking/housework;Assist for transportation;Help with stairs or ramp for entrance;Other (comment) ?  ?Equipment Recommendations ?    ?  ?Recommendations for Other Services   ? ? ?  ?Precautions / Restrictions Precautions ?Precautions: Fall ?Precaution Comments: morbid obesity ?Restrictions ?Other Position/Activity Restrictions: self limiting due to back pain  ?  ? ?Mobility ? Bed Mobility ?Overal bed mobility: Needs Assistance ?  ?  ?  ?Supine to sit: Min guard, HOB elevated ?  ?  ?General bed mobility comments: pt would not allow HOB to be lowered, he walked his feet off the bed and came forward off raised HOB. ?  ? ?Transfers ?Overall transfer level: Needs assistance ?Equipment used: Rolling walker (2 wheels) ?Transfers: Sit to/from Stand ?Sit to Stand: Min guard ?  ?  ?  ?  ?  ?  ?   ? ?Ambulation/Gait ?Ambulation/Gait assistance: Min guard ?Gait Distance (Feet): 15 Feet ?Assistive device: Rolling walker (2 wheels) ?Gait Pattern/deviations: Step-through pattern, Decreased step length - right, Decreased step length - left, Decreased stride length ?  ?Gait velocity interpretation: 1.31 - 2.62 ft/sec, indicative of limited community ambulator ?  ?General Gait Details: Again self limiting.  Pt not willing to get out into the halls.  Gait was relatively steady and with cuing, pt used the RW safely ? ? ?Stairs ?  ?  ?  ?  ?  ? ? ?Wheelchair Mobility ?  ? ?Modified Rankin (Stroke Patients Only) ?  ? ? ?  ?Balance Overall balance assessment: Mild deficits observed, not formally tested ?  ?  ?  ?  ?  ?  ?  ?  ?  ?  ?  ?  ?  ?  ?  ?  ?  ?  ?  ? ?  ?Cognition Arousal/Alertness: Awake/alert ?Behavior During Therapy: Flat affect ?Overall Cognitive Status: Within Functional Limits for tasks assessed ?  ?  ?  ?  ?  ?  ?  ?  ?  ?  ?  ?  ?  ?  ?  ?  ?  ?  ?  ? ?  ?Exercises   ? ?  ?General Comments   ?  ?  ? ?Pertinent Vitals/Pain Pain Assessment ?Faces Pain Scale: Hurts little more ?Pain Location: back pain ?Pain Descriptors / Indicators: Discomfort, Grimacing, Sore ?Pain Intervention(s): Monitored during session, Premedicated before session  ? ? ?Home Living   ?  ?  ?  ?  ?  ?  ?  ?  ?  ?   ?  ?  Prior Function    ?  ?  ?   ? ?PT Goals (current goals can now be found in the care plan section) Acute Rehab PT Goals ?PT Goal Formulation: With patient ?Time For Goal Achievement: 07/08/21 ?Potential to Achieve Goals: Good ?Progress towards PT goals: Progressing toward goals ? ?  ?Frequency ? ? ? Min 3X/week ? ? ? ?  ?PT Plan Current plan remains appropriate  ? ? ?Co-evaluation   ?  ?  ?  ?  ? ?  ?AM-PAC PT "6 Clicks" Mobility   ?Outcome Measure ? Help needed turning from your back to your side while in a flat bed without using bedrails?: A Little ?Help needed moving from lying on your back to sitting on the side of  a flat bed without using bedrails?: A Little ?Help needed moving to and from a bed to a chair (including a wheelchair)?: A Little ?Help needed standing up from a chair using your arms (e.g., wheelchair or bedside chair)?: A Little ?Help needed to walk in hospital room?: A Little ?Help needed climbing 3-5 steps with a railing? : A Lot ?6 Click Score: 17 ? ?  ?End of Session   ?Activity Tolerance: Patient tolerated treatment well;Patient limited by fatigue ?Patient left: in bed;with call bell/phone within reach ?Nurse Communication: Mobility status ?PT Visit Diagnosis: Unsteadiness on feet (R26.81);Repeated falls (R29.6);Difficulty in walking, not elsewhere classified (R26.2) ?  ? ? ?Time: 2637-8588 ?PT Time Calculation (min) (ACUTE ONLY): 21 min ? ?Charges:  $Gait Training: 8-22 mins          ?          ? ?06/23/2021 ? ?Jacinto Halim., PT ?Acute Rehabilitation Services ?[phone]  (pager) ?[phone]  (office) ? ? ?Michael [name] ?06/23/2021, 4:55 PM ? ?

## 2021-06-23 NOTE — Plan of Care (Signed)
  Problem: Education: Goal: Knowledge of General Education information will improve Description Including pain rating scale, medication(s)/side effects and non-pharmacologic comfort measures Outcome: Progressing   

## 2021-06-23 NOTE — Progress Notes (Signed)
TRH night cross cover note: ? ?I was notified by RN given that the patient has had a bloody bowel movement.  She conveyed that the patient experienced his first bowel movement in 8 days during dayshift, followed by this watery bowel movement associated with some bright red blood.  No subsequent bleeding per rectum.  The patient is without acute complaint at this time, including no abdominal pain, chest pain, shortness of breath, dizziness.  He is not on any blood thinners, including no aspirin or pharmacologic DVT prophylaxis.  Vital signs stable, without any evidence of hypotension or tachycardia. ? ?Per my chart review, there is documentation that he has refused GI recommendation for endoscopic evaluation during this hospitalization.  His most recent hemoglobin was noted to be 13.5 on the morning of 06/22/2021.  Additionally, labs at that time also demonstrated a stable, nonelevated BUN of 12 compared to a value of 11 on the day prior. ? ?I have ordered repeat CBC along with CMP, including to monitor trend in BUN level.  Also ordered INR as well as type and screen.  Continue to hold pharmacologic DVT prophylaxis, while proceeding with SCDs. ? ? ?Newton Pigg, DO ?Hospitalist ? ?

## 2021-06-23 NOTE — TOC Progression Note (Addendum)
Transition of Care (TOC) - Progression Note  ? ? ?Patient Details  ?Name: Michael [name] ?MRN: [id number] ?Date of Birth: 1960-10-31 ? ?Transition of Care (TOC) CM/SW Contact  ?Khamia Stambaugh Aris Lot, LCSW ?Phone Number: ?06/23/2021, 9:58 AM ? ?Clinical Narrative:    ? ?CSW sent message to Debbie with [facility] requesting update on insurance auth; awaiting response.  ? ?1057: CSW called Debbie again. No answer;left voicemail. ? ?1217: Called main number and requested to speak with admissions. CSW informed he would need to talk with Eunice Blase and is transferred to her office phone. No answer;left voicemail requesting return call.  ? ?1347: CSW called main number again and explained that CSW has not received call backs. Secretary takes CSW's contact info and states she will notify Eunice Blase to give a call back regarding pt's auth status.  ? ?Expected Discharge Plan: Skilled Nursing Facility ?Barriers to Discharge: Insurance Authorization ? ?Expected Discharge Plan and Services ?Expected Discharge Plan: Skilled Nursing Facility ?  ?Discharge Planning Services: CM Consult ?  ?Living arrangements for the past 2 months: Single Family Home ?                ?  ?  ?  ?  ?  ?HH Arranged: RN ?HH Agency:  Scientist, physiological) ?  ?  ?  ? ? ?Social Determinants of Health (SDOH) Interventions ?  ? ?Readmission Risk Interventions ?   ? View : No data to display.  ?  ?  ?  ? ? ?

## 2021-06-23 NOTE — Progress Notes (Addendum)
Patient had a BM,  bright red watery stool.  Pt denies any abdominal pain,  vital signs is unremarkable.  Dr. Marilynn Rail notified of this episode,  will order lab work  CBC,   BMET and type and screen.  Will continue to monitor pt closely ?

## 2021-06-23 NOTE — Progress Notes (Signed)
?PROGRESS NOTE ? ?Michael [name]  ION:[id number] DOB: [birth year] DOA: 06/11/2021 ?PCP: [name], PA  ? ?Brief Narrative:  ?The patient. Michael [name] is a 61 year old male with past medical history significant for essential hypertension, obesity who presented to [facility] ED on 4/12 with progressive back pain. Patient also reports persistent nausea/vomiting; and has been seen at the [facility], ED on multiple occasions for such complaint.  Patient recently hospitalized from 3/17 - 3/22 with findings of T8-9 discitis and E. coli bacteremia following recent dental infection.  Patient was discharged on Ancef to continue through 06/27/2021. ?  ?In the ED, WBC count 12.3, hemoglobin 16.2, platelets 309.  Sodium 133, potassium 3.2, chloride 98, CO2 23, glucose 178, BUN 12, creatinine 0.99.  Lipase 41, AST 51, ALT 109.  Total bilirubin 0.8.  CRP 10.2, lactic acid 2.4.  Urinalysis unrevealing.  UDS positive for opiates.  CT abdomen/pelvis with interval worsening of known discitis/hospital-itis at T8-T9 with increased bony erosions and fragmentation of the endplates with increased paravertebral inflammatory changes and soft tissue density, moderate spinal canal stenosis, patchy atelectasis versus infiltrate lung bases, cholelithiasis, stable right adrenal adenoma, degenerative changes lumbar spine with severe spinal canal stenosis L2-3, L4-5.  MR T-spine with substantial progression T8-9 discitis/osteomyelitis since 3/17, eroded endplates with 25% loss of T8 vertebral body height since that time, probable septic facet joints, increased paraspinal phlegmon and can fluid epidural and dural thickening, increased spinal stenosis T8-9 spinal cord mass effect but with no cord edema.   ? ?Seen by neurosurgery, recommend medical management, continue IV antibiotics.  Infectious disease consulted and recommending continuing IV Ceftriaxone until 06/27/21.  Hospital service consulted for further evaluation and management of thoracic  spine progressive discitis/osteomyelitis. ? ?Given his Nausea and Vomiting, GI was consulted and offered EGD but patient refused and recommending Scheduled Zofran and PPI BID. Will advance Diet to Soft Diet today and continue to monitor LFTs. LFTs silightly elevated and patient has not had a Bowel Movement since Saturday but refusing bowel regimen but was agreeable to taking the bisacodyl and subsequently agreed to a smog enema with some small results.  We will give him another smog enema and continue bowel regimen for now. ? ?PT OT evaluated and recommended CIR but patient did not demonstrate the ability for intensive rehab required to admit to CIR so now recommendations for him go to SNF. Patient given SNF choices and [facility] [place] [facility] patient clinicals and awaiting bed offer and [facility].    ? ?Assessment and Plan: ? ?T8-9 Discitis/Osteomyelitis ?Patient representing to the ED with progressive back pain.  Imaging findings significant for substantial progression of T8-9 discitis/osteomyelitis since 3/17, eroded endplates with 25% loss of T8 vertebral body height since that time, probable septic facet joints, increased paraspinal phlegmon and can fluid epidural and dural thickening, increased spinal stenosis T8-9 spinal cord mass effect but with no cord edema.  CRP elevated 10.2, lactic acid 2.5, WBC count 12.3.  Seen by neurosurgery, recommend medical management, continue IV antibiotics.  ?-WBC is now 6.4 x2 ?-Infectious disease following, appreciate assistance ?-Abscess Cx done and showed "No growth aerobically or anaerobically" ?-Blood cultures x2: No growth x5 days ?-S/p T8-9 disc space aspiration by IR 4/14, culture with no growth x3 days ?-C'/w Ketorolac 15 mg mg IV every 8 hours x5 days but will need to watch Renal Fxn carefully  ?-C/w Oxycodone 5 mg every 4 hours PRN moderate pain ?-Continue Dilaudid 0.5  mg IV every 3 hours as needed severe breakthrough  pain ?-C/w Ceftriaxone 2 g IV every 24 hours until 06/27/21 and per ID following completion of IV Abx they can add an additional 2 weeks of Cefadroxil and consider re-imaging at some point  ?-Patient has a follow up with Dr. Renold Don on 06/25/21  ?-PT/OT recommending CIR but not a candidate but will need to go to SNF ?  ?Nausea/Vomiting, intermittent  ?-Patient with episodic nausea/vomiting, seen in the ED several times. Vomited this AM  ?-CT abdomen/pelvis with no bowel obstruction, no increased stool burden with notable small gallstones.  Etiology likely secondary to esophageal candidiasis with known current oral thrush versus atypical presentation of gallstones. ?-Seen by gastroenterology, offered EGD but patient declined.  GI now on standby. ?-C/w Ondansetron 4 mg po/IV q8h and will add Compazine 10 mg IV q6hprn, Nausea, Vomiting, Refractory Nausea and Vomiting ?-C/w Protonix 40 mg IV q12h and getting po/IV Zofran 4 mg q8h ?-Check KUB this Afternoon ?-Diet advanced but patient is afraid of eating given his "queasiness"  ?-C/w Supportive care, antiemetics ?  ?Elevated LFTs, improving  ?-Etiology likely secondary to progressive infection from osteomyelitis versus medication effect with ceftriaxone.   ?-CK level below normal limits, hepatitis panel negative. ?-AST 51>>34>51>39 -> 47 -> 39 -> 35 -> 33 -> 30 -> 31 ?-ALT 109>>64>79>72 -> 84 -> 80 -> 73 -> 68 -> 64 -> 60 ?-Tbili Stable and normal at 0.7 x3 ?-Continue to trend LFTs and repeat CMP in the AM  ?  ?Essential Hypertension ?-Home regimen inclueds lisinopril-HCTZ 10-12.5 mg p.o. daily. ?-Was Currently Holding home antihypertensives currently due to borderline hypotension but now BP improving so may resume antihypertensives  ?-C/w Hydralazine 5 mg IV q4hprn HBP ?-Continue to monitor BP per Protocol  ?-Last BP reading was 121/88 ?  ?Oral thrush ?-C/w Nystatin 500,000 units BID but not taking it consistently so will place on Fluconazole with Pharmacy to Dose and is now  on Fluconazole 200 mg IV Daily  ?  ?Hyponatremia ?-Mild. Na+ ranging from 130-134 daily with it being 131 this AM ?-Continue to Monitor and Trend and repeat CMP in the AM  ?  ?Constipation ?-Patient has been refusing bowel regimen and had not had a bowel movement in about a week ?-On Bisacodyl 5 mg po Daily PRN Moderate Constipation and refused his Bisacodyl 10 mg RC x1 but maybe open to an ENEMA; Will order a SMOG ?-Continuing Miralax 17 gram po Daily PRN Mild Constipation ?-He was Refusing All other Laxatives and Bowel Regimen but now agreeable to Senna-Docusate 2 tab po BID  ?-I discussed with him about Enema or Suppository and he refuses these but maybe open to it so will order a SMOG; Got SMOG yesterday and had a BM with small results.  Currently he then had some bright red watery stool and denied any abdominal pain.  Work-up was done and his hemoglobin/hematocrit is relatively stable.  INR was 1.3.  Likely in the setting of hemorrhoids.  We will need to continue monitor and will give him another SMOG again today to assist with him having a BM ? ?Bright Red Blood Per Rectum ?-Hgb/Hct relatively stable  ?-Could be from Constipation and Hemorrhoids ?-Continue to Monitor and Trend and watch for S/Sx of bleeding ?-No overt bleeding noted ?-Follow CBC closely and GI had been consulted earlier this Admit but patient refused EGD and ? If he will be agreeable to Colonoscopy  ?  ?Dental Pain ?-DG orthopantogram with  multiple missing teeth with dental caries and periapical lucencies. ?-Will need Outpatient follow-up with dentist and he is currently getting Abx ?  ?Physical Deconditioning ?-PT/OT evaluation: Recommending SNF (Had recommended CIR but did not demonstrate the ability for intensive rehab) ?-[facility] given and [facility] and currently is SNF placement is under review and informed authorization is not initiated as yet ?  ?Hypoalbuminemia ?-Patient's Albumin Level this AM is 2.5  x2 ?-Continue to Monitor and Trend ?-Will consult Nutrition for further evaluation for Supplementation ?  ?Bilateral Vision Blurriness, stable ?-Patient states it has been going on for a weak and he does not wear glass

## 2021-06-24 DIAGNOSIS — E6609 Other obesity due to excess calories: Secondary | ICD-10-CM | POA: Diagnosis not present

## 2021-06-24 DIAGNOSIS — B370 Candidal stomatitis: Secondary | ICD-10-CM | POA: Diagnosis not present

## 2021-06-24 DIAGNOSIS — I10 Essential (primary) hypertension: Secondary | ICD-10-CM | POA: Diagnosis not present

## 2021-06-24 DIAGNOSIS — M4644 Discitis, unspecified, thoracic region: Secondary | ICD-10-CM | POA: Diagnosis not present

## 2021-06-24 LAB — COMPREHENSIVE METABOLIC PANEL
ALT: 63 U/L — ABNORMAL HIGH (ref 0–44)
AST: 29 U/L (ref 15–41)
Albumin: 2.6 g/dL — ABNORMAL LOW (ref 3.5–5.0)
Alkaline Phosphatase: 94 U/L (ref 38–126)
Anion gap: 9 (ref 5–15)
BUN: 12 mg/dL (ref 6–20)
CO2: 24 mmol/L (ref 22–32)
Calcium: 8.6 mg/dL — ABNORMAL LOW (ref 8.9–10.3)
Chloride: 97 mmol/L — ABNORMAL LOW (ref 98–111)
Creatinine, Ser: 0.84 mg/dL (ref 0.61–1.24)
GFR, Estimated: >60 mL/min (ref >60)
Glucose, Bld: 111 mg/dL — ABNORMAL HIGH (ref 70–99)
Potassium: 3.7 mmol/L (ref 3.5–5.1)
Sodium: 130 mmol/L — ABNORMAL LOW (ref 135–145)
Total Bilirubin: 0.5 mg/dL (ref 0.3–1.2)
Total Protein: 6.8 g/dL (ref 6.5–8.1)

## 2021-06-24 LAB — CBC WITH DIFFERENTIAL/PLATELET
Abs Immature Granulocytes: 0.08 10*3/uL — ABNORMAL HIGH (ref 0.00–0.07)
Basophils Absolute: 0.1 10*3/uL (ref 0.0–0.1)
Basophils Relative: 1 %
Eosinophils Absolute: 0.2 10*3/uL (ref 0.0–0.5)
Eosinophils Relative: 3 %
HCT: 40.2 % (ref 39.0–52.0)
Hemoglobin: 13.7 g/dL (ref 13.0–17.0)
Immature Granulocytes: 1 %
Lymphocytes Relative: 29 %
Lymphs Abs: 2.1 10*3/uL (ref 0.7–4.0)
MCH: 29.6 pg (ref 26.0–34.0)
MCHC: 34.1 g/dL (ref 30.0–36.0)
MCV: 86.8 fL (ref 80.0–100.0)
Monocytes Absolute: 1 10*3/uL (ref 0.1–1.0)
Monocytes Relative: 15 %
Neutro Abs: 3.7 10*3/uL (ref 1.7–7.7)
Neutrophils Relative %: 51 %
Platelets: 209 10*3/uL (ref 150–400)
RBC: 4.63 MIL/uL (ref 4.22–5.81)
RDW: 14.6 % (ref 11.5–15.5)
WBC: 7.2 10*3/uL (ref 4.0–10.5)
nRBC: 0 % (ref 0.0–0.2)

## 2021-06-24 LAB — PHOSPHORUS: Phosphorus: 2.8 mg/dL (ref 2.5–4.6)

## 2021-06-24 LAB — MAGNESIUM: Magnesium: 2.2 mg/dL (ref 1.7–2.4)

## 2021-06-24 MED ORDER — CEFADROXIL 500 MG PO CAPS
1000.0000 mg | ORAL_CAPSULE | Freq: Two times a day (BID) | ORAL | Status: DC
  Administered 2021-06-28 – 2021-07-02 (×9): 1000 mg via ORAL
  Filled 2021-06-24 (×10): qty 2

## 2021-06-24 NOTE — Progress Notes (Signed)
?PROGRESS NOTE ? ? ? ?Michael PlumberJohn R Wentz  ZOX:[id number] DOB: 12-07-1960 DOA: 06/11/2021 ?PCP: Lovey NewcomerBoyd, William S, PA  ? ?Brief Narrative:  ?The patient. Michael [name] is a 61 year old male with past medical history significant for essential hypertension, obesity who [facility] ED on 4/12 with progressive back pain. Patient also reports persistent nausea/vomiting; and has [facility] on multiple occasions for such complaint.  Patient recently hospitalized from 3/17 - 3/22 with findings of T8-9 discitis and E. coli bacteremia following recent dental infection.  Patient was discharged on Ancef to continue through 06/27/2021. ?  ?In the ED, WBC count 12.3, hemoglobin 16.2, platelets 309.  Sodium 133, potassium 3.2, chloride 98, CO2 23, glucose 178, BUN 12, creatinine 0.99.  Lipase 41, AST 51, ALT 109.  Total bilirubin 0.8.  CRP 10.2, lactic acid 2.4.  Urinalysis unrevealing.  UDS positive for opiates.  CT abdomen/pelvis with interval worsening of known discitis/hospital-itis at T8-T9 with increased bony erosions and fragmentation of the endplates with increased paravertebral inflammatory changes and soft tissue density, moderate spinal canal stenosis, patchy atelectasis versus infiltrate lung bases, cholelithiasis, stable right adrenal adenoma, degenerative changes lumbar spine with severe spinal canal stenosis L2-3, L4-5.  MR T-spine with substantial progression T8-9 discitis/osteomyelitis since 3/17, eroded endplates with 25% loss of T8 vertebral body height since that time, probable septic facet joints, increased paraspinal phlegmon and can fluid epidural and dural thickening, increased spinal stenosis T8-9 spinal cord mass effect but with no cord edema.   ? ?Seen by neurosurgery, recommend medical management, continue IV antibiotics.  Infectious disease consulted and recommending continuing IV Ceftriaxone until 06/27/21.  Hospital service consulted for further evaluation and management of thoracic  spine progressive discitis/osteomyelitis. ? ?Given his Nausea and Vomiting, GI was consulted and offered EGD but patient refused and recommending Scheduled Zofran and PPI BID. Will advance Diet to Soft Diet today and continue to monitor LFTs. LFTs silightly elevated and patient has not had a Bowel Movement since Saturday but refusing bowel regimen but was agreeable to taking the bisacodyl and subsequently agreed to a smog enema with some small results.  We will give him another smog enema and continue bowel regimen for now but he is continually is refusing his bowel regimen and refused second SMOG yesterday. ? ?PT OT evaluated and recommended CIR but patient did not demonstrate the ability for intensive rehab required to admit to CIR so now recommendations for him go to SNF. Patient given SNF choices and [place] [facility] [place] [facility] [place] [facility] [place] patient clinicals and awaiting bed offer and Firefigh[employer]surance Auth.    ? ?Assessment and Plan: ? ?T8-9 Discitis/Osteomyelitis ?Patient representing to the ED with progressive back pain.  Imaging findings significant for substantial progression of T8-9 discitis/osteomyelitis since 3/17, eroded endplates with 25% loss of T8 vertebral body height since that time, probable septic facet joints, increased paraspinal phlegmon and can fluid epidural and dural thickening, increased spinal stenosis T8-9 spinal cord mass effect but with no cord edema.  CRP elevated 10.2, lactic acid 2.5, WBC count 12.3.  Seen by neurosurgery, recommend medical management, continue IV antibiotics.  ?-WBC is now 6.4 x2 -> 7.2 ?-Infectious disease following, appreciate assistance ?-Abscess Cx done and showed "No growth aerobically or anaerobically" ?-Blood cultures x2: No growth x5 days ?-S/p T8-9 disc space aspiration by IR 4/14, culture with no growth x3 days ?-C'/w Ketorolac 15 mg mg IV every 8 hours x5 days but will need to  watch Renal Fxn carefully  ?-C/w Oxycodone 5 mg every 4 hours PRN  moderate pain ?-Continue Dilaudid 0.5 mg IV every 3 hours as needed severe breakthrough pain ?-C/w Ceftriaxone 2 g IV every 24 hours until 06/27/21 and per ID following completion of IV Abx they can add an additional 2 weeks of Cefadroxil and consider re-imaging at some point  ?-Patient has a follow up with Dr. Renold Don on 06/25/21  ?-PT/OT recommending CIR but not a candidate but will need to go to SNF ?  ?Nausea/Vomiting, intermittent  ?-Patient with episodic nausea/vomiting, seen in the ED several times. Vomited this AM  ?-CT abdomen/pelvis with no bowel obstruction, no increased stool burden with notable small gallstones.  Etiology likely secondary to esophageal candidiasis with known current oral thrush versus atypical presentation of gallstones. ?-Seen by gastroenterology, offered EGD but patient declined.  GI now on standby. ?-C/w Ondansetron 4 mg po/IV q8h and will add Compazine 10 mg IV q6hprn, Nausea, Vomiting, Refractory Nausea and Vomiting ?-C/w Protonix 40 mg IV q12h and getting po/IV Zofran 4 mg q8h ?-Checked KUB yesterday and was showing "There is a non obstructive bowel gas pattern. No supine evidence of free air. No organomegaly or suspicious calcification. No acute bony abnormality. Mild scoliosis and degenerative changes in the lumbar spine." ?-Diet advanced but patient is afraid of eating given his "queasiness"  ?-C/w Supportive care, antiemetics ?  ?Elevated LFTs, improving  ?-Etiology likely secondary to progressive infection from osteomyelitis versus medication effect with ceftriaxone.   ?-CK level below normal limits, hepatitis panel negative. ?-AST 51>>34>51>39 -> 47 -> 39 -> 35 -> 33 -> 30 -> 31 -> 29 ?-ALT 109>>64>79>72 -> 84 -> 80 -> 73 -> 68 -> 64 -> 60 -> 63 ?-Tbili Stable and normal at 0.7 x3 ?-Continue to trend LFTs and repeat CMP in the AM  ?  ?Essential Hypertension ?-Home regimen inclueds lisinopril-HCTZ 10-12.5 mg p.o. daily. ?-Was Currently Holding home antihypertensives currently due to  borderline hypotension but now BP improving so may resume antihypertensives  ?-C/w Hydralazine 5 mg IV q4hprn HBP ?-Continue to monitor BP per Protocol  ?-Last BP reading was 126/101 ?  ?Oral Thrush ?-C/w Nystatin 500,000 units BID but not taking it consistently so will place on Fluconazole with Pharmacy to Dose and is now on Fluconazole 200 mg IV Daily  ?  ?Hyponatremia ?-Mild. Na+ ranging from 130-134 daily with it being 130 this AM ?-Continue to Monitor and Trend and repeat CMP in the AM  ?  ?Constipation ?-Patient has been refusing bowel regimen and had not had a bowel movement in about a week ?-On Bisacodyl 5 mg po Daily PRN Moderate Constipation and refused his Bisacodyl 10 mg RC x1 but maybe open to an ENEMA; Will order a SMOG ?-Continuing Miralax 17 gram po Daily PRN Mild Constipation ?-He was Refusing All other Laxatives and Bowel Regimen but now agreeable to Senna-Docusate 2 tab po BID  ?-I discussed with him about Enema or Suppository and he refuses these but maybe open to it so will order a SMOG; Got SMOG yesterday and had a BM with small results.  Currently he then had some bright red watery stool and denied any abdominal pain.  Work-up was done and his hemoglobin/hematocrit is relatively stable.  INR was 1.3.  Likely in the setting of hemorrhoids.  We will need to continue monitor and will give him another SMOG again today to assist with him having a BM ?  ?Bright Red Blood Per Rectum ?-  Hgb/Hct relatively stable and went from 13.5/40.3 -> 13.6/40.7 -> 14.0/41.3 -> 13.7/40.2 ?-Could be from Constipation and Hemorrhoids ?-Continue to Monitor and Trend and watch for S/Sx of bleeding ?-No overt bleeding noted ?-Follow CBC closely and GI had been consulted earlier this Admit but patient refused EGD and ? If he will be agreeable to Colonoscopy  ?  ?Dental Pain ?-DG orthopantogram with multiple missing teeth with dental caries and periapical lucencies. ?-Will need Outpatient follow-up with dentist and he is  currently getting Abx ?  ?Physical Deconditioning ?-PT/OT evaluation: Recommending SNF (Had recommended CIR but did not demonstrate the ability for intensive rehab) ?-SNF options given and patient p

## 2021-06-24 NOTE — TOC Progression Note (Addendum)
Transition of Care (TOC) - Progression Note  ? ? ?Patient Details  ?Name: Michael [name] ?MRN: [id number] ?Date of Birth: 02/17/1961 ? ?Transition of Care (TOC) CM/SW Contact  ?Baldemar Lenis, LCSW ?Phone Number: ?06/24/2021, 11:48 AM ? ?Clinical Narrative:   CSW attempting to reach Admissions at [facility] to inquire about insurance authorization, called multiple times and left messages, awaiting call back. ? ?UPDATE 1:53 PM: CSW spoke with Admissions at [facility] and Berkley Harvey is still pending. CSW sent updated PT note from yesterday for them to submit. Hopeful for auth tomorrow. CSW to follow. ? ? ?Expected Discharge Plan: Skilled Nursing Facility ?Barriers to Discharge: Insurance Authorization ? ?Expected Discharge Plan and Services ?Expected Discharge Plan: Skilled Nursing Facility ?  ?Discharge Planning Services: CM Consult ?  ?Living arrangements for the past 2 months: Single Family Home ?                ?  ?  ?  ?  ?  ?HH Arranged: RN ?HH Agency:  Scientist, physiological) ?  ?  ?  ? ? ?Social Determinants of Health (SDOH) Interventions ?  ? ?Readmission Risk Interventions ?   ? View : No data to display.  ?  ?  ?  ? ? ?

## 2021-06-24 NOTE — Progress Notes (Signed)
PT Cancellation Note ? ?Patient Details ?Name: Michael [name] ?MRN: [id number] ?DOB: 07/15/1960 ? ? ?Cancelled Treatment:    Reason Eval/Treat Not Completed: Patient declined, no reason specified.  Pt refused x2.  Schedule given for pt to be prepared with meds, but still refused after pain meds given. ?06/24/2021 ? ?Jacinto Halim., PT ?Acute Rehabilitation Services ?[phone]  (pager) ?[phone]  (office) ? ? ?[name] ?06/24/2021, 4:55 PM ?

## 2021-06-24 NOTE — Progress Notes (Signed)
OT Cancellation Note ? ?Patient Details ?Name: Michael [name] ?MRN: [id number] ?DOB: May 31, 1960 ? ? ?Cancelled Treatment:    Reason Eval/Treat Not Completed: Other (comment) (Pt reports nausea and just vomiting earlier.)  Will re-attempt later today if time permits, or see tomorrow.  ? ?[name] OTR/L ?06/24/2021, 1:53 PM ?

## 2021-06-25 ENCOUNTER — Inpatient Hospital Stay: Payer: BC Managed Care – PPO | Admitting: Internal Medicine

## 2021-06-25 DIAGNOSIS — R112 Nausea with vomiting, unspecified: Secondary | ICD-10-CM

## 2021-06-25 DIAGNOSIS — M869 Osteomyelitis, unspecified: Secondary | ICD-10-CM

## 2021-06-25 DIAGNOSIS — I10 Essential (primary) hypertension: Secondary | ICD-10-CM | POA: Diagnosis not present

## 2021-06-25 DIAGNOSIS — E6609 Other obesity due to excess calories: Secondary | ICD-10-CM | POA: Diagnosis not present

## 2021-06-25 DIAGNOSIS — M4644 Discitis, unspecified, thoracic region: Secondary | ICD-10-CM | POA: Diagnosis not present

## 2021-06-25 DIAGNOSIS — K5900 Constipation, unspecified: Secondary | ICD-10-CM

## 2021-06-25 LAB — CBC WITH DIFFERENTIAL/PLATELET
Abs Immature Granulocytes: 0.06 10*3/uL (ref 0.00–0.07)
Basophils Absolute: 0 10*3/uL (ref 0.0–0.1)
Basophils Relative: 1 %
Eosinophils Absolute: 0.2 10*3/uL (ref 0.0–0.5)
Eosinophils Relative: 3 %
HCT: 41.6 % (ref 39.0–52.0)
Hemoglobin: 13.6 g/dL (ref 13.0–17.0)
Immature Granulocytes: 1 %
Lymphocytes Relative: 34 %
Lymphs Abs: 2 10*3/uL (ref 0.7–4.0)
MCH: 29 pg (ref 26.0–34.0)
MCHC: 32.7 g/dL (ref 30.0–36.0)
MCV: 88.7 fL (ref 80.0–100.0)
Monocytes Absolute: 0.9 10*3/uL (ref 0.1–1.0)
Monocytes Relative: 16 %
Neutro Abs: 2.6 10*3/uL (ref 1.7–7.7)
Neutrophils Relative %: 45 %
Platelets: 224 10*3/uL (ref 150–400)
RBC: 4.69 MIL/uL (ref 4.22–5.81)
RDW: 15 % (ref 11.5–15.5)
WBC: 5.8 10*3/uL (ref 4.0–10.5)
nRBC: 0 % (ref 0.0–0.2)

## 2021-06-25 LAB — COMPREHENSIVE METABOLIC PANEL
ALT: 74 U/L — ABNORMAL HIGH (ref 0–44)
AST: 39 U/L (ref 15–41)
Albumin: 2.5 g/dL — ABNORMAL LOW (ref 3.5–5.0)
Alkaline Phosphatase: 100 U/L (ref 38–126)
Anion gap: 6 (ref 5–15)
BUN: 13 mg/dL (ref 6–20)
CO2: 26 mmol/L (ref 22–32)
Calcium: 8.7 mg/dL — ABNORMAL LOW (ref 8.9–10.3)
Chloride: 102 mmol/L (ref 98–111)
Creatinine, Ser: 0.93 mg/dL (ref 0.61–1.24)
GFR, Estimated: >60 mL/min (ref >60)
Glucose, Bld: 103 mg/dL — ABNORMAL HIGH (ref 70–99)
Potassium: 3.7 mmol/L (ref 3.5–5.1)
Sodium: 134 mmol/L — ABNORMAL LOW (ref 135–145)
Total Bilirubin: 0.5 mg/dL (ref 0.3–1.2)
Total Protein: 6.9 g/dL (ref 6.5–8.1)

## 2021-06-25 LAB — MAGNESIUM: Magnesium: 2.2 mg/dL (ref 1.7–2.4)

## 2021-06-25 LAB — PHOSPHORUS: Phosphorus: 2.9 mg/dL (ref 2.5–4.6)

## 2021-06-25 MED ORDER — ENSURE ENLIVE PO LIQD
237.0000 mL | Freq: Two times a day (BID) | ORAL | Status: DC
Start: 2021-06-26 — End: 2021-07-02
  Administered 2021-06-26 – 2021-07-01 (×10): 237 mL via ORAL

## 2021-06-25 NOTE — Progress Notes (Signed)
Occupational Therapy Treatment ?Patient Details ?Name: Michael [name] ?MRN: [id number] ?DOB: [birth year] ?Today's Date: 06/25/2021 ? ? ?History of present illness Michael [name] is a 61 y.o. male who presented to ED c/o back pain x 2 weeks. Pt was admitted for T8-T9 osteomyelitis. PMH: HTN, obesity, GERD ?  ?OT comments ? Pt making limited progress due to self limiting behaviors. Pt continually reporting that he is too weak to move or his pain is excruciating. OT and RN provided extensive education to patient on importance of mobility and the fact that pt can complete these tasks. Pt appears very down and discouraged each session. Completed seated exercises once EOB this session, including ankle pumps, kickouts, and attempted marching with max assist. Continuing to recommend SNF to maximize independence and safety. OT will follow acutely.   ? ?Recommendations for follow up therapy are one component of a multi-disciplinary discharge planning process, led by the attending physician.  Recommendations may be updated based on patient status, additional functional criteria and insurance authorization. ?   ?Follow Up Recommendations ? Skilled nursing-short term rehab (<3 hours/day)  ?  ?Assistance Recommended at Discharge Frequent or constant Supervision/Assistance  ?Patient can return home with the following ? A lot of help with walking and/or transfers;A lot of help with bathing/dressing/bathroom;Assistance with cooking/housework;Assist for transportation;Help with stairs or ramp for entrance ?  ?Equipment Recommendations ? BSC/3in1  ?  ?Recommendations for Other Services   ? ?  ?Precautions / Restrictions Precautions ?Precautions: Fall ?Precaution Comments: morbid obesity, extremely self limiting ?Restrictions ?Weight Bearing Restrictions: No ?Other Position/Activity Restrictions: self limiting due to back pain  ? ? ?  ? ?Mobility Bed Mobility ?Overal bed mobility: Needs Assistance ?Bed Mobility: Rolling, Sidelying to  Sit, Sit to Sidelying ?Rolling: Supervision ?Sidelying to sit: Min guard ?  ?  ?Sit to sidelying: Mod assist ?General bed mobility comments: Pt completed limited roll toL side, reporting that his stomach hurt too much. min guard to push to sitting, mod A to bring BLE into bed ?  ? ?Transfers ?  ?  ?  ?  ?  ?  ?  ?  ?  ?General transfer comment: attempted to scoot, reported that was too painful on his stomach, refused to stand, reporting that he would not be ale tostand it. ?  ?  ?Balance Overall balance assessment: Mild deficits observed, not formally tested ?  ?  ?  ?  ?  ?  ?  ?  ?  ?  ?  ?  ?  ?  ?  ?  ?  ?  ?   ? ?ADL either performed or assessed with clinical judgement  ? ?ADL Overall ADL's : Needs assistance/impaired ?  ?  ?  ?  ?  ?  ?  ?  ?  ?  ?  ?  ?  ?  ?  ?  ?  ?  ?  ?General ADL Comments: Limited due to patient particiapation ?  ? ?Extremity/Trunk Assessment   ?  ?  ?  ?  ?  ? ?Vision   ?  ?  ?Perception   ?  ?Praxis   ?  ? ?Cognition Arousal/Alertness: Awake/alert ?Behavior During Therapy: Flat affect ?Overall Cognitive Status: Within Functional Limits for tasks assessed ?  ?  ?  ?  ?  ?  ?  ?  ?  ?  ?  ?  ?  ?  ?  ?  ?General Comments: pt appears to  be in depressed spirits, with severe self limiting behaviors requiring max encouragement to participate in therapy. Pt able to mobilize with supervision however convinces self he is too "weak" ?  ?  ?   ?Exercises   ? ?  ?Shoulder Instructions   ? ? ?  ?General Comments VSS on RA, RN assisted withmotivating pt to participate  ? ? ?Pertinent Vitals/ Pain       Pain Assessment ?Pain Assessment: 0-10 ?Faces Pain Scale: Hurts little more ?Pain Location: back pain ?Pain Descriptors / Indicators: Discomfort, Grimacing, Sore ?Pain Intervention(s): Monitored during session ? ?Home Living   ?  ?  ?  ?  ?  ?  ?  ?  ?  ?  ?  ?  ?  ?  ?  ?  ?  ?  ? ?  ?Prior Functioning/Environment    ?  ?  ?  ?   ? ?Frequency ? Min 2X/week  ? ? ? ? ?  ?Progress Toward Goals ? ?OT  Goals(current goals can now be found in the care plan section) ? Progress towards OT goals: Not progressing toward goals - comment ? 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 ?OT Goal Formulation: With patient ?Time For Goal Achievement: 07/02/21 ?Potential to Achieve Goals: Fair ?ADL Goals ?Pt Will Perform Grooming: with modified independence;standing ?Pt Will Perform Lower Body Bathing: with modified independence;sitting/lateral leans;sit to/from stand;with adaptive equipment ?Pt Will Perform Lower Body Dressing: with modified independence;with adaptive equipment;sitting/lateral leans;sit to/from stand ?Pt Will Transfer to Toilet: with modified independence;stand pivot transfer ?Pt Will Perform Toileting - Clothing Manipulation and hygiene: with modified independence;sitting/lateral leans;sit to/from stand  ?Plan Discharge plan remains appropriate;Frequency remains appropriate   ? ?Co-evaluation ? ? ?   ?  ?  ?  ?  ? ?  ?AM-PAC OT "6 Clicks" Daily Activity     ?Outcome Measure ? ? Help from another person eating meals?: A Little ?Help from another person taking care of personal grooming?: A Little ?Help from another person toileting, which includes using toliet, bedpan, or urinal?: A Lot ?Help from another person bathing (including washing, rinsing, drying)?: A Lot ?Help from another person to put on and taking off regular upper body clothing?: A Little ?Help from another person to put on and taking off regular lower body clothing?: A Lot ?6 Click Score: 15 ? ?  ?End of Session   ? ?OT Visit Diagnosis: Unsteadiness on feet (R26.81);Other abnormalities of gait and mobility (R26.89);Muscle weakness (generalized) (M62.81) ?  ?Activity Tolerance Other (comment) (Pt self limiting) ?  ?Patient Left in bed;with call bell/phone within reach;with bed alarm set ?  ?Nurse Communication Mobility status ?  ? ?   ? ?Time: 3329-5188 ?OT Time Calculation (min): 29 min ? ?Charges: OT  General Charges ?$OT Visit: 1 Visit ?OT Treatments ?$Therapeutic Activity: 23-37 mins ? ?Kahner Yanik H., OTR/L ?Acute Rehabilitation ? ?Cyntia Staley Elane Bing Plume ?06/25/2021, 6:11 PM ?

## 2021-06-25 NOTE — Progress Notes (Addendum)
Nutrition Follow-up ? ?DOCUMENTATION CODES:  ?Obesity unspecified ? ?INTERVENTION:  ?Continue current diet as ordered, encourage PO intake. Add ordering assistance ?Discontinue prosource, pt does not like ?Add Ensure Enlive po BID back in as pt is now agreeable, each supplement provides 350 kcal and 20 grams of protein. ?Request new measured weight ? ?NUTRITION DIAGNOSIS:  ?Inadequate oral intake related to vomiting, acute illness, mouth pain, poor appetite as evidenced by per patient/family report. ?- remains applicable ? ?GOAL:  ?Patient will meet greater than or equal to 90% of their needs ?- progressing, diet and supplements in place ? ?MONITOR:  ?PO intake, Supplement acceptance, Diet advancement, Labs, Weight trends ? ?REASON FOR ASSESSMENT:  ?Consult ?Other (Comment) (nutritional goals) ? ?ASSESSMENT:  ?Pt admitted from home with back pain secondary to thoracic discitis. PMH significant for HTN and obesity.  Prior hospitalization from 3/17-3/22 with T8-9 discitis and E coli bacteremia following recent dental infection. ? ?Pt resting in bed at the time of assessment. Discussed intake since last assessment. States he has been eating some, recorded intake is poor. Pt reports that he does not always receive foods he likes and sometimes doesn't have an appetite. Will ask dining services to assist with ordering.  ? ?Discussed nutrition supplements with pt. Reports that he does not like the prosource. Pt is agreeable now to taking Ensure enlive. Will add in for pt. Prefers vanilla or strawberry.   ? ?Pt also agitated about his hospital bracelet being too tight. Discussed with RN who will obtain new one for pt.   ? ?Average Meal Intake: ?4/14-4/24: 29% intake x 6 recorded meals ? ?Nutritionally Relevant Medications: ?Scheduled Meds: ? PROSource Plus  30 mL Oral BID BM  ? ondansetron  4 mg Oral Q8H  ? pantoprazole (PROTONIX) IV  40 mg Intravenous Q12H  ? senna-docusate  2 tablet Oral BID  ? sorbitol, milk of mag,  mineral oil, glycerin (SMOG) enema  960 mL Rectal Once  ? ?Continuous Infusions: ? cefTRIAXone (ROCEPHIN)  IV 2 g (06/25/21 1022)  ? fluconazole (DIFLUCAN) IV 200 mg (06/24/21 1057)  ? ?PRN Meds: bisacodyl, diphenhydrAMINE, polyethylene glycol, prochlorperazine ? ?Labs Reviewed: ?Sodium 134 ? ?NUTRITION - FOCUSED PHYSICAL EXAM: ?Flowsheet Row Most Recent Value  ?Orbital Region Mild depletion  ?Upper Arm Region No depletion  ?Thoracic and Lumbar Region No depletion  ?Buccal Region Mild depletion  ?Temple Region No depletion  ?Clavicle Bone Region No depletion  ?Clavicle and Acromion Bone Region No depletion  ?Scapular Bone Region No depletion  ?Dorsal Hand No depletion  ?Patellar Region No depletion  ?Anterior Thigh Region No depletion  ?Posterior Calf Region No depletion  ?Edema (RD Assessment) Mild  [generalized]  ?Hair Reviewed  ?Eyes Reviewed  ?Mouth Reviewed  ?Skin Reviewed  ?Nails Reviewed  ? ?Diet Order:   ?Diet Order   ? ?       ?  Diet regular Room service appropriate? Yes; Fluid consistency: Thin  Diet effective now       ?  ? ?  ?  ? ?  ? ? ?EDUCATION NEEDS:  ?Education needs have been addressed ? ?Skin:  Skin Assessment: Reviewed RN Assessment ? ?Last BM:  4/24 ? ?Height:  ?Ht Readings from Last 1 Encounters:  ?06/11/21 5\' 11"  (1.803 m)  ? ? ?Weight:  ?Wt Readings from Last 1 Encounters:  ?06/11/21 113.4 kg  ? ? ?Ideal Body Weight:  78.2 kg ? ?BMI:  Body mass index is 34.87 kg/m?. ? ?Estimated Nutritional Needs:  ?Kcal:  2100-2300 kcal/d ?Protein:  110-120 g/d ?Fluid:  >/=2L ? ? ?Greig Castilla, RD, LDN ?Clinical Dietitian ?RD pager # available in [facility]  ?After hours/weekend pager # available in [facility] ?

## 2021-06-25 NOTE — Progress Notes (Signed)
Physical Therapy Treatment ?Patient Details ?Name: Michael [name] ?MRN: [id number] ?DOB: 07/08/1960 ?Today's Date: 06/25/2021 ? ? ?History of Present Illness Michael [name] is a 61 y.o. male who presented to ED c/o back pain x 2 weeks. Pt was admitted for T8-T9 osteomyelitis. PMH: HTN, obesity, GERD ? ?  ?PT Comments  ? ? Pt continues with depressed spirits and self limiting behavior. After max encouragement and informing pt that he can physically move despite pt's report "you just dont know how weak I am" pt able to transfer to EOB, std up to walker and transfer to [facility] with minA. Pt demonstrates capability to ambulate however limits himself. Left message with MD regarding pt's depressed spirits and lack of motivation to mobilize and improve health. Acute PT to cont to follow. ?   ?Recommendations for follow up therapy are one component of a multi-disciplinary discharge planning process, led by the attending physician.  Recommendations may be updated based on patient status, additional functional criteria and insurance authorization. ? ?Follow Up Recommendations ? Skilled nursing-short term rehab (<3 hours/day) ?  ?  ?Assistance Recommended at Discharge Frequent or constant Supervision/Assistance  ?Patient can return home with the following A little help with walking and/or transfers;A lot of help with bathing/dressing/bathroom;Assistance with cooking/housework;Assist for transportation;Help with stairs or ramp for entrance;Other (comment) ?  ?Equipment Recommendations ? None recommended by PT  ?  ?Recommendations for Other Services   ? ? ?  ?Precautions / Restrictions Precautions ?Precautions: Fall ?Precaution Comments: morbid obesity, extremely self limiting ?Restrictions ?Weight Bearing Restrictions: No ?Other Position/Activity Restrictions: self limiting due to back pain  ?  ? ?Mobility ? Bed Mobility ?Overal bed mobility: Needs Assistance ?Bed Mobility: Rolling, Sidelying to Sit, Sit to Sidelying ?Rolling:  Supervision ?Sidelying to sit: Min guard ?  ?  ?  ?General bed mobility comments: educated on log roll technique, pt insisted on "just pull me up" regarding PT pulling pt up to EOB, pt able to bring self to EOB without assist, PT as a mild anchor for pt's piece of mind ?  ? ?Transfers ?Overall transfer level: Needs assistance ?Equipment used: Rolling walker (2 wheels) ?Transfers: Sit to/from Stand, Bed to chair/wheelchair/BSC ?Sit to Stand: Min assist ?  ?Step pivot transfers: Min assist ?  ?  ?  ?General transfer comment: despite many excuses pt completed step pvt to [facility] with minA and use of RW ?  ? ?Ambulation/Gait ?  ?  ?  ?  ?  ?  ?  ?  ? ? ?Stairs ?  ?  ?  ?  ?  ? ? ?Wheelchair Mobility ?  ? ?Modified Rankin (Stroke Patients Only) ?  ? ? ?  ?Balance Overall balance assessment: Mild deficits observed, not formally tested ?  ?  ?  ?  ?  ?  ?  ?  ?  ?  ?  ?  ?  ?  ?  ?  ?  ?  ?  ? ?  ?Cognition Arousal/Alertness: Awake/alert ?Behavior During Therapy: Flat affect ?Overall Cognitive Status: Within Functional Limits for tasks assessed ?  ?  ?  ?  ?  ?  ?  ?  ?  ?  ?  ?  ?  ?  ?  ?  ?General Comments: pt appears to be in depressed spirits, with severe self limiting behaviors requiring max encouragement to participate in therapy. Pt able to mobilize with supervision however convinces self he is too "weak" ?  ?  ? ?  ?  Exercises   ? ?  ?General Comments General comments (skin integrity, edema, etc.): pt with lots of lines from linens on back indicating lack of movement, back was washed by PT ?  ?  ? ?Pertinent Vitals/Pain Pain Assessment ?Pain Assessment: 0-10 ?Faces Pain Scale: Hurts little more ?Pain Location: back pain ?Pain Descriptors / Indicators: Discomfort, Grimacing, Sore  ? ? ?Home Living   ?  ?  ?  ?  ?  ?  ?  ?  ?  ?   ?  ?Prior Function    ?  ?  ?   ? ?PT Goals (current goals can now be found in the care plan section) Acute Rehab PT Goals ?PT Goal Formulation: With patient ?Time For Goal Achievement:  07/08/21 ?Potential to Achieve Goals: Good ?Progress towards PT goals: Progressing toward goals ? ?  ?Frequency ? ? ? Min 3X/week ? ? ? ?  ?PT Plan Current plan remains appropriate  ? ? ?Co-evaluation   ?  ?  ?  ?  ? ?  ?AM-PAC PT "6 Clicks" Mobility   ?Outcome Measure ? Help needed turning from your back to your side while in a flat bed without using bedrails?: A Little ?Help needed moving from lying on your back to sitting on the side of a flat bed without using bedrails?: A Little ?Help needed moving to and from a bed to a chair (including a wheelchair)?: A Little ?Help needed standing up from a chair using your arms (e.g., wheelchair or bedside chair)?: A Little ?Help needed to walk in hospital room?: A Little ?Help needed climbing 3-5 steps with a railing? : A Lot ?6 Click Score: 17 ? ?  ?End of Session Equipment Utilized During Treatment: Gait belt ?Activity Tolerance: Patient tolerated treatment well;Patient limited by fatigue ?Patient left: with call bell/phone within reach (on [facility], RN and tech aware) ?Nurse Communication: Mobility status ?PT Visit Diagnosis: Unsteadiness on feet (R26.81);Repeated falls (R29.6);Difficulty in walking, not elsewhere classified (R26.2) ?  ? ? ?Time: [id number] ?PT Time Calculation (min) (ACUTE ONLY): 25 min ? ?Charges:  $Therapeutic Activity: 23-37 mins          ?          ? ?Kittie Plater, PT, DPT ?Acute Rehabilitation Services ?Secure chat preferred ?Office #: [phone] ? ? ? ?Quetzal Meany M Orion Vandervort ?06/25/2021, 1:42 PM ? ?

## 2021-06-25 NOTE — Plan of Care (Signed)
  Problem: Health Behavior/Discharge Planning: Goal: Ability to manage health-related needs will improve Outcome: Progressing   Problem: Clinical Measurements: Goal: Ability to maintain clinical measurements within normal limits will improve Outcome: Progressing   Problem: Clinical Measurements: Goal: Will remain free from infection Outcome: Progressing   

## 2021-06-25 NOTE — TOC Progression Note (Signed)
Transition of Care (TOC) - Progression Note  ? ? ?Patient Details  ?Name: Michael [name] ?MRN: [id number] ?Date of Birth: [birth year] ? ?Transition of Care (TOC) CM/SW Contact  ?Baldemar Lenis, LCSW ?Phone Number: ?06/25/2021, 3:15 PM ? ?Clinical Narrative:   CSW following for discharge to SNF, awaiting insurance authorization. CSW attempted to contact [facility], no answer, left a message. CSW to follow. ? ? ? ?Expected Discharge Plan: Skilled Nursing Facility ?Barriers to Discharge: Insurance Authorization ? ?Expected Discharge Plan and Services ?Expected Discharge Plan: Skilled Nursing Facility ?  ?Discharge Planning Services: CM Consult ?  ?Living arrangements for the past 2 months: Single Family Home ?                ?  ?  ?  ?  ?  ?HH Arranged: RN ?HH Agency:  Scientist, physiological) ?  ?  ?  ? ? ?Social Determinants of Health (SDOH) Interventions ?  ? ?Readmission Risk Interventions ?   ? View : No data to display.  ?  ?  ?  ? ? ?

## 2021-06-25 NOTE — Progress Notes (Signed)
?PROGRESS NOTE ? ? ? ?Michael [name]  KVQ:[id number] DOB: 1960-11-26 DOA: 06/11/2021 ?PCP: Michael Newcomer, PA  ? ? ?Chief Complaint  ?Patient presents with  ? Back Pain  ? ? ?Brief Narrative:  ?Michael [name] is a 61 year old male with past medical history significant for essential hypertension, obesity who presented to [facility] ED on 4/12 with progressive back pain. Patient also reports persistent nausea/vomiting; and has been seen at the [facility] ED on multiple occasions for such complaint.  Patient recently hospitalized from 3/17 - 3/22 with findings of T8-9 discitis and E. coli bacteremia following recent dental infection.  Patient was discharged on Ancef to continue through 06/27/2021. ?  ?In the ED, WBC count 12.3, hemoglobin 16.2, platelets 309.  Sodium 133, potassium 3.2, chloride 98, CO2 23, glucose 178, BUN 12, creatinine 0.99.  Lipase 41, AST 51, ALT 109.  Total bilirubin 0.8.  CRP 10.2, lactic acid 2.4.  Urinalysis unrevealing.  UDS positive for opiates.  CT abdomen/pelvis with interval worsening of known discitis/hospital-itis at T8-T9 with increased bony erosions and fragmentation of the endplates with increased paravertebral inflammatory changes and soft tissue density, moderate spinal canal stenosis, patchy atelectasis versus infiltrate lung bases, cholelithiasis, stable right adrenal adenoma, degenerative changes lumbar spine with severe spinal canal stenosis L2-3, L4-5.  MR T-spine with substantial progression T8-9 discitis/osteomyelitis since 3/17, eroded endplates with 25% loss of T8 vertebral body height since that time, probable septic facet joints, increased paraspinal phlegmon and can fluid epidural and dural thickening, increased spinal stenosis T8-9 spinal cord mass effect but with no cord edema.   ?  ?Seen by neurosurgery, recommend medical management, continue IV antibiotics.  Infectious disease consulted and recommending continuing IV Ceftriaxone until 06/27/21.  Hospital service consulted  for further evaluation and management of thoracic spine progressive discitis/osteomyelitis. ?  ?Given his Nausea and Vomiting, GI was consulted and offered EGD but patient refused and recommending Scheduled Zofran and PPI BID.Marland Kitchen Diet advanced to Soft Diet  and continue to monitor LFTs. LFTs silightly elevated and patient has not had a Bowel Movement since Saturday but refusing bowel regimen but was agreeable to taking the bisacodyl and subsequently agreed to a smog enema with some small results.  We will give him another smog enema and continue bowel regimen for now but he is continually is refusing his bowel regimen and refused second SMOG. ?  ?PT OT evaluated and recommended CIR but patient did not demonstrate the ability for intensive rehab required to admit to CIR so now recommendations for him go to SNF. Patient given SNF choices and [facility]s and awaiting bed offer and [facility].    ? ? ?Assessment & Plan: ?  ?Principal Problem: ?  Thoracic discitis ?Active Problems: ?  Oral thrush ?  Poor dentition ?  Essential hypertension ?  Class 1 obesity due to excess calories with body mass index (BMI) of 34.0 to 34.9 in adult ?  Pressure injury of skin ? ?#1 T8-9 discitis/osteomyelitis ?-Patient presented with progressive back pain. ?-Imaging done concerning for substantial progression of T8-9 discitis/osteomyelitis since 3/17, eroded endplates with 25% loss of T8 vertebral body height since that time, probable septic facet joint, increased paraspinal phlegmon and dural thickening, increased spinal stenosis T8-9 spinal cord mass effect but with no cord edema. ?-CRP noted to be elevated at 10.2, lactic acid at 2.5, WBC at 12.3 on admission. ?-Patient assessed by neurosurgery who recommended medical management  with IV antibiotics per ID directions and no surgical input at this time ?-Leukocytosis trended down. ?-Patient underwent aspirate cultures  done which showed no growth aerobically or anaerobically. ?-Blood cultures with no growth to date ?-Status post T8-9 disc space aspiration per IR 06/13/2021 with cultures with no growth. ?-Continue current pain regimen of ketorolac 15 mg every 8 hours x5 days. ?-Continue oxycodone as needed. ?-Continue IV Dilaudid as needed as needed for severe breakthrough pain. ?-Patient seen by ID recommending continuation of Rocephin 2 g daily until 06/27/2021, after completion of IV antibiotics will need an additional 2 weeks of cefadroxil and subsequently consideration for reimaging at some point. ?-Noted to have outpatient follow-up initially set up with Dr. Renold DonVu however patient currently still hospitalized and will need to be rescheduled for outpatient follow-up with ID. ?-Patient seen by PT who recommended CIR but patient not a candidate and will need to go to SNF. ? ?2.  Intermittent nausea/vomiting ?-Patient noted to have some episodic nausea/vomiting seen in the ED several times. ?-CT abdomen and pelvis done negative for obstruction, no increased stool burden, small gallstones. ?-Concern for probable esophageal candidiasis with known recurrent oral thrush versus atypical presentation of gallstones. ?-GI was consulted offered EGD but patient declined. ?-GI recommending continuation of Zofran 4 mg p.o./IV every 8. ?-Continue Compazine as needed for refractory nausea and vomiting. ?-Continue IV PPI. ?-Follow-up KUB done with no acute abnormalities. ?-Diet advanced however patient noted to be afraid of eating given queasiness. ?-IV antiemetics, supportive care. ? ?3. transaminitis ?-Felt likely secondary to progressive infection from osteomyelitis versus medication side effects with Rocephin. ?-Acute hepatitis panel negative. ?-Transaminitis trending down. ?-Follow. ? ?4.  Oral thrush ?-Consistently and subsequently started on IV fluconazole 200 mg daily. ? ?5.  Hypertension ?-Antihypertensive medications initially held due to  soft blood pressure. ?-Continue scheduled IV Lopressor. ? ?6.  Hyponatremia ?-Likely secondary to hypovolemic hyponatremia. ?-Improving. ?-Follow. ? ?7.  Constipation ?-Patient placed on a bowel regimen that he has been refusing. ?-Patient was placed on bisacodyl 5 mg daily as needed for moderate constipation and refused bisacodyl 10 mg x 1. ?-Smog enema was ordered and had a small bowel movement, repeat smog ordered and patient noted to have refused. ?-Patient noted to be refusing other laxatives and agreeable to Senokot 2 tabs twice daily which we will continue. ?-Supportive care. ? ?8.  Bright red blood per rectum ?-Likely secondary to constipation and hemorrhoids. ?-H&H stable. ?-Patient seen in consultation by GI earlier on during the hospitalization however refused EGD. ?-Outpatient follow-up. ? ?9.  Dental pain ?-Orthopantogram done with multiple missing teeth and dental caries and periapical lucencies. ?-Currently on IV antibiotics. ?-Outpatient follow-up with dentistry. ? ?10.  Hypoalbuminemia ?-Nutritional supplementation. ?-Dietitian consulted and following. ? ?11. physical deconditioning ?-Seen by PT OT who are currently recommending SNF. ?-CIR initially recommended however patient did not demonstrate the ability for intensive rehab. ?-SNF options given and [facility] and currently SNF placement under review and insurance authorization pending. ? ?12.  Bilateral vision blurriness, stable ?-Patient with some complaints of bilateral visual blurriness, does not wear glasses however noted to have reading glasses. ?-CT head done negative. ?-Outpatient follow-up with ophthalmology. ? ?13.  Obesity ?-Weight loss and dietary counseling given, patient noted need for aggressive lifestyle changes/weight loss as it complicates all facets of his care. ?-Outpatient follow-up with PCP. ? ? ?DVT prophylaxis: SCDs ?Code Status: Full ?Family Communication: Updated patient.  No family at  bedside. ?Disposition: SNF ? ?Status is: Inpatient ?  Remains inpatient appropriate because: Severity of illness ?  ?Consultants:  ?Infectious disease: Dr. Thedore Mins 06/12/2021 ?Neurosurgery: Dr. Jake Samples 06/12/2021 ?Renae Gloss

## 2021-06-25 NOTE — Progress Notes (Signed)
In very low spirits entire shift.  Talks himself out of any mobility, even rolling in bed.  States that he's "just not up to it."  Tried encouraging and motivating patient with no success.  Patient transferred to [facility] with little assist after lengthy persuasion ? ?

## 2021-06-26 DIAGNOSIS — I10 Essential (primary) hypertension: Secondary | ICD-10-CM | POA: Diagnosis not present

## 2021-06-26 DIAGNOSIS — M869 Osteomyelitis, unspecified: Secondary | ICD-10-CM | POA: Diagnosis not present

## 2021-06-26 DIAGNOSIS — M4644 Discitis, unspecified, thoracic region: Secondary | ICD-10-CM | POA: Diagnosis not present

## 2021-06-26 DIAGNOSIS — F4321 Adjustment disorder with depressed mood: Secondary | ICD-10-CM

## 2021-06-26 DIAGNOSIS — E6609 Other obesity due to excess calories: Secondary | ICD-10-CM | POA: Diagnosis not present

## 2021-06-26 DIAGNOSIS — G4700 Insomnia, unspecified: Secondary | ICD-10-CM

## 2021-06-26 LAB — CBC WITH DIFFERENTIAL/PLATELET
Abs Immature Granulocytes: 0.07 10*3/uL (ref 0.00–0.07)
Basophils Absolute: 0.1 10*3/uL (ref 0.0–0.1)
Basophils Relative: 1 %
Eosinophils Absolute: 0.2 10*3/uL (ref 0.0–0.5)
Eosinophils Relative: 3 %
HCT: 38.6 % — ABNORMAL LOW (ref 39.0–52.0)
Hemoglobin: 12.8 g/dL — ABNORMAL LOW (ref 13.0–17.0)
Immature Granulocytes: 1 %
Lymphocytes Relative: 27 %
Lymphs Abs: 1.8 10*3/uL (ref 0.7–4.0)
MCH: 29.5 pg (ref 26.0–34.0)
MCHC: 33.2 g/dL (ref 30.0–36.0)
MCV: 88.9 fL (ref 80.0–100.0)
Monocytes Absolute: 0.9 10*3/uL (ref 0.1–1.0)
Monocytes Relative: 14 %
Neutro Abs: 3.6 10*3/uL (ref 1.7–7.7)
Neutrophils Relative %: 54 %
Platelets: 209 10*3/uL (ref 150–400)
RBC: 4.34 MIL/uL (ref 4.22–5.81)
RDW: 14.8 % (ref 11.5–15.5)
WBC: 6.7 10*3/uL (ref 4.0–10.5)
nRBC: 0 % (ref 0.0–0.2)

## 2021-06-26 LAB — COMPREHENSIVE METABOLIC PANEL
ALT: 64 U/L — ABNORMAL HIGH (ref 0–44)
AST: 33 U/L (ref 15–41)
Albumin: 2.4 g/dL — ABNORMAL LOW (ref 3.5–5.0)
Alkaline Phosphatase: 99 U/L (ref 38–126)
Anion gap: 10 (ref 5–15)
BUN: 13 mg/dL (ref 6–20)
CO2: 24 mmol/L (ref 22–32)
Calcium: 8.7 mg/dL — ABNORMAL LOW (ref 8.9–10.3)
Chloride: 100 mmol/L (ref 98–111)
Creatinine, Ser: 1 mg/dL (ref 0.61–1.24)
GFR, Estimated: >60 mL/min (ref >60)
Glucose, Bld: 181 mg/dL — ABNORMAL HIGH (ref 70–99)
Potassium: 3.3 mmol/L — ABNORMAL LOW (ref 3.5–5.1)
Sodium: 134 mmol/L — ABNORMAL LOW (ref 135–145)
Total Bilirubin: 0.4 mg/dL (ref 0.3–1.2)
Total Protein: 6.3 g/dL — ABNORMAL LOW (ref 6.5–8.1)

## 2021-06-26 LAB — MAGNESIUM: Magnesium: 1.9 mg/dL (ref 1.7–2.4)

## 2021-06-26 MED ORDER — OXYCODONE HCL 5 MG PO TABS
5.0000 mg | ORAL_TABLET | ORAL | Status: DC | PRN
  Administered 2021-06-27 (×2): 5 mg via ORAL
  Administered 2021-06-28 – 2021-07-01 (×9): 10 mg via ORAL
  Filled 2021-06-26 (×3): qty 2
  Filled 2021-06-26: qty 1
  Filled 2021-06-26 (×5): qty 2
  Filled 2021-06-26: qty 1
  Filled 2021-06-26: qty 2

## 2021-06-26 MED ORDER — SORBITOL 70 % SOLN
30.0000 mL | Status: AC
  Administered 2021-06-26 (×2): 30 mL via ORAL
  Filled 2021-06-26 (×3): qty 30

## 2021-06-26 MED ORDER — POLYETHYLENE GLYCOL 3350 17 G PO PACK
17.0000 g | PACK | Freq: Two times a day (BID) | ORAL | Status: DC
Start: 2021-06-26 — End: 2021-07-02
  Administered 2021-06-26 – 2021-07-02 (×5): 17 g via ORAL
  Filled 2021-06-26 (×13): qty 1

## 2021-06-26 MED ORDER — ACETAMINOPHEN 500 MG PO TABS
500.0000 mg | ORAL_TABLET | Freq: Three times a day (TID) | ORAL | Status: DC
Start: 2021-06-26 — End: 2021-07-02
  Administered 2021-06-27 – 2021-07-02 (×8): 500 mg via ORAL
  Filled 2021-06-26 (×14): qty 1

## 2021-06-26 MED ORDER — POTASSIUM CHLORIDE CRYS ER 20 MEQ PO TBCR
40.0000 meq | EXTENDED_RELEASE_TABLET | Freq: Once | ORAL | Status: AC
  Administered 2021-06-26: 40 meq via ORAL
  Filled 2021-06-26: qty 2

## 2021-06-26 MED ORDER — LISINOPRIL-HYDROCHLOROTHIAZIDE 10-12.5 MG PO TABS: 0.5000 | ORAL_TABLET | Freq: Every day | ORAL | Status: DC

## 2021-06-26 MED ORDER — LISINOPRIL 2.5 MG PO TABS
5.0000 mg | ORAL_TABLET | Freq: Every day | ORAL | Status: DC
Start: 2021-06-26 — End: 2021-06-29
  Administered 2021-06-26 – 2021-06-28 (×3): 5 mg via ORAL
  Filled 2021-06-26 (×4): qty 2

## 2021-06-26 MED ORDER — HYDROCHLOROTHIAZIDE 12.5 MG PO TABS
6.2500 mg | ORAL_TABLET | Freq: Every day | ORAL | Status: DC
  Administered 2021-06-26 – 2021-06-28 (×3): 6.25 mg via ORAL
  Filled 2021-06-26 (×4): qty 1

## 2021-06-26 NOTE — Consult Note (Signed)
[facility] Face-to-Face Psychiatry Consult  ? ?Reason for Consult: Evaluate for depression ?Referring Physician: Dr. Janee Morn ?Patient Identification: Michael [name] ?MRN:  [id number] ?Principal Diagnosis: Thoracic discitis ?Diagnosis:  Principal Problem: ?  Thoracic discitis ?Active Problems: ?  Essential hypertension ?  Class 1 obesity due to excess calories with body mass index (BMI) of 34.0 to 34.9 in adult ?  Oral thrush ?  Poor dentition ?  Pressure injury of skin ?  Nausea and vomiting ?  Constipation ? ? ?Total Time spent with patient: 1 hour ? ?Subjective:   ?Michael [name] is a 61 y.o. male patient admitted with Osteodiscitis.  Patient is seen and assessed.  Discussed with patient reason for psychiatric evaluation, in which he agrees to participate fully.  Patient is observed to be lying flat in bed, and presents with depressed mood, flat affect.  While he denies any depressive symptoms currently for the past 2 weeks, clinically he does present as depressed.  He states his current presentation is solely due to his level of pain.  He explains he has never been in pain this way, and is begging that someone do something for him.  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  He also verbalizes and understands the importance of every 2 hours turns, reduction of pressure ulcers, and improvement in physical outcomes with early mobility.  He strongly feels if his pain can be managed appropriately, he will began to participate in physical mobility and rehabilitation. ? ? Pt denies SI or HI. Pt denies etoh or drug use, urine drug screen and BAL negative on admission.  Pt alert and oriented x3. Patient seen and reassessed by the psychiatric nurse practitioner; chart reviewed.  On evaluation Michael [name] ? is observed to be lying in bed, eyes closed however he does wake up and acknowledge entry into the room.  He also raises the head of his bed as an to participate, however can only raise  his head of bed up to about 20 degrees before experiencing an increase in pain.  Patient does show willingness to participate in his treatment plan, denies having any comments questions or concerns.  He is able to verbalize wanting to participate in physical therapy and treatment, and importance of taking his medications. Patient shows insight into mental illness as it correlates to his physical mobility, and use of antidepressants to help with/and prevent depression, which will likely result in increase in pain if not treated appropriately.  Patient continues to denies suicidal ideations, homicidal ideations, and or auditory or visual hallucinations.  Patient reports tolerating well his oral medication,.  While patient is not at baseline, he does not express any interest in initiation of psychotropic medication he can be used to help with depressive symptoms and reduction in pain.  Will psychiatrically clear at this time.  He does not appear to be responding to internal stimuli, external stimuli, and or exhibiting delusional thought disorder.    ? ?HPI:  his is a 61 year old male with a history of T8-9 osteodiscitis, diagnosed in March 2023.  Had been undergoing treatment with IV antibiotics, Ancef, initial disc aspirate grew E. coli, pansensitive.  He states his back pain has been progressively worsening and therefore he was brought to the emergency department due to severe pain.  He was having difficulty getting up due to generalized weakness, also complained of nausea and vomiting and difficulty with oral intake.  He denies any bowel or bladder changes,  denies any groin numbness.  Denies any focal weakness in his legs, complains of some intermittent left calf numbness while standing. ? ? ?Past Psychiatric History: Denies ? ?Risk to Self:   Denies ?Risk to Others:   Denies ?Prior Inpatient Therapy:  Denies ?Prior Outpatient Therapy:   Denies ? ?Past Medical History:  ?Past Medical History:  ?Diagnosis Date  ?  Class 1 obesity due to excess calories with body mass index (BMI) of 34.0 to 34.9 in adult   ? Diskitis   ? Hypertension   ?  ?Past Surgical History:  ?Procedure Laterality Date  ? IR CERVICAL/THORACIC DISC ASPIRATION W/IMAG GUIDE  05/18/2021  ? IR FLUORO GUIDED NEEDLE PLC ASPIRATION/INJECTION LOC  06/13/2021  ? ?Family History: History reviewed. No pertinent family history. ?Family Psychiatric  History: Denies ?Social History:  ?Social History  ? ?Substance and Sexual Activity  ?Alcohol Use Not Currently  ?   ?Social History  ? ?Substance and Sexual Activity  ?Drug Use Never  ?  ?Social History  ? ?Socioeconomic History  ? Marital status: Married  ?  Spouse name: Not on file  ? Number of children: Not on file  ? Years of education: Not on file  ? Highest education level: Not on file  ?Occupational History  ? Occupation: truck Hospital doctordriver  ?Tobacco Use  ? Smoking status: Never  ? Smokeless tobacco: Never  ?Vaping Use  ? Vaping Use: Never used  ?Substance and Sexual Activity  ? Alcohol use: Not Currently  ? Drug use: Never  ? Sexual activity: Not Currently  ?Other Topics Concern  ? Not on file  ?Social History Narrative  ? Not on file  ? ?Social Determinants of Health  ? ?Financial Resource Strain: Not on file  ?Food Insecurity: Not on file  ?Transportation Needs: Not on file  ?Physical Activity: Not on file  ?Stress: Not on file  ?Social Connections: Not on file  ? ?Additional Social History: ?  ? ?Allergies:  No Known Allergies ? ?Labs:  ?Results for orders placed or performed during the hospital encounter of 06/11/21 (from the past 48 hour(s))  ?CBC with Differential/Platelet     Status: None  ? Collection Time: 06/25/21  5:00 AM  ?Result Value Ref Range  ? WBC 5.8 4.0 - 10.5 K/uL  ? RBC 4.69 4.22 - 5.81 MIL/uL  ? Hemoglobin 13.6 13.0 - 17.0 g/dL  ? HCT 41.6 39.0 - 52.0 %  ? MCV 88.7 80.0 - 100.0 fL  ? MCH 29.0 26.0 - 34.0 pg  ? MCHC 32.7 30.0 - 36.0 g/dL  ? RDW 15.0 11.5 - 15.5 %  ? Platelets 224 150 - 400 K/uL  ? nRBC  0.0 0.0 - 0.2 %  ? Neutrophils Relative % 45 %  ? Neutro Abs 2.6 1.7 - 7.7 K/uL  ? Lymphocytes Relative 34 %  ? Lymphs Abs 2.0 0.7 - 4.0 K/uL  ? Monocytes Relative 16 %  ? Monocytes Absolute 0.9 0.1 - 1.0 K/uL  ? Eosinophils Relative 3 %  ? Eosinophils Absolute 0.2 0.0 - 0.5 K/uL  ? Basophils Relative 1 %  ? Basophils Absolute 0.0 0.0 - 0.1 K/uL  ? Immature Granulocytes 1 %  ? Abs Immature Granulocytes 0.06 0.00 - 0.07 K/uL  ?  Comment: Performed at [facility], [address] [place], [place] [address]  ?Comprehensive metabolic panel     Status: Abnormal  ? Collection Time: 06/25/21  5:00 AM  ?Result Value Ref Range  ?  Sodium 134 (L) 135 - 145 mmol/L  ? Potassium 3.7 3.5 - 5.1 mmol/L  ? Chloride 102 98 - 111 mmol/L  ? CO2 26 22 - 32 mmol/L  ? Glucose, Bld 103 (H) 70 - 99 mg/dL  ?  Comment: Glucose reference range applies only to samples taken after fasting for at least 8 hours.  ? BUN 13 6 - 20 mg/dL  ? Creatinine, Ser 0.93 0.61 - 1.24 mg/dL  ? Calcium 8.7 (L) 8.9 - 10.3 mg/dL  ? Total Protein 6.9 6.5 - 8.1 g/dL  ? Albumin 2.5 (L) 3.5 - 5.0 g/dL  ? AST 39 15 - 41 U/L  ? ALT 74 (H) 0 - 44 U/L  ? Alkaline Phosphatase 100 38 - 126 U/L  ? Total Bilirubin 0.5 0.3 - 1.2 mg/dL  ? GFR, Estimated >60 >60 mL/min  ?  Comment: (NOTE) ?Calculated using the CKD-EPI Creatinine Equation (2021) ?  ? Anion gap 6 5 - 15  ?  Comment: Performed at [facility], [address] [place], [place] [address]  ?Phosphorus     Status: None  ? Collection Time: 06/25/21  5:00 AM  ?Result Value Ref Range  ? Phosphorus 2.9 2.5 - 4.6 mg/dL  ?  Comment: Performed at [facility], [address], [place], [place] [address]  ?Magnesium     Status: None  ? Collection Time: 06/25/21  5:00 AM  ?Result Value Ref Range  ? Magnesium 2.2 1.7 - 2.4 mg/dL  ?  Comment: Performed at [facility], [address], [place], [place] [address]  ?Comprehensive metabolic panel     Status: Abnormal  ? Collection Time: 06/26/21  5:40 AM   ?Result Value Ref Range  ? Sodium 134 (L) 135 - 145 mmol/L  ? Potassium 3.3 (L) 3.5 - 5.1 mmol/L  ? Chloride 100 98 - 111 mmol/L  ? CO2 24 22 - 32 mmol/L  ? Glucose, Bld 181 (H) 70 - 99 mg/dL  ?  Comment: Glu

## 2021-06-26 NOTE — Plan of Care (Signed)
?  Problem: Health Behavior/Discharge Planning: ?Goal: Ability to manage health-related needs will improve ?Outcome: Progressing ?  ?Problem: Clinical Measurements: ?Goal: Ability to maintain clinical measurements within normal limits will improve ?Outcome: Progressing ?  ?Problem: Clinical Measurements: ?Goal: Will remain free from infection ?Outcome: Progressing ?  ?Problem: Activity: ?Goal: Risk for activity intolerance will decrease ?Outcome: Progressing ?  ?

## 2021-06-26 NOTE — Progress Notes (Signed)
?PROGRESS NOTE ? ? ? ?Michael [name]  [id number] DOB: Jul 14, 1960 DOA: 06/11/2021 ?PCP: Lavella Lemons, PA  ? ? ?Chief Complaint  ?Patient presents with  ? Back Pain  ? ? ?Brief Narrative:  ?Michael [name] is a 61 year old male with past medical history significant for essential hypertension, obesity who presented to [facility] ED on 4/12 with progressive back pain. Patient also reports persistent nausea/vomiting; and has been seen at the [facility], ED on multiple occasions for such complaint.  Patient recently hospitalized from 3/17 - 3/22 with findings of T8-9 discitis and E. coli bacteremia following recent dental infection.  Patient was discharged on Ancef to continue through 06/27/2021. ?  ?In the ED, WBC count 12.3, hemoglobin 16.2, platelets 309.  Sodium 133, potassium 3.2, chloride 98, CO2 23, glucose 178, BUN 12, creatinine 0.99.  Lipase 41, AST 51, ALT 109.  Total bilirubin 0.8.  CRP 10.2, lactic acid 2.4.  Urinalysis unrevealing.  UDS positive for opiates.  CT abdomen/pelvis with interval worsening of known discitis/hospital-itis at T8-T9 with increased bony erosions and fragmentation of the endplates with increased paravertebral inflammatory changes and soft tissue density, moderate spinal canal stenosis, patchy atelectasis versus infiltrate lung bases, cholelithiasis, stable right adrenal adenoma, degenerative changes lumbar spine with severe spinal canal stenosis L2-3, L4-5.  MR T-spine with substantial progression T8-9 discitis/osteomyelitis since 3/17, eroded endplates with D34-534 loss of T8 vertebral body height since that time, probable septic facet joints, increased paraspinal phlegmon and can fluid epidural and dural thickening, increased spinal stenosis T8-9 spinal cord mass effect but with no cord edema.   ?  ?Seen by neurosurgery, recommend medical management, continue IV antibiotics.  Infectious disease consulted and recommending continuing IV Ceftriaxone until 06/27/21.  Hospital service consulted  for further evaluation and management of thoracic spine progressive discitis/osteomyelitis. ?  ?Given his Nausea and Vomiting, GI was consulted and offered EGD but patient refused and recommending Scheduled Zofran and PPI BID.Marland Kitchen Diet advanced to Soft Diet  and continue to monitor LFTs. LFTs silightly elevated and patient has not had a Bowel Movement since Saturday but refusing bowel regimen but was agreeable to taking the bisacodyl and subsequently agreed to a smog enema with some small results.  We will give him another smog enema and continue bowel regimen for now but he is continually is refusing his bowel regimen and refused second SMOG. ?  ?PT OT evaluated and recommended CIR but patient did not demonstrate the ability for intensive rehab required to admit to CIR so now recommendations for him go to SNF. Patient given SNF choices and he is [facility] in [facility] [place] [facility] patient clinicals and awaiting bed offer and [employer].    ? ? ?Assessment & Plan: ?  ?Principal Problem: ?  Thoracic discitis ?Active Problems: ?  Oral thrush ?  Poor dentition ?  Essential hypertension ?  Class 1 obesity due to excess calories with body mass index (BMI) of 34.0 to 34.9 in adult ?  Pressure injury of skin ?  Nausea and vomiting ?  Constipation ? ?#1 T8-9 discitis/osteomyelitis ?-Patient presented with progressive back pain. ?-Imaging done concerning for substantial progression of T8-9 discitis/osteomyelitis since 3/17, eroded endplates with D34-534 loss of T8 vertebral body height since that time, probable septic facet joint, increased paraspinal phlegmon and dural thickening, increased spinal stenosis T8-9 spinal cord mass effect but with no cord edema. ?-CRP noted to be elevated at 10.2, lactic acid at 2.5, WBC at 12.3 on admission. ?-  Patient assessed by neurosurgery who recommended medical management with IV antibiotics per ID directions and no surgical input at this time ?-Leukocytosis trended  down. ?-Patient underwent aspirate cultures done which showed no growth aerobically or anaerobically. ?-Blood cultures with no growth to date ?-Status post T8-9 disc space aspiration per IR 06/13/2021 with cultures with no growth. ?-Continue current pain regimen of ketorolac 15 mg every 8 hours x5 days. ?-Continue oxycodone as needed. ?-Continue IV Dilaudid as needed as needed for severe breakthrough pain. ?-Patient seen by ID recommending continuation of Rocephin 2 g daily until 06/27/2021, after completion of IV antibiotics will need an additional 2 weeks of cefadroxil and subsequently consideration for reimaging at some point. ?-Place on scheduled Tylenol 500 mg 3 times daily. ?-Increase oxycodone to 5 to 10 mg every 4 hours as needed breakthrough pain. ?-Noted to have outpatient follow-up initially set up with Dr. Renold Don however patient currently still hospitalized and will need to be rescheduled for outpatient follow-up with ID. ?-Patient seen by PT who recommended CIR but patient not a candidate and will need to go to SNF. ? ?2.  Intermittent nausea/vomiting ?-Patient noted to have some episodic nausea/vomiting seen in the ED several times. ?-CT abdomen and pelvis done negative for obstruction, no increased stool burden, small gallstones. ?-Concern for probable esophageal candidiasis with known recurrent oral thrush versus atypical presentation of gallstones. ?-GI was consulted offered EGD but patient declined. ?-GI recommending continuation of Zofran 4 mg p.o./IV every 8. ?-Continue Compazine as needed for refractory nausea and vomiting. ?-Continue IV PPI. ?-Follow-up KUB done with no acute abnormalities. ?-Diet advanced however patient noted to be afraid of eating given queasiness. ?-IV antiemetics, supportive care. ?-Continue current bowel regimen. ? ?3. transaminitis ?-Felt likely secondary to progressive infection from osteomyelitis versus medication side effects with Rocephin. ?-Acute hepatitis panel  negative. ?-Transaminitis trending down. ?-Outpatient follow-up. ?-Follow. ? ?4.  Oral thrush ?-Patient due to refusal of oral medications intermittently was subsequently started on IV fluconazole 200 mg daily. ?-Nystatin swish and swallow. ? ?5.  Hypertension ?-Antihypertensive medications initially held due to soft blood pressure. ?-Discontinue IV Lopressor and resume home regimen lisinopril/HCTZ.  ? ?6.  Hyponatremia ?-Likely secondary to hypovolemic hyponatremia. ?-Improving. ?-Follow. ? ?7.  Constipation ?-Patient placed on a bowel regimen that he has been refusing. ?-Patient was placed on bisacodyl 5 mg daily as needed for moderate constipation and refused bisacodyl 10 mg x 1. ?-Smog enema was ordered and had a small bowel movement, repeat smog ordered and patient noted to have refused. ?-Patient noted to be refusing other laxatives and agreeable to Senokot 2 tabs twice daily which we will continue. ?-Place on MiraLAX twice daily. ?-Supportive care. ? ?8.  Bright red blood per rectum ?-Likely secondary to constipation and hemorrhoids. ?-H&H stable. ?-Patient seen in consultation by GI earlier on during the hospitalization however refused EGD. ?-Outpatient follow-up. ? ?9.  Dental pain ?-Orthopantogram done with multiple missing teeth and dental caries and periapical lucencies. ?-Currently on IV antibiotics. ?-Outpatient follow-up with dentistry. ? ?10.  Hypoalbuminemia ?-Nutritional supplementation. ?-Dietitian consulted and following. ? ?11. physical deconditioning ?-Seen by PT/ OT who are currently recommending SNF. ?-CIR initially recommended however patient did not demonstrate the ability for intensive rehab. ?-SNF options given and patient prefers [facility] and currently SNF placement under review and insurance authorization pending. ? ?12.  Bilateral vision blurriness, stable ?-Patient with some complaints of bilateral visual blurriness, does not wear glasses however noted to have reading  glasses. ?-CT head done  negative. ?-Outpatient follow-up with ophthalmology. ? ?13.  Obesity ?-Weight loss and dietary counseling given, patient noted need for aggressive lifestyle changes/weight loss as it complicates all f

## 2021-06-27 DIAGNOSIS — I10 Essential (primary) hypertension: Secondary | ICD-10-CM | POA: Diagnosis not present

## 2021-06-27 DIAGNOSIS — M869 Osteomyelitis, unspecified: Secondary | ICD-10-CM | POA: Diagnosis not present

## 2021-06-27 DIAGNOSIS — M4644 Discitis, unspecified, thoracic region: Secondary | ICD-10-CM | POA: Diagnosis not present

## 2021-06-27 DIAGNOSIS — E6609 Other obesity due to excess calories: Secondary | ICD-10-CM | POA: Diagnosis not present

## 2021-06-27 LAB — BASIC METABOLIC PANEL
Anion gap: 8 (ref 5–15)
BUN: 11 mg/dL (ref 6–20)
CO2: 25 mmol/L (ref 22–32)
Calcium: 8.8 mg/dL — ABNORMAL LOW (ref 8.9–10.3)
Chloride: 100 mmol/L (ref 98–111)
Creatinine, Ser: 1.01 mg/dL (ref 0.61–1.24)
GFR, Estimated: >60 mL/min (ref >60)
Glucose, Bld: 111 mg/dL — ABNORMAL HIGH (ref 70–99)
Potassium: 3.4 mmol/L — ABNORMAL LOW (ref 3.5–5.1)
Sodium: 133 mmol/L — ABNORMAL LOW (ref 135–145)

## 2021-06-27 LAB — CBC WITH DIFFERENTIAL/PLATELET
Abs Immature Granulocytes: 0.12 10*3/uL — ABNORMAL HIGH (ref 0.00–0.07)
Basophils Absolute: 0.1 10*3/uL (ref 0.0–0.1)
Basophils Relative: 1 %
Eosinophils Absolute: 0.2 10*3/uL (ref 0.0–0.5)
Eosinophils Relative: 3 %
HCT: 38 % — ABNORMAL LOW (ref 39.0–52.0)
Hemoglobin: 12.6 g/dL — ABNORMAL LOW (ref 13.0–17.0)
Immature Granulocytes: 2 %
Lymphocytes Relative: 28 %
Lymphs Abs: 2.3 10*3/uL (ref 0.7–4.0)
MCH: 29.4 pg (ref 26.0–34.0)
MCHC: 33.2 g/dL (ref 30.0–36.0)
MCV: 88.8 fL (ref 80.0–100.0)
Monocytes Absolute: 1.3 10*3/uL — ABNORMAL HIGH (ref 0.1–1.0)
Monocytes Relative: 16 %
Neutro Abs: 4.1 10*3/uL (ref 1.7–7.7)
Neutrophils Relative %: 50 %
Platelets: 214 10*3/uL (ref 150–400)
RBC: 4.28 MIL/uL (ref 4.22–5.81)
RDW: 15 % (ref 11.5–15.5)
WBC: 8 10*3/uL (ref 4.0–10.5)
nRBC: 0 % (ref 0.0–0.2)

## 2021-06-27 LAB — MAGNESIUM: Magnesium: 1.9 mg/dL (ref 1.7–2.4)

## 2021-06-27 MED ORDER — POTASSIUM CHLORIDE CRYS ER 10 MEQ PO TBCR: 40.0000 meq | EXTENDED_RELEASE_TABLET | Freq: Once | ORAL | Status: DC

## 2021-06-27 MED ORDER — POTASSIUM CHLORIDE 10 MEQ/100ML IV SOLN
10.0000 meq | INTRAVENOUS | Status: AC
  Administered 2021-06-27 (×4): 10 meq via INTRAVENOUS
  Filled 2021-06-27 (×4): qty 100

## 2021-06-27 MED ORDER — SIMETHICONE 80 MG PO CHEW
160.0000 mg | CHEWABLE_TABLET | Freq: Three times a day (TID) | ORAL | Status: DC
  Administered 2021-06-27 – 2021-07-02 (×7): 160 mg via ORAL
  Filled 2021-06-27 (×23): qty 2

## 2021-06-27 MED ORDER — GABAPENTIN 300 MG PO CAPS
300.0000 mg | ORAL_CAPSULE | Freq: Three times a day (TID) | ORAL | Status: DC
  Administered 2021-06-27 – 2021-06-28 (×4): 300 mg via ORAL
  Filled 2021-06-27 (×4): qty 1

## 2021-06-27 NOTE — Progress Notes (Signed)
Physical Therapy Treatment ?Patient Details ?Name: Michael [name] ?MRN: [id number] ?DOB: [birth year] ?Today's Date: 06/27/2021 ? ? ?History of Present Illness Michael [name] is a 61 y.o. male who presented to ED c/o back pain x 2 weeks. Pt was admitted for T8-T9 osteomyelitis. PMH: HTN, obesity, GERD ? ?  ?PT Comments  ? ? MD requested PT re-attempt. Pt's mother now presents in room and helping to encourage pt to mobilize. Pt continues to exhibit severe self-limiting behavior. With encouragement from therapist and his mother, pt agreeable to sit EOB. Once sitting EOB, pt performed a few LE exercises. Pt began reporting fatigue. Therapist requesting pt stand with RW and sidestep for better positioning in bed. Pt initially declining, stating "I can't." After several minutes of encouragement, and frank discussion about the importance of mobility and at least trying, pt agreeable. RN in room at this point and provided +2 assist/safety to boost pt's confidence. Pt stood min assist with RW and took 2-3 sidesteps. Pt returned to supine at end of session. Pt required continual verbal cues during session to maximize his active participation. ?   ?Recommendations for follow up therapy are one component of a multi-disciplinary discharge planning process, led by the attending physician.  Recommendations may be updated based on patient status, additional functional criteria and insurance authorization. ? ?Follow Up Recommendations ? Skilled nursing-short term rehab (<3 hours/day) ?  ?  ?Assistance Recommended at Discharge Frequent or constant Supervision/Assistance  ?Patient can return home with the following A little help with walking and/or transfers;A lot of help with bathing/dressing/bathroom;Assistance with cooking/housework;Assist for transportation;Help with stairs or ramp for entrance;Other (comment) ?  ?Equipment Recommendations ? None recommended by PT  ?  ?Recommendations for Other Services   ? ? ?  ?Precautions /  Restrictions Precautions ?Precautions: Fall ?Precaution Comments: morbid obesity, extremely self limiting ?Restrictions ?Other Position/Activity Restrictions: self limiting due to back pain  ?  ? ?Mobility ? Bed Mobility ?Overal bed mobility: Needs Assistance ?Bed Mobility: Supine to Sit, Sit to Supine ?  ?  ?Supine to sit: HOB elevated, Mod assist ?Sit to supine: Min assist ?  ?General bed mobility comments: +rail, increased time, cues for sequencing ?  ? ?Transfers ?Overall transfer level: Needs assistance ?Equipment used: Rolling walker (2 wheels) ?Transfers: Sit to/from Stand ?Sit to Stand: +2 safety/equipment, Min assist, From elevated surface ?  ?  ?  ?  ?  ?General transfer comment: Pt pulling up on RW. Therapist stabilizing RW with foot. RN provided +2 for safety to boost pt's confidence. ?  ? ?Ambulation/Gait ?Ambulation/Gait assistance: Min assist, +2 safety/equipment ?  ?Assistive device: Rolling walker (2 wheels) ?  ?  ?  ?  ?General Gait Details: 2-3 sidesteps with RW for improved positioning in bed prior to return to supine. ? ? ?Stairs ?  ?  ?  ?  ?  ? ? ?Wheelchair Mobility ?  ? ?Modified Rankin (Stroke Patients Only) ?  ? ? ?  ?Balance Overall balance assessment: Needs assistance ?Sitting-balance support: Feet supported, No upper extremity supported ?Sitting balance-Leahy Scale: Good ?  ?  ?Standing balance support: Bilateral upper extremity supported, During functional activity, Reliant on assistive device for balance ?Standing balance-Leahy Scale: Poor ?  ?  ?  ?  ?  ?  ?  ?  ?  ?  ?  ?  ?  ? ?  ?Cognition Arousal/Alertness: Awake/alert ?Behavior During Therapy: Flat affect ?Overall Cognitive Status: Within Functional Limits for tasks assessed ?  ?  ?  ?  ?  ?  ?  ?  ?  ?  ?  ?  ?  ?  ?  ?  ?  General Comments: Remains in depressed spirits wit severe self-limiting behaviors. States "I can't" when ask to complete any mobility tasks. Needs lots of encouragement and patience. ?  ?  ? ?  ?Exercises  General Exercises - Lower Extremity ?Ankle Circles/Pumps: AROM, Both, 10 reps, Seated ?Long Arc Quad: AROM, Right, Left, 10 reps, Seated ? ?  ?General Comments   ?  ?  ? ?Pertinent Vitals/Pain Pain Assessment ?Pain Assessment: Faces ?Faces Pain Scale: Hurts little more ?Pain Location: back ?Pain Descriptors / Indicators: Grimacing, Guarding, Discomfort ?Pain Intervention(s): Monitored during session, Limited activity within patient's tolerance, Repositioned (Pt reports he not had pain medicine all day, per his choice.)  ? ? ?Home Living   ?  ?  ?  ?  ?  ?  ?  ?  ?  ?   ?  ?Prior Function    ?  ?  ?   ? ?PT Goals (current goals can now be found in the care plan section) Acute Rehab PT Goals ?Patient Stated Goal: feel better ?Progress towards PT goals: Not progressing toward goals - comment (self limiting) ? ?  ?Frequency ? ? ? Min 3X/week ? ? ? ?  ?PT Plan Current plan remains appropriate  ? ? ?Co-evaluation   ?  ?  ?  ?  ? ?  ?AM-PAC PT "6 Clicks" Mobility   ?Outcome Measure ? Help needed turning from your back to your side while in a flat bed without using bedrails?: A Little ?Help needed moving from lying on your back to sitting on the side of a flat bed without using bedrails?: A Little ?Help needed moving to and from a bed to a chair (including a wheelchair)?: A Little ?Help needed standing up from a chair using your arms (e.g., wheelchair or bedside chair)?: A Little ?Help needed to walk in hospital room?: A Lot ?Help needed climbing 3-5 steps with a railing? : Total ?6 Click Score: 15 ? ?  ?End of Session Equipment Utilized During Treatment: Gait belt ?Activity Tolerance: Patient tolerated treatment well;Other (comment) (self limiting) ?Patient left: in bed;with call bell/phone within reach;with family/visitor present ?Nurse Communication: Mobility status ?PT Visit Diagnosis: Unsteadiness on feet (R26.81);Repeated falls (R29.6);Difficulty in walking, not elsewhere classified (R26.2) ?  ? ? ?Time: 8182-9937 ?PT  Time Calculation (min) (ACUTE ONLY): 26 min ? ?Charges:  $Therapeutic Exercise: 8-22 mins ?$Therapeutic Activity: 8-22 mins          ?          ? ?Aida Raider, PT  ?Office # [phone] ?Pager [phone] ? ? ? ?Ilda Foil ?06/27/2021, 12:42 PM ? ?

## 2021-06-27 NOTE — Plan of Care (Signed)

## 2021-06-27 NOTE — Progress Notes (Signed)
?PROGRESS NOTE ? ? ? ?Michael [name]  [id number] DOB: Jul 14, 1960 DOA: 06/11/2021 ?PCP: Lavella Lemons, PA  ? ? ?Chief Complaint  ?Patient presents with  ? Back Pain  ? ? ?Brief Narrative:  ?Michael [name] is a 61 year old male with past medical history significant for essential hypertension, obesity who presented to [facility] ED on 4/12 with progressive back pain. Patient also reports persistent nausea/vomiting; and has been seen at the [facility], ED on multiple occasions for such complaint.  Patient recently hospitalized from 3/17 - 3/22 with findings of T8-9 discitis and E. coli bacteremia following recent dental infection.  Patient was discharged on Ancef to continue through 06/27/2021. ?  ?In the ED, WBC count 12.3, hemoglobin 16.2, platelets 309.  Sodium 133, potassium 3.2, chloride 98, CO2 23, glucose 178, BUN 12, creatinine 0.99.  Lipase 41, AST 51, ALT 109.  Total bilirubin 0.8.  CRP 10.2, lactic acid 2.4.  Urinalysis unrevealing.  UDS positive for opiates.  CT abdomen/pelvis with interval worsening of known discitis/hospital-itis at T8-T9 with increased bony erosions and fragmentation of the endplates with increased paravertebral inflammatory changes and soft tissue density, moderate spinal canal stenosis, patchy atelectasis versus infiltrate lung bases, cholelithiasis, stable right adrenal adenoma, degenerative changes lumbar spine with severe spinal canal stenosis L2-3, L4-5.  MR T-spine with substantial progression T8-9 discitis/osteomyelitis since 3/17, eroded endplates with D34-534 loss of T8 vertebral body height since that time, probable septic facet joints, increased paraspinal phlegmon and can fluid epidural and dural thickening, increased spinal stenosis T8-9 spinal cord mass effect but with no cord edema.   ?  ?Seen by neurosurgery, recommend medical management, continue IV antibiotics.  Infectious disease consulted and recommending continuing IV Ceftriaxone until 06/27/21.  Hospital service consulted  for further evaluation and management of thoracic spine progressive discitis/osteomyelitis. ?  ?Given his Nausea and Vomiting, GI was consulted and offered EGD but patient refused and recommending Scheduled Zofran and PPI BID.Marland Kitchen Diet advanced to Soft Diet  and continue to monitor LFTs. LFTs silightly elevated and patient has not had a Bowel Movement since Saturday but refusing bowel regimen but was agreeable to taking the bisacodyl and subsequently agreed to a smog enema with some small results.  We will give him another smog enema and continue bowel regimen for now but he is continually is refusing his bowel regimen and refused second SMOG. ?  ?PT OT evaluated and recommended CIR but patient did not demonstrate the ability for intensive rehab required to admit to CIR so now recommendations for him go to SNF. Patient given SNF choices and he is interested in [facility] [facility] patient clinicals and awaiting bed offer and [employer].    ? ? ?Assessment & Plan: ?  ?Principal Problem: ?  Thoracic discitis ?Active Problems: ?  Oral thrush ?  Poor dentition ?  Essential hypertension ?  Class 1 obesity due to excess calories with body mass index (BMI) of 34.0 to 34.9 in adult ?  Pressure injury of skin ?  Nausea and vomiting ?  Constipation ? ?#1 T8-9 discitis/osteomyelitis ?-Patient presented with progressive back pain. ?-Imaging done concerning for substantial progression of T8-9 discitis/osteomyelitis since 3/17, eroded endplates with D34-534 loss of T8 vertebral body height since that time, probable septic facet joint, increased paraspinal phlegmon and dural thickening, increased spinal stenosis T8-9 spinal cord mass effect but with no cord edema. ?-CRP noted to be elevated at 10.2, lactic acid at 2.5, WBC at 12.3 on admission. ?-  Patient assessed by neurosurgery who recommended medical management with IV antibiotics per ID directions and no surgical input at this time ?-Leukocytosis trended  down. ?-Patient underwent aspirate cultures done which showed no growth aerobically or anaerobically. ?-Blood cultures with no growth to date ?-Status post T8-9 disc space aspiration per IR 06/13/2021 with cultures with no growth. ?-Continue current pain regimen of ketorolac 15 mg every 8 hours x5 days. ?-Continue oxycodone as needed. ?-Continue IV Dilaudid as needed as needed for severe breakthrough pain. ?-Patient seen by ID recommending continuation of Rocephin 2 g daily until 06/27/2021, after completion of IV antibiotics will need an additional 2 weeks of cefadroxil and subsequently consideration for reimaging at some point. ?-Continue scheduled Tylenol 500 mg 3 times daily. ?-Increase oxycodone to 5 to 10 mg every 4 hours as needed breakthrough pain. ?-Place on scheduled Neurontin 300 mg 3 times daily ?-Noted to have outpatient follow-up initially set up with Dr. Renold Don however patient currently still hospitalized and will need to be rescheduled for outpatient follow-up with ID. ?-Patient seen by PT who recommended CIR but patient not a candidate and will need to go to SNF. ? ?2.  Intermittent nausea/vomiting ?-Patient noted to have some episodic nausea/vomiting seen in the ED several times. ?-CT abdomen and pelvis done negative for obstruction, no increased stool burden, small gallstones. ?-Concern for probable esophageal candidiasis with known recurrent oral thrush versus atypical presentation of gallstones. ?-GI was consulted offered EGD but patient declined. ?-GI recommending continuation of Zofran 4 mg p.o./IV every 8. ?-Continue Compazine as needed for refractory nausea and vomiting. ?-Continue PPI. ?-Follow-up KUB done with no acute abnormalities. ?-Diet advanced however patient noted to be afraid of eating given queasiness. ?-IV antiemetics, supportive care. ?-Continue current bowel regimen. ? ?3. transaminitis ?-Felt likely secondary to progressive infection from osteomyelitis versus medication side effects  with Rocephin. ?-Acute hepatitis panel negative. ?-Transaminitis trending down. ?-Outpatient follow-up. ?-Follow. ? ?4.  Oral thrush ?-Patient due to refusal of oral medications intermittently was subsequently started on IV fluconazole 200 mg daily. ?-Nystatin swish and swallow. ? ?5.  Hypertension ?-Antihypertensive medications initially held due to soft blood pressure. ?-Discontinued IV Lopressor and resumed home regimen lisinopril/HCTZ.  ? ?6.  Hyponatremia ?-Likely secondary to hypovolemic hyponatremia. ?-Improving. ?-Follow. ? ?7.  Constipation ?-Patient placed on a bowel regimen that he has been refusing. ?-Patient was placed on bisacodyl 5 mg daily as needed for moderate constipation and refused bisacodyl 10 mg x 1. ?-Smog enema was ordered and had a small bowel movement, repeat smog ordered and patient noted to have refused. ?-Patient noted to be refusing other laxatives and agreeable to Senokot 2 tabs twice daily which we will continue. ?-Continue MiraLAX twice daily. ?-Patient stated had a large bowel movement yesterday. ?-Supportive care. ? ?8.  Bright red blood per rectum ?-Likely secondary to constipation and hemorrhoids. ?-H&H stable. ?-Patient seen in consultation by GI earlier on during the hospitalization however refused EGD. ?-Outpatient follow-up. ? ?9.  Dental pain ?-Orthopantogram done with multiple missing teeth and dental caries and periapical lucencies. ?-Currently on IV antibiotics. ?-Outpatient follow-up with dentistry. ? ?10.  Hypoalbuminemia ?-Nutritional supplementation. ?-Dietitian consulted and following. ? ?11. physical deconditioning ?-Seen by PT/ OT who are currently recommending SNF. ?-CIR initially recommended however patient did not demonstrate the ability for intensive rehab. ?-SNF options given and patient prefers [facility] and currently SNF placement under review and insurance authorization pending. ? ?12.  Bilateral vision blurriness, stable ?-Patient with some  complaints of bilateral visual  blurriness, does not wear glasses however noted to have reading glasses. ?-CT head done negative. ?-Outpatient follow-up with ophthalmology. ? ?13.  Obesity ?-Weight loss and dietary

## 2021-06-27 NOTE — Progress Notes (Signed)
PT Cancellation Note ? ?Patient Details ?Name: Michael [name] ?MRN: [id number] ?DOB: 20-Mar-1960 ? ? ?Cancelled Treatment:    Reason Eval/Treat Not Completed: Patient declined, no reason specified. Pt stating "therapy is not an option today. I have been overwhelmed with things the last few days, and I'm not up for moving." PT to continue to attempt and encourage pt, as able.  ? ? ?[name] ?06/27/2021, 10:04 AM ? ?[name], PT  ?Office # [phone] ?Pager [phone] ? ?

## 2021-06-27 NOTE — TOC Progression Note (Signed)
Transition of Care (TOC) - Progression Note  ? ? ?Patient Details  ?Name: Michael [name] ?MRN: [id number] ?Date of Birth: 1960/10/13 ? ?Transition of Care (TOC) CM/SW Contact  ?Baldemar Lenis, LCSW ?Phone Number: ?06/27/2021, 9:54 AM ? ?Clinical Narrative:   CSW attempted to contact [facility] admissions, unable to reach anyone. CSW contacted the building, the Admissions liaison is off today. DON at [facility] suggested that CSW contact the [facility]. CSW contacted Regional Director Tresa Endo and left a voicemail requesting call back with an update. Awaiting call back. ? ? ? ?Expected Discharge Plan: Skilled Nursing Facility ?Barriers to Discharge: Insurance Authorization ? ?Expected Discharge Plan and Services ?Expected Discharge Plan: Skilled Nursing Facility ?  ?Discharge Planning Services: CM Consult ?  ?Living arrangements for the past 2 months: Single Family Home ?                ?  ?  ?  ?  ?  ?HH Arranged: RN ?HH Agency:  Scientist, physiological) ?  ?  ?  ? ? ?Social Determinants of Health (SDOH) Interventions ?  ? ?Readmission Risk Interventions ?   ? View : No data to display.  ?  ?  ?  ? ? ?

## 2021-06-27 NOTE — TOC Progression Note (Signed)
Transition of Care (TOC) - Progression Note  ? ? ?Patient Details  ?Name: Michael [name] ?MRN: [id number] ?Date of Birth: 14-Mar-1960 ? ?Transition of Care (TOC) CM/SW Contact  ?Baldemar Lenis, LCSW ?Phone Number: ?06/27/2021, 9:55 AM ? ?Clinical Narrative:   CSW spoke with Admissions at [facility] and Berkley Harvey has been received but they do not have a male bed available today. Per Admissions, they are trying to rearrange rooms and are hopeful to have a male bed available over the weekend. CSW provided weekend coverage number for [facility] to follow up. CSW to follow. ? ? ? ?Expected Discharge Plan: Skilled Nursing Facility ?Barriers to Discharge: Insurance Authorization ? ?Expected Discharge Plan and Services ?Expected Discharge Plan: Skilled Nursing Facility ?  ?Discharge Planning Services: CM Consult ?  ?Living arrangements for the past 2 months: Single Family Home ?                ?  ?  ?  ?  ?  ?HH Arranged: RN ?HH Agency:  Scientist, physiological) ?  ?  ?  ? ? ?Social Determinants of Health (SDOH) Interventions ?  ? ?Readmission Risk Interventions ?   ? View : No data to display.  ?  ?  ?  ? ? ?

## 2021-06-28 DIAGNOSIS — M4644 Discitis, unspecified, thoracic region: Secondary | ICD-10-CM | POA: Diagnosis not present

## 2021-06-28 DIAGNOSIS — E6609 Other obesity due to excess calories: Secondary | ICD-10-CM | POA: Diagnosis not present

## 2021-06-28 DIAGNOSIS — I10 Essential (primary) hypertension: Secondary | ICD-10-CM | POA: Diagnosis not present

## 2021-06-28 DIAGNOSIS — M869 Osteomyelitis, unspecified: Secondary | ICD-10-CM | POA: Diagnosis not present

## 2021-06-28 LAB — BASIC METABOLIC PANEL
Anion gap: 7 (ref 5–15)
BUN: 10 mg/dL (ref 6–20)
CO2: 25 mmol/L (ref 22–32)
Calcium: 9 mg/dL (ref 8.9–10.3)
Chloride: 103 mmol/L (ref 98–111)
Creatinine, Ser: 1.01 mg/dL (ref 0.61–1.24)
GFR, Estimated: >60 mL/min (ref >60)
Glucose, Bld: 109 mg/dL — ABNORMAL HIGH (ref 70–99)
Potassium: 3.9 mmol/L (ref 3.5–5.1)
Sodium: 135 mmol/L (ref 135–145)

## 2021-06-28 MED ORDER — GABAPENTIN 300 MG PO CAPS
300.0000 mg | ORAL_CAPSULE | Freq: Two times a day (BID) | ORAL | Status: DC
  Administered 2021-06-28 – 2021-07-02 (×8): 300 mg via ORAL
  Filled 2021-06-28 (×8): qty 1

## 2021-06-28 NOTE — Progress Notes (Signed)
?PROGRESS NOTE ? ? ? ?Michael [name]  [id number] DOB: Jul 14, 1960 DOA: 06/11/2021 ?PCP: Lavella Lemons, PA  ? ? ?Chief Complaint  ?Patient presents with  ? Back Pain  ? ? ?Brief Narrative:  ?Michael [name] is a 61 year old male with past medical history significant for essential hypertension, obesity who presented to [facility] ED on 4/12 with progressive back pain. Patient also reports persistent nausea/vomiting; and has been seen at the [facility], ED on multiple occasions for such complaint.  Patient recently hospitalized from 3/17 - 3/22 with findings of T8-9 discitis and E. coli bacteremia following recent dental infection.  Patient was discharged on Ancef to continue through 06/27/2021. ?  ?In the ED, WBC count 12.3, hemoglobin 16.2, platelets 309.  Sodium 133, potassium 3.2, chloride 98, CO2 23, glucose 178, BUN 12, creatinine 0.99.  Lipase 41, AST 51, ALT 109.  Total bilirubin 0.8.  CRP 10.2, lactic acid 2.4.  Urinalysis unrevealing.  UDS positive for opiates.  CT abdomen/pelvis with interval worsening of known discitis/hospital-itis at T8-T9 with increased bony erosions and fragmentation of the endplates with increased paravertebral inflammatory changes and soft tissue density, moderate spinal canal stenosis, patchy atelectasis versus infiltrate lung bases, cholelithiasis, stable right adrenal adenoma, degenerative changes lumbar spine with severe spinal canal stenosis L2-3, L4-5.  MR T-spine with substantial progression T8-9 discitis/osteomyelitis since 3/17, eroded endplates with D34-534 loss of T8 vertebral body height since that time, probable septic facet joints, increased paraspinal phlegmon and can fluid epidural and dural thickening, increased spinal stenosis T8-9 spinal cord mass effect but with no cord edema.   ?  ?Seen by neurosurgery, recommend medical management, continue IV antibiotics.  Infectious disease consulted and recommending continuing IV Ceftriaxone until 06/27/21.  Hospital service consulted  for further evaluation and management of thoracic spine progressive discitis/osteomyelitis. ?  ?Given his Nausea and Vomiting, GI was consulted and offered EGD but patient refused and recommending Scheduled Zofran and PPI BID.Marland Kitchen Diet advanced to Soft Diet  and continue to monitor LFTs. LFTs silightly elevated and patient has not had a Bowel Movement since Saturday but refusing bowel regimen but was agreeable to taking the bisacodyl and subsequently agreed to a smog enema with some small results.  We will give him another smog enema and continue bowel regimen for now but he is continually is refusing his bowel regimen and refused second SMOG. ?  ?PT OT evaluated and recommended CIR but patient did not demonstrate the ability for intensive rehab required to admit to CIR so now recommendations for him go to SNF. Patient given SNF choices and he is interested in [facility] [place] [facility] [place] patient clinicals and awaiting bed offer and [employer].    ? ? ?Assessment & Plan: ?  ?Principal Problem: ?  Thoracic discitis ?Active Problems: ?  Oral thrush ?  Poor dentition ?  Essential hypertension ?  Class 1 obesity due to excess calories with body mass index (BMI) of 34.0 to 34.9 in adult ?  Pressure injury of skin ?  Nausea and vomiting ?  Constipation ? ?#1 T8-9 discitis/osteomyelitis ?-Patient presented with progressive back pain. ?-Imaging done concerning for substantial progression of T8-9 discitis/osteomyelitis since 3/17, eroded endplates with D34-534 loss of T8 vertebral body height since that time, probable septic facet joint, increased paraspinal phlegmon and dural thickening, increased spinal stenosis T8-9 spinal cord mass effect but with no cord edema. ?-CRP noted to be elevated at 10.2, lactic acid at 2.5, WBC at 12.3 on admission. ?-  Patient assessed by neurosurgery who recommended medical management with IV antibiotics per ID directions and no surgical input at this time ?-Leukocytosis trended  down. ?-Patient underwent aspirate cultures done which showed no growth aerobically or anaerobically. ?-Blood cultures with no growth to date ?-Status post T8-9 disc space aspiration per IR 06/13/2021 with cultures with no growth. ?-Continue current pain regimen of ketorolac 15 mg every 8 hours x5 days. ?-Continue oxycodone as needed. ?-Continue IV Dilaudid as needed as needed for severe breakthrough pain. ?-Patient seen by ID recommending continuation of Rocephin 2 g daily until 06/27/2021, on patient now currently on oral cefadroxil for an additional 2 weeks per ID recommendations and subsequent consideration for reimaging sometime post antibiotic completion.  ?-Continue scheduled Tylenol 500 mg 3 times daily. ?-Continue oxycodone to 5 to 10 mg every 4 hours as needed breakthrough pain. ?-Place on scheduled Neurontin 300 mg 3 times daily which will decrease to twice daily. ?-Noted to have outpatient follow-up initially set up with Dr. Renold Don however patient currently still hospitalized and will need to be rescheduled for outpatient follow-up with ID. ?-Patient seen by PT who recommended CIR but patient not a candidate and will need to go to SNF. ? ?2.  Intermittent nausea/vomiting ?-Patient noted to have some episodic nausea/vomiting seen in the ED several times. ?-CT abdomen and pelvis done negative for obstruction, no increased stool burden, small gallstones. ?-Concern for probable esophageal candidiasis with known recurrent oral thrush versus atypical presentation of gallstones. ?-GI was consulted offered EGD but patient declined. ?-GI recommending continuation of Zofran 4 mg p.o./IV every 8. ?-Continue Compazine as needed for refractory nausea and vomiting. ?-Continue PPI. ?-Follow-up KUB done with no acute abnormalities. ?-Diet advanced however patient noted to be afraid of eating given queasiness which improved. ?-IV antiemetics, supportive care. ?-Continue current bowel regimen. ? ?3. transaminitis ?-Felt likely  secondary to progressive infection from osteomyelitis versus medication side effects with Rocephin. ?-Acute hepatitis panel negative. ?-Transaminitis trending down. ?-Outpatient follow-up. ?-Follow. ? ?4.  Oral thrush ?-Patient due to refusal of oral medications intermittently was subsequently started on IV fluconazole 200 mg daily. ?-Nystatin swish and swallow. ? ?5.  Hypertension ?-Antihypertensive medications initially held due to soft blood pressure. ?-Was on IV Lopressor that has subsequently been discontinued and patient resumed back on home regimen of lisinopril/ HCTZ. ? ?6.  Hyponatremia ?-Likely secondary to hypovolemic hyponatremia. ?-Improved. ? ?7.  Constipation ?-Patient placed on a bowel regimen that he has been refusing. ?-Patient was placed on bisacodyl 5 mg daily as needed for moderate constipation and refused bisacodyl 10 mg x 1. ?-Smog enema was ordered and had a small bowel movement, repeat smog ordered and patient noted to have refused. ?-Patient noted to be refusing other laxatives and agreeable to Senokot 2 tabs twice daily which we will continue. ?-Continue MiraLAX twice daily. ?-Patient stated had a large bowel movement 06/26/2021 ?-Continue current bowel regimen. ?-Supportive care. ? ?8.  Bright red blood per rectum ?-Likely secondary to constipation and hemorrhoids. ?-H&H stable at 12.6. ?-Patient seen in consultation by GI earlier on during the hospitalization however refused EGD. ?-Outpatient follow-up. ? ?9.  Dental pain ?-Orthopantogram done with multiple missing teeth and dental caries and periapical lucencies. ?-Currently on antibiotics. ?-Outpatient follow-up with dentistry. ? ?10.  Hypoalbuminemia ?-Nutritional supplementation. ?-Dietitian consulted and following. ? ?11. physical deconditioning ?-Seen by PT/ OT who are currently recommending SNF. ?-CIR initially recommended however patient did not demonstrate the ability for intensive rehab. ?-SNF options given and patient prefers  [facility] and currently SNF placement under review and insurance authorization pending. ? ?12.  Bilateral vision blurriness, stable ?-Patient with some complaints of bilateral visual blurriness, does n

## 2021-06-29 DIAGNOSIS — M869 Osteomyelitis, unspecified: Secondary | ICD-10-CM | POA: Diagnosis not present

## 2021-06-29 DIAGNOSIS — M4644 Discitis, unspecified, thoracic region: Secondary | ICD-10-CM | POA: Diagnosis not present

## 2021-06-29 DIAGNOSIS — I10 Essential (primary) hypertension: Secondary | ICD-10-CM | POA: Diagnosis not present

## 2021-06-29 DIAGNOSIS — E6609 Other obesity due to excess calories: Secondary | ICD-10-CM | POA: Diagnosis not present

## 2021-06-29 MED ORDER — HYDROCHLOROTHIAZIDE 12.5 MG PO TABS
6.2500 mg | ORAL_TABLET | Freq: Every day | ORAL | Status: DC
  Administered 2021-06-30 – 2021-07-01 (×2): 6.25 mg via ORAL
  Filled 2021-06-29 (×2): qty 1

## 2021-06-29 MED ORDER — LISINOPRIL 2.5 MG PO TABS
5.0000 mg | ORAL_TABLET | Freq: Every day | ORAL | Status: DC
  Administered 2021-06-30 – 2021-07-01 (×2): 5 mg via ORAL
  Filled 2021-06-29 (×2): qty 2

## 2021-06-29 NOTE — Progress Notes (Signed)
Spoke to [place] with [facility] who reports no bed available until tomorrow. MD updated. SW will f/u with [facility] in the AM.  ? ?Dellie Burns, MSW, LCSW ?[phone] (coverage) ? ? ?

## 2021-06-29 NOTE — Progress Notes (Signed)
?PROGRESS NOTE ? ? ? ?[name]  [id number] DOB: Jul 14, 1960 DOA: 06/11/2021 ?PCP: Lavella Lemons, PA  ? ? ?Chief Complaint  ?Patient presents with  ? Back Pain  ? ? ?Brief Narrative:  ?RMANI SALVAS is a 61 year old male with past medical history significant for essential hypertension, obesity who presented to [facility] ED on 4/12 with progressive back pain. Patient also reports persistent nausea/vomiting; and has been seen at the [facility], ED on multiple occasions for such complaint.  Patient recently hospitalized from 3/17 - 3/22 with findings of T8-9 discitis and E. coli bacteremia following recent dental infection.  Patient was discharged on Ancef to continue through 06/27/2021. ?  ?In the ED, WBC count 12.3, hemoglobin 16.2, platelets 309.  Sodium 133, potassium 3.2, chloride 98, CO2 23, glucose 178, BUN 12, creatinine 0.99.  Lipase 41, AST 51, ALT 109.  Total bilirubin 0.8.  CRP 10.2, lactic acid 2.4.  Urinalysis unrevealing.  UDS positive for opiates.  CT abdomen/pelvis with interval worsening of known discitis/hospital-itis at T8-T9 with increased bony erosions and fragmentation of the endplates with increased paravertebral inflammatory changes and soft tissue density, moderate spinal canal stenosis, patchy atelectasis versus infiltrate lung bases, cholelithiasis, stable right adrenal adenoma, degenerative changes lumbar spine with severe spinal canal stenosis L2-3, L4-5.  MR T-spine with substantial progression T8-9 discitis/osteomyelitis since 3/17, eroded endplates with D34-534 loss of T8 vertebral body height since that time, probable septic facet joints, increased paraspinal phlegmon and can fluid epidural and dural thickening, increased spinal stenosis T8-9 spinal cord mass effect but with no cord edema.   ?  ?Seen by neurosurgery, recommend medical management, continue IV antibiotics.  Infectious disease consulted and recommending continuing IV Ceftriaxone until 06/27/21.  Hospital service consulted  for further evaluation and management of thoracic spine progressive discitis/osteomyelitis. ?  ?Given his Nausea and Vomiting, GI was consulted and offered EGD but patient refused and recommending Scheduled Zofran and PPI BID.Marland Kitchen Diet advanced to Soft Diet  and continue to monitor LFTs. LFTs silightly elevated and patient has not had a Bowel Movement since Saturday but refusing bowel regimen but was agreeable to taking the bisacodyl and subsequently agreed to a smog enema with some small results.  We will give him another smog enema and continue bowel regimen for now but he is continually is refusing his bowel regimen and refused second SMOG. ?  ?PT OT evaluated and recommended CIR but patient did not demonstrate the ability for intensive rehab required to admit to CIR so now recommendations for him go to SNF. Patient given SNF choices and he is interested in [facility] [facility] [place] [facility] reviewing patient clinicals and awaiting bed offer and [employer].    ? ? ?Assessment & Plan: ?  ?Principal Problem: ?  Thoracic discitis ?Active Problems: ?  Oral thrush ?  Poor dentition ?  Essential hypertension ?  Class 1 obesity due to excess calories with body mass index (BMI) of 34.0 to 34.9 in adult ?  Pressure injury of skin ?  Nausea and vomiting ?  Constipation ? ?#1 T8-9 discitis/osteomyelitis ?-Patient presented with progressive back pain. ?-Imaging done concerning for substantial progression of T8-9 discitis/osteomyelitis since 3/17, eroded endplates with D34-534 loss of T8 vertebral body height since that time, probable septic facet joint, increased paraspinal phlegmon and dural thickening, increased spinal stenosis T8-9 spinal cord mass effect but with no cord edema. ?-CRP noted to be elevated at 10.2, lactic acid at 2.5, WBC at 12.3 on admission. ?-  Patient assessed by neurosurgery who recommended medical management with IV antibiotics per ID directions and no surgical input at this time ?-Leukocytosis trended  down. ?-Patient underwent aspirate cultures done which showed no growth aerobically or anaerobically. ?-Blood cultures with no growth to date ?-Status post T8-9 disc space aspiration per IR 06/13/2021 with cultures with no growth. ?-Continue current pain regimen of ketorolac 15 mg every 8 hours x5 days. ?-Continue oxycodone as needed. ?-Continue IV Dilaudid as needed as needed for severe breakthrough pain. ?-Patient seen by ID recommending continuation of Rocephin 2 g daily until 06/27/2021, on patient now currently on oral cefadroxil for an additional 2 weeks per ID recommendations and subsequent consideration for reimaging sometime post antibiotic completion.  ?-Continue scheduled Tylenol 500 mg 3 times daily. ?-Continue oxycodone to 5 to 10 mg every 4 hours as needed breakthrough pain. ?-Continue Neurontin 300 mg twice daily. ?-Noted to have outpatient follow-up initially set up with Dr. Gale Journey however patient currently still hospitalized and will need to be rescheduled for outpatient follow-up with ID. ?-Patient seen by PT who recommended CIR but patient not a candidate and will need to go to SNF. ? ?2.  Intermittent nausea/vomiting ?-Patient noted to have some episodic nausea/vomiting seen in the ED several times. ?-CT abdomen and pelvis done negative for obstruction, no increased stool burden, small gallstones. ?-Concern for probable esophageal candidiasis with known recurrent oral thrush versus atypical presentation of gallstones. ?-GI was consulted offered EGD but patient declined. ?-GI recommending continuation of Zofran 4 mg p.o./IV every 8. ?-Continue Compazine as needed for refractory nausea and vomiting. ?-Nausea and vomiting improved. ?-Continue PPI. ?-Follow-up KUB done with no acute abnormalities. ?-Diet advanced however patient noted to be afraid of eating given queasiness which improved.  Patient noted to have tolerated diet today ?-IV antiemetics, supportive care. ?-Continue current bowel regimen. ? ?3.  transaminitis ?-Felt likely secondary to progressive infection from osteomyelitis versus medication side effects with Rocephin. ?-Acute hepatitis panel negative. ?-Transaminitis trending down. ?-Outpatient follow-up. ?-Follow. ? ?4.  Oral thrush ?-Patient due to refusal of oral medications intermittently was subsequently started on IV fluconazole 200 mg daily. ?-Nystatin swish and swallow. ? ?5.  Hypertension ?-Antihypertensive medications initially held due to soft blood pressure. ?-Was on IV Lopressor that has subsequently been discontinued and patient resumed back on home regimen of lisinopril/ HCTZ. ?-Blood pressure borderline this morning and as such we will hold antihypertensive medications today. ? ?6.  Hyponatremia ?-Likely secondary to hypovolemic hyponatremia. ?-Improved. ? ?7.  Constipation ?-Patient placed on a bowel regimen that he has been refusing. ?-Patient was placed on bisacodyl 5 mg daily as needed for moderate constipation and refused bisacodyl 10 mg x 1. ?-Smog enema was ordered and had a small bowel movement, repeat smog ordered and patient noted to have refused. ?-Patient noted to be refusing other laxatives and agreeable to Senokot 2 tabs twice daily which we will continue. ?-Continue MiraLAX twice daily. ?-Patient stated had a large bowel movement 06/26/2021 ?-Continue current bowel regimen. ?-Supportive care. ? ?8.  Bright red blood per rectum ?-Likely secondary to constipation and hemorrhoids. ?-H&H stable at 12.6. ?-Patient seen in consultation by GI earlier on during the hospitalization however refused EGD. ?-Outpatient follow-up. ? ?9.  Dental pain ?-Orthopantogram done with multiple missing teeth and dental caries and periapical lucencies. ?-Currently on antibiotics. ?-Outpatient follow-up with dentistry. ? ?10.  Hypoalbuminemia ?-Nutritional supplementation. ?-Dietitian consulted and following. ? ?11. physical deconditioning ?-Seen by PT/ OT who are currently recommending SNF. ?-CIR  initially  recommended however patient did not demonstrate the ability for intensive rehab. ?-SNF options given and patient prefers [facility] and currently SNF placement under review and insurance autho

## 2021-06-30 DIAGNOSIS — I10 Essential (primary) hypertension: Secondary | ICD-10-CM | POA: Diagnosis not present

## 2021-06-30 DIAGNOSIS — M4644 Discitis, unspecified, thoracic region: Secondary | ICD-10-CM | POA: Diagnosis not present

## 2021-06-30 DIAGNOSIS — M869 Osteomyelitis, unspecified: Secondary | ICD-10-CM | POA: Diagnosis not present

## 2021-06-30 DIAGNOSIS — E6609 Other obesity due to excess calories: Secondary | ICD-10-CM | POA: Diagnosis not present

## 2021-06-30 LAB — BASIC METABOLIC PANEL
Anion gap: 8 (ref 5–15)
BUN: 10 mg/dL (ref 6–20)
CO2: 26 mmol/L (ref 22–32)
Calcium: 8.7 mg/dL — ABNORMAL LOW (ref 8.9–10.3)
Chloride: 101 mmol/L (ref 98–111)
Creatinine, Ser: 0.97 mg/dL (ref 0.61–1.24)
GFR, Estimated: >60 mL/min (ref >60)
Glucose, Bld: 96 mg/dL (ref 70–99)
Potassium: 3.5 mmol/L (ref 3.5–5.1)
Sodium: 135 mmol/L (ref 135–145)

## 2021-06-30 NOTE — Progress Notes (Signed)
PROGRESS NOTE    Michael [name]  UYQ:[id number] DOB: [birth year] DOA: 06/11/2021 PCP: [name], PA    Chief Complaint  Patient presents with   Back Pain    Brief Narrative:  Michael [name] is a 61 year old male with past medical history significant for essential hypertension, obesity who presented to [facility] ED on 4/12 with progressive back pain. Patient also reports persistent nausea/vomiting; and has been seen at the [facility], ED on multiple occasions for such complaint.  Patient recently hospitalized from 3/17 - 3/22 with findings of T8-9 discitis and E. coli bacteremia following recent dental infection.  Patient was discharged on Ancef to continue through 06/27/2021.   In the ED, WBC count 12.3, hemoglobin 16.2, platelets 309.  Sodium 133, potassium 3.2, chloride 98, CO2 23, glucose 178, BUN 12, creatinine 0.99.  Lipase 41, AST 51, ALT 109.  Total bilirubin 0.8.  CRP 10.2, lactic acid 2.4.  Urinalysis unrevealing.  UDS positive for opiates.  CT abdomen/pelvis with interval worsening of known discitis/hospital-itis at T8-T9 with increased bony erosions and fragmentation of the endplates with increased paravertebral inflammatory changes and soft tissue density, moderate spinal canal stenosis, patchy atelectasis versus infiltrate lung bases, cholelithiasis, stable right adrenal adenoma, degenerative changes lumbar spine with severe spinal canal stenosis L2-3, L4-5.  MR T-spine with substantial progression T8-9 discitis/osteomyelitis since 3/17, eroded endplates with 25% loss of T8 vertebral body height since that time, probable septic facet joints, increased paraspinal phlegmon and can fluid epidural and dural thickening, increased spinal stenosis T8-9 spinal cord mass effect but with no cord edema.     Seen by neurosurgery, recommend medical management, continue IV antibiotics.  Infectious disease consulted and recommending continuing IV Ceftriaxone until 06/27/21.  Hospital service consulted  for further evaluation and management of thoracic spine progressive discitis/osteomyelitis.   Given his Nausea and Vomiting, GI was consulted and offered EGD but patient refused and recommending Scheduled Zofran and PPI BID.Marland Kitchen Diet advanced to Soft Diet  and continue to monitor LFTs. LFTs silightly elevated and patient has not had a Bowel Movement since Saturday but refusing bowel regimen but was agreeable to taking the bisacodyl and subsequently agreed to a smog enema with some small results.  We will give him another smog enema and continue bowel regimen for now but he is continually is refusing his bowel regimen and refused second SMOG.   PT OT evaluated and recommended CIR but patient did not demonstrate the ability for intensive rehab required to admit to CIR so now recommendations for him go to SNF. Patient given SNF choices and [facility] clinicals and awaiting bed offer and [facility].      Assessment & Plan:   Principal Problem:   Thoracic discitis Active Problems:   Oral thrush   Poor dentition   Essential hypertension   Class 1 obesity due to excess calories with body mass index (BMI) of 34.0 to 34.9 in adult   Pressure injury of skin   Nausea and vomiting   Constipation  #1 T8-9 discitis/osteomyelitis -Patient presented with progressive back pain. -Imaging done concerning for substantial progression of T8-9 discitis/osteomyelitis since 3/17, eroded endplates with 25% loss of T8 vertebral body height since that time, probable septic facet joint, increased paraspinal phlegmon and dural thickening, increased spinal stenosis T8-9 spinal cord mass effect but with no cord edema. -CRP noted to be elevated at 10.2, lactic acid at 2.5, WBC at 12.3 on admission. -  Patient assessed by neurosurgery who recommended medical management with IV antibiotics per ID directions and no surgical input at this time -Leukocytosis trended  down. -Patient underwent aspirate cultures done which showed no growth aerobically or anaerobically. -Blood cultures with no growth to date -Status post T8-9 disc space aspiration per IR 06/13/2021 with cultures with no growth. -Continue current pain regimen of ketorolac 15 mg every 8 hours x5 days. -Continue oxycodone as needed. -Continue IV Dilaudid as needed as needed for severe breakthrough pain. -Patient seen by ID recommending continuation of Rocephin 2 g daily until 06/27/2021, on patient now currently on oral cefadroxil for an additional 2 weeks per ID recommendations and subsequent consideration for reimaging sometime post antibiotic completion.  -Continue scheduled Tylenol 500 mg 3 times daily. -Continue oxycodone to 5 to 10 mg every 4 hours as needed breakthrough pain. -Continue Neurontin 300 mg twice daily. -Noted to have outpatient follow-up initially set up with Dr. Renold Don however patient currently still hospitalized and will need to be rescheduled for outpatient follow-up with ID. -Patient seen by PT who recommended CIR but patient not a candidate and will need to go to SNF.  2.  Intermittent nausea/vomiting -Patient noted to have some episodic nausea/vomiting seen in the ED several times. -CT abdomen and pelvis done negative for obstruction, no increased stool burden, small gallstones. -Concern for probable esophageal candidiasis with known recurrent oral thrush versus atypical presentation of gallstones. -GI was consulted offered EGD but patient declined. -GI recommending continuation of Zofran 4 mg p.o./IV every 8. -Continue Compazine as needed for refractory nausea and vomiting. -Nausea and vomiting improved. -Continue PPI. -Follow-up KUB done with no acute abnormalities. -Diet advanced however patient noted to be afraid of eating given queasiness which improved.  Patient noted to have tolerated diet yesterday -IV antiemetics, supportive care. -Continue current bowel  regimen.  3. transaminitis -Felt likely secondary to progressive infection from osteomyelitis versus medication side effects with Rocephin. -Acute hepatitis panel negative. -Transaminitis trending down. -Outpatient follow-up. -Follow.  4.  Oral thrush -Patient due to refusal of oral medications intermittently was subsequently started on IV fluconazole 200 mg daily. -Nystatin swish and swallow.  5.  Hypertension -Antihypertensive medications initially held due to soft blood pressure. -Was on IV Lopressor that has subsequently been discontinued and patient resumed back on home regimen of lisinopril/ HCTZ. -Blood pressure borderline the morning of 06/29/2021 however has improved today.   -Resume home regimen antihypertensive medications of lisinopril/HCTZ.    6.  Hyponatremia -Likely secondary to hypovolemic hyponatremia. -Improved.  7.  Constipation -Patient placed on a bowel regimen that he has been refusing. -Patient was placed on bisacodyl 5 mg daily as needed for moderate constipation and refused bisacodyl 10 mg x 1. -Smog enema was ordered and had a small bowel movement, repeat smog ordered and patient noted to have refused. -Patient noted to be refusing other laxatives and agreeable to Senokot 2 tabs twice daily which we will continue. -Continue MiraLAX twice daily. -Patient stated had a large bowel movement 06/26/2021 -Continue current bowel regimen. -Supportive care.  8.  Bright red blood per rectum -Likely secondary to constipation and hemorrhoids. -H&H stable at 12.6. -Patient seen in consultation by GI earlier on during the hospitalization however refused EGD. -Outpatient follow-up.  9.  Dental pain -Orthopantogram done with multiple missing teeth and dental caries and periapical lucencies. -Currently on antibiotics. -Outpatient follow-up with dentistry.  10.  Hypoalbuminemia -Nutritional supplementation. -Dietitian consulted and following.  11. physical  deconditioning -Seen by PT/  OT who are currently recommending SNF. -CIR initially recommended however patient did not demonstrate the ability for intensive rehab. -SNF options given and patient prefers [facility] and currently SNF placement under review and insurance authorization pending.  12.  Bilateral vision blurriness, stable -Patient with some complaints of bilateral visual blurriness, does not wear glasses however noted to have reading glasses. -CT head done negative. -Outpatient follow-up with ophthalmology.  13.  Obesity -Weight loss and dietary counseling given, patient noted need for aggressive lifestyle changes/weight loss as it complicates all facets of his care. -Outpatient follow-up with PCP.  14.  Hypokalemia -Repleted. -Potassium at 3.5  15.??  Depression -Psychiatry consulted and assessed the patient. -No medications recommended by psychiatry at this time.   DVT prophylaxis: SCDs Code Status: Full Family Communication: Updated patient.  No family at bedside. Disposition: SNF  Status is: Inpatient Remains inpatient appropriate because: Severity of illness   Consultants:  Infectious disease: Dr. Thedore Mins 06/12/2021 Neurosurgery: Dr. Jake Samples 06/12/2021 Gastroenterology: Dr. Tomasa Rand 06/14/2021 Psychiatry 06/26/2021  Procedures:  Procedure: T8/T9 disc space aspirate, T9 superior endplate biopsy    Findings:  1. Successful T8/T9 disc space aspirate with 3ml bloody material aspirated  2. Successful T9 superior endplate biopsy x2 passes with minimal bony material able to be sampled     CT head without contrast 06/21/2021 CT abdomen and pelvis 06/12/2021 Abdominal x-rays 06/22/2021, 06/15/2021 Orthopantogram 06/12/2021 MRI T-spine 06/12/2021  Antimicrobials:  Anti-infectives (From admission, onward)    Start     Dose/Rate Route Frequency Ordered Stop   06/28/21 1000  cefadroxil (DURICEF) capsule 1,000 mg        1,000 mg Oral 2 times daily 06/24/21 1341  07/12/21 0959   06/19/21 1100  fluconazole (DIFLUCAN) IVPB 200 mg        200 mg 100 mL/hr over 60 Minutes Intravenous Daily 06/19/21 0942 07/03/21 0959   06/12/21 1000  cefTRIAXone (ROCEPHIN) 2 g in sodium chloride 0.9 % 100 mL IVPB        2 g 200 mL/hr over 30 Minutes Intravenous Every 24 hours 06/12/21 0901 06/27/21 0930         Subjective: Laying in bed.  No chest pain.  No shortness of breath.   Complaining of back pain.   Objective: Vitals:   06/30/21 0314 06/30/21 0352 06/30/21 0807 06/30/21 1144  BP: 97/78  138/86 (!) 118/91  Pulse: 93  (!) 102 (!) 101  Resp: 17  18 20   Temp: 98.9 F (37.2 C)  98.2 F (36.8 C) 98.6 F (37 C)  TempSrc: Oral  Oral Oral  SpO2: 97%  99% 96%  Weight:  127.4 kg    Height:        Intake/Output Summary (Last 24 hours) at 06/30/2021 1307 Last data filed at 06/30/2021 0900 Gross per 24 hour  Intake 690 ml  Output 900 ml  Net -210 ml    Filed Weights   06/11/21 2219 06/27/21 0939 06/30/21 0352  Weight: 113.4 kg 121.4 kg 127.4 kg    Examination:  General exam: NAD Respiratory system: CTA B anterior lung fields.  No wheezes, no crackles, no rhonchi.  Normal respiratory effort.  Cardiovascular system: Regular rate rhythm no murmurs rubs or gallops.  No JVD.  No lower extremity edema.  Gastrointestinal system: Abdomen obese, soft, nontender, nondistended, positive bowel sounds.  No rebound.  No guarding.  Central nervous system: Alert and oriented. No focal neurological deficits. Extremities: Symmetric 5 x 5 power. Skin: No rashes, lesions or  ulcers Psychiatry: Judgement and insight appear fair. Mood & affect flat, somewhat depressed.     Data Reviewed: I have personally reviewed following labs and imaging studies  CBC: Recent Labs  Lab 06/24/21 0432 06/25/21 0500 06/26/21 0540 06/27/21 0653  WBC 7.2 5.8 6.7 8.0  NEUTROABS 3.7 2.6 3.6 4.1  HGB 13.7 13.6 12.8* 12.6*  HCT 40.2 41.6 38.6* 38.0*  MCV 86.8 88.7 88.9 88.8  PLT  209 224 209 214     Basic Metabolic Panel: Recent Labs  Lab 06/24/21 0432 06/25/21 0500 06/26/21 0540 06/27/21 0653 06/28/21 0500 06/30/21 0330  NA 130* 134* 134* 133* 135 135  K 3.7 3.7 3.3* 3.4* 3.9 3.5  CL 97* 102 100 100 103 101  CO2 24 26 24 25 25 26   GLUCOSE 111* 103* 181* 111* 109* 96  BUN 12 13 13 11 10 10   CREATININE 0.84 0.93 1.00 1.01 1.01 0.97  CALCIUM 8.6* 8.7* 8.7* 8.8* 9.0 8.7*  MG 2.2 2.2 1.9 1.9  --   --   PHOS 2.8 2.9  --   --   --   --      GFR: Estimated Creatinine Clearance: 111.5 mL/min (by C-G formula based on SCr of 0.97 mg/dL).  Liver Function Tests: Recent Labs  Lab 06/24/21 0432 06/25/21 0500 06/26/21 0540  AST 29 39 33  ALT 63* 74* 64*  ALKPHOS 94 100 99  BILITOT 0.5 0.5 0.4  PROT 6.8 6.9 6.3*  ALBUMIN 2.6* 2.5* 2.4*     CBG: No results for input(s): GLUCAP in the last 168 hours.   No results found for this or any previous visit (from the past 240 hour(s)).       Radiology Studies: No results found.      Scheduled Meds:  acetaminophen  500 mg Oral TID   cefadroxil  1,000 mg Oral BID   Chlorhexidine Gluconate Cloth  6 each Topical Daily   feeding supplement  237 mL Oral BID BM   gabapentin  300 mg Oral BID   lisinopril  5 mg Oral Daily   And   hydrochlorothiazide  6.25 mg Oral Daily   nystatin  5 mL Mouth/Throat BID   ondansetron  4 mg Oral Q8H   Or   ondansetron (ZOFRAN) IV  4 mg Intravenous Q8H   pantoprazole (PROTONIX) IV  40 mg Intravenous Q12H   polyethylene glycol  17 g Oral BID   senna-docusate  2 tablet Oral BID   simethicone  160 mg Oral TID PC & HS   sodium chloride flush  10-40 mL Intracatheter Q12H   sodium chloride flush  3 mL Intravenous Q12H   Continuous Infusions:  fluconazole (DIFLUCAN) IV 200 mg (06/30/21 0825)     LOS: 18 days    Time spent: 35 minutes    Ramiro Harvest, MD [facility]   To contact the attending provider between 7A-7P or the covering provider during  after hours 7P-7A, please log into the web site [email or web address] and access using universal [place] password for that web site. If you do not have the password, please call the hospital operator.  06/30/2021, 1:07 PM

## 2021-06-30 NOTE — Progress Notes (Signed)
Physical Therapy Treatment ?Patient Details ?Name: Michael [name] ?MRN: [id number] ?DOB: March 16, 1960 ?Today's Date: 06/30/2021 ? ? ?History of Present Illness Michael [name] is a 61 y.o. male who presented to ED c/o back pain x 2 weeks. Pt was admitted for T8-T9 osteomyelitis. PMH: HTN, obesity, GERD ? ?  ?PT Comments  ? ? Pt continues to need maximal encouragement to participate and is very self limiting when he does participate. Reporting he is in excruciating pain but then moving with no grimace or halting to movement. Pt continues to be min-modA for bed mobility, min Ax2 for transfers and sidestepping. D/c plans remain appropriate at this time. Hopeful for discharge to [facility] tomorrow. ?  ?Recommendations for follow up therapy are one component of a multi-disciplinary discharge planning process, led by the attending physician.  Recommendations may be updated based on patient status, additional functional criteria and insurance authorization. ? ?Follow Up Recommendations ? Skilled nursing-short term rehab (<3 hours/day) ?  ?  ?Assistance Recommended at Discharge Frequent or constant Supervision/Assistance  ?Patient can return home with the following A little help with walking and/or transfers;A lot of help with bathing/dressing/bathroom;Assistance with cooking/housework;Assist for transportation;Help with stairs or ramp for entrance;Other (comment) ?  ?Equipment Recommendations ? None recommended by PT  ?  ?Recommendations for Other Services   ? ? ?  ?Precautions / Restrictions Precautions ?Precautions: Fall ?Precaution Comments: morbid obesity, extremely self limiting ?Restrictions ?Other Position/Activity Restrictions: self limiting due to back pain  ?  ? ?Mobility ? Bed Mobility ?Overal bed mobility: Needs Assistance ?Bed Mobility: Supine to Sit, Sit to Supine ?  ?  ?Supine to sit: HOB elevated, Mod assist, Min guard ?Sit to supine: Mod assist ?  ?General bed mobility comments: min guard and heavy use of  rail to come to the EoB but no physical assist, modA for bringing LE back into the bed ?  ? ?Transfers ?Overall transfer level: Needs assistance ?Equipment used: Rolling walker (2 wheels) ?Transfers: Sit to/from Stand ?Sit to Stand: +2 safety/equipment, Min assist, From elevated surface ?  ?  ?  ?  ?  ?General transfer comment: minAx2 for power up and steadying with RW ?  ? ?Ambulation/Gait ?Ambulation/Gait assistance: Min assist, +2 safety/equipment ?Gait Distance (Feet): 2 Feet ?Assistive device: Rolling walker (2 wheels) ?  ?Gait velocity: dec ?Gait velocity interpretation: <1.31 ft/sec, indicative of household ambulator ?  ?General Gait Details: 2-3 sidesteps R with RW for improved positioning in bed prior to return to supine. Completeded easily and with max encouragement able to step 2-3 steps back to L and then back to R, pt refused any steps forward. ? ? ? ? ?  ?Balance Overall balance assessment: Needs assistance ?Sitting-balance support: Feet supported, No upper extremity supported ?Sitting balance-Leahy Scale: Good ?  ?  ?Standing balance support: Bilateral upper extremity supported, During functional activity, Reliant on assistive device for balance ?Standing balance-Leahy Scale: Poor ?  ?  ?  ?  ?  ?  ?  ?  ?  ?  ?  ?  ?  ? ?  ?Cognition Arousal/Alertness: Awake/alert ?Behavior During Therapy: Flat affect ?Overall Cognitive Status: Within Functional Limits for tasks assessed ?  ?  ?  ?  ?  ?  ?  ?  ?  ?  ?  ?  ?  ?  ?  ?  ?General Comments: continues to be very self limiting despite minimal amount of physical assistance needed. Requires maximal verbal encouragement ?  ?  ? ?  ?   ?  General Comments General comments (skin integrity, edema, etc.): VSS on RA, requesting pain medication RN notified ?  ?  ? ?Pertinent Vitals/Pain Pain Assessment ?Pain Assessment: Faces ?Faces Pain Scale: Hurts little more ?Pain Location: back ?Pain Descriptors / Indicators: Grimacing, Guarding, Discomfort ?Pain  Intervention(s): Monitored during session, Patient requesting pain meds-RN notified  ? ? ? ?PT Goals (current goals can now be found in the care plan section) Acute Rehab PT Goals ?Patient Stated Goal: feel better ?PT Goal Formulation: With patient ?Time For Goal Achievement: 07/08/21 ?Potential to Achieve Goals: Good ?Progress towards PT goals: Progressing toward goals (very slowed) ? ?  ?Frequency ? ? ? Min 3X/week ? ? ? ?  ?PT Plan Current plan remains appropriate  ? ? ?   ?AM-PAC PT "6 Clicks" Mobility   ?Outcome Measure ? Help needed turning from your back to your side while in a flat bed without using bedrails?: A Little ?Help needed moving from lying on your back to sitting on the side of a flat bed without using bedrails?: A Little ?Help needed moving to and from a bed to a chair (including a wheelchair)?: A Little ?Help needed standing up from a chair using your arms (e.g., wheelchair or bedside chair)?: A Little ?Help needed to walk in hospital room?: A Lot ?Help needed climbing 3-5 steps with a railing? : Total ?6 Click Score: 15 ? ?  ?End of Session Equipment Utilized During Treatment: Gait belt ?Activity Tolerance: Patient tolerated treatment well;Other (comment) (continues to be self limiting) ?Patient left: in bed;with call bell/phone within reach;with family/visitor present ?Nurse Communication: Mobility status ?PT Visit Diagnosis: Unsteadiness on feet (R26.81);Repeated falls (R29.6);Difficulty in walking, not elsewhere classified (R26.2) ?  ? ? ?Time: [id number] ?PT Time Calculation (min) (ACUTE ONLY): 20 min ? ?Charges:  $Therapeutic Activity: 8-22 mins          ?          ? ?Dietrick Barris B. Migdalia Dk PT, DPT ?Acute Rehabilitation Services ?Please use secure chat or  ?Call Office [phone] ? ? ? ?[place] ?06/30/2021, 3:51 PM ? ?

## 2021-06-30 NOTE — TOC Progression Note (Signed)
Transition of Care (TOC) - Progression Note  ? ? ?Patient Details  ?Name: Michael [name] ?MRN: [id number] ?Date of Birth: Jan 18, 1961 ? ?Transition of Care (TOC) CM/SW Contact  ?Geralynn Ochs, LCSW ?Phone Number: ?06/30/2021, 4:01 PM ? ?Clinical Narrative:   CSW has attempted to contact [facility] multiple times today, unable to get anyone on the phone in Admissions. CSW met with patient to discuss choosing a different SNF, and patient is in agreement with [facility]. CSW confirmed with [facility] that they have a male bed and can initiate insurance authorization once new PT/OT notes are complete. CSW to follow. ? ? ? ?Expected Discharge Plan: [place] ?Barriers to Discharge: Insurance Authorization ? ?Expected Discharge Plan and Services ?Expected Discharge Plan: [place] ?  ?Discharge Planning Services: CM Consult ?  ?Living arrangements for the past 2 months: [place] ?                ?  ?  ?  ?  ?  ?HH Arranged: RN ?[place] Agency:  [facility]) ?  ?  ?  ? ? ?Social Determinants of Health (SDOH) Interventions ?  ? ?Readmission Risk Interventions ?   ? View : No data to display.  ?  ?  ?  ? ? ?

## 2021-07-01 LAB — BASIC METABOLIC PANEL
Anion gap: 7 (ref 5–15)
BUN: 7 mg/dL (ref 6–20)
CO2: 26 mmol/L (ref 22–32)
Calcium: 8.8 mg/dL — ABNORMAL LOW (ref 8.9–10.3)
Chloride: 102 mmol/L (ref 98–111)
Creatinine, Ser: 0.97 mg/dL (ref 0.61–1.24)
GFR, Estimated: >60 mL/min (ref >60)
Glucose, Bld: 100 mg/dL — ABNORMAL HIGH (ref 70–99)
Potassium: 3.6 mmol/L (ref 3.5–5.1)
Sodium: 135 mmol/L (ref 135–145)

## 2021-07-01 MED ORDER — LISINOPRIL 2.5 MG PO TABS
2.5000 mg | ORAL_TABLET | Freq: Every day | ORAL | Status: DC
  Administered 2021-07-02: 2.5 mg via ORAL
  Filled 2021-07-01: qty 1

## 2021-07-01 MED ORDER — HYDROCHLOROTHIAZIDE 12.5 MG PO TABS
6.2500 mg | ORAL_TABLET | Freq: Every day | ORAL | Status: DC
  Administered 2021-07-02: 6.25 mg via ORAL
  Filled 2021-07-01: qty 1

## 2021-07-01 NOTE — Progress Notes (Signed)
Occupational Therapy Treatment ?Patient Details ?Name: Michael [name] ?MRN: [id number] ?DOB: [birth year] ?Today's Date: 07/01/2021 ? ? ?History of present illness Michael [name] is a 61 y.o. male who presented to ED c/o back pain x 2 weeks. Pt was admitted for T8-T9 osteomyelitis. PMH: HTN, obesity, GERD ?  ?OT comments ? Pt making slow progress towards goals this session, able to perform bed mobility with min A and bed level exercises with supervision during session. Pt declining OOB this session despite max encouragement, states it will be "excruciatingly painful" and is uncomfortable due to abdominal bloating. Educated pt/parent on importance of mobility and encouraged pt to mobilize to prevent weakness. Pt verbalized understanding. Will follow acutely. Continue to recommend SNF at d/c.  ? ?Recommendations for follow up therapy are one component of a multi-disciplinary discharge planning process, led by the attending physician.  Recommendations may be updated based on patient status, additional functional criteria and insurance authorization. ?   ?Follow Up Recommendations ? Skilled nursing-short term rehab (<3 hours/day)  ?  ?Assistance Recommended at Discharge Frequent or constant Supervision/Assistance  ?Patient can return home with the following ? A lot of help with walking and/or transfers;A lot of help with bathing/dressing/bathroom;Assistance with cooking/housework;Assist for transportation;Help with stairs or ramp for entrance ?  ?Equipment Recommendations ? BSC/3in1  ?  ?Recommendations for Other Services   ? ?  ?Precautions / Restrictions Precautions ?Precautions: Fall ?Precaution Comments: morbid obesity, extremely self limiting ?Restrictions ?Weight Bearing Restrictions: No ?Other Position/Activity Restrictions: self limiting due to back pain  ? ? ?  ? ?Mobility Bed Mobility ?Overal bed mobility: Needs Assistance ?Bed Mobility: Rolling ?Rolling: Min assist ?  ?  ?  ?  ?General bed mobility comments: min  A to roll to R side, increased time/cuing to scoot self upward toward [facility] ?  ? ?Transfers ?  ?  ?  ?  ?  ?  ?  ?  ?  ?General transfer comment: pt declining despite max encouragement ?  ?  ?Balance   ?  ?  ?  ?  ?  ?  ?  ?  ?  ?  ?  ?  ?  ?  ?  ?  ?  ?  ?   ? ?ADL either performed or assessed with clinical judgement  ? ?ADL Overall ADL's : Needs assistance/impaired ?  ?  ?  ?  ?Upper Body Bathing: Maximal assistance;Bed level ?Upper Body Bathing Details (indicate cue type and reason): rolling to L side for UB bathing ?  ?  ?  ?  ?  ?  ?  ?  ?  ?  ?  ?  ?  ?General ADL Comments: limited to bed level, pt declining OOB mobility ?  ? ?Extremity/Trunk Assessment Upper Extremity Assessment ?Upper Extremity Assessment: Generalized weakness ?  ?Lower Extremity Assessment ?Lower Extremity Assessment: Defer to PT evaluation ?  ?  ?  ? ?Vision   ?Vision Assessment?: No apparent visual deficits ?  ?Perception Perception ?Perception: Not tested ?  ?Praxis Praxis ?Praxis: Not tested ?  ? ?Cognition Arousal/Alertness: Awake/alert ?Behavior During Therapy: Flat affect ?Overall Cognitive Status: Within Functional Limits for tasks assessed ?  ?  ?  ?  ?  ?  ?  ?  ?  ?  ?  ?  ?  ?  ?  ?  ?General Comments: self limiting, however requires min A for bed level exercise during session ?  ?  ?   ?Exercises  Exercises: General Upper Extremity, General Lower Extremity, Other exercises ?General Exercises - Upper Extremity ?Shoulder Flexion: Both, 10 reps, Supine ?General Exercises - Lower Extremity ?Ankle Circles/Pumps: AROM, Both, 10 reps, Supine ?Long Arc Quad: AROM, 10 reps, Both, Supine ?Hip Flexion/Marching: Both, 10 reps, Supine ?Other Exercises ?Other Exercises: rolling R/L x5 each side ? ?  ?Shoulder Instructions   ? ? ?  ?General Comments VSS on RA, mother present and encouraging during session  ? ? ?Pertinent Vitals/ Pain       Pain Assessment ?Pain Assessment: Faces ?Pain Score: 5  ?Faces Pain Scale: Hurts even more ?Pain Location:  back & abdominal discomfort ?Pain Descriptors / Indicators: Grimacing, Guarding, Discomfort ?Pain Intervention(s): Limited activity within patient's tolerance, Monitored during session, Repositioned ? ?Home Living   ?  ?  ?  ?  ?  ?  ?  ?  ?  ?  ?  ?  ?  ?  ?  ?  ?  ?  ? ?  ?Prior Functioning/Environment    ?  ?  ?  ?   ? ?Frequency ? Min 2X/week  ? ? ? ? ?  ?Progress Toward Goals ? ?OT Goals(current goals can now be found in the care plan section) ? Progress towards OT goals: Not progressing toward goals - comment ? ?Acute Rehab OT Goals ?Patient Stated Goal: to use the bathroom ?OT Goal Formulation: With patient ?Time For Goal Achievement: 07/02/21 ?Potential to Achieve Goals: Fair ?ADL Goals ?Pt Will Perform Grooming: with modified independence;standing ?Pt Will Perform Lower Body Bathing: with modified independence;sitting/lateral leans;sit to/from stand;with adaptive equipment ?Pt Will Perform Lower Body Dressing: with modified independence;with adaptive equipment;sitting/lateral leans;sit to/from stand ?Pt Will Transfer to Toilet: with modified independence;stand pivot transfer ?Pt Will Perform Toileting - Clothing Manipulation and hygiene: with modified independence;sitting/lateral leans;sit to/from stand  ?Plan Discharge plan remains appropriate;Frequency remains appropriate   ? ?Co-evaluation ? ? ?   ?  ?  ?  ?  ? ?  ?AM-PAC OT "6 Clicks" Daily Activity     ?Outcome Measure ? ? Help from another person eating meals?: None ?Help from another person taking care of personal grooming?: A Little ?Help from another person toileting, which includes using toliet, bedpan, or urinal?: A Lot ?Help from another person bathing (including washing, rinsing, drying)?: A Lot ?Help from another person to put on and taking off regular upper body clothing?: A Little ?Help from another person to put on and taking off regular lower body clothing?: A Lot ?6 Click Score: 16 ? ?  ?End of Session   ? ?OT Visit Diagnosis:  Unsteadiness on feet (R26.81);Other abnormalities of gait and mobility (R26.89);Muscle weakness (generalized) (M62.81) ?  ?Activity Tolerance Other (comment) (pt is self limiting) ?  ?Patient Left in bed;with call bell/phone within reach;with bed alarm set;with family/visitor present ?  ?Nurse Communication Mobility status ?  ? ?   ? ?Time: 4782-9562 ?OT Time Calculation (min): 33 min ? ?Charges: OT General Charges ?$OT Visit: 1 Visit ?OT Treatments ?$Therapeutic Activity: 23-37 mins ? ?Alfonzo Beers, OTD, OTR/L ?Acute Rehab ?[phone] ? ? ? ?Mayer Masker ?07/01/2021, 3:56 PM ?

## 2021-07-01 NOTE — Progress Notes (Signed)
?PROGRESS NOTE ? ? ? ?Michael [name]  [id number] DOB: Jul 14, 1960 DOA: 06/11/2021 ?PCP: Lavella Lemons, PA  ? ? ?Chief Complaint  ?Patient presents with  ? Back Pain  ? ? ?Brief Narrative:  ?Michael [name] is a 61 year old male with past medical history significant for essential hypertension, obesity who presented to [facility] ED on 4/12 with progressive back pain. Patient also reports persistent nausea/vomiting; and has been seen at the [facility] ED on multiple occasions for such complaint.  Patient recently hospitalized from 3/17 - 3/22 with findings of T8-9 discitis and E. coli bacteremia following recent dental infection.  Patient was discharged on Ancef to continue through 06/27/2021. ?  ?In the ED, WBC count 12.3, hemoglobin 16.2, platelets 309.  Sodium 133, potassium 3.2, chloride 98, CO2 23, glucose 178, BUN 12, creatinine 0.99.  Lipase 41, AST 51, ALT 109.  Total bilirubin 0.8.  CRP 10.2, lactic acid 2.4.  Urinalysis unrevealing.  UDS positive for opiates.  CT abdomen/pelvis with interval worsening of known discitis/hospital-itis at T8-T9 with increased bony erosions and fragmentation of the endplates with increased paravertebral inflammatory changes and soft tissue density, moderate spinal canal stenosis, patchy atelectasis versus infiltrate lung bases, cholelithiasis, stable right adrenal adenoma, degenerative changes lumbar spine with severe spinal canal stenosis L2-3, L4-5.  MR T-spine with substantial progression T8-9 discitis/osteomyelitis since 3/17, eroded endplates with D34-534 loss of T8 vertebral body height since that time, probable septic facet joints, increased paraspinal phlegmon and can fluid epidural and dural thickening, increased spinal stenosis T8-9 spinal cord mass effect but with no cord edema.   ?  ?Seen by neurosurgery, recommend medical management, continue IV antibiotics.  Infectious disease consulted and recommending continuing IV Ceftriaxone until 06/27/21.  Hospital service consulted  for further evaluation and management of thoracic spine progressive discitis/osteomyelitis. ?  ?Given his Nausea and Vomiting, GI was consulted and offered EGD but patient refused and recommending Scheduled Zofran and PPI BID.Marland Kitchen Diet advanced to Soft Diet  and continue to monitor LFTs. LFTs silightly elevated and patient has not had a Bowel Movement since Saturday but refusing bowel regimen but was agreeable to taking the bisacodyl and subsequently agreed to a smog enema with some small results.  We will give him another smog enema and continue bowel regimen for now but he is continually is refusing his bowel regimen and refused second SMOG. ?  ?PT OT evaluated and recommended CIR but patient did not demonstrate the ability for intensive rehab required to admit to CIR so now recommendations for him go to SNF. Patient given SNF choices and he is [facility] patient clinicals and awaiting bed offer and [employer].    ? ? ?Assessment & Plan: ?  ?Principal Problem: ?  Thoracic discitis ?Active Problems: ?  Oral thrush ?  Poor dentition ?  Essential hypertension ?  Class 1 obesity due to excess calories with body mass index (BMI) of 34.0 to 34.9 in adult ?  Pressure injury of skin ?  Nausea and vomiting ?  Constipation ? ?#1 T8-9 discitis/osteomyelitis ?-Patient presented with progressive back pain. ?-Imaging done concerning for substantial progression of T8-9 discitis/osteomyelitis since 3/17, eroded endplates with D34-534 loss of T8 vertebral body height since that time, probable septic facet joint, increased paraspinal phlegmon and dural thickening, increased spinal stenosis T8-9 spinal cord mass effect but with no cord edema. ?-CRP noted to be elevated at 10.2, lactic acid at 2.5, WBC at 12.3 on admission. ?-  Patient assessed by neurosurgery who recommended medical management with IV antibiotics per ID directions and no surgical input at this time ?-Leukocytosis trended  down. ?-Patient underwent aspirate cultures done which showed no growth aerobically or anaerobically. ?-Blood cultures with no growth to date ?-Status post T8-9 disc space aspiration per IR 06/13/2021 with cultures with no growth. ?-Continue current pain regimen of ketorolac 15 mg every 8 hours x5 days. ?-Continue oxycodone as needed. ?-Continue IV Dilaudid as needed as needed for severe breakthrough pain. ?-Patient seen by ID recommending continuation of Rocephin 2 g daily until 06/27/2021,  patient now currently on oral cefadroxil for an additional 2 weeks per ID recommendations and subsequent consideration for reimaging sometime post antibiotic completion.  ?-Continue scheduled Tylenol 500 mg 3 times daily.(Patient noted to be refusing) ?-Continue oxycodone to 5 to 10 mg every 4 hours as needed breakthrough pain. ?-Continue Neurontin 300 mg twice daily. ?-Noted to have outpatient follow-up initially set up with Dr. Renold Don however patient currently still hospitalized and will need to be rescheduled for outpatient follow-up with ID. ?-Patient seen by PT who recommended CIR but patient not a candidate and will need to go to SNF. ?-Will need to be rescheduled for outpatient follow-up with ID once patient is discharged. ? ?2.  Intermittent nausea/vomiting ?-Patient noted to have some episodic nausea/vomiting seen in the ED several times. ?-CT abdomen and pelvis done negative for obstruction, no increased stool burden, small gallstones. ?-Concern for probable esophageal candidiasis with known recurrent oral thrush versus atypical presentation of gallstones. ?-GI was consulted offered EGD but patient declined. ?-GI recommending continuation of Zofran 4 mg p.o./IV every 8. ?-Continue Compazine as needed for refractory nausea and vomiting. ?-Nausea and vomiting improved. ?-Continue PPI. ?-Follow-up KUB done with no acute abnormalities. ?-Diet advanced however patient noted to be afraid of eating given queasiness which improved.   Patient noted to have tolerated diet when his mother is there to encourage p.o. intake. ?-IV antiemetics, supportive care. ?-Continue current bowel regimen. ? ?3. transaminitis ?-Felt likely secondary to progressive infection from osteomyelitis versus medication side effects with Rocephin. ?-Acute hepatitis panel negative. ?-Transaminitis trending down. ?-Outpatient follow-up. ?-Follow. ? ?4.  Oral thrush ?-Patient due to refusal of oral medications intermittently was subsequently started on IV fluconazole 200 mg daily. ?-Nystatin swish and swallow. ? ?5.  Hypertension ?-Antihypertensive medications initially held due to soft blood pressure. ?-Was on IV Lopressor that has subsequently been discontinued and patient resumed back on home regimen of lisinopril/ HCTZ. ?-Blood pressure borderline the morning of 06/29/2021 however improved.   ?-Decrease lisinopril to 2.5 mg daily and continue current dose of HCTZ of 6.25 mg daily.   ? ?6.  Hyponatremia ?-Likely secondary to hypovolemic hyponatremia. ?-Resolved.  Sodium at 135.   ? ?7.  Constipation ?-Patient placed on a bowel regimen that he has been refusing. ?-Patient was placed on bisacodyl 5 mg daily as needed for moderate constipation and refused bisacodyl 10 mg x 1. ?-Smog enema was ordered and had a small bowel movement, repeat smog ordered and patient noted to have refused. ?-Patient noted to be refusing other laxatives and agreeable to Senokot 2 tabs twice daily which we will continue. ?-Continue MiraLAX twice daily. ?-Patient stated had a large bowel movement 06/26/2021 ?-Continue current bowel regimen. ?-Patient noted to be refusing bowel regimen per RN. ?-Supportive care. ? ?8.  Bright red blood per rectum ?-Likely secondary to constipation and hemorrhoids. ?-H&H stable at 12.6. ?-Patient seen in consultation by GI earlier on during the  hospitalization however refused EGD. ?-Outpatient follow-up. ? ?9.  Dental pain ?-Orthopantogram done with multiple missing  teeth and dental caries and periapical lucencies. ?-Currently on antibiotics. ?-Outpatient follow-up with dentistry. ? ?10.  Hypoalbuminemia ?-Nutritional supplementation. ?-Dietitian consulted and following

## 2021-07-02 MED ORDER — TRAMADOL HCL 50 MG PO TABS: 50.0000 mg | ORAL_TABLET | Freq: Two times a day (BID) | ORAL | 0 refills | Status: DC | PRN

## 2021-07-02 MED ORDER — BISACODYL 10 MG RE SUPP: 10.0000 mg | RECTAL | 0 refills | Status: AC | PRN

## 2021-07-02 MED ORDER — ACETAMINOPHEN 325 MG PO TABS: 650.0000 mg | ORAL_TABLET | Freq: Four times a day (QID) | ORAL | Status: DC | PRN

## 2021-07-02 MED ORDER — DOCUSATE SODIUM 100 MG PO CAPS: 100.0000 mg | ORAL_CAPSULE | Freq: Two times a day (BID) | ORAL | 2 refills | Status: AC

## 2021-07-02 MED ORDER — CEFADROXIL 500 MG PO CAPS: 1000.0000 mg | ORAL_CAPSULE | Freq: Two times a day (BID) | ORAL | 0 refills | Status: DC

## 2021-07-02 MED ORDER — PANTOPRAZOLE SODIUM 40 MG PO TBEC: 40.0000 mg | DELAYED_RELEASE_TABLET | Freq: Every day | ORAL | 0 refills | Status: AC

## 2021-07-02 MED ORDER — POLYETHYLENE GLYCOL 3350 17 G PO PACK: 17.0000 g | PACK | Freq: Every day | ORAL | 0 refills | Status: AC

## 2021-07-02 MED ORDER — GABAPENTIN 300 MG PO CAPS
300.0000 mg | ORAL_CAPSULE | Freq: Three times a day (TID) | ORAL | 0 refills | Status: AC
Start: 2021-07-02 — End: ?

## 2021-07-02 MED ORDER — OXYCODONE HCL 5 MG PO TABS
5.0000 mg | ORAL_TABLET | ORAL | Status: DC | PRN
Start: 2021-07-02 — End: 2021-07-02
  Administered 2021-07-02: 5 mg via ORAL
  Filled 2021-07-02: qty 1

## 2021-07-02 MED ORDER — ENSURE ENLIVE PO LIQD: 237.0000 mL | Freq: Two times a day (BID) | ORAL | 12 refills | Status: DC

## 2021-07-02 NOTE — TOC Transition Note (Signed)
Transition of Care (TOC) - CM/SW Discharge Note ? ? ?Patient Details  ?Name: Michael [name] ?MRN: [id number] ?Date of Birth: 05/29/1960 ? ?Transition of Care [facility]) CM/SW Contact:  ?Baldemar Lenis, LCSW ?Phone Number: ?07/02/2021, 12:12 PM ? ? ?Clinical Narrative:   CSW confirmed with [facility] that authorization was received, bed is available for today. CSW confirmed with MD about medical stability and patient updated. Transport scheduled with [facility] for next available. ? ?Nurse to call report to [phone], Room 310-2. ? ? ? ?Final next level of care: Skilled Nursing Facility ?Barriers to Discharge: Barriers Resolved ? ? ?Patient Goals and CMS Choice ?  ?CMS Medicare.gov Compare Post Acute Care list provided to:: Patient ?Choice offered to / list presented to : Patient, Spouse ? ?Discharge Placement ?  ?           ?Patient [facility] bed at: [facility] ?Patient to be transferred to facility by: [facility] ?Name of family member notified: Self ?Patient and family notified of of transfer: 07/02/21 ? ?Discharge Plan and Services ?  ?Discharge Planning Services: CM Consult ?           ?  ?  ?  ?  ?  ?HH Arranged: RN ?HH Agency:  Scientist, physiological) ?  ?  ?  ? ?Social Determinants of Health (SDOH) Interventions ?  ? ? ?Readmission Risk Interventions ?   ? View : No data to display.  ?  ?  ?  ? ? ? ? ? ?

## 2021-07-02 NOTE — Discharge Summary (Signed)
?Physician Discharge Summary ?  ?Patient: Michael PlumberJohn R Ronda MRN: [id number] DOB: [birth year]  ?Admit date:     06/11/2021  ?Discharge date: 07/02/21  ?Discharge Physician: Lynden Oxfordranav Dequavius Kuhner  ?PCP: Lovey NewcomerBoyd, William S, PA ? ?Recommendations at discharge: ?Follow up with ID as recommended in 1 week ?Follow up with PCP as recommended  ?Outpatient follow-up with dentistry. ? ?Discharge Diagnoses: ?Principal Problem: ?  Thoracic discitis ?Active Problems: ?  Oral thrush ?  Poor dentition ?  Essential hypertension ?  Class 1 obesity due to excess calories with body mass index (BMI) of 34.0 to 34.9 in adult ?  Pressure injury of skin ?  Nausea and vomiting ?  Constipation ? ? ?Hospital Course: ?The patient. Michael [name] is a 61 year old male with past medical history significant for essential hypertension, obesity who [facility] ED on 4/12 with progressive back pain. Patient also reports persistent nausea/vomiting; and has been seen at the [facility] ED on multiple occasions for such complaint.  Patient recently hospitalized from 3/17 - 3/22 with findings of T8-9 discitis and E. coli bacteremia following recent dental infection.  Patient was discharged on Ancef to continue through 06/27/2021. ?  ?In the ED, WBC count 12.3, hemoglobin 16.2, platelets 309.  Sodium 133, potassium 3.2, chloride 98, CO2 23, glucose 178, BUN 12, creatinine 0.99.  Lipase 41, AST 51, ALT 109.  Total bilirubin 0.8.  CRP 10.2, lactic acid 2.4.  Urinalysis unrevealing.  UDS positive for opiates.  CT abdomen/pelvis with interval worsening of known discitis/hospital-itis at T8-T9 with increased bony erosions and fragmentation of the endplates with increased paravertebral inflammatory changes and soft tissue density, moderate spinal canal stenosis, patchy atelectasis versus infiltrate lung bases, cholelithiasis, stable right adrenal adenoma, degenerative changes lumbar spine with severe spinal canal stenosis L2-3, L4-5.  MR T-spine with substantial  progression T8-9 discitis/osteomyelitis since 3/17, eroded endplates with 25% loss of T8 vertebral body height since that time, probable septic facet joints, increased paraspinal phlegmon and can fluid epidural and dural thickening, increased spinal stenosis T8-9 spinal cord mass effect but with no cord edema.   ? ?Seen by neurosurgery, recommend medical management, continue IV antibiotics.  Infectious disease consulted and recommending continuing IV Ceftriaxone until 06/27/21.  ?Given his Nausea and Vomiting, GI was consulted and offered EGD but patient refused and recommending Scheduled Zofran and PPI BID.  ?PT OT evaluated and recommended CIR but patient did not demonstrate the ability for intensive rehab required to admit to CIR so now recommendations for him go to SNF.  ? ?Assessment and Plan: ?#1 T8-9 discitis/osteomyelitis ?-Patient presented with progressive back pain. ?-Imaging done concerning for substantial progression of T8-9 discitis/osteomyelitis since 3/17, eroded endplates with 25% loss of T8 vertebral body height since that time, probable septic facet joint, increased paraspinal phlegmon and dural thickening, increased spinal stenosis T8-9 spinal cord mass effect but with no cord edema. ?-CRP noted to be elevated at 10.2, lactic acid at 2.5, WBC at 12.3 on admission. ?-Patient assessed by neurosurgery who recommended medical management with IV antibiotics per ID directions and no surgical input at this time ?-Leukocytosis trended down. ?-Blood cultures with no growth to date ?-Status post T8-9 disc space aspiration per IR 06/13/2021 with cultures with no growth. ?-Patient seen by ID recommending continuation of Rocephin 2 g daily until 06/27/2021,  patient now currently on oral cefadroxil for an additional 2 weeks per ID recommendations and subsequent consideration for reimaging sometime post antibiotic completion.  ?-Continue Neurontin 300 mg twice daily. ?-  Patient seen by PT who recommended CIR but  patient not a candidate and will need to go to SNF. ? ?2.  Intermittent nausea/vomiting ?-Patient noted to have some episodic nausea/vomiting seen in the ED several times. ?-CT abdomen and pelvis done negative for obstruction, no increased stool burden, small gallstones. ?-Concern for probable esophageal candidiasis with known recurrent oral thrush versus atypical presentation of gallstones. ?-GI was consulted offered EGD but patient declined. ?-Nausea and vomiting improved. ?-Continue PPI. ?-Follow-up KUB done with no acute abnormalities. ?Patient noted to have tolerated diet when his mother is there to encourage p.o. intake. ?-Continue current bowel regimen. ? ?3. transaminitis ?-Felt likely secondary to progressive infection from osteomyelitis versus medication side effects with Rocephin. ?-Acute hepatitis panel negative. ?-Transaminitis trending down. ?-Outpatient follow-up. ? ?4.  Oral thrush ?-Patient due to refusal of oral medications intermittently was subsequently started on IV fluconazole 200 mg daily. ? ?5.  Hypertension ?-Antihypertensive medications initially held due to soft blood pressure. ?-Was on IV Lopressor that has subsequently been discontinued and patient resumed back on home regimen of lisinopril/ HCTZ. ? ?6.  Hyponatremia ?-Likely secondary to hypovolemic hyponatremia. ?-Resolved.  Sodium at 135.   ? ?7.  Constipation ?-Patient placed on a bowel regimen that he has been refusing. ?-Smog enema was ordered and had a small bowel movement, repeat smog ordered and patient noted to have refused. ?-Continue current bowel regimen. ? ?8.  Bright red blood per rectum ?-Likely secondary to constipation and hemorrhoids. ?-H&H stable at 12.6. ?-Patient seen in consultation by GI earlier on during the hospitalization however refused EGD. ?-Outpatient follow-up. ? ?9.  Dental pain ?-Orthopantogram done with multiple missing teeth and dental caries and periapical lucencies. ?-Currently on  antibiotics. ?-Outpatient follow-up with dentistry. ? ?10.  Hypoalbuminemia ?-Nutritional supplementation. ?-Dietitian consulted and following. ? ?11. physical deconditioning ?-Seen by PT/ OT who are currently recommending SNF. ?-CIR initially recommended however patient did not demonstrate the ability for intensive rehab. ? ?12.  Bilateral vision blurriness, stable ?-Patient with some complaints of bilateral visual blurriness, does not wear glasses however noted to have reading glasses. ?-CT head done negative. ?-Outpatient follow-up with ophthalmology. ? ?13.  Obesity ?-Weight loss and dietary counseling given, patient noted need for aggressive lifestyle changes/weight loss as it complicates all facets of his care. ?-Outpatient follow-up with PCP. ? ?14.  Hypokalemia ?-Repleted. ? ?15.??  Depression ?-Psychiatry consulted and assessed the patient. ?-No medications recommended by psychiatry at this time. ? ?16. Stage 2 mid coccyx pressure ulcer. Not present on admission  ?Refuses care often  ?Reminded that his bottom has MASD and has redness and needs to turn and be repositioned q 2 hours to prevent further skin breakdown.  He does not like to move. ? ?Consultants: ID ?Psych  ?Gastroenterology  ?Procedures performed:  ?PICC line placement  ?T8/T9 disc space aspirate, T9 superior endplate biopsy  ? ?DISCHARGE MEDICATION: ?Allergies as of 07/02/2021   ?No Known Allergies ?  ? ?  ?Medication List  ?  ? ?STOP taking these medications   ? ?oxyCODONE-acetaminophen 5-325 MG tablet ?Commonly known as: PERCOCET/ROXICET ?  ?promethazine 25 MG suppository ?Commonly known as: PHENERGAN ?  ?promethazine 25 MG tablet ?Commonly known as: PHENERGAN ?  ?promethazine 6.25 MG/5ML syrup ?Commonly known as: PHENERGAN ?  ?ROCEPHIN IV ?  ? ?  ? ?TAKE these medications   ? ?acetaminophen 325 MG tablet ?Commonly known as: TYLENOL ?Take 2 tablets (650 mg total) by mouth every 6 (six) hours as needed for  mild pain, fever or headache (or Fever  >/= 101). ?  ?bisacodyl 10 MG suppository ?Commonly known as: Dulcolax ?Place 1 suppository (10 mg total) rectally as needed for moderate constipation. ?  ?cefadroxil 500 MG capsule ?Commonly known as: DURICEF ?Take 2 capsules (1

## 2021-07-02 NOTE — Progress Notes (Signed)
Refuses care often and also refuses bowel regimen meds but complains of not having a daily BM.  Abdomen distended but soft.  Passing gas and active bowel sounds.  Was reminded that he needs to try to not refuse meds to help him to have a BM.  York Spaniel he will cooperate.  Also, was reminded that his bottom has MASD and has redness and needs to turn and be repositioned q 2 hours to prevent further skin breakdown.  He said he will try to allow staff to help him reposition but does not like to move.  He was reminded that he cannot stay on his back, as that is his preferred position, because it is causing direct pressure to his bottom where the redness is.   ?

## 2021-07-09 ENCOUNTER — Telehealth: Payer: BC Managed Care – PPO | Admitting: Internal Medicine

## 2021-07-09 ENCOUNTER — Other Ambulatory Visit: Payer: Self-pay

## 2021-07-09 ENCOUNTER — Inpatient Hospital Stay (HOSPITAL_COMMUNITY)
Admission: AD | Admit: 2021-07-09 | Discharge: 2021-07-31 | DRG: 454 | Disposition: A | Discharge status: Skilled Nursing Facility | Payer: BC Managed Care – PPO | Source: Other Acute Inpatient Hospital | Attending: Internal Medicine | Admitting: Internal Medicine

## 2021-07-09 ENCOUNTER — Encounter (HOSPITAL_COMMUNITY): Payer: Self-pay | Admitting: Internal Medicine

## 2021-07-09 DIAGNOSIS — R17 Unspecified jaundice: Secondary | ICD-10-CM | POA: Diagnosis not present

## 2021-07-09 DIAGNOSIS — R739 Hyperglycemia, unspecified: Secondary | ICD-10-CM | POA: Diagnosis not present

## 2021-07-09 DIAGNOSIS — F32A Depression, unspecified: Secondary | ICD-10-CM | POA: Diagnosis not present

## 2021-07-09 DIAGNOSIS — E876 Hypokalemia: Secondary | ICD-10-CM | POA: Diagnosis not present

## 2021-07-09 DIAGNOSIS — G4700 Insomnia, unspecified: Secondary | ICD-10-CM | POA: Diagnosis not present

## 2021-07-09 DIAGNOSIS — M40204 Unspecified kyphosis, thoracic region: Secondary | ICD-10-CM | POA: Diagnosis present

## 2021-07-09 DIAGNOSIS — D696 Thrombocytopenia, unspecified: Secondary | ICD-10-CM | POA: Diagnosis not present

## 2021-07-09 DIAGNOSIS — M4804 Spinal stenosis, thoracic region: Secondary | ICD-10-CM | POA: Diagnosis present

## 2021-07-09 DIAGNOSIS — F430 Acute stress reaction: Secondary | ICD-10-CM | POA: Diagnosis present

## 2021-07-09 DIAGNOSIS — K219 Gastro-esophageal reflux disease without esophagitis: Secondary | ICD-10-CM | POA: Diagnosis present

## 2021-07-09 DIAGNOSIS — E860 Dehydration: Secondary | ICD-10-CM | POA: Diagnosis not present

## 2021-07-09 DIAGNOSIS — B370 Candidal stomatitis: Secondary | ICD-10-CM | POA: Diagnosis not present

## 2021-07-09 DIAGNOSIS — R45851 Suicidal ideations: Secondary | ICD-10-CM | POA: Diagnosis not present

## 2021-07-09 DIAGNOSIS — M4644 Discitis, unspecified, thoracic region: Secondary | ICD-10-CM | POA: Diagnosis present

## 2021-07-09 DIAGNOSIS — Z91199 Patient's noncompliance with other medical treatment and regimen due to unspecified reason: Secondary | ICD-10-CM

## 2021-07-09 DIAGNOSIS — I10 Essential (primary) hypertension: Secondary | ICD-10-CM | POA: Diagnosis present

## 2021-07-09 DIAGNOSIS — M4624 Osteomyelitis of vertebra, thoracic region: Secondary | ICD-10-CM | POA: Diagnosis present

## 2021-07-09 DIAGNOSIS — N179 Acute kidney failure, unspecified: Secondary | ICD-10-CM | POA: Diagnosis not present

## 2021-07-09 DIAGNOSIS — Z7401 Bed confinement status: Secondary | ICD-10-CM

## 2021-07-09 DIAGNOSIS — R7303 Prediabetes: Secondary | ICD-10-CM | POA: Diagnosis present

## 2021-07-09 DIAGNOSIS — B9620 Unspecified Escherichia coli [E. coli] as the cause of diseases classified elsewhere: Secondary | ICD-10-CM | POA: Diagnosis present

## 2021-07-09 DIAGNOSIS — M4654 Other infective spondylopathies, thoracic region: Secondary | ICD-10-CM | POA: Diagnosis present

## 2021-07-09 DIAGNOSIS — I9581 Postprocedural hypotension: Secondary | ICD-10-CM | POA: Diagnosis not present

## 2021-07-09 DIAGNOSIS — Z91148 Patient's other noncompliance with medication regimen for other reason: Secondary | ICD-10-CM

## 2021-07-09 DIAGNOSIS — K08109 Complete loss of teeth, unspecified cause, unspecified class: Secondary | ICD-10-CM | POA: Diagnosis present

## 2021-07-09 DIAGNOSIS — K117 Disturbances of salivary secretion: Secondary | ICD-10-CM | POA: Diagnosis not present

## 2021-07-09 DIAGNOSIS — M869 Osteomyelitis, unspecified: Secondary | ICD-10-CM | POA: Diagnosis present

## 2021-07-09 DIAGNOSIS — D62 Acute posthemorrhagic anemia: Secondary | ICD-10-CM | POA: Diagnosis not present

## 2021-07-09 DIAGNOSIS — Z6838 Body mass index (BMI) 38.0-38.9, adult: Secondary | ICD-10-CM

## 2021-07-09 DIAGNOSIS — R112 Nausea with vomiting, unspecified: Secondary | ICD-10-CM | POA: Diagnosis present

## 2021-07-09 DIAGNOSIS — K089 Disorder of teeth and supporting structures, unspecified: Secondary | ICD-10-CM | POA: Diagnosis present

## 2021-07-09 DIAGNOSIS — E872 Acidosis, unspecified: Secondary | ICD-10-CM | POA: Diagnosis not present

## 2021-07-09 DIAGNOSIS — Z79899 Other long term (current) drug therapy: Secondary | ICD-10-CM

## 2021-07-09 DIAGNOSIS — F4321 Adjustment disorder with depressed mood: Secondary | ICD-10-CM | POA: Diagnosis not present

## 2021-07-09 DIAGNOSIS — E861 Hypovolemia: Secondary | ICD-10-CM | POA: Diagnosis not present

## 2021-07-09 DIAGNOSIS — L89152 Pressure ulcer of sacral region, stage 2: Secondary | ICD-10-CM | POA: Diagnosis present

## 2021-07-09 DIAGNOSIS — M8620 Subacute osteomyelitis, unspecified site: Secondary | ICD-10-CM | POA: Diagnosis not present

## 2021-07-09 DIAGNOSIS — M8629 Subacute osteomyelitis, multiple sites: Secondary | ICD-10-CM | POA: Diagnosis not present

## 2021-07-09 DIAGNOSIS — R110 Nausea: Secondary | ICD-10-CM | POA: Diagnosis not present

## 2021-07-09 DIAGNOSIS — I9589 Other hypotension: Secondary | ICD-10-CM | POA: Diagnosis not present

## 2021-07-09 LAB — CBC
HCT: 38.8 % — ABNORMAL LOW (ref 39.0–52.0)
Hemoglobin: 12.7 g/dL — ABNORMAL LOW (ref 13.0–17.0)
MCH: 29.7 pg (ref 26.0–34.0)
MCHC: 32.7 g/dL (ref 30.0–36.0)
MCV: 90.9 fL (ref 80.0–100.0)
Platelets: 364 10*3/uL (ref 150–400)
RBC: 4.27 MIL/uL (ref 4.22–5.81)
RDW: 16.5 % — ABNORMAL HIGH (ref 11.5–15.5)
WBC: 10.8 10*3/uL — ABNORMAL HIGH (ref 4.0–10.5)
nRBC: 0 % (ref 0.0–0.2)

## 2021-07-09 LAB — CREATININE, SERUM
Creatinine, Ser: 0.97 mg/dL (ref 0.61–1.24)
GFR, Estimated: >60 mL/min (ref >60)

## 2021-07-09 MED ORDER — SODIUM CHLORIDE 0.9 % IV SOLN: INTRAVENOUS | Status: DC

## 2021-07-09 MED ORDER — HYDROCHLOROTHIAZIDE 25 MG PO TABS
25.0000 mg | ORAL_TABLET | Freq: Every day | ORAL | Status: DC
  Administered 2021-07-09 – 2021-07-10 (×2): 25 mg via ORAL
  Filled 2021-07-09 (×3): qty 1

## 2021-07-09 MED ORDER — DOCUSATE SODIUM 100 MG PO CAPS
100.0000 mg | ORAL_CAPSULE | Freq: Two times a day (BID) | ORAL | Status: DC
  Administered 2021-07-10: 100 mg via ORAL
  Filled 2021-07-09 (×7): qty 1

## 2021-07-09 MED ORDER — POLYETHYLENE GLYCOL 3350 17 G PO PACK
17.0000 g | PACK | Freq: Every day | ORAL | Status: DC
  Administered 2021-07-09 – 2021-07-18 (×3): 17 g via ORAL
  Filled 2021-07-09 (×5): qty 1

## 2021-07-09 MED ORDER — GABAPENTIN 600 MG PO TABS
300.0000 mg | ORAL_TABLET | Freq: Three times a day (TID) | ORAL | Status: DC
  Administered 2021-07-09 – 2021-07-12 (×6): 300 mg via ORAL
  Filled 2021-07-09 (×10): qty 1

## 2021-07-09 MED ORDER — HEPARIN SODIUM (PORCINE) 5000 UNIT/ML IJ SOLN
5000.0000 [IU] | Freq: Three times a day (TID) | INTRAMUSCULAR | Status: DC
  Administered 2021-07-09 – 2021-07-13 (×11): 5000 [IU] via SUBCUTANEOUS
  Filled 2021-07-09 (×12): qty 1

## 2021-07-09 MED ORDER — ACETAMINOPHEN 325 MG PO TABS
650.0000 mg | ORAL_TABLET | Freq: Four times a day (QID) | ORAL | Status: DC | PRN
  Administered 2021-07-12 – 2021-07-22 (×2): 650 mg via ORAL
  Filled 2021-07-09 (×2): qty 2

## 2021-07-09 MED ORDER — PANTOPRAZOLE SODIUM 40 MG PO TBEC: 40.0000 mg | DELAYED_RELEASE_TABLET | Freq: Two times a day (BID) | ORAL | Status: DC

## 2021-07-09 MED ORDER — PIPERACILLIN-TAZOBACTAM 3.375 G IVPB
3.3750 g | Freq: Three times a day (TID) | INTRAVENOUS | Status: DC
  Administered 2021-07-09 – 2021-07-11 (×5): 3.375 g via INTRAVENOUS
  Filled 2021-07-09 (×8): qty 50

## 2021-07-09 MED ORDER — NYSTATIN 100000 UNIT/ML MT SUSP
5.0000 mL | Freq: Four times a day (QID) | OROMUCOSAL | Status: AC
  Administered 2021-07-09 – 2021-07-16 (×19): 500000 [IU] via OROMUCOSAL
  Filled 2021-07-09 (×26): qty 5

## 2021-07-09 MED ORDER — TRAZODONE HCL 50 MG PO TABS
25.0000 mg | ORAL_TABLET | Freq: Every evening | ORAL | Status: DC | PRN
  Administered 2021-07-18: 25 mg via ORAL
  Filled 2021-07-09 (×2): qty 1

## 2021-07-09 MED ORDER — ACETAMINOPHEN 650 MG RE SUPP: 650.0000 mg | Freq: Four times a day (QID) | RECTAL | Status: DC | PRN

## 2021-07-09 MED ORDER — VANCOMYCIN HCL 1250 MG/250ML IV SOLN
1250.0000 mg | Freq: Two times a day (BID) | INTRAVENOUS | Status: DC
Start: 2021-07-10 — End: 2021-07-11
  Administered 2021-07-10 – 2021-07-11 (×2): 1250 mg via INTRAVENOUS
  Filled 2021-07-09 (×2): qty 250

## 2021-07-09 MED ORDER — PANTOPRAZOLE SODIUM 40 MG IV SOLR
40.0000 mg | Freq: Two times a day (BID) | INTRAVENOUS | Status: DC
  Administered 2021-07-09 – 2021-07-12 (×5): 40 mg via INTRAVENOUS
  Filled 2021-07-09 (×5): qty 10

## 2021-07-09 MED ORDER — LISINOPRIL 5 MG PO TABS
5.0000 mg | ORAL_TABLET | Freq: Every day | ORAL | Status: DC
  Administered 2021-07-09 – 2021-07-10 (×2): 5 mg via ORAL
  Filled 2021-07-09 (×3): qty 1

## 2021-07-09 MED ORDER — MORPHINE SULFATE (PF) 2 MG/ML IV SOLN
2.0000 mg | INTRAVENOUS | Status: DC | PRN
  Administered 2021-07-11: 2 mg via INTRAVENOUS
  Filled 2021-07-09: qty 1

## 2021-07-09 NOTE — Progress Notes (Signed)
Arrived per EMS to rm 24 in stable condition at this time. Pt is alert and talking. Slow to respond to questions. Transferred into hospital bed with 5 assists.  ?

## 2021-07-09 NOTE — Assessment & Plan Note (Signed)
Long standing problem. No visible infections. Patient with oral candidiasis ? ?Plan Nystatin swish and spit ?

## 2021-07-09 NOTE — Assessment & Plan Note (Signed)
Continue ACE-I and diuretic ?

## 2021-07-09 NOTE — Subjective & Objective (Signed)
61 y/o man with recent h/o discitis/osteomyelitis T8-9 with initial admission 3/17-3/22/23 when he was discharged on medical management with oral abx. Second admission 4/12-07/02/21 for progressive discititis and osteomyelitis. He underwent aspiration of fluid with bone bx T8-9 06/13/21. He was seen by neurosurgery and ID. He was discharged on medical management completing Rocephin 06/28/30 and switched to cefadroxil. He has had several visit to [facility] ED for pain issues.  ? ?Patient was seen at [facility] ED on the day of admission for worsening pain. He had a mild leukocytosis at 13.3, nl Hgb, nl Chemistries, Lactic acid 1.6. CT refealed progressive osteomyelitis/discititis at T8-9 compared to recent studies with bony erosion. He had a paravertebral phlegmon with possible abscesses, question of septic facet joints. IV abx started: Vancomycin and Zosyn. Dr. Jenene Slicker for NS consulted who agreed with transfer to [facility] and for NS consult with consideration of operative intervention. ?

## 2021-07-09 NOTE — Assessment & Plan Note (Signed)
Continued inflammation associated with osteomyelitis ? ?Plan Pain mgt as above ?

## 2021-07-09 NOTE — H&P (Signed)
?History and Physical  ? ? ?Michael [name] [id number] DOB: 1960/07/14 DOA: 07/09/2021 ? ?DOS: the patient was seen and examined on 07/09/2021 ? ?PCP: Lavella Lemons, PA  ? ?Patient coming from:  transfer from [facility], was at SNF ? ?I have personally briefly reviewed patient's old medical records in [place] ? ?61 y/o man with recent h/o discitis/osteomyelitis T8-9 with initial admission 3/17-3/22/23 when he was discharged on medical management with oral abx. Second admission 4/12-07/02/21 for progressive discititis and osteomyelitis. He underwent aspiration of fluid with bone bx T8-9 06/13/21. He was seen by neurosurgery and ID. He was discharged on medical management completing Rocephin 06/28/30 and switched to cefadroxil. He has had several visit to [facility] ED for pain issues.  ? ?Patient was seen at [facility] ED on the day of admission for worsening pain. He had a mild leukocytosis at 13.3, nl Hgb, nl Chemistries, Lactic acid 1.6. CT refealed progressive osteomyelitis/discititis at T8-9 compared to recent studies with bony erosion. He had a paravertebral phlegmon with possible abscesses, question of septic facet joints. IV abx started: Vancomycin and Zosyn. Dr. Duwayne Heck for NS consulted who agreed with transfer to [facility] and for NS consult with consideration of operative intervention.  ? ?ED Course: direct transfer ? ?Review of Systems:  ?Review of Systems  ?Constitutional:  Positive for malaise/fatigue. Negative for fever.  ?HENT: Negative.    ?Eyes:  Positive for blurred vision.  ?Respiratory: Negative.    ?Cardiovascular:  Negative for chest pain and palpitations.  ?Gastrointestinal:  Positive for constipation, heartburn and nausea.  ?Genitourinary: Negative.   ?Musculoskeletal:  Positive for back pain.  ?Skin: Negative.   ?     Small area buttock where he scratched  ?Neurological: Negative.   ?Endo/Heme/Allergies: Negative.   ?Psychiatric/Behavioral: Negative.    ? ?Past Medical History:   ?Diagnosis Date  ? Class 1 obesity due to excess calories with body mass index (BMI) of 34.0 to 34.9 in adult   ? Diskitis   ? Hypertension   ? ? ?Past Surgical History:  ?Procedure Laterality Date  ? IR CERVICAL/THORACIC DISC ASPIRATION W/IMAG GUIDE  05/18/2021  ? IR FLUORO GUIDED NEEDLE PLC ASPIRATION/INJECTION LOC  06/13/2021  ? ? ?Soc Hx - married 32 years. Has one son, two daughters, 1 grandchild. Was living with his spouse. Retired Magazine features editor.  ? ? reports that he has never smoked. He has never used smokeless tobacco. He reports that he does not currently use alcohol. He reports that he does not use drugs. ? ?No Known Allergies ? ?No family history on file. ? ?Prior to Admission medications   ?Medication Sig Start Date End Date Taking? Authorizing Provider  ?acetaminophen (TYLENOL) 325 MG tablet Take 2 tablets (650 mg total) by mouth every 6 (six) hours as needed for mild pain, fever or headache (or Fever >/= 101). 07/02/21  Yes Lavina Hamman, MD  ?bisacodyl (DULCOLAX) 10 MG suppository Place 1 suppository (10 mg total) rectally as needed for moderate constipation. 07/02/21  Yes Lavina Hamman, MD  ?docusate sodium (COLACE) 100 MG capsule Take 1 capsule (100 mg total) by mouth 2 (two) times daily. 07/02/21 07/02/22 Yes Lavina Hamman, MD  ?feeding supplement (ENSURE ENLIVE / ENSURE PLUS) LIQD Take 237 mLs by mouth 2 (two) times daily between meals. 07/02/21  Yes Lavina Hamman, MD  ?gabapentin (NEURONTIN) 300 MG capsule Take 1 capsule (300 mg total) by mouth 3 (three) times daily. 07/02/21  Yes Lavina Hamman,  MD  ?lisinopril-hydrochlorothiazide (ZESTORETIC) 10-12.5 MG tablet Take 0.5 tablets by mouth daily.   Yes [provider]  ?cefadroxil (DURICEF) 500 MG capsule Take 2 capsules (1,000 mg total) by mouth 2 (two) times daily for 10 days. 07/02/21 07/12/21  Lavina Hamman, MD  ?pantoprazole (PROTONIX) 40 MG tablet Take 1 tablet (40 mg total) by mouth daily for 14 days. 07/02/21 07/16/21  Lavina Hamman, MD  ?polyethylene glycol (MIRALAX / GLYCOLAX) 17 g packet Take 17 g by mouth daily. 07/02/21   Lavina Hamman, MD  ?traMADol (ULTRAM) 50 MG tablet Take 1 tablet (50 mg total) by mouth every 12 (twelve) hours as needed for moderate pain or severe pain. 07/02/21 07/02/22  Lavina Hamman, MD  ? ? ?Physical Exam: ?There were no vitals filed for this visit. ? ?Physical Exam ?Vitals reviewed: outside ED notes reviewed.  ?Constitutional:   ?   General: He is not in acute distress. ?   Appearance: He is obese. He is not ill-appearing.  ?HENT:  ?   Head: Normocephalic and atraumatic.  ?   Mouth/Throat:  ?   Comments: Missing most of his teeth. Has partial. Tongue - erythematous. White plaques on hard palate. ?Eyes:  ?   Extraocular Movements: Extraocular movements intact.  ?   Conjunctiva/sclera: Conjunctivae normal.  ?   Pupils: Pupils are equal, round, and reactive to light.  ?Cardiovascular:  ?   Rate and Rhythm: Normal rate and regular rhythm.  ?   Pulses: Normal pulses.  ?   Heart sounds: Normal heart sounds.  ?Pulmonary:  ?   Effort: Pulmonary effort is normal.  ?   Breath sounds: Normal breath sounds. No wheezing or rales.  ?Abdominal:  ?   Tenderness: There is no abdominal tenderness. There is no guarding.  ?   Comments: Morbidly obese, BS hypoactive, exam limited by girth. Non-tender  ?Musculoskeletal:     ?   General: No swelling or deformity.  ?   Cervical back: Normal range of motion and neck supple.  ?Skin: ?   General: Skin is warm and dry.  ?   Comments: Erythematous excoriation right buttock. No skin breakdown/decub  ?Neurological:  ?   General: No focal deficit present.  ?   Mental Status: He is alert and oriented to person, place, and time.  ?Psychiatric:     ?   Mood and Affect: Mood normal.     ?   Behavior: Behavior normal.  ?  ? ?Labs on Admission: I have personally reviewed following labs and imaging studies ? ?CBC: ?No results for input(s): WBC, NEUTROABS, HGB, HCT, MCV, PLT in the last 168  hours. ?Basic Metabolic Panel: ?No results for input(s): NA, K, CL, CO2, GLUCOSE, BUN, CREATININE, CALCIUM, MG, PHOS in the last 168 hours. ?GFR: ?Estimated Creatinine Clearance: 110.8 mL/min (by C-G formula based on SCr of 0.97 mg/dL). ?Liver Function Tests: ?No results for input(s): AST, ALT, ALKPHOS, BILITOT, PROT, ALBUMIN in the last 168 hours. ?No results for input(s): LIPASE, AMYLASE in the last 168 hours. ?No results for input(s): AMMONIA in the last 168 hours. ?Coagulation Profile: ?No results for input(s): INR, PROTIME in the last 168 hours. ?Cardiac Enzymes: ?No results for input(s): CKTOTAL, CKMB, CKMBINDEX, TROPONINI in the last 168 hours. ?BNP (last 3 results) ?No results for input(s): PROBNP in the last 8760 hours. ?HbA1C: ?No results for input(s): HGBA1C in the last 72 hours. ?CBG: ?No results for input(s): GLUCAP in the last 168 hours. ?Lipid  Profile: ?No results for input(s): CHOL, HDL, LDLCALC, TRIG, CHOLHDL, LDLDIRECT in the last 72 hours. ?Thyroid Function Tests: ?No results for input(s): TSH, T4TOTAL, FREET4, T3FREE, THYROIDAB in the last 72 hours. ?Anemia Panel: ?No results for input(s): VITAMINB12, FOLATE, FERRITIN, TIBC, IRON, RETICCTPCT in the last 72 hours. ?Urine analysis: ?   ?Component Value Date/Time  ? [place] YELLOW 06/12/2021 [id number]  ? APPEARANCEUR CLEAR 06/12/2021 [id number]  ? LABSPEC 1.010 06/12/2021 0613  ? PHURINE 6.0 06/12/2021 0613  ? GLUCOSEU NEGATIVE 06/12/2021 0613  ? HGBUR TRACE (A) 06/12/2021 [id number]  ? [place] NEGATIVE 06/12/2021 0613  ? [place] NEGATIVE 06/12/2021 [id number]  ? [place] NEGATIVE 06/12/2021 [id number]  ? NITRITE NEGATIVE 06/12/2021 0613  ? LEUKOCYTESUR NEGATIVE 06/12/2021 [id number]  ? ? ?Radiological Exams on Admission: I have personally reviewed images ?No results found. ? ?EKG: I have personally reviewed EKG: no EKG available. Tele rhythm nl. ? ?Assessment/Plan ?Principal Problem: ?  Osteomyelitis of thoracic region [facility]) ?Active Problems: ?  Thoracic discitis ?  Poor  dentition ?  Nausea and vomiting ?  Essential hypertension ?  ? ?Assessment and Plan: ?* Osteomyelitis of thoracic region [facility]) ?Persistent and worsening osteomyelitis T8-9 now with phlegmon and question of abscesses,

## 2021-07-09 NOTE — Assessment & Plan Note (Signed)
Persistent and worsening osteomyelitis T8-9 now with phlegmon and question of abscesses, septic facet joints after two hospitalizations and multiple rounds of IV and oral abx. Increasing pain. No focal neurologic findings. ? ?Plan Med-surg admit ? Continue Vanc/Zosyn ? Re-evaluation by NS for possible surgical intervention ? Pain mgt with Ketorolac, MS as needed; continue neurontin. ?

## 2021-07-09 NOTE — Progress Notes (Signed)
Pharmacy Antibiotic Note ? ?Michael [name] is a 61 y.o. male admitted on 07/09/2021 with  discitis/osteomyelitis .  Pharmacy has been consulted for Vancomycin dosing. Pt with recent h/o discitis/osteomyelitis T8-9 - completed 8 weeks of IV Rocephin on 4/28 and then on cefadroxil since 4/29 (plan was for 2 weeks of this and then spine re-imaging). ? ?Pt went to [facility] ED 5/9 with worsening pain. CT reveals progressive osteomyelitis/discitis with possible abscesses. Transferred to [facility]. ? ?Pt received Vanc 2gm 5/9 at 2000 then 1250mg  IV 5/10 ~0800 and Zosyn 4.5gm q6h - last dose 5/10~2000 ? ?Plan: ?Zosyn 3.375gm IV q8h ordered by MD ?Vancomycin 1250 mg IV Q 12 hrs. Goal AUC 400-550. ?Expected AUC: 516; SCr used: 0.97; Vd coeff 0.5 ?Will f/u renal function, micro data, and pt's clinical condition ?Vanc levels prn ? ? ?  ? ?No data recorded. ? ?Recent Labs  ?Lab 07/09/21 ?2143  ?WBC 10.8*  ?CREATININE 0.97  ?  ?Estimated Creatinine Clearance: 110.8 mL/min (by C-G formula based on SCr of 0.97 mg/dL).   ? ?No Known Allergies ? ?Antimicrobials this admission: ?5/9 Vanc (started at OSH) >>  ?5/9 Zosyn (started at OSH) >>  ? ? ?Microbiology results: ?5/9 BCx [facility]):  ? ?Thank you for allowing pharmacy to be a part of this patient?s care. ? ?Michael [name], PharmD, BCPS ?Please see amion for complete clinical pharmacist phone list ?07/09/2021 10:36 PM ? ?

## 2021-07-09 NOTE — Assessment & Plan Note (Signed)
Intermittent recurring problem. GI saw patient during April admission. Patient declined EGD ? ?Plan Continue PPI - IV ?

## 2021-07-10 ENCOUNTER — Inpatient Hospital Stay (HOSPITAL_COMMUNITY): Payer: BC Managed Care – PPO

## 2021-07-10 ENCOUNTER — Encounter (HOSPITAL_COMMUNITY): Payer: Self-pay | Admitting: Internal Medicine

## 2021-07-10 DIAGNOSIS — M4624 Osteomyelitis of vertebra, thoracic region: Secondary | ICD-10-CM | POA: Diagnosis not present

## 2021-07-10 LAB — CBC
HCT: 37.3 % — ABNORMAL LOW (ref 39.0–52.0)
Hemoglobin: 11.9 g/dL — ABNORMAL LOW (ref 13.0–17.0)
MCH: 29.5 pg (ref 26.0–34.0)
MCHC: 31.9 g/dL (ref 30.0–36.0)
MCV: 92.3 fL (ref 80.0–100.0)
Platelets: 369 10*3/uL (ref 150–400)
RBC: 4.04 MIL/uL — ABNORMAL LOW (ref 4.22–5.81)
RDW: 16.6 % — ABNORMAL HIGH (ref 11.5–15.5)
WBC: 11.9 10*3/uL — ABNORMAL HIGH (ref 4.0–10.5)
nRBC: 0 % (ref 0.0–0.2)

## 2021-07-10 LAB — BASIC METABOLIC PANEL
Anion gap: 10 (ref 5–15)
BUN: 9 mg/dL (ref 6–20)
CO2: 22 mmol/L (ref 22–32)
Calcium: 9 mg/dL (ref 8.9–10.3)
Chloride: 104 mmol/L (ref 98–111)
Creatinine, Ser: 1 mg/dL (ref 0.61–1.24)
GFR, Estimated: >60 mL/min (ref >60)
Glucose, Bld: 110 mg/dL — ABNORMAL HIGH (ref 70–99)
Potassium: 3.2 mmol/L — ABNORMAL LOW (ref 3.5–5.1)
Sodium: 136 mmol/L (ref 135–145)

## 2021-07-10 LAB — TYPE AND SCREEN
ABO/RH(D): O POS
Antibody Screen: NEGATIVE

## 2021-07-10 LAB — HEMOGLOBIN A1C
Hgb A1c MFr Bld: 6.1 % — ABNORMAL HIGH (ref 4.8–5.6)
Mean Plasma Glucose: 128.37 mg/dL

## 2021-07-10 LAB — C-REACTIVE PROTEIN: CRP: 1.7 mg/dL — ABNORMAL HIGH (ref <1.0)

## 2021-07-10 LAB — PROCALCITONIN: Procalcitonin: <0.1 ng/mL

## 2021-07-10 LAB — MRSA NEXT GEN BY PCR, NASAL: MRSA by PCR Next Gen: NOT DETECTED

## 2021-07-10 IMAGING — MR MR THORACIC SPINE WO/W CM
5 of 9 series · 18 of 48 positions shown · IV contrast (gadavist)
Comparison: CT thoracic spine [DATE], MRI thoracic spine
[DATE]

CLINICAL DATA: Thoracic osteomyelitis.

EXAM:
MRI THORACIC WITHOUT AND WITH CONTRAST
TECHNIQUE: Multiplanar and multiecho pulse sequences of the thoracic spine were
obtained without and with intravenous contrast.
CONTRAST:  10mL GADAVIST GADOBUTROL 1 MMOL/ML IV SOLN

[Series 3: T1 · sagittal · 3.0mm · 0.90mm/px · 1 of 12 slices shown (1 of 3)]
[im 1/12]
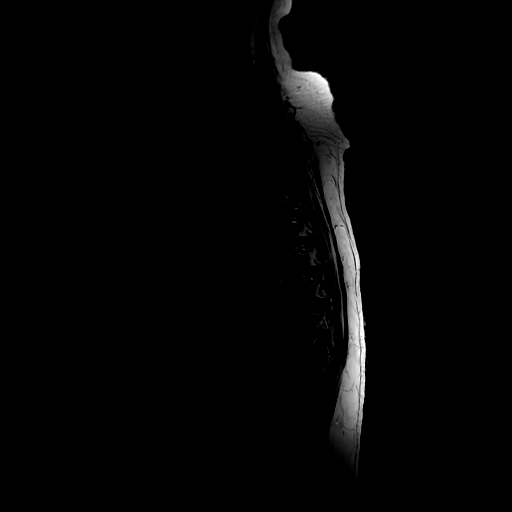

[Series 5: T2 · sagittal · 3.0mm · 0.70mm/px · 3 of 17 slices shown (1 of 2)]
[im 1/17]
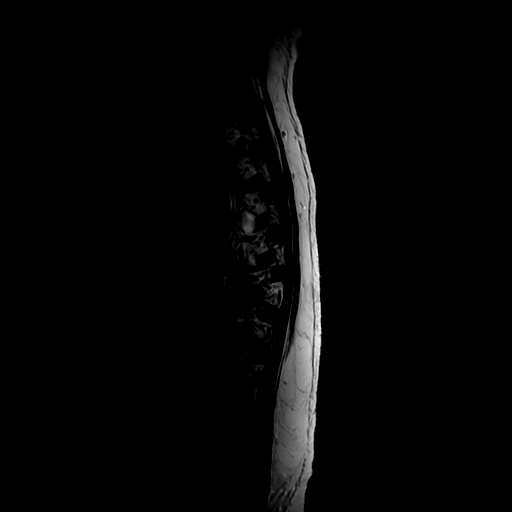
[im 9/17]
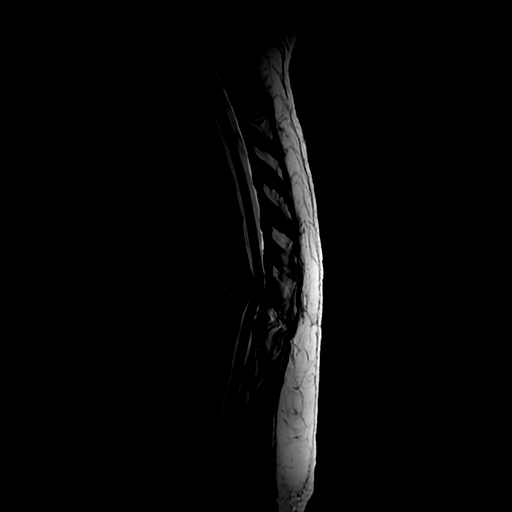
[im 17/17]
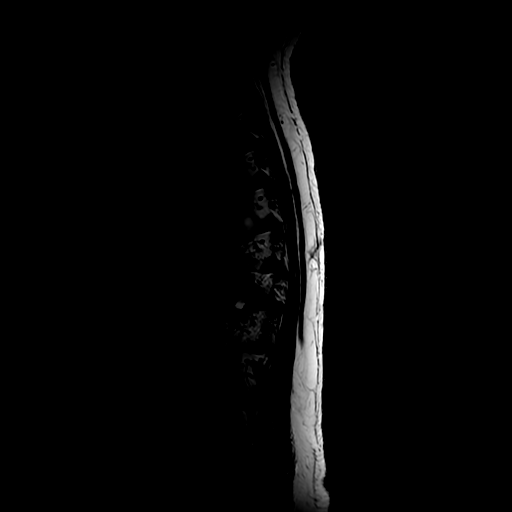

[Series 6: T1 · sagittal · 3.0mm · 0.70mm/px · 3 of 17 slices shown (2 of 3)]
[im 1/17]
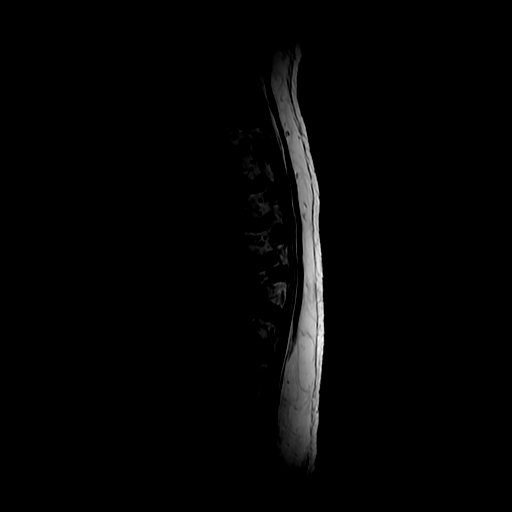
[im 9/17]
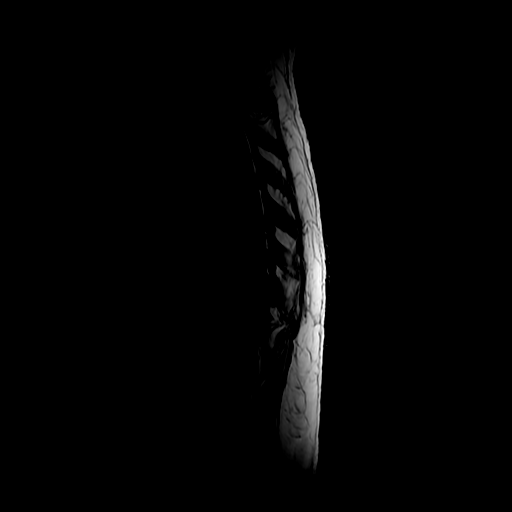
[im 17/17]
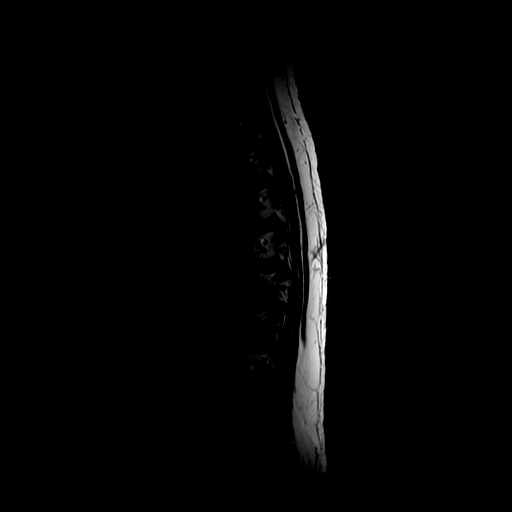

[Series 7: T2 · axial · 4.0mm · 0.39mm/px · z∈[-380,-116]mm · 7 of 43 slices shown (2 of 2)]
[im 1/43]
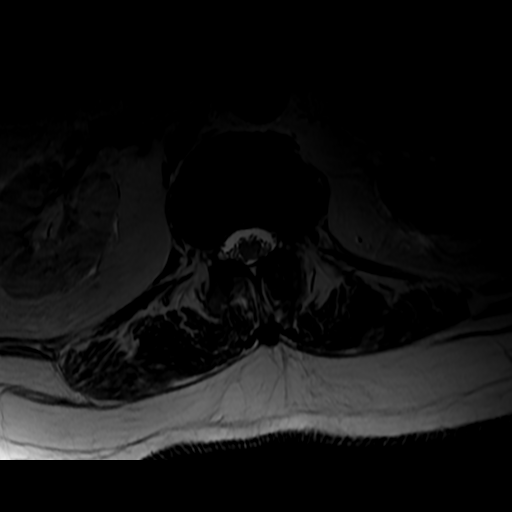
[im 8/43]
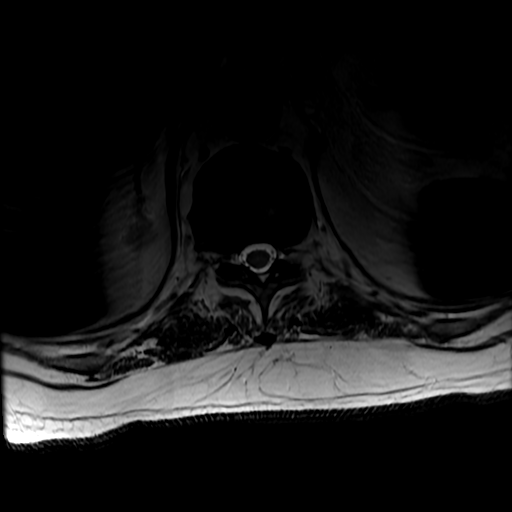
[im 15/43]
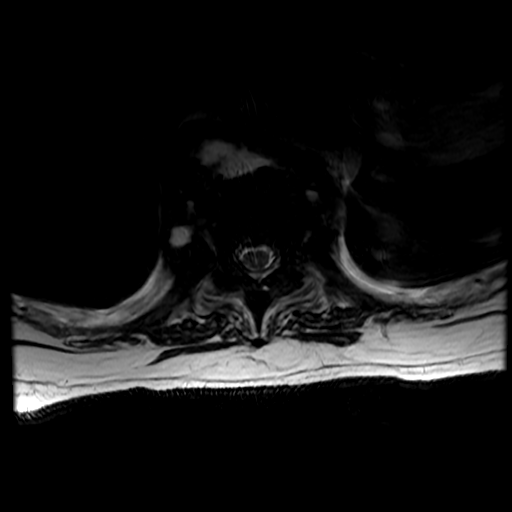
[im 22/43]
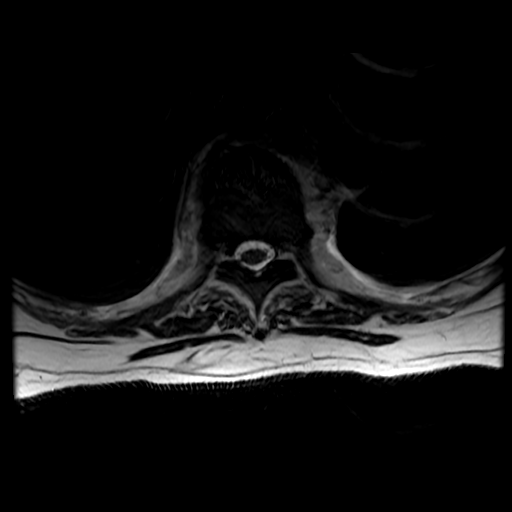
[im 29/43]
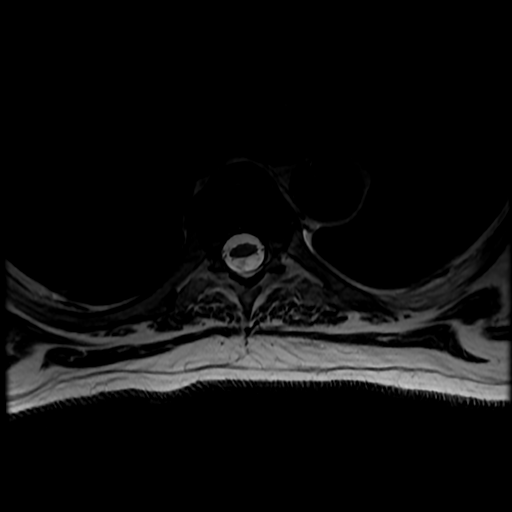
[im 36/43]
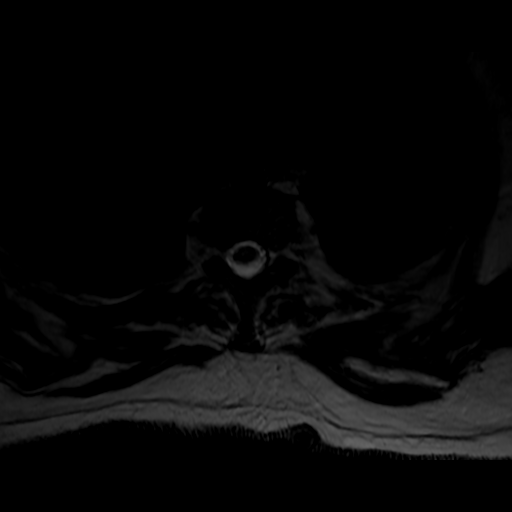
[im 43/43]
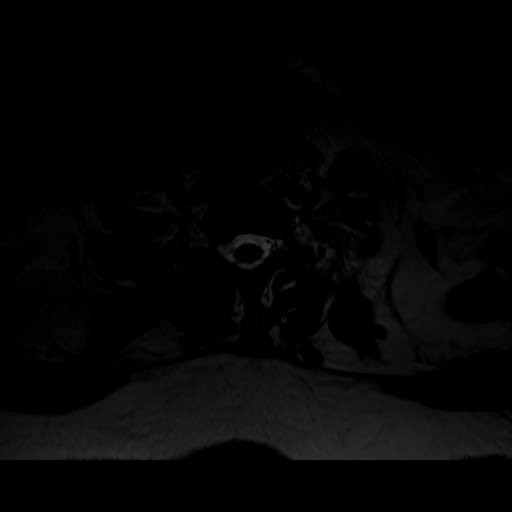

[Series 9: T1 · axial · non-contrast · 3.0mm · 0.39mm/px · z∈[-382,-254]mm · 4 of 70 slices shown (3 of 3)]
[im 1/70]
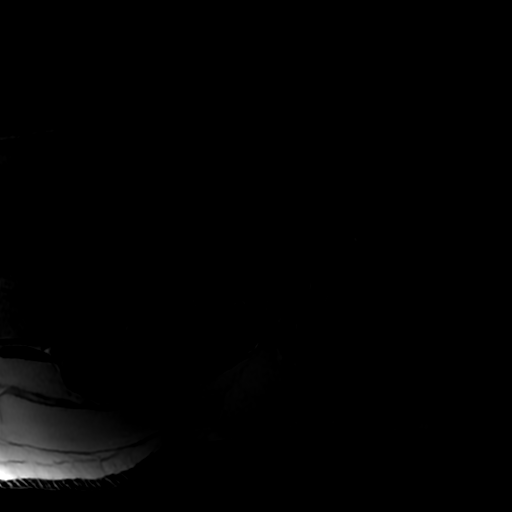
[im 14/70]
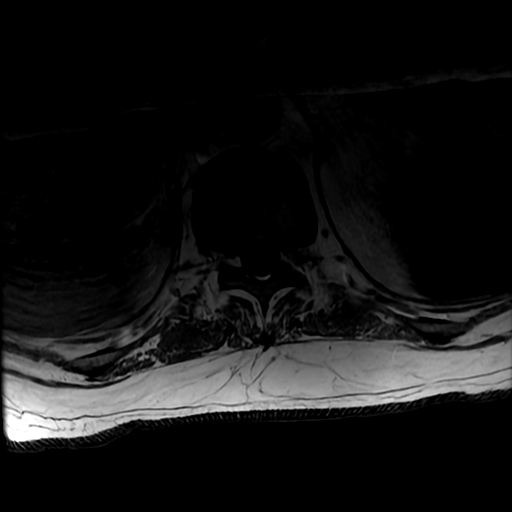
[im 21/70]
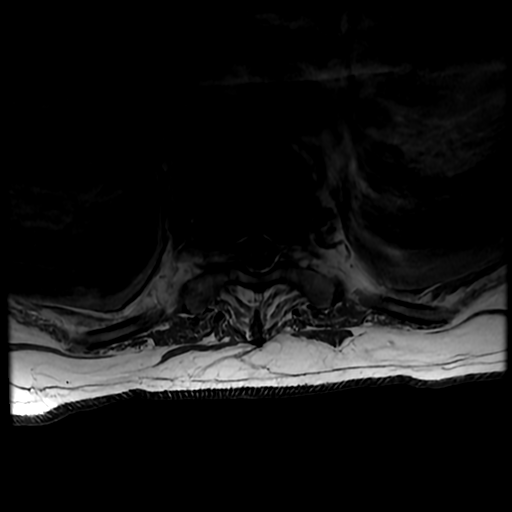
[im 28/70]
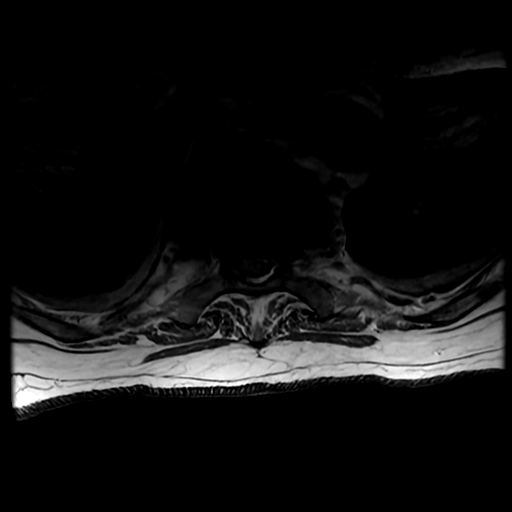

[18 of 48 positions shown; findings below may reference images not displayed]

FINDINGS: Alignment:  Normal

Vertebrae: Discitis osteomyelitis at T8-9. Progressive bony erosion
involving the inferior endplate of T8 and superior endplate of T9
compared with the prior MRI. Endplate erosion similar to the prior
CT. Nonenhancing fluid is seen in the disc space extending into the
paraspinous tissues compatible with infected fluid.

No new areas of osteomyelitis in the thoracic spine

Cord: Progressive spinal stenosis the T8-9 with moderate stenosis
and cord flattening. Progressive retropulsion of bone and soft
tissue into the ventral epidural space at T8-9. Some of this could
be infected fluid. Majority of this does enhance homogeneously. Bone
fragment noted in this area on CT. Mild cord hyperintensity is
present at this level. This has progressed in the interval.

Paraspinal and other soft tissues: Paraspinous soft tissue
thickening diffusely around the T8-9 disc space. Multiple small
abscesses are present, similar to the prior study. Small pleural
effusions bilaterally.

Disc levels:

Discitis osteomyelitis at T8-9. Progressive ventral epidural
enhancing tissue with progressive spinal stenosis. Mild cord
hyperintensity is new. Posterior epidural soft tissue thickening is
similar.

No other levels of spinal stenosis in the thoracic spine. Small
right-sided disc protrusion at T5-6 unchanged.
IMPRESSION: Discitis and osteomyelitis at T8-9. Progressive bony erosion since
the prior MRI of [DATE] but no change from the CT of [DATE].
Progressive spinal stenosis with moderate spinal stenosis, cord
compression, and cord hyperintensity which has developed in the
interval.

Increased fluid in the disc space compatible with infection.
Paraspinous soft tissue thickening and multiple small paraspinous
soft tissue abscesses similar to the prior study

## 2021-07-10 MED ORDER — POTASSIUM CHLORIDE 20 MEQ PO PACK
40.0000 meq | PACK | Freq: Once | ORAL | Status: DC
  Filled 2021-07-10 (×2): qty 2

## 2021-07-10 MED ORDER — GADOBUTROL 1 MMOL/ML IV SOLN
10.0000 mL | Freq: Once | INTRAVENOUS | Status: AC | PRN
  Administered 2021-07-10: 10 mL via INTRAVENOUS

## 2021-07-10 MED ORDER — POTASSIUM CHLORIDE CRYS ER 20 MEQ PO TBCR: 40.0000 meq | EXTENDED_RELEASE_TABLET | Freq: Once | ORAL | Status: DC

## 2021-07-10 MED ORDER — LORAZEPAM 2 MG/ML IJ SOLN: 1.0000 mg | Freq: Once | INTRAMUSCULAR | Status: DC | PRN

## 2021-07-10 NOTE — Progress Notes (Signed)
?                                  PROGRESS NOTE                                             ?                                                                                                                     ?                                         ? ? Patient Demographics:  ? ? Michael [name], is a 61 y.o. male, DOB - [birth year], ZHG:[id number] ? ?Outpatient Primary MD for the patient is Michael [name], [name]    LOS - 1  Admit date - 07/09/2021   ? ?CC - back pain.    ? ?Brief Narrative (HPI from H&P)  61 y/o man with recent h/o discitis/osteomyelitis T8-9 with initial admission 3/17-3/22/23 when he was discharged on medical management with oral abx. Second admission 4/12-07/02/21 for progressive discititis and osteomyelitis. He underwent aspiration of fluid with bone bx T8-9 06/13/21. He was seen by neurosurgery and ID. He was discharged on medical management completing Rocephin 06/28/30 and switched to cefadroxil, since then he continued to have back discomfort, multiple ER visits, last ER visit at [facility] on 07/09/2021 CT at [facility] suggestive of progressive osteomyelitis T8-T9 with bone erosion.  He also had questionable paravertebral phlegmon with possible abscess, he has been placed on IV antibiotics.  Neurosurgeon Dr. Conchita Paris was consulted over the phone who requested to be transferred to [facility]. ? ? Subjective:  ? ? Michael [name] today has, No headache, No chest pain, No abdominal pain - No Nausea, No new weakness tingling or numbness, no cough - SOB, +ve low back pain. ? ? Assessment  & Plan :  ? ? ?Osteomyelitis of thoracic region -  Persistent and worsening osteomyelitis T8-9 now with phlegmon and question of abscesses, septic facet joints after two hospitalizations and multiple rounds of IV and oral abx. Increasing pain.  Currently on vancomycin and Zosyn, inflammatory markers stable, continue pain control, PT OT, have requested neurosurgery to evaluate.   Repeat MRI on 07/10/2021. ? ? ?Chronic nausea and vomiting - Intermittent recurring problem. GI saw patient during April admission. Patient declined EGD, IV PPI and supportive care.  He is also very noncompliant with medications.  Counseled ? ?Poor dentition - Long standing problem. No visible infections.  ? ?Patient with oral candidiasis - Nystatin swish and spit ? ?Essential hypertension - blood pressure soft continue on as needed hydralazine only for now. ? ?Obesity .  BMI  of 38.  Follow with PCP for weight loss. ?  ? ?   ? ?Condition - Extremely Guarded ? ?Family Communication  :  None present ? ?Code Status :  Full ? ?Consults  :  NS ? ?PUD Prophylaxis : PPI ? ? Procedures  :    ? ?MRI T Spine -  ? ?   ? ?Disposition Plan  :   ? ?Status is: Inpatient ? ?DVT Prophylaxis  :   ? ?heparin injection 5,000 Units Start: 07/09/21 2200 ?  ? ?Lab Results  ?Component Value Date  ? PLT 369 07/10/2021  ? ? ?Diet :  ?Diet Order   ? ?       ?  Diet Heart Room service appropriate? Yes; Fluid consistency: Thin  Diet effective now       ?  ? ?  ?  ? ?  ?  ? ?Inpatient Medications ? ?Scheduled Meds: ? docusate sodium  100 mg Oral BID  ? gabapentin  300 mg Oral TID  ? heparin  5,000 Units Subcutaneous Q8H  ? hydrochlorothiazide  25 mg Oral Daily  ? lisinopril  5 mg Oral Daily  ? nystatin  5 mL Mouth/Throat QID  ? pantoprazole (PROTONIX) IV  40 mg Intravenous Q12H  ? polyethylene glycol  17 g Oral Daily  ? potassium chloride  40 mEq Oral Once  ? ?Continuous Infusions: ? sodium chloride Stopped (07/10/21 1025)  ? piperacillin-tazobactam (ZOSYN)  IV 3.375 g (07/10/21 8527)  ? vancomycin    ? ?PRN Meds:.acetaminophen **OR** acetaminophen, morphine injection, traZODone ? ?Antibiotics  :   ? ?Anti-infectives (From admission, onward)  ? ? Start     Dose/Rate Route Frequency Ordered Stop  ? 07/10/21 2300  vancomycin (VANCOREADY) IVPB 1250 mg/250 mL       ? 1,250 mg ?166.7 mL/hr over 90 Minutes Intravenous Every 12 hours 07/09/21 2249    ?  07/09/21 2230  piperacillin-tazobactam (ZOSYN) IVPB 3.375 g       ? 3.375 g ?12.5 mL/hr over 240 Minutes Intravenous Every 8 hours 07/09/21 2137    ? ?  ? ? ? Time Spent in minutes  30 ? ? ?Susa Raring M.D on 07/10/2021 at 12:01 PM ? ?To page go to [email or web address]  ? ?[facility] -  Office  [phone] ? ?See all Orders from today for further details ? ? ? Objective:  ? ?Vitals:  ? 07/10/21 0400 07/10/21 0809 07/10/21 0900 07/10/21 1140  ?BP: 100/70 100/70  116/85  ?Pulse: 82 93 91 92  ?Resp: 13 13 14 20   ?Temp: 98.4 ?F (36.9 ?C) 97.6 ?F (36.4 ?C)  97.6 ?F (36.4 ?C)  ?TempSrc: Oral Oral  Oral  ?SpO2: 96% 97% 94% 95%  ?Weight:      ?Height:      ? ? ?Wt Readings from Last 3 Encounters:  ?07/09/21 125.9 kg  ?07/02/21 125.9 kg  ?05/29/21 (!) 149.2 kg  ? ? ? ?Intake/Output Summary (Last 24 hours) at 07/10/2021 1201 ?Last data filed at 07/10/2021 0631 ?Gross per 24 hour  ?Intake 316.03 ml  ?Output --  ?Net 316.03 ml  ? ? ? ?Physical Exam ? ?Awake Alert, No new F.N deficits, Normal affect ?[place].AT,PERRAL ?Supple Neck, No JVD,   ?Symmetrical Chest wall movement, Good air movement bilaterally, CTAB ?RRR,No Gallops,Rubs or new Murmurs,  ?+ve B.Sounds, Abd Soft, No tenderness,   ?No Cyanosis, +ve T spine pain, SLR +ve L>R ?  ? ?RN pressure injury documentation: ?Pressure Injury 06/22/21  Coccyx Mid Stage 2 -  Partial thickness loss of dermis presenting as a shallow open injury with a red, pink wound bed without slough. broken skin blanching (Active)  ?06/22/21 1730  ?Location: Coccyx  ?Location Orientation: Mid  ?Staging: Stage 2 -  Partial thickness loss of dermis presenting as a shallow open injury with a red, pink wound bed without slough.  ?Wound Description (Comments): broken skin blanching  ?Present on Admission: No  ?Dressing Type Foam - Lift dressing to assess site every shift 07/02/21 0900  ? ? ? Data Review:  ? ? ?CBC ?Recent Labs  ?Lab 07/09/21 ?2143 07/10/21 ?0103  ?WBC 10.8* 11.9*  ?HGB 12.7* 11.9*  ?HCT 38.8*  37.3*  ?PLT 364 369  ?MCV 90.9 92.3  ?MCH 29.7 29.5  ?MCHC 32.7 31.9  ?RDW 16.5* 16.6*  ? ? ?Electrolytes ?Recent Labs  ?Lab 07/09/21 ?2143 07/10/21 ?0103 07/10/21 ?[id number]  ?NA  --  136  --   ?K  --  3.2*  --   ?CL  --  104  --   ?CO2  --  22  --   ?GLUCOSE  --  110*  --   ?BUN  --  9  --   ?CREATININE 0.97 1.00  --   ?CALCIUM  --  9.0  --   ?CRP  --   --  1.7*  ?PROCALCITON  --   --  <0.10  ?HGBA1C  --  6.1*  --   ? ? ?------------------------------------------------------------------------------------------------------------------ ?No results for input(s): CHOL, HDL, LDLCALC, TRIG, CHOLHDL, LDLDIRECT in the last 72 hours. ? ?Lab Results  ?Component Value Date  ? HGBA1C 6.1 (H) 07/10/2021  ? ? ?No results for input(s): TSH, T4TOTAL, T3FREE, THYROIDAB in the last 72 hours. ? ?Invalid input(s): FREET3 ?------------------------------------------------------------------------------------------------------------------ ?ID Labs ?Recent Labs  ?Lab 07/09/21 ?2143 07/10/21 ?0103 07/10/21 ?[id number]  ?WBC 10.8* 11.9*  --   ?PLT 364 369  --   ?CRP  --   --  1.7*  ?PROCALCITON  --   --  <0.10  ?CREATININE 0.97 1.00  --   ? ?Cardiac Enzymes ?No results for input(s): CKMB, TROPONINI, MYOGLOBIN in the last 168 hours. ? ?Invalid input(s): CK ? ?Micro Results ?Recent Results (from the past 240 hour(s))  ?MRSA Next Gen by PCR, Nasal     Status: None  ? Collection Time: 07/10/21  6:45 AM  ? Specimen: Nasal Mucosa; Nasal Swab  ?Result Value Ref Range Status  ? MRSA by PCR Next Gen NOT DETECTED NOT DETECTED Final  ?  Comment: (NOTE) ?The GeneXpert MRSA Assay (FDA approved for NASAL specimens only), ?is one component of a comprehensive MRSA colonization surveillance ?program. It is not intended to diagnose MRSA infection nor to guide ?or monitor treatment for MRSA infections. ?Test performance is not FDA approved in patients less than 2 years ?old. ?Performed at [facility], [address] [place], [place] ?[id number] ?  ? ? ?Radiology  Reports ?No results found.  ? ? ?

## 2021-07-10 NOTE — Evaluation (Signed)
Physical Therapy Evaluation ?Patient Details ?Name: Michael PlumberJohn R [name] ?MRN: [id number] ?DOB: Aug 12, 1960 ?Today's Date: 07/10/2021 ? ?History of Present Illness ? 61 y/o man with recent h/o discitis/osteomyelitis T8-9 with initial admission 3/17-3/22/23. Second admission 4/12-07/02/21 for progressive discititis and osteomyelitis. He underwent aspiration of fluid with bone bx T8-9 06/13/21. Adm 07/09/21 for worsening pain. CT refealed progressive osteomyelitis/discititis at T8-9 compared to recent studies with bony erosion and ?abscesses. Neurosurgery consult pending  ?Clinical Impression ?  ?Pt admitted secondary to problem above with deficits below. PTA patient was discharged to SNF where he was walking short distances with RW and assist. He could only tolerate sitting for 30 minutes per his report.  Pt currently requires min assist to come to sit at EOB and then refuses to attempt standing. He reports pain 9/10, however no increase in vital signs and remains with flat affect. Denies need for pain medication as he "tries to avoid using it." Patient appears to be self-limiting. Anticipate patient will benefit from PT to address problems listed below.Will continue to follow acutely to maximize functional mobility independence and safety.   ?   ?   ? ?Recommendations for follow up therapy are one component of a multi-disciplinary discharge planning process, led by the attending physician.  Recommendations may be updated based on patient status, additional functional criteria and insurance authorization. ? ?Follow Up Recommendations Skilled nursing-short term rehab (<3 hours/day) ? ?  ?Assistance Recommended at Discharge Intermittent Supervision/Assistance  ?Patient can return home with the following ? A lot of help with walking and/or transfers;Assistance with cooking/housework;Assist for transportation;Help with stairs or ramp for entrance ? ?  ?Equipment Recommendations None recommended by PT  ?Recommendations for Other  Services ?    ?  ?Functional Status Assessment Patient has had a recent decline in their functional status and demonstrates the ability to make significant improvements in function in a reasonable and predictable amount of time.  ? ?  ?Precautions / Restrictions Precautions ?Precautions: Fall;Other (comment) ?Precaution Comments: ?back precautions for comfort  ? ?  ? ?Mobility ? Bed Mobility ?Overal bed mobility: Needs Assistance ?Bed Mobility: Rolling, Sidelying to Sit, Sit to Sidelying ?Rolling: Supervision (with rail) ?Sidelying to sit: Min assist (with rail) ?  ?  ?Sit to sidelying: Supervision ?General bed mobility comments: requires max encouragement to come to sit at EOB; sat ~3 minutes, returned to sidelying requesting help with legs but able to easily lift them onto the bed ?  ? ?Transfers ?  ?  ?  ?  ?  ?  ?  ?  ?  ?General transfer comment: patient refused to attempt ?  ? ?Ambulation/Gait ?  ?  ?  ?  ?  ?  ?  ?  ? ?Stairs ?  ?  ?  ?  ?  ? ?Wheelchair Mobility ?  ? ?Modified Rankin (Stroke Patients Only) ?  ? ?  ? ?Balance Overall balance assessment: Needs assistance ?Sitting-balance support: No upper extremity supported, Feet supported ?Sitting balance-Leahy Scale: Fair ?Sitting balance - Comments: >fair NT ?  ?  ?  ?  ?  ?  ?  ?  ?  ?  ?  ?  ?  ?  ?  ?   ? ? ? ?Pertinent Vitals/Pain Pain Assessment ?Pain Assessment: 0-10 ?Pain Score: 9  ?Pain Location: right posterior back; abdomen in sitting ?Pain Descriptors / Indicators: Discomfort ?Pain Intervention(s): Limited activity within patient's tolerance, Monitored during session, Repositioned  ? ? ?Home Living Family/patient expects  to be discharged to:: Skilled nursing facility ?Living Arrangements: Spouse/significant other ?Available Help at Discharge: Family;Available PRN/intermittently ?Type of Home: House ?Home Access: Stairs to enter ?Entrance Stairs-Rails: Can reach both ?Entrance Stairs-Number of Steps: 7 ?  ?Home Layout: One level ?Home Equipment:  Agricultural consultant (2 wheels);Shower seat - built in ?   ?  ?Prior Function Prior Level of Function : Independent/Modified Independent;Working/employed;Driving ?  ?  ?  ?  ?  ?  ?Mobility Comments: independent prior to onset of osteomyelitis; walking short distance at SNF with RW and sitting for 30 minutes ?ADLs Comments: prior to hospitalization, wife was assisting alot but due to self limitation ?  ? ? ?Hand Dominance  ? Dominant Hand: Left ? ?  ?Extremity/Trunk Assessment  ? Upper Extremity Assessment ?Upper Extremity Assessment: Defer to OT evaluation ?  ? ?Lower Extremity Assessment ?Lower Extremity Assessment: Generalized weakness (can lift both legs against gravity) ?  ? ?Cervical / Trunk Assessment ?Cervical / Trunk Assessment: Other exceptions ?Cervical / Trunk Exceptions: obese; back pain  ?Communication  ? Communication: No difficulties  ?Cognition Arousal/Alertness: Awake/alert ?Behavior During Therapy: Flat affect ?Overall Cognitive Status: No family/caregiver present to determine baseline cognitive functioning ?  ?  ?  ?  ?  ?  ?  ?  ?  ?  ?  ?  ?  ?  ?  ?  ?General Comments: Pt states understanding of risks of immobility yet refuses to mobilize due to pain. Reports severe pain however moves smoothly and without grimacing ?  ?  ? ?  ?General Comments   ? ?  ?Exercises    ? ?Assessment/Plan  ?  ?PT Assessment Patient needs continued PT services  ?PT Problem List Decreased strength;Decreased activity tolerance;Decreased balance;Decreased mobility;Decreased knowledge of use of DME;Decreased safety awareness;Pain;Obesity ? ?   ?  ?PT Treatment Interventions DME instruction;Gait training;Stair training;Functional mobility training;Therapeutic activities;Therapeutic exercise;Balance training;Patient/family education   ? ?PT Goals (Current goals can be found in the Care Plan section)  ?Acute Rehab PT Goals ?Patient Stated Goal: feel better ?PT Goal Formulation: With patient ?Time For Goal Achievement:  07/24/21 ?Potential to Achieve Goals: Fair ? ?  ?Frequency Min 3X/week ?  ? ? ?Co-evaluation PT/OT/SLP Co-Evaluation/Treatment: Yes ?Reason for Co-Treatment: Other (comment);To address functional/ADL transfers (pt self-limits due to pain) ?  ?  ?  ? ? ?  ?AM-PAC PT "6 Clicks" Mobility  ?Outcome Measure Help needed turning from your back to your side while in a flat bed without using bedrails?: A Little ?Help needed moving from lying on your back to sitting on the side of a flat bed without using bedrails?: A Little ?Help needed moving to and from a bed to a chair (including a wheelchair)?: A Little ?Help needed standing up from a chair using your arms (e.g., wheelchair or bedside chair)?: A Little ?Help needed to walk in hospital room?: Total ?Help needed climbing 3-5 steps with a railing? : Total ?6 Click Score: 14 ? ?  ?End of Session   ?Activity Tolerance: No increased pain;Patient limited by pain ?Patient left: in bed;with call bell/phone within reach ?Nurse Communication: Mobility status ?PT Visit Diagnosis: Pain;Difficulty in walking, not elsewhere classified (R26.2) ?Pain - Right/Left: Right ?Pain - part of body:  (back) ?  ? ?Time: [phone] ?PT Time Calculation (min) (ACUTE ONLY): 28 min ? ? ?Charges:   PT Evaluation ?$PT Eval Low Complexity: 1 Low ?  ?  ?   ? ? ? ?Jerolyn Center, PT ?Acute Rehabilitation  Services  ?Pager [phone] ?Office [phone] ? ? ?Michael [name] ?07/10/2021, 8:43 AM ? ?

## 2021-07-10 NOTE — Evaluation (Signed)
Occupational Therapy Evaluation ?Patient Details ?Name: Michael [name] ?MRN: [id number] ?DOB: [birth year] ?Today's Date: 07/10/2021 ? ? ?History of Present Illness 61 y/o man with recent h/o discitis/osteomyelitis T8-9 with initial admission 3/17-3/22/23. Second admission 4/12-07/02/21 for progressive discititis and osteomyelitis. He underwent aspiration of fluid with bone bx T8-9 06/13/21. Adm 07/09/21 for worsening pain. CT refealed progressive osteomyelitis/discititis at T8-9 compared to recent studies with bony erosion and ?abscesses. Neurosurgery consult pending  ? ?Clinical Impression ?  ?Prior to March, when pt was initially admitted, he was independent, driving and working. At SNF he reports tolerating sitting in chair x 30 minutes and ambulating short distances with a RW. He was assisted for bathing, dressing and toileting. Pt with history of self limiting behavior, but does report back pain on 9/10. He required maximum encouragement to demonstrate bed mobility and sitting balance and refused to attempt to stand. Pt needs set up to max assist for ADLs. Recommend return to SNF when medically ready for further rehab.  ?   ? ?Recommendations for follow up therapy are one component of a multi-disciplinary discharge planning process, led by the attending physician.  Recommendations may be updated based on patient status, additional functional criteria and insurance authorization.  ? ?Follow Up Recommendations ? Skilled nursing-short term rehab (<3 hours/day)  ?  ?Assistance Recommended at Discharge Frequent or constant Supervision/Assistance  ?Patient can return home with the following A lot of help with walking and/or transfers;A lot of help with bathing/dressing/bathroom;Assistance with cooking/housework;Assist for transportation;Help with stairs or ramp for entrance ? ?  ?Functional Status Assessment ? Patient has had a recent decline in their functional status and demonstrates the ability to make significant  improvements in function in a reasonable and predictable amount of time.  ?Equipment Recommendations ? BSC/3in1  ?  ?Recommendations for Other Services   ? ? ?  ?Precautions / Restrictions Precautions ?Precautions: Fall;Other (comment) ?Precaution Comments: ?back precautions for comfort ?Restrictions ?Weight Bearing Restrictions: No  ? ?  ? ?Mobility Bed Mobility ?Overal bed mobility: Needs Assistance ?Bed Mobility: Rolling, Sidelying to Sit, Sit to Sidelying ?Rolling: Supervision ?Sidelying to sit: Min assist ?  ?  ?Sit to sidelying: Supervision ?General bed mobility comments: requires max encouragement to come to sit at EOB; sat ~3 minutes, returned to sidelying requesting help with legs but able to easily lift them onto the bed ?  ? ?Transfers ?  ?  ?  ?  ?  ?  ?  ?  ?  ?General transfer comment: patient refused to attempt ?  ? ?  ?Balance Overall balance assessment: Needs assistance ?Sitting-balance support: No upper extremity supported, Feet supported ?Sitting balance-Leahy Scale: Fair ?  ?  ?  ?  ?  ?  ?  ?  ?  ?  ?  ?  ?  ?  ?  ?  ?   ? ?ADL either performed or assessed with clinical judgement  ? ?ADL Overall ADL's : Needs assistance/impaired ?Eating/Feeding: Independent;Bed level ?  ?Grooming: Supervision/safety;Sitting ?  ?Upper Body Bathing: Moderate assistance;Sitting ?  ?Lower Body Bathing: Maximal assistance;Sitting/lateral leans ?  ?Upper Body Dressing : Set up;Sitting ?  ?Lower Body Dressing: Maximal assistance;Sitting/lateral leans ?  ?  ?  ?  ?  ?  ?  ?  ?General ADL Comments: limited to EOB despite maximum encouragement  ? ? ? ?Vision Baseline Vision/History: 1 Wears glasses ?Ability to See in Adequate Light: 0 Adequate ?Patient Visual Report: No change from baseline ?   ?   ?  Perception   ?  ?Praxis   ?  ? ?Pertinent Vitals/Pain Pain Assessment ?Pain Assessment: 0-10 ?Pain Score: 9  ?Pain Location: back ?Pain Descriptors / Indicators: Discomfort ?Pain Intervention(s): Monitored during session,  Repositioned  ? ? ? ?Hand Dominance Left ?  ?Extremity/Trunk Assessment Upper Extremity Assessment ?Upper Extremity Assessment: Overall WFL for tasks assessed ?  ?Lower Extremity Assessment ?Lower Extremity Assessment: Defer to PT evaluation ?  ?Cervical / Trunk Assessment ?Cervical / Trunk Assessment: Other exceptions ?Cervical / Trunk Exceptions: obese; back pain ?  ?Communication Communication ?Communication: No difficulties ?  ?Cognition Arousal/Alertness: Awake/alert ?Behavior During Therapy: Flat affect ?Overall Cognitive Status: No family/caregiver present to determine baseline cognitive functioning ?  ?  ?  ?  ?  ?  ?  ?  ?  ?  ?  ?  ?  ?  ?  ?  ?General Comments: Pt states understanding of risks of immobility yet refuses to mobilize due to pain. Reports severe pain however moves smoothly and without grimacing ?  ?  ?General Comments    ? ?  ?Exercises   ?  ?Shoulder Instructions    ? ? ?Home Living Family/patient expects to be discharged to:: Skilled nursing facility ?Living Arrangements: Spouse/significant other ?Available Help at Discharge: Family;Available PRN/intermittently ?Type of Home: House ?Home Access: Stairs to enter ?Entrance Stairs-Number of Steps: 7 ?Entrance Stairs-Rails: Can reach both ?Home Layout: One level ?  ?  ?Bathroom Shower/Tub: Psychologist, counsellingWalk-in shower;Tub/shower unit ?  ?Bathroom Toilet: Standard ?Bathroom Accessibility: Yes ?How Accessible: Accessible via walker ?Home Equipment: Agricultural consultantolling Walker (2 wheels);Shower seat - built in ?  ?  ?  ? ?  ?Prior Functioning/Environment Prior Level of Function : Independent/Modified Independent;Working/employed;Driving ?  ?  ?  ?  ?  ?  ?Mobility Comments: independent prior to onset of osteomyelitis; walking short distance at SNF with RW and sitting for 30 minutes ?ADLs Comments: prior to hospitalization, wife was assisting alot but due to self limitation ?  ? ?  ?  ?OT Problem List: Decreased activity tolerance;Obesity;Pain ?  ?   ?OT  Treatment/Interventions: Self-care/ADL training;DME and/or AE instruction;Therapeutic activities;Patient/family education;Balance training;Cognitive remediation/compensation  ?  ?OT Goals(Current goals can be found in the care plan section) Acute Rehab OT Goals ?OT Goal Formulation: With patient ?Time For Goal Achievement: 07/24/21 ?Potential to Achieve Goals: Fair ?ADL Goals ?Pt Will Perform Grooming: with min assist;standing ?Pt Will Perform Upper Body Dressing: with set-up;standing ?Pt Will Perform Lower Body Dressing: with min assist;sit to/from stand ?Pt Will Transfer to Toilet: with min guard assist;ambulating;bedside commode ?Pt Will Perform Toileting - Clothing Manipulation and hygiene: with min assist;sit to/from stand ?Additional ADL Goal #1: Pt will perform bed mobility modified independently.  ?OT Frequency: Min 2X/week ?  ? ?Co-evaluation PT/OT/SLP Co-Evaluation/Treatment: Yes ?Reason for Co-Treatment:  (pt is self limiting) ?  ?OT goals addressed during session: ADL's and self-care ?  ? ?  ?AM-PAC OT "6 Clicks" Daily Activity     ?Outcome Measure Help from another person eating meals?: None ?Help from another person taking care of personal grooming?: A Little ?Help from another person toileting, which includes using toliet, bedpan, or urinal?: A Lot ?Help from another person bathing (including washing, rinsing, drying)?: A Lot ?Help from another person to put on and taking off regular upper body clothing?: A Little ?Help from another person to put on and taking off regular lower body clothing?: A Lot ?6 Click Score: 16 ?  ?End of Session   ? ?Activity  Tolerance: Other (comment) (self limiting) ?Patient left: in bed;with call bell/phone within reach;with bed alarm set ? ?OT Visit Diagnosis: Pain;Other symptoms and signs involving cognitive function  ?              ?Time: [phone] ?OT Time Calculation (min): 27 min ?Charges:  OT General Charges ?$OT Visit: 1 Visit ?OT Evaluation ?$OT Eval Moderate  Complexity: 1 Mod ? ?Martie Round, OTR/L ?Acute Rehabilitation Services ?Pager: [phone] ?Office: [phone]  ?Evern Bio ?07/10/2021, 9:33 AM ?

## 2021-07-10 NOTE — Progress Notes (Signed)
?  Transition of Care (TOC) Screening Note ? ? ?Patient Details  ?Name: Michael [name] ?Date of Birth: [birth year] ? ? ?Transition of Care (TOC) CM/SW Contact:    ?Cyndi Bender, RN ?Phone Number: ?07/10/2021, 8:26 AM ? ? ? ?Transition of Care Department [facility]) has reviewed patient and no TOC needs have been identified at this time. We will continue to monitor patient advancement through interdisciplinary progression rounds. If new patient transition needs arise, please place a TOC consult. ? ? ?

## 2021-07-11 ENCOUNTER — Inpatient Hospital Stay (HOSPITAL_COMMUNITY): Payer: BC Managed Care – PPO

## 2021-07-11 DIAGNOSIS — M4624 Osteomyelitis of vertebra, thoracic region: Secondary | ICD-10-CM | POA: Diagnosis not present

## 2021-07-11 LAB — COMPREHENSIVE METABOLIC PANEL
ALT: 40 U/L (ref 0–44)
AST: 22 U/L (ref 15–41)
Albumin: 2.9 g/dL — ABNORMAL LOW (ref 3.5–5.0)
Alkaline Phosphatase: 96 U/L (ref 38–126)
Anion gap: 10 (ref 5–15)
BUN: 8 mg/dL (ref 6–20)
CO2: 25 mmol/L (ref 22–32)
Calcium: 9.1 mg/dL (ref 8.9–10.3)
Chloride: 102 mmol/L (ref 98–111)
Creatinine, Ser: 1.51 mg/dL — ABNORMAL HIGH (ref 0.61–1.24)
GFR, Estimated: 53 mL/min — ABNORMAL LOW (ref >60)
Glucose, Bld: 116 mg/dL — ABNORMAL HIGH (ref 70–99)
Potassium: 3.4 mmol/L — ABNORMAL LOW (ref 3.5–5.1)
Sodium: 137 mmol/L (ref 135–145)
Total Bilirubin: 1 mg/dL (ref 0.3–1.2)
Total Protein: 7 g/dL (ref 6.5–8.1)

## 2021-07-11 LAB — CBC WITH DIFFERENTIAL/PLATELET
Abs Immature Granulocytes: 0.36 10*3/uL — ABNORMAL HIGH (ref 0.00–0.07)
Basophils Absolute: 0.1 10*3/uL (ref 0.0–0.1)
Basophils Relative: 1 %
Eosinophils Absolute: 0.2 10*3/uL (ref 0.0–0.5)
Eosinophils Relative: 2 %
HCT: 38.5 % — ABNORMAL LOW (ref 39.0–52.0)
Hemoglobin: 12.8 g/dL — ABNORMAL LOW (ref 13.0–17.0)
Immature Granulocytes: 3 %
Lymphocytes Relative: 22 %
Lymphs Abs: 2.5 10*3/uL (ref 0.7–4.0)
MCH: 30.1 pg (ref 26.0–34.0)
MCHC: 33.2 g/dL (ref 30.0–36.0)
MCV: 90.6 fL (ref 80.0–100.0)
Monocytes Absolute: 1 10*3/uL (ref 0.1–1.0)
Monocytes Relative: 9 %
Neutro Abs: 7 10*3/uL (ref 1.7–7.7)
Neutrophils Relative %: 63 %
Platelets: 355 10*3/uL (ref 150–400)
RBC: 4.25 MIL/uL (ref 4.22–5.81)
RDW: 16.6 % — ABNORMAL HIGH (ref 11.5–15.5)
WBC: 11.2 10*3/uL — ABNORMAL HIGH (ref 4.0–10.5)
nRBC: 0 % (ref 0.0–0.2)

## 2021-07-11 LAB — SEDIMENTATION RATE: Sed Rate: 49 mm/hr — ABNORMAL HIGH (ref 0–16)

## 2021-07-11 LAB — MAGNESIUM: Magnesium: 2.2 mg/dL (ref 1.7–2.4)

## 2021-07-11 LAB — PROCALCITONIN: Procalcitonin: <0.1 ng/mL

## 2021-07-11 LAB — CK: Total CK: 11 U/L — ABNORMAL LOW (ref 49–397)

## 2021-07-11 LAB — BRAIN NATRIURETIC PEPTIDE: B Natriuretic Peptide: 15 pg/mL (ref 0.0–100.0)

## 2021-07-11 LAB — C-REACTIVE PROTEIN: CRP: 1.7 mg/dL — ABNORMAL HIGH (ref <1.0)

## 2021-07-11 LAB — OSMOLALITY: Osmolality: 294 mOsm/kg (ref 275–295)

## 2021-07-11 IMAGING — US US RENAL
1 series · 14 of 25 positions shown · non-contrast
Comparison: None Available.

CLINICAL DATA: Acute kidney injury.

EXAM:
RENAL / URINARY TRACT ULTRASOUND COMPLETE

[Series 1: us renal · 14 of 42 slices shown]
[im 1/42]
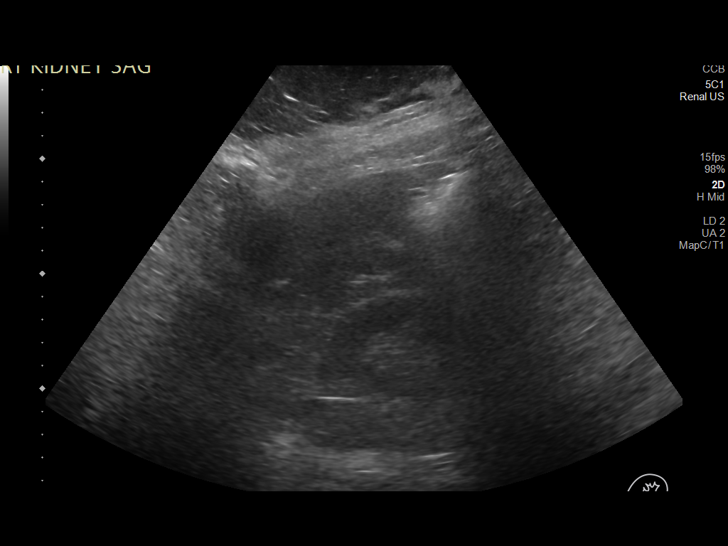
[im 4/42]
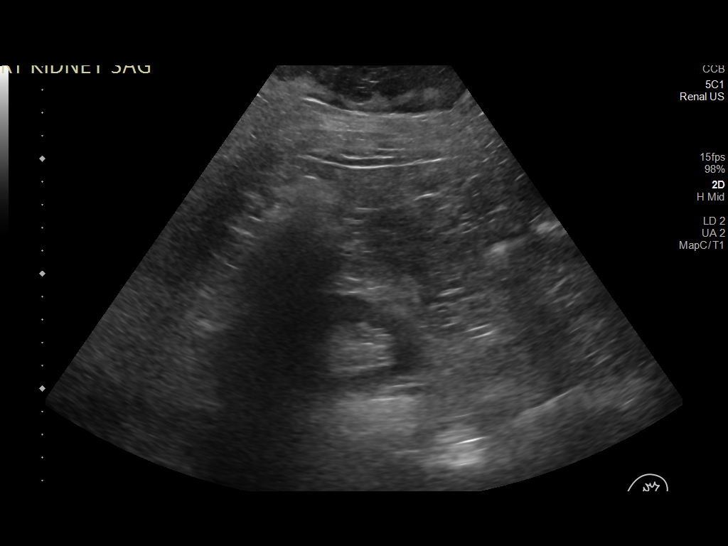
[im 7/42]
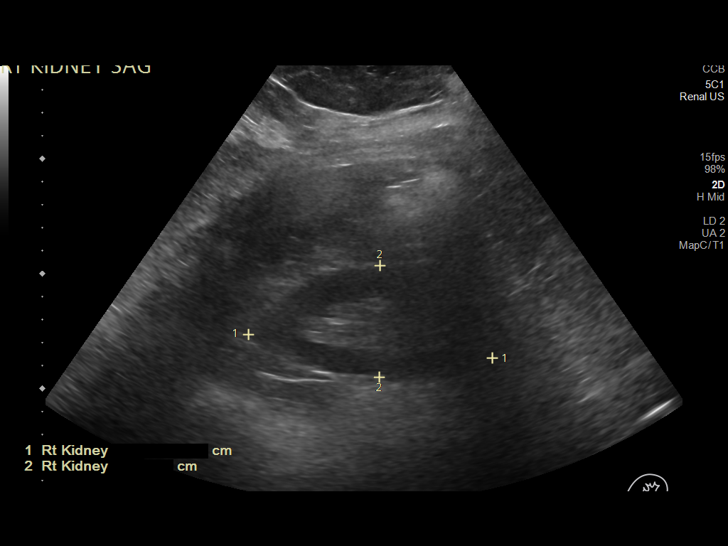
[im 11/42]
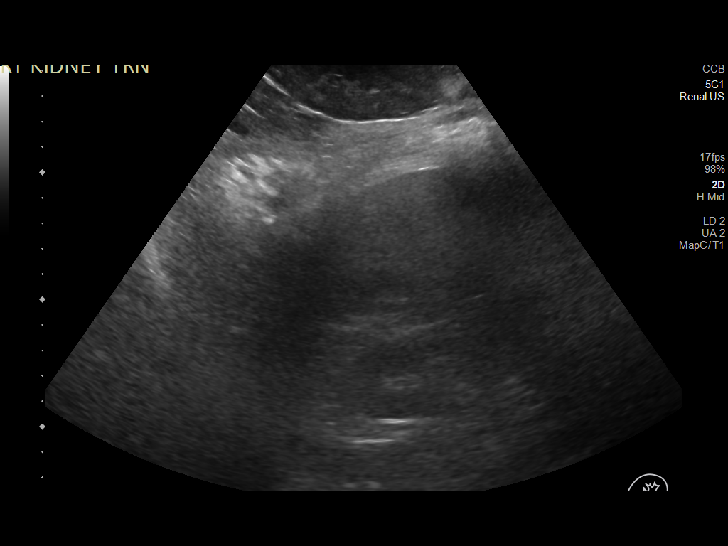
[im 14/42]
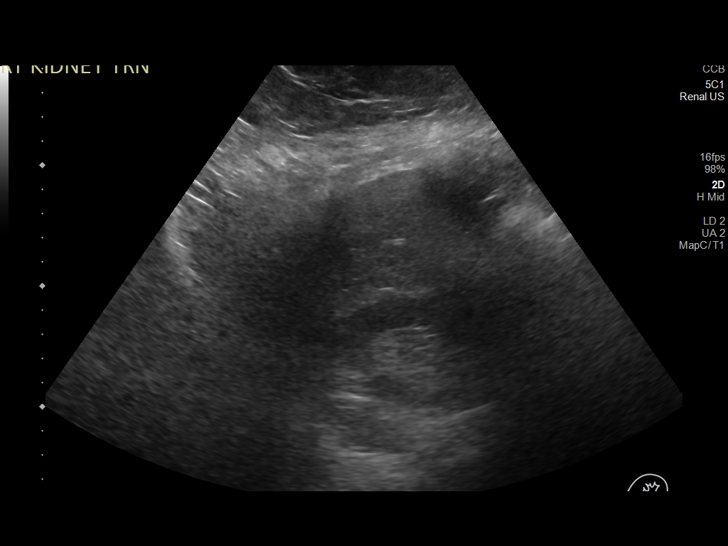
[im 16/42]
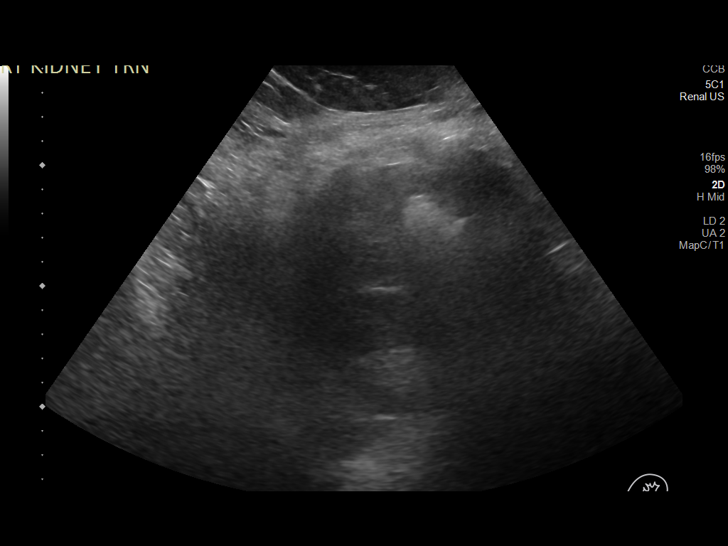
[im 19/42]
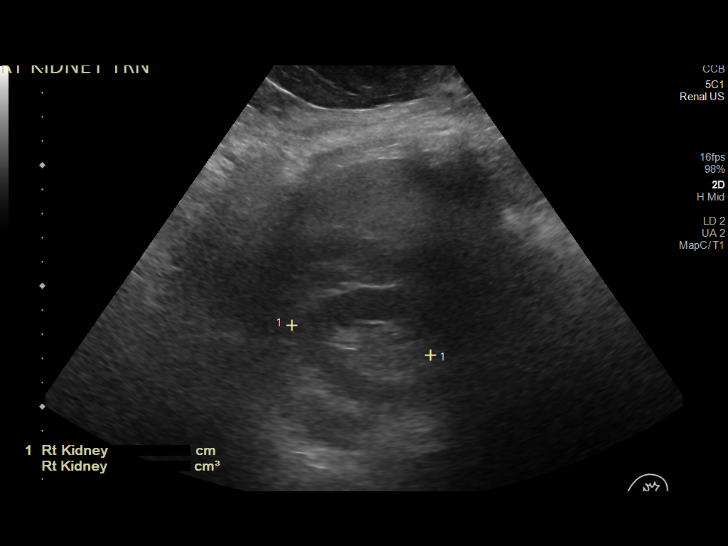
[im 23/42]
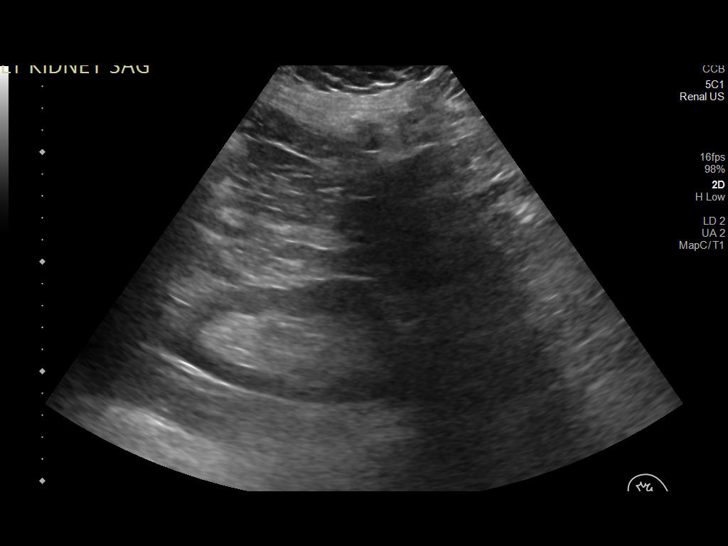
[im 26/42]
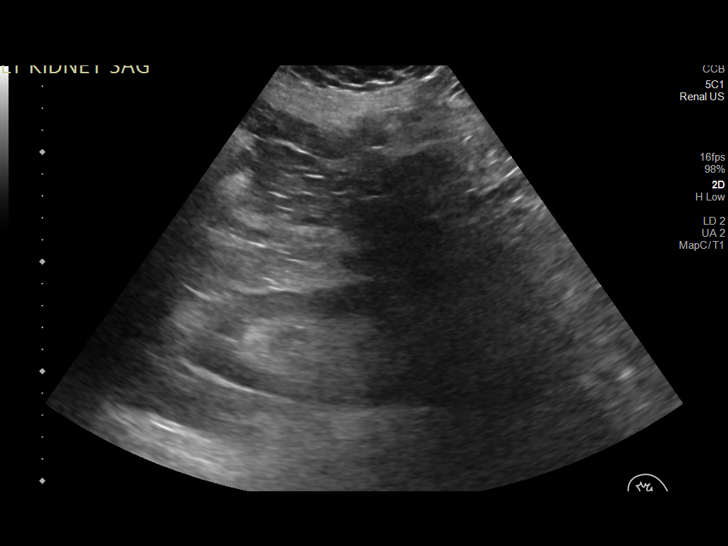
[im 28/42]
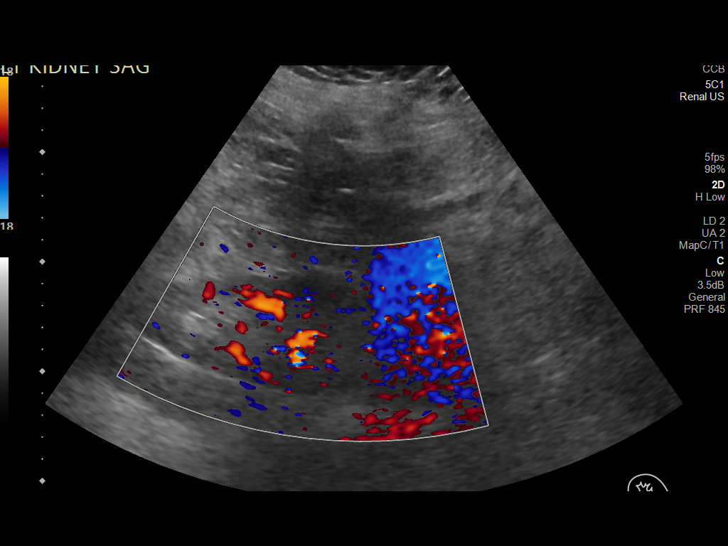
[im 31/42]
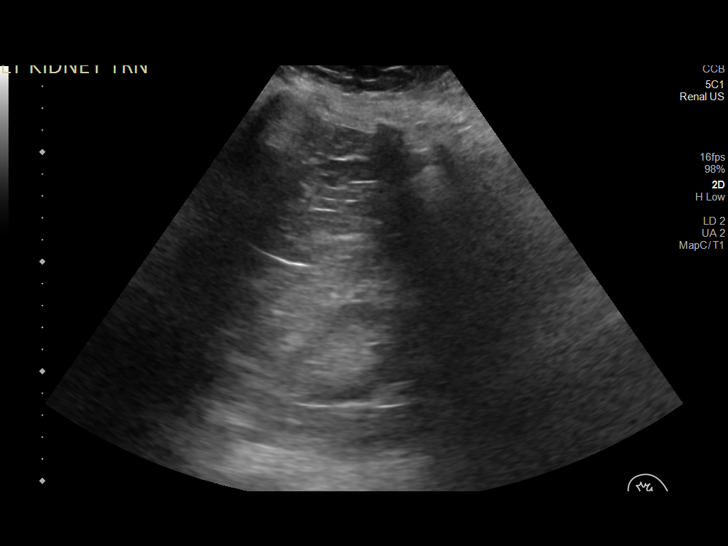
[im 35/42]
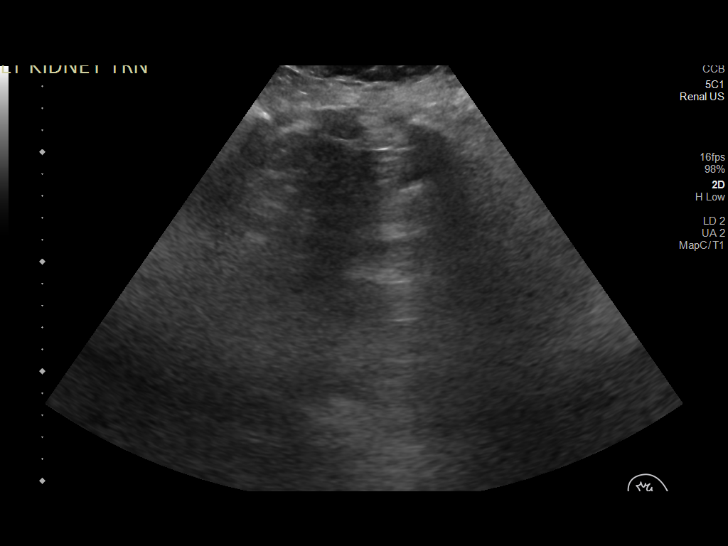
[im 38/42]
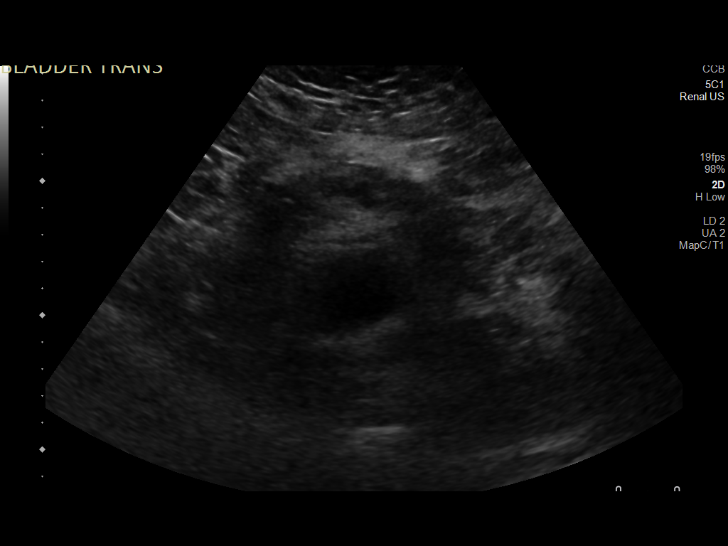
[im 42/42]
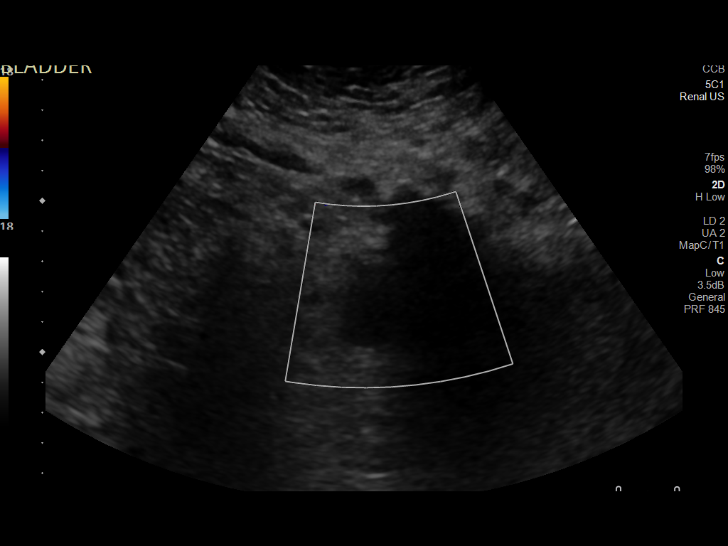

[14 of 25 positions shown; findings below may reference images not displayed]

FINDINGS: Right Kidney:

Renal measurements: 10.7 cm x 4.9 cm x 5.9 cm = volume: 159.2 mL.
Echogenicity within normal limits. No mass or hydronephrosis
visualized.

Left Kidney:

Renal measurements: 11.6 cm x 5.7 cm x 5.7 cm = volume: 198.4 mL.
Mild, diffusely increased echogenicity of the renal parenchyma is
noted. No mass or hydronephrosis visualized.

Bladder:

Poorly distended and subsequently limited in evaluation.

Other:

The study is limited secondary to the patient's body habitus and
difficulty with repositioning/breath hold during the exam.
IMPRESSION: Limited study, as described above, with mildly increased
echogenicity of the left kidney. This may be, in part, technical in
origin. Sequelae associated with medical renal disease cannot be
excluded.

## 2021-07-11 MED ORDER — SODIUM CHLORIDE 0.9 % IV SOLN
8.0000 mg/kg | Freq: Every day | INTRAVENOUS | Status: DC
  Administered 2021-07-12 – 2021-07-21 (×10): 750 mg via INTRAVENOUS
  Filled 2021-07-11 (×14): qty 15

## 2021-07-11 MED ORDER — SODIUM CHLORIDE 0.9 % IV SOLN: INTRAVENOUS | Status: AC

## 2021-07-11 MED ORDER — VANCOMYCIN HCL IN DEXTROSE 1-5 GM/200ML-% IV SOLN
1000.0000 mg | Freq: Two times a day (BID) | INTRAVENOUS | Status: DC
Start: 2021-07-11 — End: 2021-07-11
  Filled 2021-07-11: qty 200

## 2021-07-11 MED ORDER — ONDANSETRON HCL 4 MG/2ML IJ SOLN
4.0000 mg | Freq: Three times a day (TID) | INTRAMUSCULAR | Status: DC | PRN
  Administered 2021-07-11 – 2021-07-17 (×4): 4 mg via INTRAVENOUS
  Filled 2021-07-11 (×4): qty 2

## 2021-07-11 MED ORDER — HYDROMORPHONE HCL 1 MG/ML IJ SOLN
0.5000 mg | INTRAMUSCULAR | Status: DC | PRN
  Administered 2021-07-12 – 2021-07-13 (×2): 0.5 mg via INTRAVENOUS
  Filled 2021-07-11 (×2): qty 0.5

## 2021-07-11 MED ORDER — SODIUM CHLORIDE 0.9 % IV SOLN
2.0000 g | INTRAVENOUS | Status: DC
  Administered 2021-07-12 – 2021-07-16 (×5): 2 g via INTRAVENOUS
  Filled 2021-07-11 (×6): qty 20

## 2021-07-11 MED ORDER — POTASSIUM CHLORIDE 20 MEQ PO PACK
40.0000 meq | PACK | Freq: Once | ORAL | Status: DC
Start: 2021-07-11 — End: 2021-07-12

## 2021-07-11 NOTE — Progress Notes (Signed)
Patient refusing po meds.  He states "I'm just not feeling it."  Patient states he is not good at taking meds by mouth and has always been like this.  Nurse explained to patient that the po potassium ordered was a powder that would be put in water.  He continues to refuse stating "I'm not taking anything unless it's IV."  Patient request that physical therapy be notifed that he will not be doing therapy because the therapy can be to "aggressive" at times, and he's just not feeling up to it right now.  ?

## 2021-07-11 NOTE — Progress Notes (Addendum)
?                                  PROGRESS NOTE                                             ?                                                                                                                     ?                                         ? ? Patient Demographics:  ? ? Michael [name], is a 61 y.o. male, DOB - [birth year], QMV:[id number] ? ?Outpatient Primary MD for the patient is [name], [name]    LOS - 2  Admit date - 07/09/2021   ? ?CC - back pain.    ? ?Brief Narrative (HPI from H&P)  61 y/o man with recent h/o discitis/osteomyelitis T8-9 with initial admission 3/17-3/22/23 when he was discharged on medical management with oral abx. Second admission 4/12-07/02/21 for progressive discititis and osteomyelitis. He underwent aspiration of fluid with bone bx T8-9 06/13/21. He was seen by neurosurgery and ID. He was discharged on medical management completing Rocephin 06/28/30 and switched to cefadroxil, since then he continued to have back discomfort, multiple ER visits, last ER visit at [facility] on 07/09/2021 CT at [facility] suggestive of progressive osteomyelitis T8-T9 with bone erosion.  He also had questionable paravertebral phlegmon with possible abscess, he has been placed on IV antibiotics.  Neurosurgeon Dr. Conchita Paris was consulted over the phone who requested to be transferred to [facility]. ? ? Subjective:  ? ?Patient in bed, appears comfortable, denies any headache, no fever, no chest pain or pressure, no shortness of breath , no abdominal pain. No focal weakness.  Continues to have low back pain ? ? Assessment  & Plan :  ? ? ?Osteomyelitis of thoracic region -  Persistent and worsening osteomyelitis T8-9 now with phlegmon and question of abscesses, septic facet joints after two hospitalizations and multiple rounds of IV and oral abx. Increasing pain.  Currently on vancomycin and Zosyn, inflammatory markers stable, continue pain control, PT OT, MRI of the  spine noted, discussed with Dr. Lisbeth Renshaw neurosurgeon on 07/10/2021 and 07/11/2021.  He will see the patient shortly.  Of note patient is not compliant with PT OT refusing, also refusing multiple medications.  Strictly counseled. ? ? ?Chronic nausea and vomiting - Intermittent recurring problem. GI saw patient during April admission. Patient declined EGD, IV PPI and supportive care.  He is also very noncompliant with medications.  Counseled ? ?Poor dentition - Long standing problem. No  visible infections.  ? ?Patient with oral candidiasis - Nystatin swish and spit ? ?Essential hypertension - blood pressure soft continue on as needed hydralazine only for now. ? ?Obesity .  BMI of 38.  Follow with PCP for weight loss. ?  ?Hypokalemia.  Replaced. ? ?AKI - IVF, Renal US , switch Vanco to Dapto. Ur lytes. ? ? ?   ? ?Condition - Extremely Guarded ? ?Family Communication  : Old wife Dondra SpryGail [phone] on 07/11/2021 at 11:52 AM and message left ? ?Code Status :  Full ? ?Consults  :  NS ? ?PUD Prophylaxis : PPI ? ? Procedures  :    ? ?MRI T Spine -  ? ?   ? ?Disposition Plan  :   ? ?Status is: Inpatient ? ?DVT Prophylaxis  :   ? ?heparin injection 5,000 Units Start: 07/09/21 2200 ?  ? ?Lab Results  ?Component Value Date  ? PLT 355 07/11/2021  ? ? ?Diet :  ?Diet Order   ? ?       ?  Diet Heart Room service appropriate? Yes; Fluid consistency: Thin  Diet effective now       ?  ? ?  ?  ? ?  ?  ? ?Inpatient Medications ? ?Scheduled Meds: ? docusate sodium  100 mg Oral BID  ? gabapentin  300 mg Oral TID  ? heparin  5,000 Units Subcutaneous Q8H  ? nystatin  5 mL Mouth/Throat QID  ? pantoprazole (PROTONIX) IV  40 mg Intravenous Q12H  ? polyethylene glycol  17 g Oral Daily  ? potassium chloride  40 mEq Oral Once  ? ?Continuous Infusions: ? sodium chloride 75 mL/hr at 07/11/21 [id number]  ? piperacillin-tazobactam (ZOSYN)  IV 3.375 g (07/11/21 0552)  ? vancomycin    ? ?PRN Meds:.acetaminophen **OR** acetaminophen, HYDROmorphone  (DILAUDID) injection, LORazepam, ondansetron (ZOFRAN) IV, traZODone ? ?Antibiotics  :   ? ?Anti-infectives (From admission, onward)  ? ? Start     Dose/Rate Route Frequency Ordered Stop  ? 07/11/21 1700  vancomycin (VANCOCIN) IVPB 1000 mg/200 mL premix       ? 1,000 mg ?200 mL/hr over 60 Minutes Intravenous Every 12 hours 07/11/21 0923    ? 07/10/21 1400  vancomycin (VANCOREADY) IVPB 1250 mg/250 mL  Status:  Discontinued       ? 1,250 mg ?166.7 mL/hr over 90 Minutes Intravenous Every 12 hours 07/09/21 2249 07/11/21 0923  ? 07/09/21 2230  piperacillin-tazobactam (ZOSYN) IVPB 3.375 g       ? 3.375 g ?12.5 mL/hr over 240 Minutes Intravenous Every 8 hours 07/09/21 2137    ? ?  ? ? ? Time Spent in minutes  30 ? ? ?Susa RaringPrashant Jennea Rager M.D on 07/11/2021 at 11:52 AM ? ?To page go to [email or web address]  ? ?[facility] -  Office  [phone] ? ?See all Orders from today for further details ? ? ? Objective:  ? ?Vitals:  ? 07/10/21 2000 07/11/21 0000 07/11/21 0307 07/11/21 0744  ?BP: 105/65 107/75 (!) 122/91 101/85  ?Pulse: 94 95 96 100  ?Resp: 14 17 16 12   ?Temp: 97.9 ?F (36.6 ?C) 98 ?F (36.7 ?C) 98.4 ?F (36.9 ?C) 97.9 ?F (36.6 ?C)  ?TempSrc: Oral Oral Oral Oral  ?SpO2: 95% 98% 97%   ?Weight:      ?Height:      ? ? ?Wt Readings from Last 3 Encounters:  ?07/09/21 125.9 kg  ?07/02/21 125.9 kg  ?05/29/21 (!) 149.2 kg  ? ? ? ?Intake/Output  Summary (Last 24 hours) at 07/11/2021 1152 ?Last data filed at 07/10/2021 1300 ?Gross per 24 hour  ?Intake --  ?Output 1400 ml  ?Net -1400 ml  ? ? ? ?Physical Exam ? ?Awake Alert, No new F.N deficits, Normal affect ?[place].AT,PERRAL ?Supple Neck, No JVD,   ?Symmetrical Chest wall movement, Good air movement bilaterally, CTAB ?RRR,No Gallops,Rubs or new Murmurs,  ?+ve B.Sounds, Abd Soft, No tenderness,   ?No Cyanosis, +ve T spine pain, SLR +ve L>R ?  ? ?RN pressure injury documentation: ?Pressure Injury 06/22/21 Coccyx Mid Stage 2 -  Partial thickness loss of dermis presenting as a shallow open injury  with a red, pink wound bed without slough. broken skin blanching (Active)  ?06/22/21 1730  ?Location: Coccyx  ?Location Orientation: Mid  ?Staging: Stage 2 -  Partial thickness loss of dermis presenting as a shallow open injury with a red, pink wound bed without slough.  ?Wound Description (Comments): broken skin blanching  ?Present on Admission: No  ?Dressing Type Foam - Lift dressing to assess site every shift 07/02/21 0900  ? ? ? Data Review:  ? ? ?CBC ?Recent Labs  ?Lab 07/09/21 ?2143 07/10/21 ?0103 07/11/21 ?2130  ?WBC 10.8* 11.9* 11.2*  ?HGB 12.7* 11.9* 12.8*  ?HCT 38.8* 37.3* 38.5*  ?PLT 364 369 355  ?MCV 90.9 92.3 90.6  ?MCH 29.7 29.5 30.1  ?MCHC 32.7 31.9 33.2  ?RDW 16.5* 16.6* 16.6*  ?LYMPHSABS  --   --  2.5  ?MONOABS  --   --  1.0  ?EOSABS  --   --  0.2  ?BASOSABS  --   --  0.1  ? ? ?Electrolytes ?Recent Labs  ?Lab 07/09/21 ?2143 07/10/21 ?0103 07/10/21 ?8657 07/11/21 ?8469  ?NA  --  136  --  137  ?K  --  3.2*  --  3.4*  ?CL  --  104  --  102  ?CO2  --  22  --  25  ?GLUCOSE  --  110*  --  116*  ?BUN  --  9  --  8  ?CREATININE 0.97 1.00  --  1.51*  ?CALCIUM  --  9.0  --  9.1  ?AST  --   --   --  22  ?ALT  --   --   --  40  ?ALKPHOS  --   --   --  96  ?BILITOT  --   --   --  1.0  ?ALBUMIN  --   --   --  2.9*  ?MG  --   --   --  2.2  ?CRP  --   --  1.7* 1.7*  ?PROCALCITON  --   --  <0.10 <0.10  ?HGBA1C  --  6.1*  --   --   ?BNP  --   --   --  15.0  ? ? ?------------------------------------------------------------------------------------------------------------------ ?No results for input(s): CHOL, HDL, LDLCALC, TRIG, CHOLHDL, LDLDIRECT in the last 72 hours. ? ?Lab Results  ?Component Value Date  ? HGBA1C 6.1 (H) 07/10/2021  ? ? ?No results for input(s): TSH, T4TOTAL, T3FREE, THYROIDAB in the last 72 hours. ? ?Invalid input(s): FREET3 ?------------------------------------------------------------------------------------------------------------------ ?ID Labs ?Recent Labs  ?Lab 07/09/21 ?2143 07/10/21 ?0103  07/10/21 ?6295 07/11/21 ?2841  ?WBC 10.8* 11.9*  --  11.2*  ?PLT 364 369  --  355  ?CRP  --   --  1.7* 1.7*  ?PROCALCITON  --   --  <0.10 <0.10  ?CREATININE 0.97 1.00  --  1.51*  ? ?Cardiac Enzymes ?No results fo

## 2021-07-11 NOTE — Consult Note (Signed)
? ? ?[facility]  ?                                                                                     ? ?Patient Identification: ?Patient Name: Michael [name] MRN: [id number] Admit Date: 07/09/2021  7:16 PM ?Today's Date: 07/11/2021 ?Reason for consult: worsening discitis and osteomyelitis  ?Requesting provider: Susa RaringPrashant Singh  ? ?Principal Problem: ?  Osteomyelitis of thoracic region [facility](HCC) ?Active Problems: ?  Essential hypertension ?  Thoracic discitis ?  Poor dentition ?  Nausea and vomiting ? ? ?Antibiotics:  ?Vancomycin 5/11- ?Pip/tazo 5/11- ? ?Lines/Hardware: ? ?Assessment ?# T8-T9 discitis and osteomyelitis with progressive bony erosion and progressive spinal stenosis, cord compression  ?# Paraspinal soft tissue thickening and multiple small para spinous soft tissue abscesses ?Neurosurgery has been consulted, patient refused surgical intervention ?Do not think this is failure of antibiotics and more of a source control issue.  ? ?# Chronic nausea and vomiting - ? compliance of PO cefadroxil  ? ?# AKI - Omn  IVF, Vancomycin switched to daptomycin ?# Morbid Obesity  ? ?Recommendations  ?Continue Daptomycin 8mg /kg and ceftriaxone as is ? ?If no plans for neurosurgical intervention, would have at least IR eval for aspiration/drainage and biopsy of the thoracic space to aerobic/anaerobic/fungal and AFB cultures  ? ?Fu blood cultures  ? ?Dr Earlene PlaterWallace covering this weekend and available as needed.  ? ?Rest of the management as per the primary team. Please call with questions or concerns.  ?Thank you for the consult ? ?Odette FractionSabina Brysin Towery, MD ?Infectious Disease Physician ?[facility]  [facility] ?[address] ?[place], [place] [address] ?Phone: [phone]  Fax: [phone] ? ?__________________________________________________________________________________________________________ ?HPI  and Hospital Course: ?61 Y O male with h/o Obesity, HTN, T8-T9 discitis who was transferred from OSH for worsening back pain with nausea and vomiting. Patient initially admitted 3/17-3/22 for T8-T9 Discitis with associated E coli bacteremia with IR aspirate 3/19 growing E coli, s/p cefazolin to be completed on 4/28 for 6 weeks. Patient readmitted 4/13-5/3 with worsening back pain. MRI T spine with worsening findings, no intervention per Neurosurgery , s/p IR disc aspiration 4/14 with no growth in cultures. Patient completed 8 weeks of treatment with cefazolin followed by ceftriaxone on 4/28 and then switched to po cefadroxil.  ?He was discharged on 5/3. However, [facility] with worsening back pain, nausea and vomiting. He tells me he has not been able to take his medications due to nausea and vomiting.  ? ?CT T spine 07/08/21 ?IMPRESSION:  ?1. Progression of T8-T9 discitis osteomyelitis since MRI and CT from  ?April 2013. Increased endplate erosion and bony destructive change  ?of the anterior vertebral bodies with progressive loss of height of  ?T8 and T9. Progression of paravertebral phlegmonous changes. Small  ?hypoattenuating foci within the phlegmonous change potentially  ?representing small paraspinal abscess. Approximate 7 mm retropulsion  ?of osseous fragment into the spinal canal with mass effect on thecal  ?sac and at least moderate canal stenosis at the level of T8-T9.  ?Increased bilateral facet irregularity at T8-T9, suspect for septic  ?facet joints.  MRI with and without contrast is recommended to better  ?evaluate the described findings.   ? ?At ED afebrile, WBC 11.9 ?MRI T spine reviewed  ?No plans for surgical intervention per Neurosurgery  ? ?ROS: Denies fevers, chills ?Has nausea and vomiting ?Denies urinary and stool incontinence, new weakness and numbness ?Back pain + ? ?Past Medical History:  ?Diagnosis Date  ? Class 1 obesity due to excess calories with body mass index (BMI)  of 34.0 to 34.9 in adult   ? Diskitis   ? Hypertension   ? ?Past Surgical History:  ?Procedure Laterality Date  ? IR CERVICAL/THORACIC DISC ASPIRATION W/IMAG GUIDE  05/18/2021  ? IR FLUORO GUIDED NEEDLE PLC ASPIRATION/INJECTION LOC  06/13/2021  ? ? ?Scheduled Meds: ? docusate sodium  100 mg Oral BID  ? gabapentin  300 mg Oral TID  ? heparin  5,000 Units Subcutaneous Q8H  ? nystatin  5 mL Mouth/Throat QID  ? pantoprazole (PROTONIX) IV  40 mg Intravenous Q12H  ? polyethylene glycol  17 g Oral Daily  ? potassium chloride  40 mEq Oral Once  ? ?Continuous Infusions: ? sodium chloride 75 mL/hr at 07/11/21 4081  ? cefTRIAXone (ROCEPHIN)  IV    ? DAPTOmycin (CUBICIN)  IV    ? ?PRN Meds:.acetaminophen **OR** acetaminophen, HYDROmorphone (DILAUDID) injection, LORazepam, ondansetron (ZOFRAN) IV, traZODone ? ?No Known Allergies ? ?Social History  ? ?Socioeconomic History  ? Marital status: Married  ?  Spouse name: Not on file  ? Number of children: Not on file  ? Years of education: Not on file  ? Highest education level: Not on file  ?Occupational History  ? Occupation: truck Hospital doctor  ?Tobacco Use  ? Smoking status: Never  ? Smokeless tobacco: Never  ?Vaping Use  ? Vaping Use: Never used  ?Substance and Sexual Activity  ? Alcohol use: Not Currently  ? Drug use: Never  ? Sexual activity: Not Currently  ?Other Topics Concern  ? Not on file  ?Social History Narrative  ? Not on file  ? ?Social Determinants of Health  ? ?Financial Resource Strain: Not on file  ?Food Insecurity: Not on file  ?Transportation Needs: Not on file  ?Physical Activity: Not on file  ?Stress: Not on file  ?Social Connections: Not on file  ?Intimate Partner Violence: Not on file  ? ?History reviewed. No pertinent family history. ? ? ?Vitals ?BP (!) 134/93 (BP Location: Left Wrist)   Pulse 94   Temp 97.6 ?F (36.4 ?C) (Oral)   Resp 20   Ht 5\' 11"  (1.803 m)   Wt 125.9 kg   SpO2 95%   BMI 38.71 kg/m?  ? ?Physical Exam ?Constitutional:  Morbidly obese male  lying in bed  ?   Comments:  ? ?Cardiovascular:  ?   Rate and Rhythm: Normal rate and regular rhythm.  ?   Heart sounds:  ? ?Pulmonary:  ?   Effort: Pulmonary effort is normal on room air ?   Comments: Normal breath sounds  ? ?Abdominal:  ?   Palpations: Abdomen is soft.  ?   Tenderness: non distended and non tender  ? ?Musculoskeletal:     ?   General: No swelling or tenderness.  ? ?Skin: ?   Comments:  ? ?Neurological:  ?   General: Grossly non focal, awake, alert and oriented. Power 5/5 in all extremities ? ?Psychiatric:     ?   Mood and Affect: Mood normal.  ? ? ?Pertinent Microbiology ?Results for orders placed  or performed during the hospital encounter of 07/09/21  ?MRSA Next Gen by PCR, Nasal     Status: None  ? Collection Time: 07/10/21  6:45 AM  ? Specimen: Nasal Mucosa; Nasal Swab  ?Result Value Ref Range Status  ? MRSA by PCR Next Gen NOT DETECTED NOT DETECTED Final  ?  Comment: (NOTE) ?The GeneXpert MRSA Assay (FDA approved for NASAL specimens only), ?is one component of a comprehensive MRSA colonization surveillance ?program. It is not intended to diagnose MRSA infection nor to guide ?or monitor treatment for MRSA infections. ?Test performance is not FDA approved in patients less than 2 years ?old. ?Performed at [facility], [address], [place], [place] ?[address] ?  ?Culture, blood (Routine X 2) w Reflex to ID Panel     Status: None (Preliminary result)  ? Collection Time: 07/10/21  2:59 PM  ? Specimen: BLOOD  ?Result Value Ref Range Status  ? Specimen Description BLOOD LEFT ANTECUBITAL  Final  ? Special Requests   Final  ?  BOTTLES DRAWN AEROBIC AND ANAEROBIC Blood Culture adequate volume  ? Culture   Final  ?  NO GROWTH < 24 HOURS ?Performed at [facility], [address], [place], [place] [address] ?  ? Report Status PENDING  Incomplete  ?Culture, blood (Routine X 2) w Reflex to ID Panel     Status: None (Preliminary result)  ? Collection Time: 07/10/21  3:00 PM  ? Specimen: BLOOD   ?Result Value Ref Range Status  ? Specimen Description BLOOD RIGHT ANTECUBITAL  Final  ? Special Requests   Final  ?  BOTTLES DRAWN AEROBIC AND ANAEROBIC Blood Culture adequate volume  ? Culture   Final  ?  NO G

## 2021-07-11 NOTE — Progress Notes (Signed)
Orthopedic Tech Progress Note ?Patient Details:  ?Michael [name] ?04/25/1960 ?[id number] ? ?Ortho Devices ?Type of Ortho Device: Thoracolumbar corset (TLSO) ?Ortho Device/Splint Interventions: Ordered ?  ?Post Interventions ?Instructions Provided: Care of device, Adjustment of device ?Brace dropped off in room with patient and provider. To be worn OOB only. ?[place] ?07/11/2021, 4:34 PM ? ?

## 2021-07-11 NOTE — Progress Notes (Signed)
Pt refused UA collection and antibiotics Rocephin and Daptomycin ordered by Singh,MD. Pt expressed concerns on UA collection and requested to speak with MD. MD notified and aware of situation. ?

## 2021-07-11 NOTE — Consult Note (Signed)
? ?  Providing Compassionate, Quality Care - Together ? ?Neurosurgery Consult  ?Referring physician: Dr. Thedore Mins ?Reason for referral: Progressive osteodiscitis ? ?Chief Complaint: Progressive mid back pain ? ?History of Present Illness: This is a 61 year old male, with a history of T8-9 osteodiscitis, positive E. coli, that has undergone oral antibiotics as well as IV antibiotics and recently was transferred back to the hospital due to continued back pain.  He was transferred from [facility] to [facility] for neurosurgical evaluation given his progressive mid back pain and failure of conservative measures.  At this time he complains of moderate back pain, primarily worsening with activity.  He is primarily bedbound due to severe back pain.  He denies any weakness, numbness or tingling in his legs or his trunk.  He denies any urinary or fecal incontinence. ? ?Medications: I have reviewed the patient's current medications. ?Allergies: No Known Allergies ? ?History reviewed. No pertinent family history. ?Social History:  has no history on file for tobacco use, alcohol use, and drug use. ? ?ROS: ?All pertinent positives and negatives are listed in HPI above ? ?Physical Exam:  ?Vital signs in last 24 hours: ?Temp:  [98 ?F (36.7 ?C)-98.3 ?F (36.8 ?C)] 98 ?F (36.7 ?C) (07/25 1814) ?Pulse Rate:  [58-128] 65 (07/26 0746) ?Resp:  [11-18] 14 (07/26 0217) ?BP: (138-182)/(65-125) 153/88 (07/26 0700) ?SpO2:  [91 %-98 %] 96 % (07/26 0746) ?PE: ?Awake alert oriented x3, no acute distress ?Speech fluent and appropriate ?PERRLA ?Morbidly obese ?Bilateral upper extremities 4+/5 throughout ?Bilateral lower extremities 4+/5 throughout ?Sensory intact light touch throughout ?No clonus in the bilateral lower extremities ? ? ?Impression/Assessment:  ?61 year old male with ? ?T8-9 progressive osteodiscitis with kyphosis and stenosis ? ?Plan:  ?-Imaging reviewed, MRI shows progressive kyphosis with fluid collection within the disc space  consistent with infection.  There is no frank drainable abscess, there is moderate stenosis now with mild T2 signal change in the spinal cord.  This appears progressed compared to the prior MRI.  CT scans also show progressive bony erosion and kyphosis. ?-I had extensive discussion with the patient and then with his wife over the phone [name]), I discussed with them options including bracing for stabilization to continue to have opportunity for this to heal or surgical intervention in the form of a transthoracic two-level corpectomy for stabilization and posterior decompression instrumentation and fusion.  We discussed all risks, benefits and expected outcomes of both as well as alternatives of each treatment.  I answered all of their questions.  At this time the patient does not have signs of lower extremity weakness or clonus therefore I do not believe this is emergent.  However his MRI has shown evidence of progression therefore I discussed with him the opportunity of surgical intervention at this time however he is adamant about not undergoing surgical intervention at this time.  Therefore I discussed with the wife about ordering a TLSO brace and giving this an opportunity to see how he tolerates the bracing. ?-I ordered a TLSO brace, we will give this an opportunity for stabilization conservatively ?-TLSO brace when out of bed and ambulating, does not need it while in bed ?-I will continue to follow along during this admission ? ?Thank you for allowing me to participate in this patient's care.  Please do not hesitate to call with questions or concerns. ? ? ?Carmel Waddington, DO ?Neurosurgeon ?[facility] ?Cell: [phone] ? ? ? ? ?

## 2021-07-12 DIAGNOSIS — M4624 Osteomyelitis of vertebra, thoracic region: Secondary | ICD-10-CM | POA: Diagnosis not present

## 2021-07-12 LAB — CBC WITH DIFFERENTIAL/PLATELET
Abs Immature Granulocytes: 0.37 10*3/uL — ABNORMAL HIGH (ref 0.00–0.07)
Basophils Absolute: 0.1 10*3/uL (ref 0.0–0.1)
Basophils Relative: 1 %
Eosinophils Absolute: 0.2 10*3/uL (ref 0.0–0.5)
Eosinophils Relative: 2 %
HCT: 38.6 % — ABNORMAL LOW (ref 39.0–52.0)
Hemoglobin: 12.7 g/dL — ABNORMAL LOW (ref 13.0–17.0)
Immature Granulocytes: 3 %
Lymphocytes Relative: 19 %
Lymphs Abs: 2.1 10*3/uL (ref 0.7–4.0)
MCH: 29.8 pg (ref 26.0–34.0)
MCHC: 32.9 g/dL (ref 30.0–36.0)
MCV: 90.6 fL (ref 80.0–100.0)
Monocytes Absolute: 0.9 10*3/uL (ref 0.1–1.0)
Monocytes Relative: 8 %
Neutro Abs: 7.7 10*3/uL (ref 1.7–7.7)
Neutrophils Relative %: 67 %
Platelets: 351 10*3/uL (ref 150–400)
RBC: 4.26 MIL/uL (ref 4.22–5.81)
RDW: 16.6 % — ABNORMAL HIGH (ref 11.5–15.5)
WBC: 11.4 10*3/uL — ABNORMAL HIGH (ref 4.0–10.5)
nRBC: 0 % (ref 0.0–0.2)

## 2021-07-12 LAB — C-REACTIVE PROTEIN: CRP: 1.6 mg/dL — ABNORMAL HIGH (ref <1.0)

## 2021-07-12 LAB — COMPREHENSIVE METABOLIC PANEL
ALT: 30 U/L (ref 0–44)
AST: 19 U/L (ref 15–41)
Albumin: 2.9 g/dL — ABNORMAL LOW (ref 3.5–5.0)
Alkaline Phosphatase: 87 U/L (ref 38–126)
Anion gap: 13 (ref 5–15)
BUN: 10 mg/dL (ref 6–20)
CO2: 19 mmol/L — ABNORMAL LOW (ref 22–32)
Calcium: 9 mg/dL (ref 8.9–10.3)
Chloride: 106 mmol/L (ref 98–111)
Creatinine, Ser: 1.2 mg/dL (ref 0.61–1.24)
GFR, Estimated: >60 mL/min (ref >60)
Glucose, Bld: 91 mg/dL (ref 70–99)
Potassium: 3.1 mmol/L — ABNORMAL LOW (ref 3.5–5.1)
Sodium: 138 mmol/L (ref 135–145)
Total Bilirubin: 0.7 mg/dL (ref 0.3–1.2)
Total Protein: 6.5 g/dL (ref 6.5–8.1)

## 2021-07-12 LAB — PROCALCITONIN: Procalcitonin: <0.1 ng/mL

## 2021-07-12 LAB — BRAIN NATRIURETIC PEPTIDE: B Natriuretic Peptide: 17.3 pg/mL (ref 0.0–100.0)

## 2021-07-12 LAB — MAGNESIUM: Magnesium: 2.1 mg/dL (ref 1.7–2.4)

## 2021-07-12 MED ORDER — PANTOPRAZOLE SODIUM 40 MG PO TBEC
40.0000 mg | DELAYED_RELEASE_TABLET | Freq: Every day | ORAL | Status: DC
  Filled 2021-07-12 (×3): qty 1

## 2021-07-12 MED ORDER — ENSURE ENLIVE PO LIQD
237.0000 mL | Freq: Two times a day (BID) | ORAL | Status: DC
  Administered 2021-07-12 – 2021-07-24 (×11): 237 mL via ORAL

## 2021-07-12 MED ORDER — BISACODYL 10 MG RE SUPP: 10.0000 mg | RECTAL | Status: DC | PRN

## 2021-07-12 MED ORDER — POTASSIUM CHLORIDE 10 MEQ/100ML IV SOLN
10.0000 meq | INTRAVENOUS | Status: AC
  Administered 2021-07-12 (×4): 10 meq via INTRAVENOUS
  Filled 2021-07-12 (×4): qty 100

## 2021-07-12 NOTE — Progress Notes (Signed)
IR received a request to evaluate this patient for a possible T8-T9 intervention for discitis/osteomyelitis. IR performed procedures 05/18/21 and 06/13/21 for the same issue. Interventions done at this spinal level carry a high risk and a third attempt at an intervention is unlikely to yield any meaningful benefit. IR recommends continued antibiotic management or surgical intervention.  ? ?No IR procedure planned and the order will be deleted. Dr. Candiss Norse and Dr. West Bali  notified via [facility]. ? Soyla Dryer, AGACNP-BC ?[phone] ?07/12/2021, 9:27 AM ? ? ?

## 2021-07-12 NOTE — Plan of Care (Signed)

## 2021-07-12 NOTE — Progress Notes (Signed)
NEUROSURGERY PROGRESS NOTE ? ?Doing well. No acute changes over night. His neurologic function is the same.  ? ?Temp:  [97.2 ?F (36.2 ?C)-97.9 ?F (36.6 ?C)] 97.2 ?F (36.2 ?C) (05/13 0700) ?Pulse Rate:  [62-96] 62 (05/13 0313) ?Resp:  [15-20] 15 (05/13 0700) ?BP: (125-143)/(89-99) 135/89 (05/13 0700) ?SpO2:  [95 %] 95 % (05/12 1615) ? ? ? ?Eleonore Chiquito, NP ?07/12/2021 ?12:28 PM ?  ?

## 2021-07-12 NOTE — Progress Notes (Signed)
?                                  PROGRESS NOTE                                             ?                                                                                                                     ?                                         ? ? Patient Demographics:  ? ? Michael [name], is a 61 y.o. male, DOB - 27-Jun-1960, ZPH:[id number] ? ?Outpatient Primary MD for the patient is [name], [name]    LOS - 3  Admit date - 07/09/2021   ? ?CC - back pain.    ? ?Brief Narrative (HPI from H&P)  61 y/o man with recent h/o discitis/osteomyelitis T8-9 with initial admission 3/17-3/22/23 when he was discharged on medical management with oral abx. Second admission 4/12-07/02/21 for progressive discititis and osteomyelitis. He underwent aspiration of fluid with bone bx T8-9 06/13/21. He was seen by neurosurgery and ID. He was discharged on medical management completing Rocephin 06/28/30 and switched to cefadroxil, since then he continued to have back discomfort, multiple ER visits, last ER visit at [facility] on 07/09/2021 CT at [facility] suggestive of progressive osteomyelitis T8-T9 with bone erosion.  He also had questionable paravertebral phlegmon with possible abscess, he has been placed on IV antibiotics.  Neurosurgeon Dr. Conchita Paris was consulted over the phone who requested to be transferred to [facility]. ? ? Subjective:  ? ?Patient in bed, appears comfortable, denies any headache, no fever, no chest pain or pressure, no shortness of breath , no abdominal pain. No new focal weakness, +ve low back pain. ? ? Assessment  & Plan :  ? ? ?Osteomyelitis of thoracic region -  Persistent and worsening osteomyelitis T8-9 now with phlegmon and question of abscesses, septic facet joints after two hospitalizations and multiple rounds of IV and oral abx.  MRI of the spine noted with worsening fluid collection suspicious for worsening discitis and abscess, seen by neurosurgery patient  refused surgical intervention, TLSO brace ordered which he again is refusing to wear, strictly counseled, IR consulted but per IR CT-guided aspirations are not can help, patient clearly told about it.  ID on board titrating antibiotics.  Overall prognosis is very poor as patient is noncompliant with taking medications and medical recommendations.  Has refused surgery refusing to wear TLSO brace. ? ? ?Chronic nausea and vomiting - Intermittent recurring problem. GI saw patient during April admission. Patient declined  EGD, IV PPI and supportive care.  He is also very noncompliant with medications.  Counseled ? ?Poor dentition - Long standing problem. No visible infections.  ? ?Patient with oral candidiasis - Nystatin swish and spit ? ?Essential hypertension - blood pressure soft continue on as needed hydralazine only for now. ? ?Obesity .  BMI of 38.  Follow with PCP for weight loss. ?  ?Hypokalemia and hypomagnesemia.  Replaced. ? ?AKI - IVF, Renal US , switch Vanco to Dapto. Ur lytes.  AKI has resolved. ? ?GERD.  On PPI. ? ?   ? ?Condition - Extremely Guarded ? ?Family Communication  :  ? ?called wife [name] [phone] on 07/11/2021 at 11:52 AM and message left, called 07/12/21 ? ?Code Status :  Full ? ?Consults  :  NS, ID, IR ? ?PUD Prophylaxis : PPI ? ? Procedures  :    ? ?MRI T Spine - Discitis and osteomyelitis at T8-9. Progressive bony erosion since the prior MRI of 06/12/2021 but no change from the CT of 07/08/2021. Progressive spinal stenosis with moderate spinal stenosis, cord compression, and cord hyperintensity which has developed in the interval. Increased fluid in the disc space compatible with infection. Paraspinous soft tissue thickening and multiple small paraspinous soft tissue abscesses similar to the prior study  ? ?   ? ?Disposition Plan  :   ? ?Status is: Inpatient ? ?DVT Prophylaxis  :   ? ?heparin injection 5,000 Units Start: 07/09/21 2200 ?  ? ?Lab Results  ?Component Value Date  ? PLT 351  07/12/2021  ? ? ?Diet :  ?Diet Order   ? ?       ?  Diet Heart Room service appropriate? Yes; Fluid consistency: Thin  Diet effective now       ?  ? ?  ?  ? ?  ?  ? ?Inpatient Medications ? ?Scheduled Meds: ? docusate sodium  100 mg Oral BID  ? feeding supplement  237 mL Oral BID BM  ? gabapentin  300 mg Oral TID  ? heparin  5,000 Units Subcutaneous Q8H  ? nystatin  5 mL Mouth/Throat QID  ? pantoprazole  40 mg Oral Daily  ? polyethylene glycol  17 g Oral Daily  ? ?Continuous Infusions: ? cefTRIAXone (ROCEPHIN)  IV    ? DAPTOmycin (CUBICIN)  IV    ? potassium chloride 10 mEq (07/12/21 [id number])  ? ?PRN Meds:.acetaminophen **OR** acetaminophen, bisacodyl, HYDROmorphone (DILAUDID) injection, LORazepam, ondansetron (ZOFRAN) IV, traZODone ? ?Time Spent in minutes  30 ? ? ?Lala Lund M.D on 07/12/2021 at 9:43 AM ? ?To page go to [email or web address]  ? ?[facility] -  Office  [phone] ? ?See all Orders from today for further details ? ? ? Objective:  ? ?Vitals:  ? 07/11/21 1952 07/11/21 2314 07/12/21 0313 07/12/21 0700  ?BP: 139/90 125/89 (!) 143/99 135/89  ?Pulse: 91 96 62   ?Resp: 20 20 20 15   ?Temp: 97.9 ?F (36.6 ?C) 97.9 ?F (36.6 ?C) 97.8 ?F (36.6 ?C) (!) 97.2 ?F (36.2 ?C)  ?TempSrc: Oral Oral Oral Axillary  ?SpO2:      ?Weight:      ?Height:      ? ? ?Wt Readings from Last 3 Encounters:  ?07/09/21 125.9 kg  ?07/02/21 125.9 kg  ?05/29/21 (!) 149.2 kg  ? ? ? ?Intake/Output Summary (Last 24 hours) at 07/12/2021 0943 ?Last data filed at 07/12/2021 [id number] ?Gross per 24 hour  ?Intake 851.64 ml  ?Output 625 ml  ?Net  226.64 ml  ? ? ? ?Physical Exam ? ?Awake Alert, No new F.N deficits, flat affect,  +ve T spine pain, SLR +ve L>R ?[place].AT,PERRAL ?Supple Neck, No JVD,   ?Symmetrical Chest wall movement, Good air movement bilaterally, CTAB ?RRR,No Gallops, Rubs or new Murmurs,  ?+ve B.Sounds, Abd Soft, No tenderness,   ?No Cyanosis, ?  ? ?RN pressure injury documentation: ?Pressure Injury 06/22/21 Coccyx Mid Stage 2 -  Partial  thickness loss of dermis presenting as a shallow open injury with a red, pink wound bed without slough. broken skin blanching (Active)  ?06/22/21 1730  ?Location: Coccyx  ?Location Orientation: Mid  ?Staging: Stage 2 -  Partial thickness loss of dermis presenting as a shallow open injury with a red, pink wound bed without slough.  ?Wound Description (Comments): broken skin blanching  ?Present on Admission: No  ?Dressing Type Foam - Lift dressing to assess site every shift 07/02/21 0900  ? ? ? Data Review:  ? ? ?CBC ?Recent Labs  ?Lab 07/09/21 ?2143 07/10/21 ?0103 07/11/21 ?[id number] 07/12/21 ?0146  ?WBC 10.8* 11.9* 11.2* 11.4*  ?HGB 12.7* 11.9* 12.8* 12.7*  ?HCT 38.8* 37.3* 38.5* 38.6*  ?PLT 364 369 355 351  ?MCV 90.9 92.3 90.6 90.6  ?MCH 29.7 29.5 30.1 29.8  ?MCHC 32.7 31.9 33.2 32.9  ?RDW 16.5* 16.6* 16.6* 16.6*  ?LYMPHSABS  --   --  2.5 2.1  ?MONOABS  --   --  1.0 0.9  ?EOSABS  --   --  0.2 0.2  ?BASOSABS  --   --  0.1 0.1  ? ? ?Electrolytes ?Recent Labs  ?Lab 07/09/21 ?2143 07/10/21 ?0103 07/10/21 ?[id number] 07/11/21 ?[id number] 07/12/21 ?0146  ?NA  --  136  --  137 138  ?K  --  3.2*  --  3.4* 3.1*  ?CL  --  104  --  102 106  ?CO2  --  22  --  25 19*  ?GLUCOSE  --  110*  --  116* 91  ?BUN  --  9  --  8 10  ?CREATININE 0.97 1.00  --  1.51* 1.20  ?CALCIUM  --  9.0  --  9.1 9.0  ?AST  --   --   --  22 19  ?ALT  --   --   --  40 30  ?ALKPHOS  --   --   --  96 87  ?BILITOT  --   --   --  1.0 0.7  ?ALBUMIN  --   --   --  2.9* 2.9*  ?MG  --   --   --  2.2 2.1  ?CRP  --   --  1.7* 1.7* 1.6*  ?PROCALCITON  --   --  <0.10 <0.10 <0.10  ?HGBA1C  --  6.1*  --   --   --   ?BNP  --   --   --  15.0 17.3  ? ? ?------------------------------------------------------------------------------------------------------------------ ?No results for input(s): CHOL, HDL, LDLCALC, TRIG, CHOLHDL, LDLDIRECT in the last 72 hours. ? ?Lab Results  ?Component Value Date  ? HGBA1C 6.1 (H) 07/10/2021  ? ? ?No results for input(s): TSH, T4TOTAL, T3FREE, THYROIDAB in  the last 72 hours. ? ?Invalid input(s): FREET3 ?------------------------------------------------------------------------------------------------------------------ ?ID Labs ?Recent Labs  ?Lab 07/09/21 ?2143 05/

## 2021-07-13 DIAGNOSIS — M4624 Osteomyelitis of vertebra, thoracic region: Secondary | ICD-10-CM | POA: Diagnosis not present

## 2021-07-13 LAB — CBC WITH DIFFERENTIAL/PLATELET
Abs Immature Granulocytes: 0.47 10*3/uL — ABNORMAL HIGH (ref 0.00–0.07)
Basophils Absolute: 0.1 10*3/uL (ref 0.0–0.1)
Basophils Relative: 1 %
Eosinophils Absolute: 0.2 10*3/uL (ref 0.0–0.5)
Eosinophils Relative: 2 %
HCT: 39.4 % (ref 39.0–52.0)
Hemoglobin: 12.7 g/dL — ABNORMAL LOW (ref 13.0–17.0)
Immature Granulocytes: 4 %
Lymphocytes Relative: 16 %
Lymphs Abs: 1.7 10*3/uL (ref 0.7–4.0)
MCH: 29.5 pg (ref 26.0–34.0)
MCHC: 32.2 g/dL (ref 30.0–36.0)
MCV: 91.4 fL (ref 80.0–100.0)
Monocytes Absolute: 0.9 10*3/uL (ref 0.1–1.0)
Monocytes Relative: 9 %
Neutro Abs: 7.4 10*3/uL (ref 1.7–7.7)
Neutrophils Relative %: 68 %
Platelets: 314 10*3/uL (ref 150–400)
RBC: 4.31 MIL/uL (ref 4.22–5.81)
RDW: 16.4 % — ABNORMAL HIGH (ref 11.5–15.5)
WBC: 10.8 10*3/uL — ABNORMAL HIGH (ref 4.0–10.5)
nRBC: 0 % (ref 0.0–0.2)

## 2021-07-13 LAB — COMPREHENSIVE METABOLIC PANEL
ALT: 35 U/L (ref 0–44)
AST: 25 U/L (ref 15–41)
Albumin: 2.9 g/dL — ABNORMAL LOW (ref 3.5–5.0)
Alkaline Phosphatase: 92 U/L (ref 38–126)
Anion gap: 11 (ref 5–15)
BUN: 10 mg/dL (ref 6–20)
CO2: 21 mmol/L — ABNORMAL LOW (ref 22–32)
Calcium: 9.2 mg/dL (ref 8.9–10.3)
Chloride: 105 mmol/L (ref 98–111)
Creatinine, Ser: 1.08 mg/dL (ref 0.61–1.24)
GFR, Estimated: >60 mL/min (ref >60)
Glucose, Bld: 122 mg/dL — ABNORMAL HIGH (ref 70–99)
Potassium: 3.1 mmol/L — ABNORMAL LOW (ref 3.5–5.1)
Sodium: 137 mmol/L (ref 135–145)
Total Bilirubin: 0.8 mg/dL (ref 0.3–1.2)
Total Protein: 7 g/dL (ref 6.5–8.1)

## 2021-07-13 LAB — BRAIN NATRIURETIC PEPTIDE: B Natriuretic Peptide: 21.2 pg/mL (ref 0.0–100.0)

## 2021-07-13 LAB — MAGNESIUM: Magnesium: 2 mg/dL (ref 1.7–2.4)

## 2021-07-13 LAB — PROCALCITONIN: Procalcitonin: <0.1 ng/mL

## 2021-07-13 LAB — C-REACTIVE PROTEIN: CRP: 1.5 mg/dL — ABNORMAL HIGH (ref <1.0)

## 2021-07-13 MED ORDER — MORPHINE SULFATE (PF) 2 MG/ML IV SOLN
1.0000 mg | INTRAVENOUS | Status: DC | PRN
  Administered 2021-07-13 – 2021-07-20 (×11): 1 mg via INTRAVENOUS
  Filled 2021-07-13 (×11): qty 1

## 2021-07-13 MED ORDER — POTASSIUM CHLORIDE 10 MEQ/100ML IV SOLN
INTRAVENOUS | Status: AC
  Filled 2021-07-13: qty 100

## 2021-07-13 MED ORDER — POTASSIUM CHLORIDE 10 MEQ/100ML IV SOLN
10.0000 meq | INTRAVENOUS | Status: AC
  Administered 2021-07-13 (×5): 10 meq via INTRAVENOUS
  Filled 2021-07-13 (×3): qty 100

## 2021-07-13 NOTE — Progress Notes (Signed)
?                                  PROGRESS NOTE                                             ?                                                                                                                     ?                                         ? ? Patient Demographics:  ? ? Michael [name], is a 61 y.o. male, DOB - 1960/08/25, NOB:[id number] ? ?Outpatient Primary MD for the patient is [name], [name]    LOS - 4  Admit date - 07/09/2021   ? ?CC - back pain.    ? ?Brief Narrative (HPI from H&P)  61 y/o man with recent h/o discitis/osteomyelitis T8-9 with initial admission 3/17-3/22/23 when he was discharged on medical management with oral abx. Second admission 4/12-07/02/21 for progressive discititis and osteomyelitis. He underwent aspiration of fluid with bone bx T8-9 06/13/21. He was seen by neurosurgery and ID. He was discharged on medical management completing Rocephin 06/28/30 and switched to cefadroxil, since then he continued to have back discomfort, multiple ER visits, last ER visit at [facility] on 07/09/2021 CT [facility] suggestive of progressive osteomyelitis T8-T9 with bone erosion.  He also had questionable paravertebral phlegmon with possible abscess, he has been placed on IV antibiotics.  Neurosurgeon Dr. Conchita Paris was consulted over the phone who requested to be transferred to [facility]. ? ? Subjective:  ? ?Patient in bed, appears comfortable, denies any headache, no fever, no chest pain or pressure, no shortness of breath , no abdominal pain. No new focal weakness, +ve low back pain. ? ? Assessment  & Plan :  ? ? ?Osteomyelitis of thoracic region -  Persistent and worsening osteomyelitis T8-9 now with phlegmon and question of abscesses, septic facet joints after two hospitalizations and multiple rounds of IV and oral abx.  MRI of the spine noted with worsening fluid collection suspicious for worsening discitis and abscess. ? ?He was seen by IR,  neurosurgery and ID.  ID is directing the antibiotic treatment, he initially refused neurosurgical intervention but now agrees to it, does not want to use TLSO brace.  Per IR aspirations do not help in this clinical scenario.  Form neurosurgery and they will see him again on 07/14/2021 for possible surgical intervention middle of next week.  Continue supportive care.  Still does not follow medication recommendations and refusing a lot of medicines from time to time.  Refused going on long-acting narcotic and does not want oral Neurontin. ? ?Chronic nausea and vomiting - Intermittent recurring problem. GI saw patient during April admission. Patient declined EGD, IV PPI and supportive care.  He is also very noncompliant with medications.  Counseled ? ?Poor dentition - Long standing problem. No visible infections.  ? ?Patient with oral candidiasis - Nystatin swish and spit ? ?Essential hypertension - blood pressure soft continue on as needed hydralazine only for now. ? ?Obesity .  BMI of 38.  Follow with PCP for weight loss. ?  ?Hypokalemia and hypomagnesemia.  Replaced. ? ?AKI - IVF, Renal US , switch Vanco to Dapto. Ur lytes.  AKI has resolved. ? ?GERD.  On PPI. ? ?   ? ?Condition - Extremely Guarded ? ?Family Communication  :  ? ?called wife [name] [phone] on 07/11/2021 at 11:52 AM and message left, called 07/12/21 ? ?Code Status :  Full ? ?Consults  :  NS, ID, IR ? ?PUD Prophylaxis : PPI ? ? Procedures  :    ? ?MRI T Spine - Discitis and osteomyelitis at T8-9. Progressive bony erosion since the prior MRI of 06/12/2021 but no change from the CT of 07/08/2021. Progressive spinal stenosis with moderate spinal stenosis, cord compression, and cord hyperintensity which has developed in the interval. Increased fluid in the disc space compatible with infection. Paraspinous soft tissue thickening and multiple small paraspinous soft tissue abscesses similar to the prior study  ? ?   ? ?Disposition Plan  :   ? ?Status is:  Inpatient ? ?DVT Prophylaxis  :   ? ?heparin injection 5,000 Units Start: 07/09/21 2200 ?  ? ?Lab Results  ?Component Value Date  ? PLT 314 07/13/2021  ? ? ?Diet :  ?Diet Order   ? ?       ?  Diet Heart Room service appropriate? Yes; Fluid consistency: Thin  Diet effective now       ?  ? ?  ?  ? ?  ?  ? ?Inpatient Medications ? ?Scheduled Meds: ? docusate sodium  100 mg Oral BID  ? feeding supplement  237 mL Oral BID BM  ? heparin  5,000 Units Subcutaneous Q8H  ? nystatin  5 mL Mouth/Throat QID  ? pantoprazole  40 mg Oral Daily  ? polyethylene glycol  17 g Oral Daily  ? ?Continuous Infusions: ? cefTRIAXone (ROCEPHIN)  IV Stopped (07/12/21 1623)  ? DAPTOmycin (CUBICIN)  IV Stopped (07/12/21 2015)  ? potassium chloride 10 mEq (07/13/21 0656)  ? ?PRN Meds:.acetaminophen **OR** acetaminophen, bisacodyl, HYDROmorphone (DILAUDID) injection, LORazepam, ondansetron (ZOFRAN) IV, traZODone ? ?Time Spent in minutes  30 ? ? ?Susa Raring M.D on 07/13/2021 at 9:35 AM ? ?To page go to [email or web address]  ? ?[facility] -  Office  [phone] ? ?See all Orders from today for further details ? ? ? Objective:  ? ?Vitals:  ? 07/12/21 1622 07/13/21 0041 07/13/21 0400 07/13/21 0819  ?BP: (!) 145/90 138/88 132/79 (!) 135/92  ?Pulse: 86 90 88 90  ?Resp: 18 18 18 16   ?Temp: 98 ?F (36.7 ?C) 97.9 ?F (36.6 ?C) 98 ?F (36.7 ?C) 97.6 ?F (36.4 ?C)  ?TempSrc: Oral Axillary Axillary Oral  ?SpO2: 95% 96% 98% 95%  ?Weight:      ?Height:      ? ? ?Wt Readings from Last 3 Encounters:  ?07/09/21 125.9 kg  ?07/02/21 125.9 kg  ?05/29/21 (!) 149.2 kg  ? ? ? ?Intake/Output Summary (Last 24 hours) at  07/13/2021 0935 ?Last data filed at 07/13/2021 0324 ?Gross per 24 hour  ?Intake 441.3 ml  ?Output 650 ml  ?Net -208.7 ml  ? ? ? ?Physical Exam ? ?Awake Alert, No new F.N deficits, flat affect,  +ve T spine pain, SLR +ve L>R ?[place].AT,PERRAL ?Supple Neck, No JVD,   ?Symmetrical Chest wall movement, Good air movement bilaterally, CTAB ?RRR,No Gallops, Rubs or new  Murmurs,  ?+ve B.Sounds, Abd Soft, No tenderness,   ?No Cyanosis, Clubbing or edema  ? ? ? ?RN pressure injury documentation: ?Pressure Injury 06/22/21 Coccyx Mid Stage 2 -  Partial thickness loss of dermis presenting as a shallow open injury with a red, pink wound bed without slough. broken skin blanching (Active)  ?06/22/21 1730  ?Location: Coccyx  ?Location Orientation: Mid  ?Staging: Stage 2 -  Partial thickness loss of dermis presenting as a shallow open injury with a red, pink wound bed without slough.  ?Wound Description (Comments): broken skin blanching  ?Present on Admission: No  ?Dressing Type Foam - Lift dressing to assess site every shift 07/12/21 2000  ? ? ? Data Review:  ? ? ?CBC ?Recent Labs  ?Lab 07/09/21 ?2143 07/10/21 ?0103 07/11/21 ?[id number] 07/12/21 ?0146 07/13/21 ?0110  ?WBC 10.8* 11.9* 11.2* 11.4* 10.8*  ?HGB 12.7* 11.9* 12.8* 12.7* 12.7*  ?HCT 38.8* 37.3* 38.5* 38.6* 39.4  ?PLT 364 369 355 351 314  ?MCV 90.9 92.3 90.6 90.6 91.4  ?MCH 29.7 29.5 30.1 29.8 29.5  ?MCHC 32.7 31.9 33.2 32.9 32.2  ?RDW 16.5* 16.6* 16.6* 16.6* 16.4*  ?LYMPHSABS  --   --  2.5 2.1 1.7  ?MONOABS  --   --  1.0 0.9 0.9  ?EOSABS  --   --  0.2 0.2 0.2  ?BASOSABS  --   --  0.1 0.1 0.1  ? ? ?Electrolytes ?Recent Labs  ?Lab 07/09/21 ?2143 07/10/21 ?0103 07/10/21 ?[id number] 07/11/21 ?[id number] 07/12/21 ?0146 07/13/21 ?0110  ?NA  --  136  --  137 138 137  ?K  --  3.2*  --  3.4* 3.1* 3.1*  ?CL  --  104  --  102 106 105  ?CO2  --  22  --  25 19* 21*  ?GLUCOSE  --  110*  --  116* 91 122*  ?BUN  --  9  --  8 10 10   ?CREATININE 0.97 1.00  --  1.51* 1.20 1.08  ?CALCIUM  --  9.0  --  9.1 9.0 9.2  ?AST  --   --   --  22 19 25   ?ALT  --   --   --  40 30 35  ?ALKPHOS  --   --   --  96 87 92  ?BILITOT  --   --   --  1.0 0.7 0.8  ?ALBUMIN  --   --   --  2.9* 2.9* 2.9*  ?MG  --   --   --  2.2 2.1 2.0  ?CRP  --   --  1.7* 1.7* 1.6* 1.5*  ?PROCALCITON  --   --  <0.10 <0.10 <0.10 <0.10  ?HGBA1C  --  6.1*  --   --   --   --   ?BNP  --   --   --  15.0 17.3 21.2   ? ? ?------------------------------------------------------------------------------------------------------------------ ?No results for input(s): CHOL, HDL, LDLCALC, TRIG, CHOLHDL, LDLDIRECT in the last 72 hou

## 2021-07-13 NOTE — Progress Notes (Signed)
Pt refusing all mobility, despite demonstrating understanding of benefits. Pt states, "not gonna happen" when told his goal is to spend some time in the chair this shift. Pt able to verbalize negative consequences of immobility, but continues to refuse. ?

## 2021-07-14 DIAGNOSIS — F4321 Adjustment disorder with depressed mood: Secondary | ICD-10-CM

## 2021-07-14 DIAGNOSIS — G4700 Insomnia, unspecified: Secondary | ICD-10-CM | POA: Diagnosis not present

## 2021-07-14 DIAGNOSIS — M4624 Osteomyelitis of vertebra, thoracic region: Secondary | ICD-10-CM | POA: Diagnosis not present

## 2021-07-14 LAB — CBC WITH DIFFERENTIAL/PLATELET
Abs Immature Granulocytes: 0.43 10*3/uL — ABNORMAL HIGH (ref 0.00–0.07)
Basophils Absolute: 0.1 10*3/uL (ref 0.0–0.1)
Basophils Relative: 1 %
Eosinophils Absolute: 0.2 10*3/uL (ref 0.0–0.5)
Eosinophils Relative: 2 %
HCT: 39.4 % (ref 39.0–52.0)
Hemoglobin: 12.7 g/dL — ABNORMAL LOW (ref 13.0–17.0)
Immature Granulocytes: 4 %
Lymphocytes Relative: 15 %
Lymphs Abs: 1.5 10*3/uL (ref 0.7–4.0)
MCH: 29.5 pg (ref 26.0–34.0)
MCHC: 32.2 g/dL (ref 30.0–36.0)
MCV: 91.4 fL (ref 80.0–100.0)
Monocytes Absolute: 0.9 10*3/uL (ref 0.1–1.0)
Monocytes Relative: 9 %
Neutro Abs: 7.2 10*3/uL (ref 1.7–7.7)
Neutrophils Relative %: 69 %
Platelets: 317 10*3/uL (ref 150–400)
RBC: 4.31 MIL/uL (ref 4.22–5.81)
RDW: 16.7 % — ABNORMAL HIGH (ref 11.5–15.5)
WBC: 10.4 10*3/uL (ref 4.0–10.5)
nRBC: 0 % (ref 0.0–0.2)

## 2021-07-14 LAB — COMPREHENSIVE METABOLIC PANEL
ALT: 37 U/L (ref 0–44)
AST: 26 U/L (ref 15–41)
Albumin: 3 g/dL — ABNORMAL LOW (ref 3.5–5.0)
Alkaline Phosphatase: 100 U/L (ref 38–126)
Anion gap: 12 (ref 5–15)
BUN: 10 mg/dL (ref 6–20)
CO2: 24 mmol/L (ref 22–32)
Calcium: 9.5 mg/dL (ref 8.9–10.3)
Chloride: 104 mmol/L (ref 98–111)
Creatinine, Ser: 0.96 mg/dL (ref 0.61–1.24)
GFR, Estimated: >60 mL/min (ref >60)
Glucose, Bld: 119 mg/dL — ABNORMAL HIGH (ref 70–99)
Potassium: 3.3 mmol/L — ABNORMAL LOW (ref 3.5–5.1)
Sodium: 140 mmol/L (ref 135–145)
Total Bilirubin: 0.7 mg/dL (ref 0.3–1.2)
Total Protein: 6.9 g/dL (ref 6.5–8.1)

## 2021-07-14 LAB — C-REACTIVE PROTEIN: CRP: 1.3 mg/dL — ABNORMAL HIGH (ref <1.0)

## 2021-07-14 LAB — PROCALCITONIN: Procalcitonin: <0.1 ng/mL

## 2021-07-14 LAB — MAGNESIUM: Magnesium: 2 mg/dL (ref 1.7–2.4)

## 2021-07-14 LAB — BRAIN NATRIURETIC PEPTIDE: B Natriuretic Peptide: 21.6 pg/mL (ref 0.0–100.0)

## 2021-07-14 MED ORDER — LACTATED RINGERS IV SOLN
INTRAVENOUS | Status: AC
Start: 2021-07-14 — End: 2021-07-14
  Filled 2021-07-14 (×2): qty 1000

## 2021-07-14 MED ORDER — HYDROMORPHONE HCL 1 MG/ML IJ SOLN
0.5000 mg | Freq: Once | INTRAMUSCULAR | Status: AC | PRN
  Administered 2021-07-16: 0.5 mg via INTRAVENOUS
  Filled 2021-07-14 (×2): qty 0.5

## 2021-07-14 MED ORDER — AQUAPHOR EX OINT
TOPICAL_OINTMENT | Freq: Two times a day (BID) | CUTANEOUS | Status: DC | PRN
  Filled 2021-07-14 (×2): qty 50

## 2021-07-14 MED ORDER — PANTOPRAZOLE SODIUM 40 MG IV SOLR
40.0000 mg | Freq: Every day | INTRAVENOUS | Status: DC
Start: 2021-07-14 — End: 2021-07-22
  Administered 2021-07-14 – 2021-07-22 (×8): 40 mg via INTRAVENOUS
  Filled 2021-07-14 (×8): qty 10

## 2021-07-14 MED ORDER — HYDRALAZINE HCL 20 MG/ML IJ SOLN: 10.0000 mg | Freq: Four times a day (QID) | INTRAMUSCULAR | Status: DC | PRN

## 2021-07-14 MED ORDER — DOCUSATE SODIUM 50 MG/5ML PO LIQD
100.0000 mg | Freq: Two times a day (BID) | ORAL | Status: DC
  Filled 2021-07-14 (×4): qty 10

## 2021-07-14 MED ORDER — ENOXAPARIN SODIUM 40 MG/0.4ML IJ SOSY
40.0000 mg | PREFILLED_SYRINGE | INTRAMUSCULAR | Status: DC
  Administered 2021-07-14 – 2021-07-15 (×2): 40 mg via SUBCUTANEOUS
  Filled 2021-07-14 (×2): qty 0.4

## 2021-07-14 NOTE — Consult Note (Signed)
[facility] Consult  ? ?Reason for Consult: Evaluate for depression ?Referring Physician: Dr. Janee Mornhompson ?Patient Identification: Michael [name] ?MRN:  [id number] ?Principal Diagnosis: Osteomyelitis of thoracic region Michael [name](HCC) ?Diagnosis:  Principal Problem: ?  Osteomyelitis of thoracic region [facility](HCC) ?Active Problems: ?  Essential hypertension ?  Thoracic discitis ?  Poor dentition ?  Nausea and vomiting ? ? ?Total Time spent with patient: 30 minutes ? ?Subjective:   ?Michael [name] is a 61 y.o. male patient admitted with Osteodiscitis.  Patient is seen and assessed, chart reviewed for this and previous admissions.  Patient states" I know why you are here.  I am not really going to kill myself.  I have too much to live for.  Killing yourself is a sin and while I may be in pain, and is not worth going to hell for.  I said something that I should not have said.  It is the way that I feel when I do not know what else to do because of the pain. "  Patient is observed to be lying in bed, with euthymic mood and affect is congruent.  Patient endorses minimal depressive symptoms, that is consistent with acute stress reaction related to osteomyelitis and unrelenting pain.  Patient is able to identify multiple protective factors to include new grand baby, son who is a Optician, dispensingminister, a daughter who would never be able to live with herself if he commits suicide, loving wife.  He strongly feels once back stimulator is placed, he will begin to participate in physical mobility and appropriate rehabilitation.  He is encouraged to attempt current physical therapies and engage in mobility evaluations, to prevent deconditioning and decompensation.  He replies he will continue to try just not today.  ? ? Pt denies SI or HI. Pt denies etoh or drug use, urine drug screen and BAL negative on admission.  Pt alert and oriented x3. Patient seen and reassessed by the psychiatric nurse practitioner; chart reviewed.  ? ?On evaluation Michael PlumberJohn  R [name] ? is observed to be lying in bed, eyes closed however he does wake up and acknowledge entry into the room.  He is able to pull himself up in bed and engage appropriately.  Patient does show willingness to participate in his treatment plan, denies having any comments questions or concerns.  He is able to verbalize wanting to participate in physical therapy and treatment(neurostimulator), and importance of taking his medications.  Patient is expressing interest in returning home, so that he can see his newborn grandchild.   Patient continues to denies suicidal ideations, homicidal ideations, and or auditory or visual hallucinations.  Patient is encouraged to utilize other coping skills for pain management, and continue to discuss with nursing when he feels suicidal.  He is able to verbalize making suicidal statements as a negative coping skill, and there are other options available.  Will psychiatrically clear at this time.  He does not appear to be responding to internal stimuli, external stimuli, and or exhibiting delusional thought disorder.    ? ?HPI:  his is a 61 year old male with a history of T8-9 osteodiscitis, diagnosed in March 2023.  Had [name] undergoing treatment with IV antibiotics, Ancef, initial disc aspirate grew E. coli, pansensitive.  He states his back pain has [name] progressively worsening and therefore he was brought to the emergency department due to severe pain.  He was having difficulty getting up due to generalized weakness, also complained of nausea and vomiting and difficulty with oral intake.  He denies any bowel or bladder changes, denies any groin numbness.  Denies any focal weakness in his legs, complains of some intermittent left calf numbness while standing. ? ? ?Past Psychiatric History: Denies ? ?Risk to Self:   Denies ?Risk to Others:   Denies ?Prior Inpatient Therapy:  Denies ?Prior Outpatient Therapy:   Denies ? ?Past Medical History:  ?Past Medical History:  ?Diagnosis  Date  ? Class 1 obesity due to excess calories with body mass index (BMI) of 34.0 to 34.9 in adult   ? Diskitis   ? Hypertension   ?  ?Past Surgical History:  ?Procedure Laterality Date  ? IR CERVICAL/THORACIC DISC ASPIRATION W/IMAG GUIDE  05/18/2021  ? IR FLUORO GUIDED NEEDLE PLC ASPIRATION/INJECTION LOC  06/13/2021  ? ?Family History: History reviewed. No pertinent family history. ?Family Psychiatric  History: Denies ?Social History:  ?Social History  ? ?Substance and Sexual Activity  ?Alcohol Use Not Currently  ?   ?Social History  ? ?Substance and Sexual Activity  ?Drug Use Never  ?  ?Social History  ? ?Socioeconomic History  ? Marital status: Married  ?  Spouse name: Not on file  ? Number of children: Not on file  ? Years of education: Not on file  ? Highest education level: Not on file  ?Occupational History  ? Occupation: truck Hospital doctor  ?Tobacco Use  ? Smoking status: Never  ? Smokeless tobacco: Never  ?Vaping Use  ? Vaping Use: Never used  ?Substance and Sexual Activity  ? Alcohol use: Not Currently  ? Drug use: Never  ? Sexual activity: Not Currently  ?Other Topics Concern  ? Not on file  ?Social History Narrative  ? Not on file  ? ?Social Determinants of Health  ? ?Financial Resource Strain: Not on file  ?Food Insecurity: Not on file  ?Transportation Needs: Not on file  ?Physical Activity: Not on file  ?Stress: Not on file  ?Social Connections: Not on file  ? ?Additional Social History: ?  ? ?Allergies:  No Known Allergies ? ?Labs:  ?Results for orders placed or performed during the hospital encounter of 07/09/21 (from the past 48 hour(s))  ?Magnesium     Status: None  ? Collection Time: 07/13/21  1:10 AM  ?Result Value Ref Range  ? Magnesium 2.0 1.7 - 2.4 mg/dL  ?  Comment: Performed at [facility], [address] [place], [place] [address]  ?C-reactive protein     Status: Abnormal  ? Collection Time: 07/13/21  1:10 AM  ?Result Value Ref Range  ? CRP 1.5 (H) <1.0 mg/dL  ?  Comment: Performed at [facility], [address], [place], [place] [address]  ?Comprehensive metabolic panel     Status: Abnormal  ? Collection Time: 07/13/21  1:10 AM  ?Result Value Ref Range  ? Sodium 137 135 - 145 mmol/L  ? Potassium 3.1 (L) 3.5 - 5.1 mmol/L  ? Chloride 105 98 - 111 mmol/L  ? CO2 21 (L) 22 - 32 mmol/L  ? Glucose, Bld 122 (H) 70 - 99 mg/dL  ?  Comment: Glucose reference range applies only to samples taken after fasting for at least 8 hours.  ? BUN 10 6 - 20 mg/dL  ? Creatinine, Ser 1.08 0.61 - 1.24 mg/dL  ? Calcium 9.2 8.9 - 10.3 mg/dL  ? Total Protein 7.0 6.5 - 8.1 g/dL  ? Albumin 2.9 (L) 3.5 - 5.0 g/dL  ? AST 25 15 - 41 U/L  ?  ALT 35 0 - 44 U/L  ? Alkaline Phosphatase 92 38 - 126 U/L  ? Total Bilirubin 0.8 0.3 - 1.2 mg/dL  ? GFR, Estimated >60 >60 mL/min  ?  Comment: (NOTE) ?Calculated using the CKD-EPI Creatinine Equation (2021) ?  ? Anion gap 11 5 - 15  ?  Comment: Performed at [name], [address], [place], [place] [address]  ?CBC with Differential/Platelet     Status: Abnormal  ? Collection Time: 07/13/21  1:10 AM  ?Result Value Ref Range  ? WBC 10.8 (H) 4.0 - 10.5 K/uL  ? RBC 4.31 4.22 - 5.81 MIL/uL  ? Hemoglobin 12.7 (L) 13.0 - 17.0 g/dL  ? HCT 39.4 39.0 - 52.0 %  ? MCV 91.4 80.0 - 100.0 fL  ? MCH 29.5 26.0 - 34.0 pg  ? MCHC 32.2 30.0 - 36.0 g/dL  ? RDW 16.4 (H) 11.5 - 15.5 %  ? Platelets 314 150 - 400 K/uL  ? nRBC 0.0 0.0 - 0.2 %  ? Neutrophils Relative % 68 %  ? Neutro Abs 7.4 1.7 - 7.7 K/uL  ? Lymphocytes Relative 16 %  ? Lymphs Abs 1.7 0.7 - 4.0 K/uL  ? Monocytes Relative 9 %  ? Monocytes Absolute 0.9 0.1 - 1.0 K/uL  ? Eosinophils Relative 2 %  ? Eosinophils Absolute 0.2 0.0 - 0.5 K/uL  ? Basophils Relative 1 %  ? Basophils Absolute 0.1 0.0 - 0.1 K/uL  ? Immature Granulocytes 4 %  ? Abs Immature Granulocytes 0.47 (H) 0.00 - 0.07 K/uL  ?  Comment: Performed at [facility], [address], [name], [place] [address]  ?Brain natriuretic peptide     Status: None  ? Collection Time:  07/13/21  1:10 AM  ?Result Value Ref Range  ? B Natriuretic Peptide 21.2 0.0 - 100.0 pg/mL  ?  Comment: Performed at [facility], [address] [place], [place] [address]  ?Procalcitonin     Statu

## 2021-07-14 NOTE — Progress Notes (Signed)
Mr. Buchinger refused his protonix, colace, and miralax. This nurse educated on the risks and benefits of takings this medication. Fitzsimmons continued to refuse. Wife at bedside also encouraged him to take medications. He stated, "I don't think I'm going to take those 3 medications." MD Candiss Norse notified.  ?

## 2021-07-14 NOTE — Progress Notes (Signed)
Talked to pharmacy about changing Colace from capsule to liquid solution due to patients refusal to take the medication whole. Pharmacy was able to change Colace to liquid, Michael [name] refused due to the taste of medication. MD Thedore Mins aware.  ?

## 2021-07-14 NOTE — TOC Initial Note (Signed)
Transition of Care (TOC) - Initial/Assessment Note  ? ? ?Patient Details  ?Name: Michael [name] ?MRN: [id number] ?Date of Birth: 07-27-1960 ? ?Transition of Care (TOC) CM/SW Contact:    ?Coralee Pesa, LCSWA ?Phone Number: ?07/14/2021, 1:24 PM ? ?Clinical Narrative:                 ? ?CSW met with pt, spouse, and sitter at bedside. CSW reviewed recommendation for SNF. CSW also advised that [facility] rehab had stated they were unsure if they can take him back due to non compliance, and non compliance in the hospital may be a barrier to insurance covering SNF. Pt's spouse states she is trying to encourage pt, but notes he is in a lot of pain. Pt is on to have surgery that pt states will help with his pain. Both spouse and pt would like for pt to return home at [place]. Spouse says they have a hospital bed and an Therapist, sports. They note pt does not need any other equipment, and they want to discuss Simpson needs when closer to DC. ? CSW asked about if SNF becomes necessary. Pt and spouse state they do not want to return to [facility] rehab, but also that they don't want any SNF at this time. TOC will continue to follow for DC needs.  ?Expected Discharge Plan: [place] ?Barriers to Discharge: Continued Medical Work up ? ? ?Patient Goals and CMS Choice ?Patient states their goals for this hospitalization and ongoing recovery are:: Pt states he wants to return home. ?  ?Choice offered to / list presented to : Patient, Spouse ? ?Expected Discharge Plan and Services ?Expected Discharge Plan: [place] ?  ?  ?Post Acute Care Choice: Home Health ?Living arrangements for the past 2 months: [place] ?Expected Discharge Date: 07/15/21               ?  ?  ?  ?  ?  ?  ?  ?  ?  ?  ? ?Prior Living Arrangements/Services ?Living arrangements for the past 2 months: [place] ?Lives with:: Spouse ?Patient language and need for interpreter reviewed:: Yes ?Do you feel safe going back to the place where you live?:  Yes      ?Need for Family Participation in Patient Care: Yes (Comment) ?Care giver support system in place?: Yes (comment) ?  ?Criminal Activity/Legal Involvement Pertinent to Current Situation/Hospitalization: No - Comment as needed ? ?Activities of Daily Living ?Home Assistive Devices/Equipment:  (was in a Rehab and just started sitting up in chair per pt) ?ADL Screening (condition at time of admission) ?Patient's cognitive ability adequate to safely complete daily activities?: Yes ?Is the patient deaf or have difficulty hearing?: No ?Does the patient have difficulty seeing, even when wearing glasses/contacts?: No ?Does the patient have difficulty concentrating, remembering, or making decisions?: No ?Patient able to express need for assistance with ADLs?: Yes ?Does the patient have difficulty dressing or bathing?: Yes ?Independently performs ADLs?: No ?Communication: Independent ?Dressing (OT): Needs assistance ?Is this a change from baseline?: Pre-admission baseline ?Grooming: Needs assistance ?Is this a change from baseline?: Pre-admission baseline ?Feeding: Independent ?Bathing: Dependent ?Is this a change from baseline?: Pre-admission baseline ?Toileting: Dependent ?Is this a change from baseline?: Pre-admission baseline ?In/Out Bed: Needs assistance ?Is this a change from baseline?: Pre-admission baseline ?Walks in Home: Needs assistance (came from Rehab and was just getting up into chair) ?Is this a change from baseline?: Pre-admission  baseline ?Does the patient have difficulty walking or climbing stairs?: Yes ?Weakness of Legs: Both ?Weakness of Arms/Hands: None ? ?Permission Sought/Granted ?Permission sought to share information with : Family Supports ?Permission granted to share information with : Yes, Verbal Permission Granted ? Share Information with NAME: Michael [name] ?   ? Permission granted to share info w Relationship: Spouse ? Permission granted to share info w Contact Information:  [phone] ? ?Emotional Assessment ?Appearance:: Appears stated age ?Attitude/Demeanor/Rapport: Engaged ?Affect (typically observed): Appropriate ?Orientation: : Oriented to Self, Oriented to Place, Oriented to  Time, Oriented to Situation ?Alcohol / Substance Use: Not Applicable ?Psych Involvement: No (comment) ? ?Admission diagnosis:  Osteomyelitis ([address]) [M86.9] ?Discitis [M46.40] ?Osteomyelitis of thoracic region [facility]) [M46.24] ?Patient Active Problem List  ? Diagnosis Date Noted  ? Osteomyelitis of thoracic region [facility]) 07/09/2021  ? Nausea and vomiting   ? Constipation   ? Pressure injury of skin 06/23/2021  ? Thoracic discitis 06/12/2021  ? Class 1 obesity due to excess calories with body mass index (BMI) of 34.0 to 34.9 in adult 06/12/2021  ? Oral thrush 06/12/2021  ? Poor dentition 06/12/2021  ? Gram-negative bacteremia 05/17/2021  ? Essential hypertension 05/16/2021  ? Obesity, Class III, BMI 40-49.9 (morbid obesity) ([place]) 05/16/2021  ? ?PCP:  Lavella Lemons, PA ?Pharmacy:   ?[facility], [place] [address] ?[address] ?[place] [address] ?Phone: [phone] Fax: [phone] ? ? ? ? ?Social Determinants of Health (SDOH) Interventions ?  ? ?Readmission Risk Interventions ?   ? View : No data to display.  ?  ?  ?  ? ? ? ?

## 2021-07-14 NOTE — Progress Notes (Signed)
Patient has made multiple comments tonight about suicidal thoughts. He has said multiple times "I wish someone would just put a pillow over my head", "I wish I could go to sleep and just not wake up", and "I want to be put out of my misery." ? ?The patient has no willingness to participate in treatment plan. He has refused multiple medications and reports helplessness. He is not wanting to be mobile with multiple attempts to educate and encourage.  ? Loney Loh MD paged.  ?

## 2021-07-14 NOTE — Progress Notes (Signed)
Overnight progress note ? ?RN requesting psychiatry evaluation due to patient endorsing suicidal thoughts. ? ?-Suicide precautions ?-Psychiatry consulted ?

## 2021-07-14 NOTE — Progress Notes (Signed)
Physical Therapy Treatment ?Patient Details ?Name: Michael [name] ?MRN: [id number] ?DOB: [birth year] ?Today's Date: 07/14/2021 ? ? ?History of Present Illness 61 y/o man with recent h/o discitis/osteomyelitis T8-9 with initial admission 3/17-3/22/23. Second admission 4/12-07/02/21 for progressive discititis and osteomyelitis. He underwent aspiration of fluid with bone bx T8-9 06/13/21. Adm 07/09/21 for worsening pain. CT refealed progressive osteomyelitis/discititis at T8-9 compared to recent studies with bony erosion and ?abscesses. Neurosurgery consult recommending surgery with pt refusing ? ?  ?PT Comments  ? ? Patient continues to initially refuse any mobility. Required assistance of wife and sitter to help persuade him to sit at EOB. Once he sat up he stayed <1 minute due to reported severe pain (again, calm demeanor, no increase in VS). Pt able to return himself to sidelying. Repositioned on Rt side for pressure relief to his sacrum.  ?   ?Recommendations for follow up therapy are one component of a multi-disciplinary discharge planning process, led by the attending physician.  Recommendations may be updated based on patient status, additional functional criteria and insurance authorization. ? ?Follow Up Recommendations ? Skilled nursing-short term rehab (<3 hours/day) ?  ?  ?Assistance Recommended at Discharge Intermittent Supervision/Assistance  ?Patient can return home with the following A lot of help with walking and/or transfers;Assistance with cooking/housework;Assist for transportation;Help with stairs or ramp for entrance ?  ?Equipment Recommendations ? None recommended by PT  ?  ?Recommendations for Other Services   ? ? ?  ?Precautions / Restrictions Precautions ?Precautions: Fall;Other (comment) ?Precaution Comments: ?back precautions for comfort ?Restrictions ?Weight Bearing Restrictions: No ?Other Position/Activity Restrictions: self limiting due to back pain  ?  ? ?Mobility ? Bed Mobility ?Overal bed  mobility: Needs Assistance ?Bed Mobility: Rolling, Sidelying to Sit, Sit to Sidelying ?Rolling: Supervision (with rail) ?Sidelying to sit: Min assist ?  ?  ?Sit to sidelying: Supervision ?General bed mobility comments: requires max encouragement to come to sit at EOB; sat <1 minute, returned to sidelying requesting help with legs but able to easily lift them onto the bed ?  ? ?Transfers ?  ?  ?  ?  ?  ?  ?  ?  ?  ?General transfer comment: patient refused to attempt ?  ? ?Ambulation/Gait ?  ?  ?  ?  ?  ?  ?  ?  ? ? ?Stairs ?  ?  ?  ?  ?  ? ? ?Wheelchair Mobility ?  ? ?Modified Rankin (Stroke Patients Only) ?  ? ? ?  ?Balance Overall balance assessment: Needs assistance ?Sitting-balance support: Feet supported, Bilateral upper extremity supported ?Sitting balance-Leahy Scale: Fair ?Sitting balance - Comments: >fair NT ?  ?  ?  ?  ?  ?  ?  ?  ?  ?  ?  ?  ?  ?  ?  ?  ? ?  ?Cognition Arousal/Alertness: Awake/alert ?Behavior During Therapy: Flat affect ?  ?  ?  ?  ?  ?  ?  ?  ?  ?  ?  ?  ?  ?  ?  ?  ?  ?General Comments: Wife present and assisting to motivate pt. He continues to refuse multiple modalities (meds, surgery). ?  ?  ? ?  ?Exercises   ? ?  ?General Comments General comments (skin integrity, edema, etc.): Wife, NT, and PT all explaining importance of mobility and ultimately he agreed to only sit EOB (which he did for <1 minute). Repositioned in rt sidelying at end of  session to alleviate pressure on sacrum ?  ?  ? ?Pertinent Vitals/Pain Pain Assessment ?Pain Assessment: Faces ?Faces Pain Scale: Hurts little more ?Pain Location: back ?Pain Descriptors / Indicators: Discomfort ?Pain Intervention(s): Limited activity within patient's tolerance, Monitored during session, Repositioned  ? ? ?Home Living   ?  ?  ?  ?  ?  ?  ?  ?  ?  ?   ?  ?Prior Function    ?  ?  ?   ? ?PT Goals (current goals can now be found in the care plan section) Acute Rehab PT Goals ?Patient Stated Goal: feel better ?PT Goal Formulation: With  patient ?Time For Goal Achievement: 07/24/21 ?Potential to Achieve Goals: Fair ?Progress towards PT goals: Not progressing toward goals - comment (pt self-limiting) ? ?  ?Frequency ? ? ? Min 3X/week ? ? ? ?  ?PT Plan Current plan remains appropriate  ? ? ?Co-evaluation   ?  ?  ?  ?  ? ?  ?AM-PAC PT "6 Clicks" Mobility   ?Outcome Measure ? Help needed turning from your back to your side while in a flat bed without using bedrails?: A Little ?Help needed moving from lying on your back to sitting on the side of a flat bed without using bedrails?: A Little ?Help needed moving to and from a bed to a chair (including a wheelchair)?: Total ?Help needed standing up from a chair using your arms (e.g., wheelchair or bedside chair)?: Total ?Help needed to walk in hospital room?: Total ?Help needed climbing 3-5 steps with a railing? : Total ?6 Click Score: 10 ? ?  ?End of Session   ?Activity Tolerance: Patient limited by pain ?Patient left: in bed;with call bell/phone within reach ?  ?PT Visit Diagnosis: Pain;Difficulty in walking, not elsewhere classified (R26.2) ?Pain - Right/Left: Right ?Pain - part of body:  (back) ?  ? ? ?Time: 6063-0160 ?PT Time Calculation (min) (ACUTE ONLY): 23 min ? ?Charges:  $Therapeutic Activity: 8-22 mins          ?          ? ? ?Jerolyn Center, PT ?Acute Rehabilitation Services  ?Pager [phone] ?Office [phone] ? ? ? ?Scherrie November Smitty Ackerley ?07/14/2021, 10:40 AM ? ?

## 2021-07-14 NOTE — Consult Note (Signed)
WOC Nurse Consult Note: ?Reason for Consult: Stage 2 pressure injury to coccyx, pressure and moisture. Patient refusing turning and repositioning.  Understands risk for skin breakdown by refusing.  He discusses his upcoming spinal surgery.  Anxious regarding an anterior approach.  Concerned with possible constipation and pain.  Wife at bedside.  Sitter at bedside.  Room is secured from hazards as patient has expressed suicidal ideation. ?Wound type:pressure and moisture to coccyx and right posterior buttocks.  ?Pressure Injury POA: Yes ?Measurement: coccyx  2 cm x 1 cm x 0.1 cm  ?Right buttocks:  1 cmx 0.2 cm x 0.2 cm  ?Wound XHB:ZJIR and moist ?Drainage (amount, consistency, odor) scant weeping ?Periwound:intact  frequently moist prolonged pressure possible due to patient refusal to turn.  ?Dressing procedure/placement/frequency: CLeanse coccyx and buttocks wounds with NS and pat dry. Apply Aquaphor to buttocks and coccyx twice daily and PRN soilage. Leave open to air and in contact with dermatherapy linen.  ?Needs mattress with low air loss feature.  ?Will not follow at this time.  Please re-consult if needed.  ?Maple Hudson MSN, RN, FNP-BC CWON ?Wound, Ostomy, Continence Nurse ?Pager [phone]  ? ?  ?

## 2021-07-14 NOTE — Progress Notes (Addendum)
Subjective: ?Patient reports suicidal ideations overnight with subsequent psych consult and suicide precautions. This morning patient stated that he is not suicidal but is overwhelemed due to his current situation and fears the possibility of needing a large back surgery. He continues to report moderate to severe back pain. ? ?Objective: ?Vital signs in last 24 hours: ?Temp:  [97.5 ?F (36.4 ?C)-98 ?F (36.7 ?C)] 97.8 ?F (36.6 ?C) (05/15 0500) ?Pulse Rate:  [88-93] 93 (05/15 0500) ?Resp:  [16-20] 16 (05/15 0500) ?BP: (134-156)/(89-96) 156/95 (05/15 0500) ?SpO2:  [94 %-97 %] 94 % (05/15 0500) ? ?Intake/Output from previous day: ?05/14 0701 - 05/15 0700 ?In: 458.5 [P.O.:240; IV Piggyback:48.5] ?Out: 1525 [Urine:1475; Emesis/NG output:50] ?Intake/Output this shift: ?Total I/O ?In: -  ?Out: 600 [Urine:600] ? ?Physical Exam: Patient is awake, A/O X 4, conversant, and in good spirits. Eyes open spontaneously. They are in NAD and VSS. Doing well. Speech is fluent and appropriate. MAEW with good strength that is symmetric bilaterally.  BUE 4+/5 throughout, BLE 4+/5 throughout. No clonus appreciated in BLE. Sensation to light touch is intact. PERLA, EOMI. CNs grossly intact.  ? ? ? ?Lab Results: ?Recent Labs  ?  07/13/21 ?0110 07/14/21 ?0137  ?WBC 10.8* 10.4  ?HGB 12.7* 12.7*  ?HCT 39.4 39.4  ?PLT 314 317  ? ?BMET ?Recent Labs  ?  07/13/21 ?0110 07/14/21 ?0137  ?NA 137 140  ?K 3.1* 3.3*  ?CL 105 104  ?CO2 21* 24  ?GLUCOSE 122* 119*  ?BUN 10 10  ?CREATININE 1.08 0.96  ?CALCIUM 9.2 9.5  ? ? ?Studies/Results: ?No results found. ? 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 He continues to have complaints of severe back pain. His neurological exam is unchanged. During my examination this morning, the patient reported that he wanted to reconsider surgery. He stated that initially he was in shock and feared such a large back surgery. The patient's wife came into the room soon  after we had begun our discussion. The patient's wife is encouraging the patient to proceed with surgery. I briefly discussed what surgery would like as well as a typical path for convalescence after surgery. I will inform Dr. Jake Samples of the patient's desire to reconsider surgery. If the patient chooses to proceed with surgery, it will likely be scheduled for Thursday. ? ? LOS: 5 days  ? ? ? ?Council Mechanic, DNP, NP-C ?07/14/2021, 6:17 AM ? ?Addendum: ?Patient seen and examined.  Agree with above. ? ?I had an extensive discussion with the patient and his son at bedside.  We discussed surgical intervention in the form of a posterior thoracic decompression, T8, T9 corpectomy, T5-11 instrumentation and fusion given the kyphosis, stenosis and collapse and bony erosion of T8-9.  I answered all of his questions.  I do believe this is a appropriate surgical plan, therefore avoiding thoracotomy and lateral approach as this was quite worrisome to the patient.  I attempted to reach his wife via phone multiple times without success.  I had previously spoken to her on Friday.  I will reaching out again tomorrow.  I am tentatively scheduling him for surgery for Thursday.  We discussed all risks, benefits and expected outcomes including but not limited to heart attack, stroke, death, temporary or permanent weakness, paralysis, spinal cord injury, spinal fluid leak, further infection, hardware failure, wound infection.  He verbalized understanding. ? ? ?Thank you for allowing me to participate in this patient's care.  Please do not hesitate to call with  questions or concerns. ? ? ?Traniece Boffa, DO ?Neurosurgeon ? [facility] ?Cell: [phone] ? ? ? ?

## 2021-07-14 NOTE — Progress Notes (Addendum)
?                                  PROGRESS NOTE                                             ?                                                                                                                     ?                                         ? ? Patient Demographics:  ? ? Michael [name], is a 61 y.o. male, DOB - 07-19-1960, [id number] ? ?Outpatient Primary MD for the patient is Michael [name], [place]    LOS - 5  Admit date - 07/09/2021   ? ?CC - back pain.    ? ?Brief Narrative (HPI from H&P)  61 y/o man with recent h/o discitis/osteomyelitis T8-9 with initial admission 3/17-3/22/23 when he was discharged on medical management with oral abx. Second admission 4/12-07/02/21 for progressive discititis and osteomyelitis. He underwent aspiration of fluid with bone bx T8-9 06/13/21. He was seen by neurosurgery and ID. He was discharged on medical management completing Rocephin 06/28/30 and switched to cefadroxil, since then he continued to have back discomfort, multiple ER visits, last ER visit at [facility] on 07/09/2021 CT [facility] suggestive of progressive osteomyelitis T8-T9 with bone erosion.  He also had questionable paravertebral phlegmon with possible abscess, he has been placed on IV antibiotics.  Neurosurgeon Dr. Kathyrn [name] was consulted over the phone who requested to be transferred to [facility]. ? ? Subjective:  ? ?Patient in bed, appears comfortable, denies any headache, no fever, no chest pain or pressure, no shortness of breath , no abdominal pain. No new focal weakness.  ? ? Assessment  & Plan :  ? ? ?Osteomyelitis of thoracic region -  Persistent and worsening osteomyelitis T8-9 now with phlegmon and question of abscesses, septic facet joints after two hospitalizations and multiple rounds of IV and oral abx.  MRI of the spine noted with worsening fluid collection suspicious for worsening discitis and abscess. ? ?He was seen by IR, neurosurgery and ID.  ID  is directing the antibiotic treatment, he initially refused neurosurgical intervention but now agrees to it, does not want to use TLSO brace.  Per IR aspirations do not help in this clinical scenario.  Form neurosurgery and they will see him again on 07/14/2021 for possible surgical intervention middle of next week.  Continue supportive care.  Still does not follow medication recommendations and refusing a lot of medicines from time to time.  Refused  long-acting narcotic and does not want oral Neurontin. ? ?Chronic nausea and vomiting - Intermittent recurring problem. GI saw patient during April admission. Patient declined EGD, IV PPI and supportive care.  He is also very noncompliant with medications, repeatedly refusing multiple medications even saline spray for intermittent nosebleed, counseled him and his wife.  They understand that if he continues to refuse medications, saline spray, PT, OT, surgery, TLSO brace that it is nothing much that we can provide in eventually he might require comfort treatment only.  He has refused to be placed on depression medications as well of note he was seen by psychiatry last admission for similar issues. ? ? ?Suicidal ideation night of 07/13/2021.  Bedside sitter, psych eval.   ? ?Poor dentition - Long standing problem. No visible infections.  ? ?Patient with oral candidiasis - Nystatin swish and spit ? ?Essential hypertension - blood pressure soft continue on as needed hydralazine only for now. ? ?Obesity .  BMI of 38.  Follow with PCP for weight loss. ?  ?Hypokalemia and hypomagnesemia.  IV, refuses replacement supplementation frequently. ? ?AKI - IVF, Renal US , switch Vanco to Dapto. Ur lytes.  AKI has resolved. ? ?GERD.  On PPI. ? ?   ? ?Condition - Extremely Guarded ? ?Family Communication  :  ? ?called wife Michael [name] [phone] on 07/11/2021 at 11:52 AM and message left, called 07/12/21 ? ?Code Status :  Full ? ?Consults  :  NS, ID, IR ? ?PUD Prophylaxis : PPI ? ? Procedures   :    ? ?MRI T Spine - Discitis and osteomyelitis at T8-9. Progressive bony erosion since the prior MRI of 06/12/2021 but no change from the CT of 07/08/2021. Progressive spinal stenosis with moderate spinal stenosis, cord compression, and cord hyperintensity which has developed in the interval. Increased fluid in the disc space compatible with infection. Paraspinous soft tissue thickening and multiple small paraspinous soft tissue abscesses similar to the prior study  ? ?   ? ?Disposition Plan  :   ? ?Status is: Inpatient ? ?DVT Prophylaxis  :   ? ?heparin injection 5,000 Units Start: 07/09/21 2200 ?  ? ?Lab Results  ?Component Value Date  ? PLT 317 07/14/2021  ? ? ?Diet :  ?Diet Order   ? ?       ?  Diet Heart Room service appropriate? Yes; Fluid consistency: Thin  Diet effective now       ?  ? ?  ?  ? ?  ?  ? ?Inpatient Medications ? ?Scheduled Meds: ? docusate sodium  100 mg Oral BID  ? feeding supplement  237 mL Oral BID BM  ? heparin  5,000 Units Subcutaneous Q8H  ? nystatin  5 mL Mouth/Throat QID  ? pantoprazole  40 mg Oral Daily  ? polyethylene glycol  17 g Oral Daily  ? ?Continuous Infusions: ? cefTRIAXone (ROCEPHIN)  IV 2 g (07/13/21 1337)  ? DAPTOmycin (CUBICIN)  IV 750 mg (07/13/21 2048)  ? lactated ringers with kcl    ? ?PRN Meds:.acetaminophen **OR** acetaminophen, bisacodyl, HYDROmorphone (DILAUDID) injection, LORazepam, mineral oil-hydrophilic petrolatum, morphine injection, ondansetron (ZOFRAN) IV, traZODone ? ?Time Spent in minutes  30 ? ? ?Michael [name] M.D on 07/14/2021 at 10:12 AM ? ?To page go to [email or web address]  ? ?[facility] -  Office  [phone] ? ?See all Orders from today for further details ? ? ? Objective:  ? ?Vitals:  ? 07/13/21 2312 07/13/21 2330 07/14/21 0500 07/14/21 0821  ?  BP: (!) 149/94 (!) 146/93 (!) 156/95 (!) 156/104  ?Pulse: 88 89 93 89  ?Resp: 16 17 16 17   ?Temp: 98 ?F (36.7 ?C) 97.7 ?F (36.5 ?C) 97.8 ?F (36.6 ?C)   ?TempSrc: Oral Oral Oral   ?SpO2: 97% 96% 94% 96%   ?Weight:      ?Height:      ? ? ?Wt Readings from Last 3 Encounters:  ?07/09/21 125.9 kg  ?07/02/21 125.9 kg  ?05/29/21 (!) 149.2 kg  ? ? ? ?Intake/Output Summary (Last 24 hours) at 07/14/2021 1012 ?Last data filed at 07/14/2021 0100 ?Gross per 24 hour  ?Intake 458.48 ml  ?Output 1475 ml  ?Net -1016.52 ml  ? ? ? ?Physical Exam ? ?Awake Alert, No new F.N deficits, flat affect,  +ve T spine pain, SLR +ve L>R ?[place].AT,PERRAL ?Supple Neck, No JVD,   ?Symmetrical Chest wall movement, Good air movement bilaterally, CTAB ?RRR,No Gallops, Rubs or new Murmurs,  ?+ve B.Sounds, Abd Soft, No tenderness,   ?No Cyanosis, Clubbing or edema   ? ? ? ?RN pressure injury documentation: ?Pressure Injury 06/22/21 Coccyx Mid Stage 2 -  Partial thickness loss of dermis presenting as a shallow open injury with a red, pink wound bed without slough. broken skin blanching (Active)  ?06/22/21 1730  ?Location: Coccyx  ?Location Orientation: Mid  ?Staging: Stage 2 -  Partial thickness loss of dermis presenting as a shallow open injury with a red, pink wound bed without slough.  ?Wound Description (Comments): broken skin blanching  ?Present on Admission: No  ?Dressing Type Foam - Lift dressing to assess site every shift 07/12/21 2000  ? ? ? Data Review:  ? ? ?CBC ?Recent Labs  ?Lab 07/10/21 ?0103 07/11/21 ?[id number] 07/12/21 ?0146 07/13/21 ?0110 07/14/21 ?0137  ?WBC 11.9* 11.2* 11.4* 10.8* 10.4  ?HGB 11.9* 12.8* 12.7* 12.7* 12.7*  ?HCT 37.3* 38.5* 38.6* 39.4 39.4  ?PLT 369 355 351 314 317  ?MCV 92.3 90.6 90.6 91.4 91.4  ?MCH 29.5 30.1 29.8 29.5 29.5  ?MCHC 31.9 33.2 32.9 32.2 32.2  ?RDW 16.6* 16.6* 16.6* 16.4* 16.7*  ?LYMPHSABS  --  2.5 2.1 1.7 1.5  ?MONOABS  --  1.0 0.9 0.9 0.9  ?EOSABS  --  0.2 0.2 0.2 0.2  ?BASOSABS  --  0.1 0.1 0.1 0.1  ? ? ?Electrolytes ?Recent Labs  ?Lab 07/10/21 ?0103 07/10/21 ?[id number] 07/11/21 ?[id number] 07/12/21 ?0146 07/13/21 ?0110 07/14/21 ?0137  ?NA 136  --  137 138 137 140  ?K 3.2*  --  3.4* 3.1* 3.1* 3.3*  ?CL 104  --  102 106 105  104  ?CO2 22  --  25 19* 21* 24  ?GLUCOSE 110*  --  116* 91 122* 119*  ?BUN 9  --  8 10 10 10   ?CREATININE 1.00  --  1.51* 1.20 1.08 0.96  ?CALCIUM 9.0  --  9.1 9.0 9.2 9.5  ?AST  --   --  22 19 25 26   ?AL

## 2021-07-15 DIAGNOSIS — M4624 Osteomyelitis of vertebra, thoracic region: Secondary | ICD-10-CM | POA: Diagnosis not present

## 2021-07-15 LAB — CBC WITH DIFFERENTIAL/PLATELET
Abs Immature Granulocytes: 0.49 10*3/uL — ABNORMAL HIGH (ref 0.00–0.07)
Basophils Absolute: 0.2 10*3/uL — ABNORMAL HIGH (ref 0.0–0.1)
Basophils Relative: 2 %
Eosinophils Absolute: 0.2 10*3/uL (ref 0.0–0.5)
Eosinophils Relative: 2 %
HCT: 40.2 % (ref 39.0–52.0)
Hemoglobin: 13.1 g/dL (ref 13.0–17.0)
Immature Granulocytes: 5 %
Lymphocytes Relative: 22 %
Lymphs Abs: 2.2 10*3/uL (ref 0.7–4.0)
MCH: 29.7 pg (ref 26.0–34.0)
MCHC: 32.6 g/dL (ref 30.0–36.0)
MCV: 91.2 fL (ref 80.0–100.0)
Monocytes Absolute: 1.1 10*3/uL — ABNORMAL HIGH (ref 0.1–1.0)
Monocytes Relative: 11 %
Neutro Abs: 6 10*3/uL (ref 1.7–7.7)
Neutrophils Relative %: 58 %
Platelets: 293 10*3/uL (ref 150–400)
RBC: 4.41 MIL/uL (ref 4.22–5.81)
RDW: 16.9 % — ABNORMAL HIGH (ref 11.5–15.5)
WBC: 10 10*3/uL (ref 4.0–10.5)
nRBC: 0 % (ref 0.0–0.2)

## 2021-07-15 LAB — PROCALCITONIN: Procalcitonin: <0.1 ng/mL

## 2021-07-15 LAB — CULTURE, BLOOD (ROUTINE X 2)
Culture: NO GROWTH
Culture: NO GROWTH
Special Requests: ADEQUATE
Special Requests: ADEQUATE

## 2021-07-15 LAB — COMPREHENSIVE METABOLIC PANEL
ALT: 43 U/L (ref 0–44)
AST: 33 U/L (ref 15–41)
Albumin: 3 g/dL — ABNORMAL LOW (ref 3.5–5.0)
Alkaline Phosphatase: 92 U/L (ref 38–126)
Anion gap: 10 (ref 5–15)
BUN: 8 mg/dL (ref 6–20)
CO2: 22 mmol/L (ref 22–32)
Calcium: 9.1 mg/dL (ref 8.9–10.3)
Chloride: 106 mmol/L (ref 98–111)
Creatinine, Ser: 0.94 mg/dL (ref 0.61–1.24)
GFR, Estimated: >60 mL/min (ref >60)
Glucose, Bld: 114 mg/dL — ABNORMAL HIGH (ref 70–99)
Potassium: 3.5 mmol/L (ref 3.5–5.1)
Sodium: 138 mmol/L (ref 135–145)
Total Bilirubin: 0.6 mg/dL (ref 0.3–1.2)
Total Protein: 6.7 g/dL (ref 6.5–8.1)

## 2021-07-15 MED ORDER — POTASSIUM CHLORIDE 2 MEQ/ML IV SOLN
INTRAVENOUS | Status: AC
  Filled 2021-07-15: qty 1000

## 2021-07-15 NOTE — Progress Notes (Signed)
?                                  PROGRESS NOTE                                             ?                                                                                                                     ?                                         ? ? Patient Demographics:  ? ? Michael [name], is a 61 y.o. male, DOB - [birth year], ZOX:[id number] ? ?Outpatient Primary MD for the patient is Lovey NewcomerBoyd, William [place], [place]    LOS - 6  Admit date - 07/09/2021   ? ?CC - back pain.    ? ?Brief Narrative (HPI from H&P)  61 y/o man with recent h/o discitis/osteomyelitis T8-9 with initial admission 3/17-3/22/23 when he was discharged on medical management with oral abx. Second admission 4/12-07/02/21 for progressive discititis and osteomyelitis. He underwent aspiration of fluid with bone bx T8-9 06/13/21. He was seen by neurosurgery and ID. He was discharged on medical management completing Rocephin 06/28/30 and switched to cefadroxil, since then he continued to have back discomfort, multiple ER visits, [facility] 07[facility]09/20[facility] suggestive of progressive osteomyelitis T8-T9 with bone erosion.  He also had questionable paravertebral phlegmon with possible abscess, he has been placed on IV antibiotics.  Neurosurgeon Dr. Conchita ParisNundkumar was consulted over the phone who [facility] be [facility]. ? ? Subjective:  ? ?Patient in bed denies any headache, no chest or abdominal pain, low back pain continues, no bowel or bladder incontinence.  No focal weakness. ? ? Assessment  & Plan :  ? ? ?Osteomyelitis of thoracic region -  Persistent and worsening osteomyelitis T8-9 now with phlegmon and question of abscesses, septic facet joints after two hospitalizations and multiple rounds of IV and oral abx.  MRI of the spine noted with worsening fluid collection suspicious for worsening discitis and abscess.  Noncompliant with recommendations and medications for the last  several months.   ? ?He was seen by IR, neurosurgery and ID.  ID is directing the antibiotic treatment, he initially refused neurosurgical intervention but now agrees to it, does not want to use TLSO brace, he also refused taking long-acting narcotic and does not want to take Neurontin anymore.  Per neurosurgery surgical intervention scheduled for 07/17/2021, he is going back and forth between not agreeing for surgery, have discussed this with wife as well.  She said she has  tried her maximum 2 and is not much she can do.  Postop neuro surgery wants to take the patient to ICU for 48 hours.  Kindly inform ICU team Thursday morning. ? ? ?Chronic nausea and vomiting - Intermittent recurring problem. GI saw patient during April admission. Patient declined EGD, IV PPI and supportive care.  He is also very noncompliant with medications, repeatedly refusing multiple medications even saline spray for intermittent nosebleed, counseled him and his wife.  They understand that if he continues to refuse medications, saline spray, PT, OT, surgery, TLSO brace that it is nothing much that we can provide in eventually he might require comfort treatment only.  He has refused to be placed on depression medications as well of note he was seen by psychiatry last admission for similar issues, also seen by psych this admission. ? ? ?Suicidal ideation night of 07/13/2021.  Reviewed by psych on 07/14/2021. ? ?Poor dentition - Long standing problem. No visible infections.  ? ?Patient with oral candidiasis - Nystatin swish and spit ? ?Essential hypertension - blood pressure soft continue on as needed hydralazine only for now. ? ?Obesity .  BMI of 38.  Follow with PCP for weight loss. ?  ?Hypokalemia and hypomagnesemia.  Replaced.  He refuses to take oral supplementations. ? ?AKI - IVF, Renal US , switch Vanco to Dapto. Ur lytes.  AKI has resolved. ? ?GERD.  On PPI. ? ?   ? ?Condition - Extremely Guarded ? ?Family Communication  :  ? ?called wife  Michael [name] [phone] on 07/11/2021 at 11:52 AM and message left, called 07/12/21, updated 07/14/2021 ? ?Code Status :  Full ? ?Consults  :  NS, ID, IR ? ?PUD Prophylaxis : PPI ? ? Procedures  :    ? ?MRI T Spine - Discitis and osteomyelitis at T8-9. Progressive bony erosion since the prior MRI of 06/12/2021 but no change from the CT of 07/08/2021. Progressive spinal stenosis with moderate spinal stenosis, cord compression, and cord hyperintensity which has developed in the interval. Increased fluid in the disc space compatible with infection. Paraspinous soft tissue thickening and multiple small paraspinous soft tissue abscesses similar to the prior study  ? ?   ? ?Disposition Plan  :   ? ?Status is: Inpatient ? ?DVT Prophylaxis  :   ? ?enoxaparin (LOVENOX) injection 40 mg Start: 07/14/21 2230 ?  ? ?Lab Results  ?Component Value Date  ? PLT 293 07/15/2021  ? ? ?Diet :  ?Diet Order   ? ?       ?  Diet Heart Room service appropriate? Yes; Fluid consistency: Thin  Diet effective now       ?  ? ?  ?  ? ?  ?  ? ?Inpatient Medications ? ?Scheduled Meds: ? docusate  100 mg Oral BID  ? enoxaparin (LOVENOX) injection  40 mg Subcutaneous Q24H  ? feeding supplement  237 mL Oral BID BM  ? nystatin  5 mL Mouth/Throat QID  ? pantoprazole (PROTONIX) IV  40 mg Intravenous Daily  ? polyethylene glycol  17 g Oral Daily  ? ?Continuous Infusions: ? cefTRIAXone (ROCEPHIN)  IV 2 g (07/14/21 1445)  ? DAPTOmycin (CUBICIN)  IV 750 mg (07/14/21 2131)  ? lactated ringers with kcl 100 mL/hr at 07/15/21 0818  ? ?PRN Meds:.acetaminophen **OR** acetaminophen, bisacodyl, hydrALAZINE, HYDROmorphone (DILAUDID) injection, LORazepam, mineral oil-hydrophilic petrolatum, morphine injection, ondansetron (ZOFRAN) IV, traZODone ? ?Time Spent in minutes  30 ? ? ?Susa Raring M.D on 07/15/2021 at 11:20  AM ? ?To page go to [email or web address]  ? ?[facility] -  Office  [phone] ? ?See all Orders from today for further details ? ? ? Objective:  ? ?Vitals:  ?  07/15/21 0000 07/15/21 0100 07/15/21 0444 07/15/21 0820  ?BP: 131/89 131/89 (!) 128/98 (!) 136/98  ?Pulse: 87 95 89 94  ?Resp: 17 17 18 18   ?Temp: 97.8 ?F (36.6 ?C)  97.8 ?F (36.6 ?C) 97.6 ?F (36.4 ?C)  ?TempSrc: Oral  Oral Oral  ?SpO2: 94% 91% 95% 95%  ?Weight:      ?Height:      ? ? ?Wt Readings from Last 3 Encounters:  ?07/09/21 125.9 kg  ?07/02/21 125.9 kg  ?05/29/21 (!) 149.2 kg  ? ? ? ?Intake/Output Summary (Last 24 hours) at 07/15/2021 1120 ?Last data filed at 07/15/2021 0600 ?Gross per 24 hour  ?Intake 540.94 ml  ?Output 1211 ml  ?Net -670.06 ml  ? ? ? ?Physical Exam ? ?Awake Alert, No new F.N deficits, flat affect,  +ve T spine pain, SLR +ve L>R ?[place].AT,PERRAL ?Supple Neck, No JVD,   ?Symmetrical Chest wall movement, Good air movement bilaterally, CTAB ?RRR,No Gallops, Rubs or new Murmurs,  ?+ve B.Sounds, Abd Soft, No tenderness,   ?No Cyanosis, Clubbing or edema  ? ? ? ? ?RN pressure injury documentation: ?Pressure Injury 06/22/21 Coccyx Mid Stage 2 -  Partial thickness loss of dermis presenting as a shallow open injury with a red, pink wound bed without slough. broken skin blanching (Active)  ?06/22/21 1730  ?Location: Coccyx  ?Location Orientation: Mid  ?Staging: Stage 2 -  Partial thickness loss of dermis presenting as a shallow open injury with a red, pink wound bed without slough.  ?Wound Description (Comments): broken skin blanching  ?Present on Admission: No  ?Dressing Type Foam - Lift dressing to assess site every shift 07/14/21 2000  ? ? ? Data Review:  ? ? ?CBC ?Recent Labs  ?Lab 07/11/21 ?09/10/21 07/12/21 ?0146 07/13/21 ?0110 07/14/21 ?0137 07/15/21 ?0136  ?WBC 11.2* 11.4* 10.8* 10.4 10.0  ?HGB 12.8* 12.7* 12.7* 12.7* 13.1  ?HCT 38.5* 38.6* 39.4 39.4 40.2  ?PLT 355 351 314 317 293  ?MCV 90.6 90.6 91.4 91.4 91.2  ?MCH 30.1 29.8 29.5 29.5 29.7  ?MCHC 33.2 32.9 32.2 32.2 32.6  ?RDW 16.6* 16.6* 16.4* 16.7* 16.9*  ?LYMPHSABS 2.5 2.1 1.7 1.5 2.2  ?MONOABS 1.0 0.9 0.9 0.9 1.1*  ?EOSABS 0.2 0.2 0.2 0.2 0.2   ?BASOSABS 0.1 0.1 0.1 0.1 0.2*  ? ? ?Electrolytes ?Recent Labs  ?Lab 07/10/21 ?0103 07/10/21 ?09/09/21 07/10/21 ?09/09/21 07/11/21 ?09/10/21 07/12/21 ?0146 07/13/21 ?0110 07/14/21 ?0137 07/15/21 ?0136  ?NA 136  --   -

## 2021-07-15 NOTE — Progress Notes (Signed)
Patient was seen and examined today.  He remains quite depressed and tearful during interview. ? ?His physical exam remained stable.  Bilateral lower extremities 4+/5 throughout.  There is no clonus.  There is no decrease sensation to light touch throughout his trunk or lower extremities. ? ?We had extensive discussion about surgical plan given his thoracic kyphosis, stenosis.  Surgical plan is a posterior decompression for stenosis, with instrumentation and fusion T5-T11.  I will use a costotransversectomy approach to perform a T8 and T9 corpectomy.  We discussed all risks, benefits and expected outcomes, including but not limited to infection, bleeding, spinal cord injury, weakness, paralysis, spinal fluid leak, heart attack, stroke, death.  He understands this and would like to proceed with surgical intervention.  I also called his wife over the phone and answered all of her questions and discussed the surgical plan in detail. ? ?He is scheduled for surgical intervention at 7:30 AM on Thursday, 07/17/2021.  We will make him n.p.o. at midnight tomorrow as well as stop his subcu heparin tomorrow night. ? ? ?Thank you for allowing me to participate in this patient's care.  Please do not hesitate to call with questions or concerns. ? ? ?Sheera Illingworth, DO ?Neurosurgeon ?[facility] ?Cell: [phone] ? ?

## 2021-07-16 DIAGNOSIS — M4644 Discitis, unspecified, thoracic region: Secondary | ICD-10-CM

## 2021-07-16 DIAGNOSIS — I10 Essential (primary) hypertension: Secondary | ICD-10-CM | POA: Diagnosis not present

## 2021-07-16 DIAGNOSIS — M4624 Osteomyelitis of vertebra, thoracic region: Secondary | ICD-10-CM | POA: Diagnosis not present

## 2021-07-16 LAB — PROTIME-INR
INR: 1.1 (ref 0.8–1.2)
Prothrombin Time: 14.2 seconds (ref 11.4–15.2)

## 2021-07-16 LAB — CBC WITH DIFFERENTIAL/PLATELET
Abs Immature Granulocytes: 0.51 10*3/uL — ABNORMAL HIGH (ref 0.00–0.07)
Basophils Absolute: 0.1 10*3/uL (ref 0.0–0.1)
Basophils Relative: 1 %
Eosinophils Absolute: 0.3 10*3/uL (ref 0.0–0.5)
Eosinophils Relative: 3 %
HCT: 42.2 % (ref 39.0–52.0)
Hemoglobin: 14.1 g/dL (ref 13.0–17.0)
Immature Granulocytes: 5 %
Lymphocytes Relative: 22 %
Lymphs Abs: 2.4 10*3/uL (ref 0.7–4.0)
MCH: 30.6 pg (ref 26.0–34.0)
MCHC: 33.4 g/dL (ref 30.0–36.0)
MCV: 91.5 fL (ref 80.0–100.0)
Monocytes Absolute: 1.2 10*3/uL — ABNORMAL HIGH (ref 0.1–1.0)
Monocytes Relative: 11 %
Neutro Abs: 6.4 10*3/uL (ref 1.7–7.7)
Neutrophils Relative %: 58 %
Platelets: 294 10*3/uL (ref 150–400)
RBC: 4.61 MIL/uL (ref 4.22–5.81)
RDW: 17.4 % — ABNORMAL HIGH (ref 11.5–15.5)
WBC: 10.9 10*3/uL — ABNORMAL HIGH (ref 4.0–10.5)
nRBC: 0 % (ref 0.0–0.2)

## 2021-07-16 LAB — COMPREHENSIVE METABOLIC PANEL
ALT: 44 U/L (ref 0–44)
AST: 30 U/L (ref 15–41)
Albumin: 3.1 g/dL — ABNORMAL LOW (ref 3.5–5.0)
Alkaline Phosphatase: 94 U/L (ref 38–126)
Anion gap: 9 (ref 5–15)
BUN: 6 mg/dL (ref 6–20)
CO2: 24 mmol/L (ref 22–32)
Calcium: 9.3 mg/dL (ref 8.9–10.3)
Chloride: 105 mmol/L (ref 98–111)
Creatinine, Ser: 0.94 mg/dL (ref 0.61–1.24)
GFR, Estimated: >60 mL/min (ref >60)
Glucose, Bld: 102 mg/dL — ABNORMAL HIGH (ref 70–99)
Potassium: 3.6 mmol/L (ref 3.5–5.1)
Sodium: 138 mmol/L (ref 135–145)
Total Bilirubin: 0.2 mg/dL — ABNORMAL LOW (ref 0.3–1.2)
Total Protein: 6.9 g/dL (ref 6.5–8.1)

## 2021-07-16 MED ORDER — CEFAZOLIN IN SODIUM CHLORIDE 3-0.9 GM/100ML-% IV SOLN
3.0000 g | INTRAVENOUS | Status: AC
  Administered 2021-07-17 (×2): 3 g via INTRAVENOUS
  Filled 2021-07-16: qty 100

## 2021-07-16 NOTE — Progress Notes (Signed)
Subjective: ?Patient reports that he is continuing to have a significant amount of back pain. He is in better spirits this morning but continues to express that he is anxious about his surgery.  ? ?Objective: ?Vital signs in last 24 hours: ?Temp:  [97.6 ?F (36.4 ?C)-98.6 ?F (37 ?C)] 98.3 ?F (36.8 ?C) (05/17 0801) ?Pulse Rate:  [86-104] 97 (05/17 0801) ?Resp:  [16-20] 18 (05/17 0801) ?BP: (122-147)/(72-128) 143/97 (05/17 0801) ?SpO2:  [92 %-98 %] 97 % (05/17 0801) ? ?Intake/Output from previous day: ?05/16 0701 - 05/17 0700 ?In: -  ?Out: 601 [Urine:601] ?Intake/Output this shift: ?No intake/output data recorded. ? ? ?Physical Exam: Patient is awake, A/O X 4, conversant, and in good spirits. Eyes open spontaneously. They are in NAD and VSS. Doing well. Speech is fluent and appropriate. MAEW with good strength that is symmetric bilaterally.  BUE 4+/5 throughout, BLE 4+/5 throughout. No clonus appreciated in BLE. Sensation to light touch is intact. PERLA, EOMI. CNs grossly intact.  ? ? ?Lab Results: ?Recent Labs  ?  07/15/21 ?0136 07/16/21 ?0118  ?WBC 10.0 10.9*  ?HGB 13.1 14.1  ?HCT 40.2 42.2  ?PLT 293 294  ? ?BMET ?Recent Labs  ?  07/15/21 ?0136 07/16/21 ?0118  ?NA 138 138  ?K 3.5 3.6  ?CL 106 105  ?CO2 22 24  ?GLUCOSE 114* 102*  ?BUN 8 6  ?CREATININE 0.94 0.94  ?CALCIUM 9.1 9.3  ? ? ?Studies/Results: ?No results found. ? 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 His pain continues to be intractable. His neurological examination is stable. Plan for posterior decompression for stenosis, with instrumentation and fusion T5-T11 with Dr. Jake Samples tomorrow morning at 0730.  ? ? ?-NPO at midnight for surgery in the morning ?-Stop SQ heparin today ? ? LOS: 7 days  ? ? ? ?Council Mechanic, DNP, NP-C ?07/16/2021, 8:02 AM ? ? ? ? ?

## 2021-07-16 NOTE — Progress Notes (Signed)
Occupational Therapy Treatment ?Patient Details ?Name: Michael [name] ?MRN: [id number] ?DOB: [birth year] ?Today's Date: 07/16/2021 ? ? ?History of present illness 61 y/o man with recent h/o discitis/osteomyelitis T8-9 with initial admission 3/17-3/22/23. Second admission 4/12-07/02/21 for progressive discititis and osteomyelitis. He underwent aspiration of fluid with bone bx T8-9 06/13/21. Adm 07/09/21 for worsening pain. CT refealed progressive osteomyelitis/discititis at T8-9 compared to recent studies with bony erosion and ?abscesses. Neurosurgery consult recommending surgery with pt refusing ?  ?OT comments ? Pt continues to require maximum encouragement to work with therapies. Reports pins and needles feeling along lateral trunk when sitting up. Sat with min guard assist while participating in grooming and UB bathing. Initially stating he could not sit without B UE support, but later demonstrated he could. Pt describes pain as "excrutiating," however facial expression and vital signs do not indicate change in pain with sitting. RN did provide pain meds and pt encouraged to treat pain if necessary to mobilize. Pt with poor insight.   ? ?Recommendations for follow up therapy are one component of a multi-disciplinary discharge planning process, led by the attending physician.  Recommendations may be updated based on patient status, additional functional criteria and insurance authorization. ?   ?Follow Up Recommendations ? Skilled nursing-short term rehab (<3 hours/day)  ?  ?Assistance Recommended at Discharge Frequent or constant Supervision/Assistance  ?Patient can return home with the following ? A lot of help with bathing/dressing/bathroom;Assistance with cooking/housework;Assist for transportation;Help with stairs or ramp for entrance;Two people to help with walking and/or transfers ?  ?Equipment Recommendations ? Other (comment) (defer to next venue)  ?  ?Recommendations for Other Services   ? ?  ?Precautions /  Restrictions Precautions ?Precautions: Fall;Other (comment) ?Precaution Comments: ?back precautions for comfort ?Restrictions ?Weight Bearing Restrictions: No ?Other Position/Activity Restrictions: self limiting due to back pain he describes as excrutiating yet does not ask for pain meds  ? ? ?  ? ?Mobility Bed Mobility ?Overal bed mobility: Needs Assistance ?Bed Mobility: Rolling, Sidelying to Sit, Sit to Sidelying ?Rolling: Min assist ?Sidelying to sit: Min assist ?  ?  ?Sit to sidelying: Min guard ?General bed mobility comments: requires max encouragement to come to sit at EOB; min assist to initiate movements and then pt takes over;  sat EOB 5 minutes, returned to sidelying requesting help with legs but able to lift them onto the bed without assist ?  ? ?Transfers ?  ?  ?  ?  ?  ?  ?  ?  ?  ?General transfer comment: patient refused to attempt ?  ?  ?Balance Overall balance assessment: Needs assistance ?Sitting-balance support: Feet supported, Single extremity supported ?Sitting balance-Leahy Scale: Poor ?Sitting balance - Comments: states he feels he cannot sit without UE support ?  ?  ?  ?  ?  ?  ?  ?  ?  ?  ?  ?  ?  ?  ?  ?   ? ?ADL either performed or assessed with clinical judgement  ? ?ADL Overall ADL's : Needs assistance/impaired ?Eating/Feeding: Independent;Bed level ?Eating/Feeding Details (indicate cue type and reason): sips water and spits it into a cup ?Grooming: Wash/dry hands;Wash/dry face;Sitting;Min guard ?  ?Upper Body Bathing: Moderate assistance;Sitting ?Upper Body Bathing Details (indicate cue type and reason): washed and dried back ?  ?  ?  ?  ?Lower Body Dressing: Total assistance;Bed level ?  ?  ?  ?  ?  ?  ?  ?  ?General ADL Comments: pt  initially stating he could not wash his face as he needed B UEs to support himself at EOB, but was able to release one hand without LOB ?  ? ?Extremity/Trunk Assessment   ?  ?  ?  ?  ?  ? ?Vision   ?  ?  ?Perception   ?  ?Praxis   ?  ? ?Cognition  Arousal/Alertness: Awake/alert ?Behavior During Therapy: Flat affect ?Overall Cognitive Status: No family/caregiver present to determine baseline cognitive functioning ?  ?  ?  ?  ?  ?  ?  ?  ?  ?  ?  ?  ?  ?  ?  ?  ?General Comments: pt doesn't want to take pain medicine because of risk of constipation, yet refuses to stand or walk to assist with gut motility. States he understands the need to mobilize but cannot "wrap his head around it" due to pain, requires maximum encouragement. ?  ?  ?   ?Exercises   ? ?  ?Shoulder Instructions   ? ? ?  ?General Comments    ? ? ?Pertinent Vitals/ Pain       Pain Assessment ?Pain Assessment: Faces ?Faces Pain Scale: Hurts even more ?Pain Location: back ?Pain Descriptors / Indicators: Discomfort ?Pain Intervention(s): Repositioned, Monitored during session ? ?Home Living   ?  ?  ?  ?  ?  ?  ?  ?  ?  ?  ?  ?  ?  ?  ?  ?  ?  ?  ? ?  ?Prior Functioning/Environment    ?  ?  ?  ?   ? ?Frequency ? Min 2X/week  ? ? ? ? ?  ?Progress Toward Goals ? ?OT Goals(current goals can now be found in the care plan section) ? Progress towards OT goals: Not progressing toward goals - comment (self limiting) ? ?Acute Rehab OT Goals ?OT Goal Formulation: With patient ?Time For Goal Achievement: 07/24/21 ?Potential to Achieve Goals: Fair  ?Plan Discharge plan remains appropriate;Frequency remains appropriate   ? ?Co-evaluation ? ? ? PT/OT/SLP Co-Evaluation/Treatment: Yes ?Reason for Co-Treatment: Necessary to address cognition/behavior during functional activity (pt self limiting) ?  ?OT goals addressed during session: ADL's and self-care ?  ? ?  ?AM-PAC OT "6 Clicks" Daily Activity     ?Outcome Measure ? ? Help from another person eating meals?: None ?Help from another person taking care of personal grooming?: A Little ?Help from another person toileting, which includes using toliet, bedpan, or urinal?: A Lot ?Help from another person bathing (including washing, rinsing, drying)?: A Lot ?Help from  another person to put on and taking off regular upper body clothing?: A Little ?Help from another person to put on and taking off regular lower body clothing?: A Lot ?6 Click Score: 16 ? ?  ?End of Session   ? ?OT Visit Diagnosis: Pain;Other symptoms and signs involving cognitive function;Muscle weakness (generalized) (M62.81) ?  ?Activity Tolerance Other (comment) (self limiting) ?  ?Patient Left in bed;with call bell/phone within reach;with bed alarm set ?  ?Nurse Communication Mobility status;Patient requests pain meds ?  ? ?   ? ?Time: 8250-5397 ?OT Time Calculation (min): 22 min ? ?Charges: OT General Charges ?$OT Visit: 1 Visit ? ?Martie Round, OTR/L ?Acute Rehabilitation Services ?Pager: [phone] ?Office: [phone]  ?Evern Bio ?07/16/2021, 11:32 AM ?

## 2021-07-16 NOTE — Progress Notes (Signed)
Physical Therapy Treatment ?Patient Details ?Name: Michael [name] ?MRN: [id number] ?DOB: 12/18/1960 ?Today's Date: 07/16/2021 ? ? ?History of Present Illness 61 y/o man with recent h/o discitis/osteomyelitis T8-9 with initial admission 3/17-3/22/23. Second admission 4/12-07/02/21 for progressive discititis and osteomyelitis. He underwent aspiration of fluid with bone bx T8-9 06/13/21. Adm 07/09/21 for worsening pain. CT refealed progressive osteomyelitis/discititis at T8-9 compared to recent studies with bony erosion and ?abscesses. Neurosurgery consult recommending surgery with pt refusing ? ?  ?PT Comments  ? ? Patient continues to self-limit his activity due to pain and yet refuses to take pain medication due to fear of constipation. He did sit at EOB x 5 minutes (the longest he has done thus far) with multiple distractions to keep him pre-occupied. Continues to refuse to attempt standing. Was able to demonstrate pulling 1750 ml on IS and able to state correct frequency for IS. Will need resume order for therapy/activity s/p surgery 5/18. ?   ?Recommendations for follow up therapy are one component of a multi-disciplinary discharge planning process, led by the attending physician.  Recommendations may be updated based on patient status, additional functional criteria and insurance authorization. ? ?Follow Up Recommendations ? Skilled nursing-short term rehab (<3 hours/day) ?  ?  ?Assistance Recommended at Discharge Intermittent Supervision/Assistance  ?Patient can return home with the following A lot of help with walking and/or transfers;Assistance with cooking/housework;Assist for transportation;Help with stairs or ramp for entrance ?  ?Equipment Recommendations ? None recommended by PT  ?  ?Recommendations for Other Services   ? ? ?  ?Precautions / Restrictions Precautions ?Precautions: Fall;Other (comment) ?Precaution Comments: ?back precautions for comfort ?Restrictions ?Weight Bearing Restrictions: No ?Other  Position/Activity Restrictions: self limiting due to back pain  ?  ? ?Mobility ? Bed Mobility ?Overal bed mobility: Needs Assistance ?Bed Mobility: Rolling, Sidelying to Sit, Sit to Sidelying ?Rolling: Min assist (with rail) ?Sidelying to sit: Min assist ?  ?  ?Sit to sidelying: Min guard ?General bed mobility comments: requires max encouragement to come to sit at EOB; min assist to initiate movements and then pt takes over;  sat EOB 5 minutes, returned to sidelying requesting help with legs but able to lift them onto the bed without assist ?  ? ?Transfers ?  ?  ?  ?  ?  ?  ?  ?  ?  ?General transfer comment: patient refused to attempt ?  ? ?Ambulation/Gait ?  ?  ?  ?  ?  ?  ?  ?General Gait Details: pt refuses ? ? ?Stairs ?  ?  ?  ?  ?  ? ? ?Wheelchair Mobility ?  ? ?Modified Rankin (Stroke Patients Only) ?  ? ? ?  ?Balance Overall balance assessment: Needs assistance ?Sitting-balance support: Feet supported, Single extremity supported ?Sitting balance-Leahy Scale: Poor ?Sitting balance - Comments: states he feels he cannot sit without UE support ?  ?  ?  ?  ?  ?  ?  ?  ?  ?  ?  ?  ?  ?  ?  ?  ? ?  ?Cognition Arousal/Alertness: Awake/alert ?Behavior During Therapy: Flat affect ?Overall Cognitive Status: No family/caregiver present to determine baseline cognitive functioning ?  ?  ?  ?  ?  ?  ?  ?  ?  ?  ?  ?  ?  ?  ?  ?  ?General Comments: pt doesn't want to take pain medicine because of risk of constipation, yet refuses to stand or  walk to assist with gut motility. States he understands the need to mobilize but cannot "wrap his head around it" due to pain ?  ?  ? ?  ?Exercises   ? ?  ?General Comments   ?  ?  ? ?Pertinent Vitals/Pain Pain Assessment ?Pain Assessment: Faces ?Faces Pain Scale: Hurts even more ?Pain Location: back ?Pain Descriptors / Indicators: Discomfort ?Pain Intervention(s): Limited activity within patient's tolerance, Patient requesting pain meds-RN notified  ? ? ?Home Living   ?  ?  ?  ?  ?  ?   ?  ?  ?  ?   ?  ?Prior Function    ?  ?  ?   ? ?PT Goals (current goals can now be found in the care plan section) Acute Rehab PT Goals ?Patient Stated Goal: feel better ?Time For Goal Achievement: 07/24/21 ?Potential to Achieve Goals: Fair ?Progress towards PT goals: Not progressing toward goals - comment (pt self-limits due to pain and refuses pain medication) ? ?  ?Frequency ? ? ? Min 3X/week ? ? ? ?  ?PT Plan Current plan remains appropriate  ? ? ?Co-evaluation PT/OT/SLP Co-Evaluation/Treatment: Yes ?Reason for Co-Treatment: Necessary to address cognition/behavior during functional activity (pt very self-limiting) ?  ?  ?  ? ?  ?AM-PAC PT "6 Clicks" Mobility   ?Outcome Measure ? Help needed turning from your back to your side while in a flat bed without using bedrails?: A Little ?Help needed moving from lying on your back to sitting on the side of a flat bed without using bedrails?: A Little ?Help needed moving to and from a bed to a chair (including a wheelchair)?: Total ?Help needed standing up from a chair using your arms (e.g., wheelchair or bedside chair)?: Total ?Help needed to walk in hospital room?: Total ?Help needed climbing 3-5 steps with a railing? : Total ?6 Click Score: 10 ? ?  ?End of Session   ?Activity Tolerance: Patient limited by pain ?Patient left: in bed;with call bell/phone within reach ?Nurse Communication: Mobility status ?PT Visit Diagnosis: Pain;Difficulty in walking, not elsewhere classified (R26.2) ?Pain - part of body:  (back) ?  ? ? ?Time: 7989-2119 ?PT Time Calculation (min) (ACUTE ONLY): 22 min ? ?Charges:  $Therapeutic Activity: 8-22 mins          ?          ? ? ?Jerolyn Center, PT ?Acute Rehabilitation Services  ?Pager [phone] ?Office [phone] ? ? ? ?Scherrie November Eshan Trupiano ?07/16/2021, 9:50 AM ? ?

## 2021-07-16 NOTE — Progress Notes (Signed)
? ?[facility] Infectious Diseases Follow Up Note ? ?Patient Identification: ?Patient Name: Michael [name] MRN: [id number] Admit Date: 07/09/2021  7:16 PM ?Age: 61 y.o.Today's Date: 07/16/2021 ? ?Reason for Visit: discitis and osteomyelitis  ? ?Principal Problem: ?  Osteomyelitis of thoracic region [facility]) ?Active Problems: ?  Essential hypertension ?  Thoracic discitis ?  Poor dentition ?  Nausea and vomiting ? ?Antibiotics:  ?Vancomycin 5/10-5/11, Daptomycin 5/13-c ?Pip/tazo 5/10-5/11, ceftriaxone 5/13-c ?  ?Lines/Hardware: ? ?Interval Events: remains afebrile, Plan for OR noted on 5/18 ? ?Assessment ?#T8-T9 discitis and osteomyelitis with progressive bony erosion and spinal stenosis, cord compression ?#Paraspinal soft tissue thickening with multiple small paraspinous soft tissue abscesses ? ?-Neurosurgery following, plan for OR on 5/18 for posterior decompression for stenosis with instrumentation and fusion T5-T11 noted ?- Blood cx 5/11 nop growth in 5 days  ? ?# AKI - resolved  ?# h/o E coli bacteremia and thoracic discitis in 04/2021  ? ?Recommendations ?Continue daptomycin and ceftriaxone as is ?Monitor CPK ?Follow-up neurosurgery recommendations, please send surgical specimen for cultures and path  as appropriate ?Will follow intermittently this week.  Please call with questions ? ?Rest of the management as per the primary team. ?Thank you for the consult. Please page with pertinent questions or concerns. ? ?______________________________________________________________________ ?Subjective ?patient seen and examined at the bedside.  ?Working with physical therapy ?Tells me not to remind about surgery tomorrow ? ?Vitals ?BP (!) 143/97 (BP Location: Left Wrist)   Pulse 97   Temp 98.3 ?F (36.8 ?C) (Oral)   Resp 18   Ht 5\' 11"  (1.803 m)   Wt 125.9 kg   SpO2 97%   BMI 38.71 kg/m?  ?  ?  ?Physical Exam ?Constitutional:  sitting up in the bed and working with  PT ?   Comments:  ? ?Cardiovascular:  ?   Rate and Rhythm: Normal rate and regular rhythm.  ?   Heart sounds: ? ?Pulmonary:  ?   Effort: Pulmonary effort is normal on room air  ?   Comments:  ? ?Abdominal:  ?   Palpations: Abdomen is soft.  ?   Tenderness: non distended  ? ?Neurological:  ?   General: grossly non focal, awake, alert and oriented  ? ?Psychiatric:     ?   Mood and Affect: Mood normal.  ? ?Pertinent Microbiology ?Results for orders placed or performed during the hospital encounter of 07/09/21  ?MRSA Next Gen by PCR, Nasal     Status: None  ? Collection Time: 07/10/21  6:45 AM  ? Specimen: Nasal Mucosa; Nasal Swab  ?Result Value Ref Range Status  ? MRSA by PCR Next Gen NOT DETECTED NOT DETECTED Final  ?  Comment: (NOTE) ?The GeneXpert MRSA Assay (FDA approved for NASAL specimens only), ?is one component of a comprehensive MRSA colonization surveillance ?program. It is not intended to diagnose MRSA infection nor to guide ?or monitor treatment for MRSA infections. ?Test performance is not FDA approved in patients less than 2 years ?old. ?Performed at [facility], [address] [place], [place] ?[place] ?  ?Culture, blood (Routine X 2) w Reflex to ID Panel     Status: None  ? Collection Time: 07/10/21  2:59 PM  ? Specimen: BLOOD  ?Result Value Ref Range Status  ? Specimen Description BLOOD LEFT ANTECUBITAL  Final  ? Special Requests   Final  ?  BOTTLES DRAWN AEROBIC AND ANAEROBIC Blood Culture adequate volume  ? Culture   Final  ?  NO GROWTH 5 DAYS ?Performed at [facility], [address], [place], [place] [address] ?  ? Report Status 07/15/2021 FINAL  Final  ?Culture, blood (Routine X 2) w Reflex to ID Panel     Status: None  ? Collection Time: 07/10/21  3:00 PM  ? Specimen: BLOOD  ?Result Value Ref Range Status  ? Specimen Description BLOOD RIGHT ANTECUBITAL  Final  ? Special Requests   Final  ?  BOTTLES DRAWN AEROBIC AND ANAEROBIC Blood Culture adequate volume  ? Culture   Final  ?   NO GROWTH 5 DAYS ?Performed at [facility], [address] [place], [place] [address] ?  ? Report Status 07/15/2021 FINAL  Final  ? ? ?Pertinent Lab. ? ?  Latest Ref Rng & Units 07/16/2021  ?  1:18 AM 07/15/2021  ?  1:36 AM 07/14/2021  ?  1:37 AM  ?CBC  ?WBC 4.0 - 10.5 K/uL 10.9   10.0   10.4    ?Hemoglobin 13.0 - 17.0 g/dL 87.8   67.6   72.0    ?Hematocrit 39.0 - 52.0 % 42.2   40.2   39.4    ?Platelets 150 - 400 K/uL 294   293   317    ? ? ?  Latest Ref Rng & Units 07/16/2021  ?  1:18 AM 07/15/2021  ?  1:36 AM 07/14/2021  ?  1:37 AM  ?CMP  ?Glucose 70 - 99 mg/dL 947   096   283    ?BUN 6 - 20 mg/dL 6   8   10     ?Creatinine 0.61 - 1.24 mg/dL   6.62   9.47    ?Sodium 135 - 145 mmol/L 138   138   140    ?Potassium 3.5 - 5.1 mmol/L 3.6   3.5   3.3    ?Chloride 98 - 111 mmol/L 105   106   104    ?CO2 22 - 32 mmol/L 24   22   24     ?Calcium 8.9 - 10.3 mg/dL 9.3   9.1   9.5    ?Total Protein 6.5 - 8.1 g/dL 6.9   6.7   6.9    ?Total Bilirubin 0.3 - 1.2 mg/dL 0.2   0.6   0.7    ?Alkaline Phos 38 - 126 U/L 94   92   100    ?AST 15 - 41 U/L 30   33   26    ?ALT 0 - 44 U/L 44   43   37    ? ? ? ?Pertinent Imaging today ?Plain films and CT images have been personally visualized and interpreted; radiology reports have been reviewed. Decision making incorporated into the Impression / Recommendations. ? ?No results found. ? ? ?I spent approx 52  minutes for this patient encounter including review of prior medical records, coordination of care with primary/other specialist with greater than 50% of time being face to face/counseling and discussing diagnostics/treatment plan with the patient/family. ? ?Electronically signed by:  ? ?6.54, MD ?Infectious Disease Physician ?[facility] ?Pager: [phone] ? ?

## 2021-07-16 NOTE — Progress Notes (Signed)
?                                  PROGRESS NOTE                                             ?                                                                                                                     ?                                         ? ? Patient Demographics:  ? ? Michael [name], is a 61 y.o. male, DOB - September 10, 1960, TKP:[id number] ? ?Outpatient Primary MD for the patient is [name], [name]    LOS - 7  Admit date - 07/09/2021   ? ?CC - back pain.    ? ?Brief Narrative (HPI from H&P)  61 y/o man with recent h/o discitis/osteomyelitis T8-9 with initial admission 3/17-3/22/23 when he was discharged on medical management with oral abx. Second admission 4/12-07/02/21 for progressive discititis and osteomyelitis. He underwent aspiration of fluid with bone bx T8-9 06/13/21. He was seen by neurosurgery and ID. He was discharged on medical management completing Rocephin 06/28/30 and switched to cefadroxil, since then he continued to have back discomfort, multiple ER visits, last ER visit at [facility] on 07/09/2021 CT at [facility] suggestive of progressive osteomyelitis T8-T9 with bone erosion.  He also had questionable paravertebral phlegmon with possible abscess, he has been placed on IV antibiotics.  Neurosurgeon Dr. Conchita Paris was consulted over the phone who requested to be transferred to [facility] ? ? Subjective:  ? ?No significant events overnight as discussed with staff, patient continues to have back pain, no bladder, no bowel incontinence.  No fever, no chills.   ? ? ? Assessment  & Plan :  ? ? ?Osteomyelitis of thoracic region  ?-  Persistent and worsening osteomyelitis T8-9 now with phlegmon and question of abscesses, septic facet joints after two hospitalizations and multiple rounds of IV and oral abx.  MRI of the spine noted with worsening fluid collection suspicious for worsening discitis and abscess.  Noncompliant with recommendations and medications for  the last several months.   ?-Antibiotics management per ID.   ?-Neurosurgery input greatly appreciated, he initially refused neurosurgical intervention but now agrees to it, does not want to use TLSO brace, he also refused taking long-acting narcotic and does not want to take Neurontin anymore.  Per neurosurgery surgical intervention scheduled for 07/17/2021, he is going back and forth between not agreeing for surgery, have discussed this with wife as well.  She said she has tried her  maximum 2 and is not much she can do.  Postop neuro surgery wants to take the patient to ICU for 48 hours.  Kindly inform ICU team Thursday morning. ? ? ?Chronic nausea and vomiting - Intermittent recurring problem. GI saw patient during April admission. Patient declined EGD, IV PPI and supportive care.  He is also very noncompliant with medications, repeatedly refusing multiple medications even saline spray for intermittent nosebleed, previous MD counseled him and his wife.  They understand that if he continues to refuse medications, saline spray, PT, OT, surgery, TLSO brace that it is nothing much that we can provide in eventually he might require comfort treatment only.  He has refused to be placed on depression medications as well of note he was seen by psychiatry last admission for similar issues, also seen by psych this admission. ? ? ?Suicidal ideation night of 07/13/2021.   ?- seen by psych on 07/14/2021. ? ?Poor dentition - Long standing problem. No visible infections.  ? ?Patient with oral candidiasis - Nystatin swish and spit ? ?Essential hypertension - blood pressure soft continue on as needed hydralazine only for now. ? ?Obesity .  BMI of 38.  Follow with PCP for weight loss. ?  ?Hypokalemia and hypomagnesemia.  Replaced.  He refuses to take oral supplementations. ? ?AKI - IVF, Renal US , switch Vanco to Dapto. Ur lytes.  AKI has resolved. ? ?GERD.  On PPI. ? ?   ? ?Condition - Extremely Guarded ? ?Family Communication  :   ? ?called wife [name] [phone] on 07/11/2021 at 11:52 AM and message left, called 07/12/21, updated 07/14/2021 ? ?Code Status :  Full ? ?Consults  :  NS, ID, IR ? ?PUD Prophylaxis : PPI ? ? Procedures  :    ? ?MRI T Spine - Discitis and osteomyelitis at T8-9. Progressive bony erosion since the prior MRI of 06/12/2021 but no change from the CT of 07/08/2021. Progressive spinal stenosis with moderate spinal stenosis, cord compression, and cord hyperintensity which has developed in the interval. Increased fluid in the disc space compatible with infection. Paraspinous soft tissue thickening and multiple small paraspinous soft tissue abscesses similar to the prior study  ? ?   ? ?Disposition Plan  :   ? ?Status is: Inpatient ? ?DVT Prophylaxis  :   ? ? ?  ? ?Lab Results  ?Component Value Date  ? PLT 294 07/16/2021  ? ? ?Diet :  ?Diet Order   ? ?       ?  Diet NPO time specified Except for: Other (See Comments)  Diet effective midnight       ?  ?  Diet Heart Room service appropriate? Yes; Fluid consistency: Thin  Diet effective now       ?  ? ?  ?  ? ?  ?  ? ?Inpatient Medications ? ?Scheduled Meds: ? docusate  100 mg Oral BID  ? feeding supplement  237 mL Oral BID BM  ? nystatin  5 mL Mouth/Throat QID  ? pantoprazole (PROTONIX) IV  40 mg Intravenous Daily  ? polyethylene glycol  17 g Oral Daily  ? ?Continuous Infusions: ? cefTRIAXone (ROCEPHIN)  IV 2 g (07/15/21 1445)  ? DAPTOmycin (CUBICIN)  IV 750 mg (07/15/21 2213)  ? ?PRN Meds:.acetaminophen **OR** acetaminophen, bisacodyl, hydrALAZINE, LORazepam, mineral oil-hydrophilic petrolatum, morphine injection, ondansetron (ZOFRAN) IV, traZODone ? ?Time Spent in minutes  30 ? ? ?Huey Bienenstock M.D on 07/16/2021 at 2:13 PM ? ?To page go to  [email or web address]  ? ?[facility] -  Office  [phone] ? ?See all Orders from today for further details ? ? ? Objective:  ? ?Vitals:  ? 07/16/21 0600 07/16/21 0700 07/16/21 0801 07/16/21 1143  ?BP:   (!) 143/97 126/90  ?Pulse: 96 93  97 91  ?Resp:   18 18  ?Temp:   98.3 ?F (36.8 ?C) (!) 97.3 ?F (36.3 ?C)  ?TempSrc:   Oral Oral  ?SpO2: 95% 92% 97%   ?Weight:      ?Height:      ? ? ?Wt Readings from Last 3 Encounters:  ?07/09/21 125.9 kg  ?07/02/21 125.9 kg  ?05/29/21 (!) 149.2 kg  ? ? ? ?Intake/Output Summary (Last 24 hours) at 07/16/2021 1413 ?Last data filed at 07/16/2021 0020 ?Gross per 24 hour  ?Intake --  ?Output 601 ml  ?Net -601 ml  ? ? ? ?Physical Exam ? ?Awake Alert, Oriented X 3, No new F.N deficits, flat affect ?Symmetrical Chest wall movement, Good air movement bilaterally, CTAB ?RRR,No Gallops,Rubs or new Murmurs, No Parasternal Heave ?+ve B.Sounds, Abd Soft, No tenderness, No rebound - guarding or rigidity. ?No Cyanosis, Clubbing or edema, No new Rash or bruise   ? ? ? ? ? ?RN pressure injury documentation: ?Pressure Injury 06/22/21 Coccyx Mid Stage 2 -  Partial thickness loss of dermis presenting as a shallow open injury with a red, pink wound bed without slough. broken skin blanching (Active)  ?06/22/21 1730  ?Location: Coccyx  ?Location Orientation: Mid  ?Staging: Stage 2 -  Partial thickness loss of dermis presenting as a shallow open injury with a red, pink wound bed without slough.  ?Wound Description (Comments): broken skin blanching  ?Present on Admission: No  ?Dressing Type Foam - Lift dressing to assess site every shift 07/15/21 2000  ? ? ? Data Review:  ? ? ?CBC ?Recent Labs  ?Lab 07/12/21 ?0146 07/13/21 ?0110 07/14/21 ?0137 07/15/21 ?0136 07/16/21 ?0118  ?WBC 11.4* 10.8* 10.4 10.0 10.9*  ?HGB 12.7* 12.7* 12.7* 13.1 14.1  ?HCT 38.6* 39.4 39.4 40.2 42.2  ?PLT 351 314 317 293 294  ?MCV 90.6 91.4 91.4 91.2 91.5  ?MCH 29.8 29.5 29.5 29.7 30.6  ?MCHC 32.9 32.2 32.2 32.6 33.4  ?RDW 16.6* 16.4* 16.7* 16.9* 17.4*  ?LYMPHSABS 2.1 1.7 1.5 2.2 2.4  ?MONOABS 0.9 0.9 0.9 1.1* 1.2*  ?EOSABS 0.2 0.2 0.2 0.2 0.3  ?BASOSABS 0.1 0.1 0.1 0.2* 0.1  ? ? ?Electrolytes ?Recent Labs  ?Lab 07/10/21 ?0103 07/10/21 ?[id number] 07/10/21 ?[id number] 07/11/21 ?[id number]  07/12/21 ?0146 07/13/21 ?0110 07/14/21 ?[id number] 07/15/21 ?0136 07/16/21 ?0118  ?NA 136  --   --  137 138 137 140 138 138  ?K 3.2*  --   --  3.4* 3.1* 3.1* 3.3* 3.5 3.6  ?CL 104  --   --  102 106 105 104 10

## 2021-07-17 ENCOUNTER — Encounter (HOSPITAL_COMMUNITY): Payer: Self-pay | Admitting: Internal Medicine

## 2021-07-17 ENCOUNTER — Inpatient Hospital Stay (HOSPITAL_COMMUNITY): Payer: BC Managed Care – PPO | Admitting: Certified Registered Nurse Anesthetist

## 2021-07-17 ENCOUNTER — Inpatient Hospital Stay (HOSPITAL_COMMUNITY): Payer: BC Managed Care – PPO

## 2021-07-17 ENCOUNTER — Inpatient Hospital Stay (HOSPITAL_COMMUNITY)
Admission: AD | Disposition: A | Discharge status: Skilled Nursing Facility | Payer: Self-pay | Source: Other Acute Inpatient Hospital | Attending: Internal Medicine

## 2021-07-17 DIAGNOSIS — E861 Hypovolemia: Secondary | ICD-10-CM

## 2021-07-17 DIAGNOSIS — I9589 Other hypotension: Secondary | ICD-10-CM | POA: Diagnosis not present

## 2021-07-17 DIAGNOSIS — D62 Acute posthemorrhagic anemia: Secondary | ICD-10-CM

## 2021-07-17 DIAGNOSIS — M4624 Osteomyelitis of vertebra, thoracic region: Secondary | ICD-10-CM | POA: Diagnosis not present

## 2021-07-17 DIAGNOSIS — R112 Nausea with vomiting, unspecified: Secondary | ICD-10-CM

## 2021-07-17 LAB — POCT I-STAT 7, (LYTES, BLD GAS, ICA,H+H)
Acid-Base Excess: 0 mmol/L (ref 0.0–2.0)
Acid-base deficit: 1 mmol/L (ref 0.0–2.0)
Acid-base deficit: 2 mmol/L (ref 0.0–2.0)
Acid-base deficit: 1 mmol/L (ref 0.0–2.0)
Acid-base deficit: 2 mmol/L (ref 0.0–2.0)
Bicarbonate: 24.4 mmol/L (ref 20.0–28.0)
Bicarbonate: 24.1 mmol/L (ref 20.0–28.0)
Bicarbonate: 22.5 mmol/L (ref 20.0–28.0)
Bicarbonate: 24.1 mmol/L (ref 20.0–28.0)
Bicarbonate: 23.6 mmol/L (ref 20.0–28.0)
Calcium, Ion: 1.21 mmol/L (ref 1.15–1.40)
Calcium, Ion: 1.19 mmol/L (ref 1.15–1.40)
Calcium, Ion: 1.21 mmol/L (ref 1.15–1.40)
Calcium, Ion: 1.15 mmol/L (ref 1.15–1.40)
Calcium, Ion: 1.23 mmol/L (ref 1.15–1.40)
HCT: 37 % — ABNORMAL LOW (ref 39.0–52.0)
HCT: 38 % — ABNORMAL LOW (ref 39.0–52.0)
HCT: 32 % — ABNORMAL LOW (ref 39.0–52.0)
HCT: 29 % — ABNORMAL LOW (ref 39.0–52.0)
HCT: 33 % — ABNORMAL LOW (ref 39.0–52.0)
Hemoglobin: 12.6 g/dL — ABNORMAL LOW (ref 13.0–17.0)
Hemoglobin: 12.9 g/dL — ABNORMAL LOW (ref 13.0–17.0)
Hemoglobin: 10.9 g/dL — ABNORMAL LOW (ref 13.0–17.0)
Hemoglobin: 9.9 g/dL — ABNORMAL LOW (ref 13.0–17.0)
Hemoglobin: 11.2 g/dL — ABNORMAL LOW (ref 13.0–17.0)
O2 Saturation: 100 %
O2 Saturation: 100 %
O2 Saturation: 100 %
O2 Saturation: 100 %
O2 Saturation: 99 %
Potassium: 3.8 mmol/L (ref 3.5–5.1)
Potassium: 4.2 mmol/L (ref 3.5–5.1)
Potassium: 4 mmol/L (ref 3.5–5.1)
Potassium: 4.6 mmol/L (ref 3.5–5.1)
Potassium: 4.5 mmol/L (ref 3.5–5.1)
Sodium: 136 mmol/L (ref 135–145)
Sodium: 137 mmol/L (ref 135–145)
Sodium: 137 mmol/L (ref 135–145)
Sodium: 137 mmol/L (ref 135–145)
Sodium: 137 mmol/L (ref 135–145)
TCO2: 26 mmol/L (ref 22–32)
TCO2: 25 mmol/L (ref 22–32)
TCO2: 24 mmol/L (ref 22–32)
TCO2: 25 mmol/L (ref 22–32)
TCO2: 25 mmol/L (ref 22–32)
pCO2 arterial: 42.2 mmHg (ref 32–48)
pCO2 arterial: 38.5 mmHg (ref 32–48)
pCO2 arterial: 37 mmHg (ref 32–48)
pCO2 arterial: 39.2 mmHg (ref 32–48)
pCO2 arterial: 41.3 mmHg (ref 32–48)
pH, Arterial: 7.37 (ref 7.35–7.45)
pH, Arterial: 7.405 (ref 7.35–7.45)
pH, Arterial: 7.391 (ref 7.35–7.45)
pH, Arterial: 7.396 (ref 7.35–7.45)
pH, Arterial: 7.364 (ref 7.35–7.45)
pO2, Arterial: 227 mmHg — ABNORMAL HIGH (ref 83–108)
pO2, Arterial: 215 mmHg — ABNORMAL HIGH (ref 83–108)
pO2, Arterial: 221 mmHg — ABNORMAL HIGH (ref 83–108)
pO2, Arterial: 227 mmHg — ABNORMAL HIGH (ref 83–108)
pO2, Arterial: 137 mmHg — ABNORMAL HIGH (ref 83–108)

## 2021-07-17 LAB — CBC
HCT: 29.3 % — ABNORMAL LOW (ref 39.0–52.0)
Hemoglobin: 9.5 g/dL — ABNORMAL LOW (ref 13.0–17.0)
MCH: 30 pg (ref 26.0–34.0)
MCHC: 32.4 g/dL (ref 30.0–36.0)
MCV: 92.4 fL (ref 80.0–100.0)
Platelets: 160 K/uL (ref 150–400)
RBC: 3.17 MIL/uL — ABNORMAL LOW (ref 4.22–5.81)
RDW: 16.9 % — ABNORMAL HIGH (ref 11.5–15.5)
WBC: 17.7 K/uL — ABNORMAL HIGH (ref 4.0–10.5)
nRBC: 0 % (ref 0.0–0.2)

## 2021-07-17 LAB — COMPREHENSIVE METABOLIC PANEL
ALT: 46 U/L — ABNORMAL HIGH (ref 0–44)
AST: 31 U/L (ref 15–41)
Albumin: 3.1 g/dL — ABNORMAL LOW (ref 3.5–5.0)
Alkaline Phosphatase: 92 U/L (ref 38–126)
Anion gap: 10 (ref 5–15)
BUN: 6 mg/dL (ref 6–20)
CO2: 20 mmol/L — ABNORMAL LOW (ref 22–32)
Calcium: 9.2 mg/dL (ref 8.9–10.3)
Chloride: 106 mmol/L (ref 98–111)
Creatinine, Ser: 0.99 mg/dL (ref 0.61–1.24)
GFR, Estimated: >60 mL/min (ref >60)
Glucose, Bld: 124 mg/dL — ABNORMAL HIGH (ref 70–99)
Potassium: 3.5 mmol/L (ref 3.5–5.1)
Sodium: 136 mmol/L (ref 135–145)
Total Bilirubin: 0.5 mg/dL (ref 0.3–1.2)
Total Protein: 6.8 g/dL (ref 6.5–8.1)

## 2021-07-17 LAB — GLUCOSE, CAPILLARY
Glucose-Capillary: 157 mg/dL — ABNORMAL HIGH (ref 70–99)
Glucose-Capillary: 115 mg/dL — ABNORMAL HIGH (ref 70–99)

## 2021-07-17 LAB — PROTIME-INR
INR: 1.3 — ABNORMAL HIGH (ref 0.8–1.2)
Prothrombin Time: 16.4 seconds — ABNORMAL HIGH (ref 11.4–15.2)

## 2021-07-17 LAB — BASIC METABOLIC PANEL
Anion gap: 9 (ref 5–15)
BUN: 8 mg/dL (ref 6–20)
CO2: 19 mmol/L — ABNORMAL LOW (ref 22–32)
Calcium: 8.5 mg/dL — ABNORMAL LOW (ref 8.9–10.3)
Chloride: 109 mmol/L (ref 98–111)
Creatinine, Ser: 1.02 mg/dL (ref 0.61–1.24)
GFR, Estimated: >60 mL/min (ref >60)
Glucose, Bld: 159 mg/dL — ABNORMAL HIGH (ref 70–99)
Potassium: 3.8 mmol/L (ref 3.5–5.1)
Sodium: 137 mmol/L (ref 135–145)

## 2021-07-17 LAB — PREPARE RBC (CROSSMATCH)

## 2021-07-17 LAB — CBC WITH DIFFERENTIAL/PLATELET
Abs Immature Granulocytes: 0.45 10*3/uL — ABNORMAL HIGH (ref 0.00–0.07)
Basophils Absolute: 0.2 10*3/uL — ABNORMAL HIGH (ref 0.0–0.1)
Basophils Relative: 1 %
Eosinophils Absolute: 0.3 10*3/uL (ref 0.0–0.5)
Eosinophils Relative: 3 %
HCT: 42.3 % (ref 39.0–52.0)
Hemoglobin: 14.2 g/dL (ref 13.0–17.0)
Immature Granulocytes: 4 %
Lymphocytes Relative: 21 %
Lymphs Abs: 2.4 10*3/uL (ref 0.7–4.0)
MCH: 30.5 pg (ref 26.0–34.0)
MCHC: 33.6 g/dL (ref 30.0–36.0)
MCV: 90.8 fL (ref 80.0–100.0)
Monocytes Absolute: 1.1 10*3/uL — ABNORMAL HIGH (ref 0.1–1.0)
Monocytes Relative: 10 %
Neutro Abs: 6.8 10*3/uL (ref 1.7–7.7)
Neutrophils Relative %: 61 %
Platelets: 284 10*3/uL (ref 150–400)
RBC: 4.66 MIL/uL (ref 4.22–5.81)
RDW: 17.5 % — ABNORMAL HIGH (ref 11.5–15.5)
WBC: 11.3 10*3/uL — ABNORMAL HIGH (ref 4.0–10.5)
nRBC: 0 % (ref 0.0–0.2)

## 2021-07-17 LAB — MRSA NEXT GEN BY PCR, NASAL: MRSA by PCR Next Gen: NOT DETECTED

## 2021-07-17 IMAGING — RF DG THORACIC SPINE 2V
1 series · 2 of 2 positions shown · non-contrast
Comparison: Thoracic spine MRI [DATE]

CLINICAL DATA: T8 and T9 corpectomy, T5 through T11 fusion

EXAM:
THORACIC SPINE 2 VIEWS

[Series 1: run · 2 of 2 slices shown]
[im 1/2]
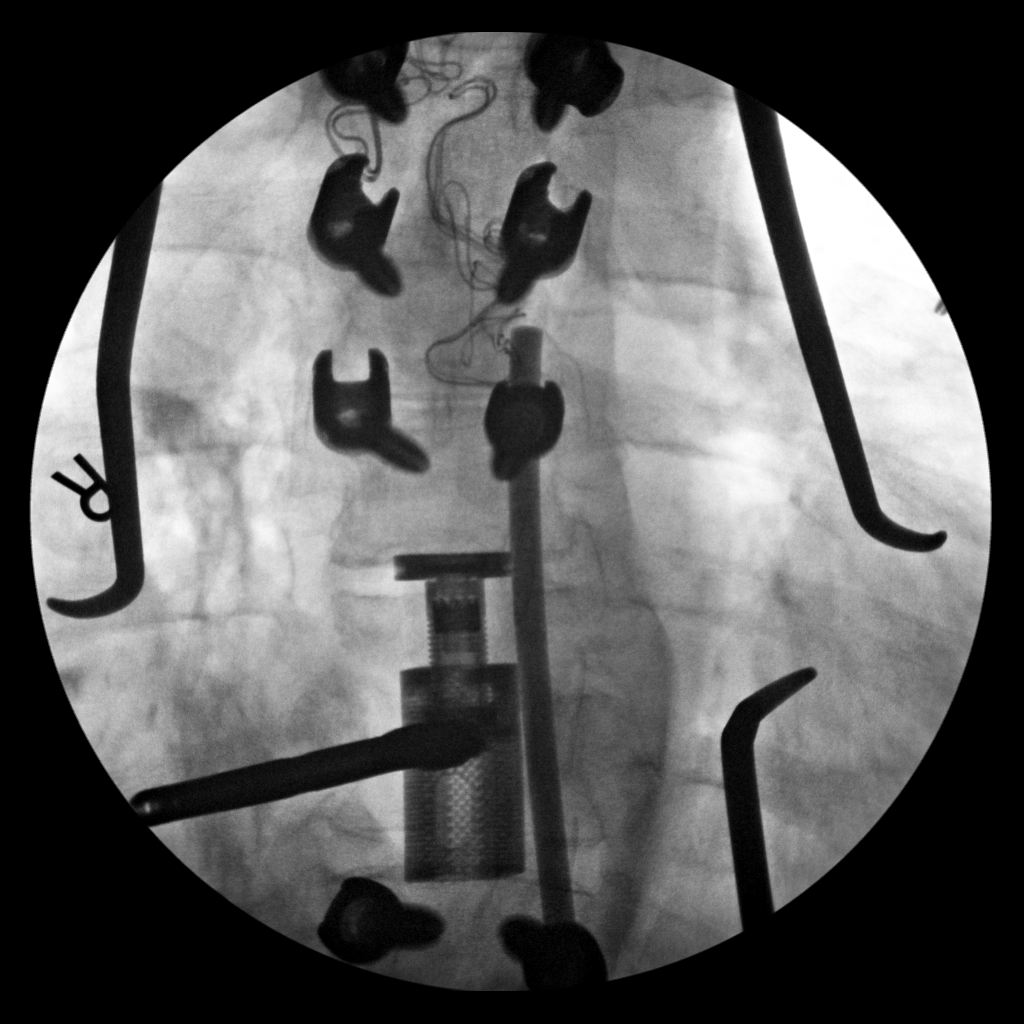
[im 2/2]
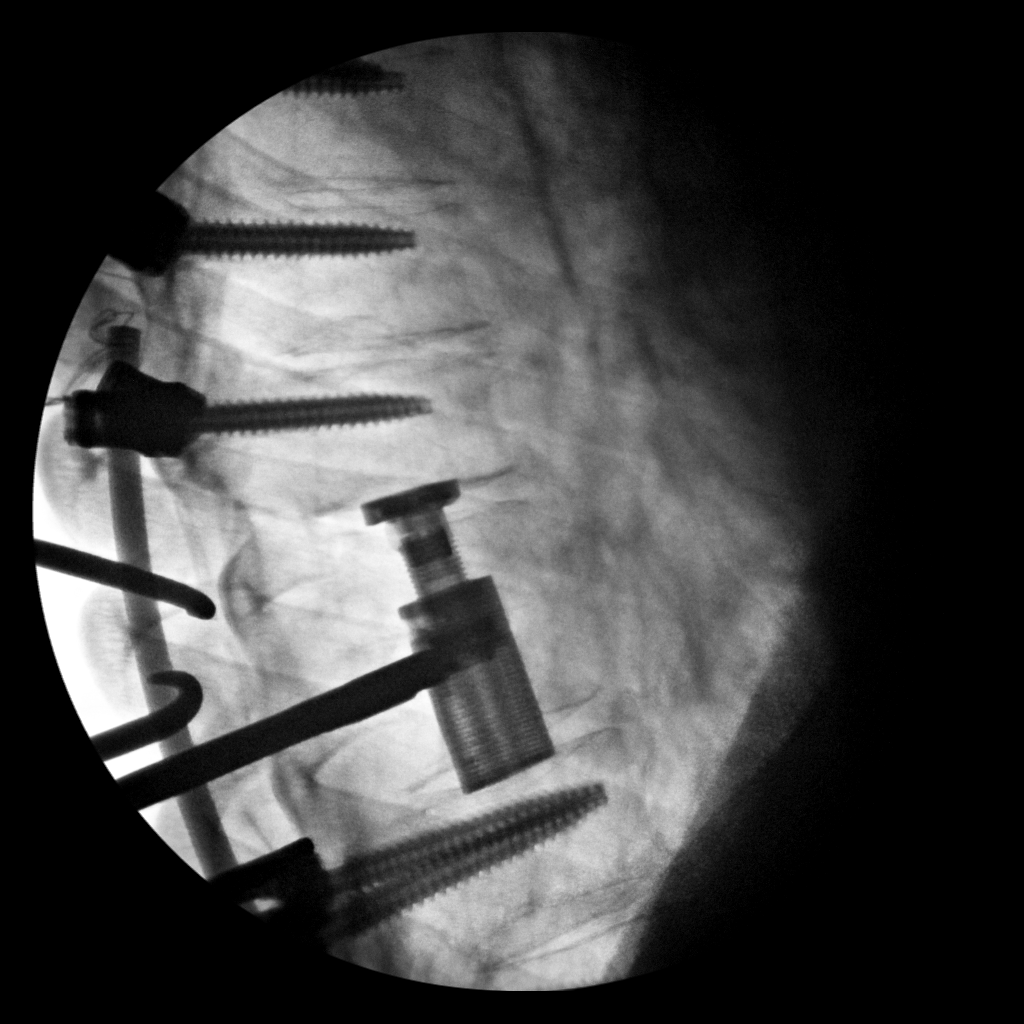

[2 of 2 positions shown; findings below may reference images not displayed]

FINDINGS: Two C-arm fluoroscopic images were obtained intraoperatively and
submitted for post operative interpretation. Surgical levels can not
be adequately assessed on the current study due to limited field of
view. Postsurgical changes reflecting bilateral pedicle screws and 2
level corpectomy are seen on the initial image. Subsequent images
demonstrate placement of interlocking rods. Fluoro time 31 seconds,
dose 11.09 mGy. Please see the performing provider's procedural
report for further detail.
IMPRESSION: Intraoperative images during thoracic spine corpectomy and posterior
instrumented fusion.

## 2021-07-17 SURGERY — POSTERIOR THORACIC FUSION 4 LEVELS
Anesthesia: General

## 2021-07-17 MED ORDER — HYDROMORPHONE HCL 1 MG/ML IJ SOLN
INTRAMUSCULAR | Status: DC | PRN
  Administered 2021-07-17: .5 mg via INTRAVENOUS

## 2021-07-17 MED ORDER — 0.9 % SODIUM CHLORIDE (POUR BTL) OPTIME
TOPICAL | Status: DC | PRN
  Administered 2021-07-17: 1000 mL

## 2021-07-17 MED ORDER — METHOCARBAMOL 500 MG PO TABS
500.0000 mg | ORAL_TABLET | Freq: Four times a day (QID) | ORAL | Status: DC | PRN
  Administered 2021-07-22 – 2021-07-27 (×8): 500 mg via ORAL
  Filled 2021-07-17 (×10): qty 1

## 2021-07-17 MED ORDER — DEXAMETHASONE SODIUM PHOSPHATE 10 MG/ML IJ SOLN
INTRAMUSCULAR | Status: AC
  Filled 2021-07-17: qty 1

## 2021-07-17 MED ORDER — HYDROMORPHONE HCL 1 MG/ML IJ SOLN
INTRAMUSCULAR | Status: AC
  Filled 2021-07-17: qty 0.5

## 2021-07-17 MED ORDER — SUGAMMADEX SODIUM 200 MG/2ML IV SOLN
INTRAVENOUS | Status: DC | PRN
  Administered 2021-07-17: 250 mg via INTRAVENOUS

## 2021-07-17 MED ORDER — GELATIN ABSORBABLE MT POWD
OROMUCOSAL | Status: DC | PRN
  Administered 2021-07-17 (×3): 5 mL via TOPICAL

## 2021-07-17 MED ORDER — KETAMINE HCL 50 MG/5ML IJ SOSY
PREFILLED_SYRINGE | INTRAMUSCULAR | Status: AC
  Filled 2021-07-17: qty 5

## 2021-07-17 MED ORDER — MIDAZOLAM HCL 2 MG/2ML IJ SOLN
INTRAMUSCULAR | Status: AC
  Filled 2021-07-17: qty 2

## 2021-07-17 MED ORDER — THROMBIN 20000 UNITS EX SOLR
CUTANEOUS | Status: DC | PRN
  Administered 2021-07-17: 20 mL via TOPICAL

## 2021-07-17 MED ORDER — SODIUM CHLORIDE 0.9% IV SOLUTION: Freq: Once | INTRAVENOUS | Status: DC

## 2021-07-17 MED ORDER — ACETAMINOPHEN 10 MG/ML IV SOLN
INTRAVENOUS | Status: DC | PRN
  Administered 2021-07-17: 1000 mg via INTRAVENOUS

## 2021-07-17 MED ORDER — LIDOCAINE 2% (20 MG/ML) 5 ML SYRINGE
INTRAMUSCULAR | Status: DC | PRN
  Administered 2021-07-17: 100 mg via INTRAVENOUS

## 2021-07-17 MED ORDER — HEPARIN SODIUM (PORCINE) 5000 UNIT/ML IJ SOLN
5000.0000 [IU] | Freq: Two times a day (BID) | INTRAMUSCULAR | Status: DC
  Administered 2021-07-18 – 2021-07-21 (×6): 5000 [IU] via SUBCUTANEOUS
  Filled 2021-07-17 (×6): qty 1

## 2021-07-17 MED ORDER — SODIUM CHLORIDE 0.9 % IV SOLN: INTRAVENOUS | Status: DC

## 2021-07-17 MED ORDER — METHOCARBAMOL 1000 MG/10ML IJ SOLN
500.0000 mg | Freq: Four times a day (QID) | INTRAVENOUS | Status: DC | PRN
  Administered 2021-07-18 – 2021-07-30 (×4): 500 mg via INTRAVENOUS
  Filled 2021-07-17 (×4): qty 5

## 2021-07-17 MED ORDER — CEFAZOLIN SODIUM-DEXTROSE 2-4 GM/100ML-% IV SOLN: 2.0000 g | Freq: Three times a day (TID) | INTRAVENOUS | Status: DC

## 2021-07-17 MED ORDER — PHENYLEPHRINE 80 MCG/ML (10ML) SYRINGE FOR IV PUSH (FOR BLOOD PRESSURE SUPPORT)
PREFILLED_SYRINGE | INTRAVENOUS | Status: DC | PRN
  Administered 2021-07-17 (×2): 80 ug via INTRAVENOUS
  Administered 2021-07-17: 160 ug via INTRAVENOUS
  Administered 2021-07-17 (×4): 80 ug via INTRAVENOUS
  Administered 2021-07-17: 160 ug via INTRAVENOUS
  Administered 2021-07-17 (×2): 40 ug via INTRAVENOUS
  Administered 2021-07-17 (×4): 80 ug via INTRAVENOUS
  Administered 2021-07-17: 160 ug via INTRAVENOUS
  Administered 2021-07-17: 80 ug via INTRAVENOUS

## 2021-07-17 MED ORDER — ALBUMIN HUMAN 5 % IV SOLN
12.5000 g | Freq: Once | INTRAVENOUS | Status: AC
  Administered 2021-07-17: 12.5 g via INTRAVENOUS

## 2021-07-17 MED ORDER — SODIUM CHLORIDE 0.9 % IV SOLN: INTRAVENOUS | Status: DC | PRN

## 2021-07-17 MED ORDER — BUPIVACAINE-EPINEPHRINE 0.5% -1:200000 IJ SOLN
INTRAMUSCULAR | Status: AC
  Filled 2021-07-17: qty 1

## 2021-07-17 MED ORDER — LACTATED RINGERS IV SOLN: INTRAVENOUS | Status: DC | PRN

## 2021-07-17 MED ORDER — LIDOCAINE-EPINEPHRINE 1 %-1:100000 IJ SOLN
INTRAMUSCULAR | Status: DC | PRN
  Administered 2021-07-17: 15 mL

## 2021-07-17 MED ORDER — HYDROMORPHONE HCL 1 MG/ML IJ SOLN: 0.2500 mg | INTRAMUSCULAR | Status: DC | PRN

## 2021-07-17 MED ORDER — OXYCODONE HCL 5 MG PO TABS
5.0000 mg | ORAL_TABLET | ORAL | Status: DC | PRN
  Administered 2021-07-20 – 2021-07-31 (×23): 5 mg via ORAL
  Filled 2021-07-17 (×27): qty 1

## 2021-07-17 MED ORDER — HYDROMORPHONE HCL 1 MG/ML IJ SOLN
0.5000 mg | INTRAMUSCULAR | Status: DC | PRN
  Administered 2021-07-17 – 2021-07-22 (×6): 0.5 mg via INTRAVENOUS
  Filled 2021-07-17: qty 1
  Filled 2021-07-17: qty 0.5
  Filled 2021-07-17 (×4): qty 1

## 2021-07-17 MED ORDER — CHLORHEXIDINE GLUCONATE CLOTH 2 % EX PADS
6.0000 | MEDICATED_PAD | Freq: Every day | CUTANEOUS | Status: DC
  Administered 2021-07-17 – 2021-07-31 (×14): 6 via TOPICAL

## 2021-07-17 MED ORDER — DEXAMETHASONE SODIUM PHOSPHATE 10 MG/ML IJ SOLN
INTRAMUSCULAR | Status: DC | PRN
  Administered 2021-07-17: 10 mg via INTRAVENOUS

## 2021-07-17 MED ORDER — DOUBLE ANTIBIOTIC 500-10000 UNIT/GM EX OINT
TOPICAL_OINTMENT | CUTANEOUS | Status: AC
  Filled 2021-07-17: qty 28.4

## 2021-07-17 MED ORDER — PHENOL 1.4 % MT LIQD: 1.0000 | OROMUCOSAL | Status: DC | PRN

## 2021-07-17 MED ORDER — FENTANYL CITRATE (PF) 250 MCG/5ML IJ SOLN
INTRAMUSCULAR | Status: DC | PRN
Start: 2021-07-17 — End: 2021-07-17
  Administered 2021-07-17 (×2): 50 ug via INTRAVENOUS
  Administered 2021-07-17 (×2): 25 ug via INTRAVENOUS
  Administered 2021-07-17: 50 ug via INTRAVENOUS
  Administered 2021-07-17: 100 ug via INTRAVENOUS
  Administered 2021-07-17 (×4): 50 ug via INTRAVENOUS

## 2021-07-17 MED ORDER — THROMBIN 20000 UNITS EX SOLR
CUTANEOUS | Status: AC
  Filled 2021-07-17: qty 20000

## 2021-07-17 MED ORDER — PHENYLEPHRINE HCL-NACL 20-0.9 MG/250ML-% IV SOLN
INTRAVENOUS | Status: DC | PRN
Start: 2021-07-17 — End: 2021-07-17
  Administered 2021-07-17: 50 ug/min via INTRAVENOUS

## 2021-07-17 MED ORDER — MEPERIDINE HCL 25 MG/ML IJ SOLN: 6.2500 mg | INTRAMUSCULAR | Status: DC | PRN

## 2021-07-17 MED ORDER — CEFAZOLIN SODIUM 1 G IJ SOLR
INTRAMUSCULAR | Status: AC
  Filled 2021-07-17: qty 30

## 2021-07-17 MED ORDER — FENTANYL CITRATE (PF) 250 MCG/5ML IJ SOLN
INTRAMUSCULAR | Status: AC
  Filled 2021-07-17: qty 5

## 2021-07-17 MED ORDER — KETOROLAC TROMETHAMINE 30 MG/ML IJ SOLN: 30.0000 mg | Freq: Once | INTRAMUSCULAR | Status: DC | PRN

## 2021-07-17 MED ORDER — PROPOFOL 10 MG/ML IV BOLUS
INTRAVENOUS | Status: AC
  Filled 2021-07-17: qty 20

## 2021-07-17 MED ORDER — INSULIN ASPART 100 UNIT/ML IJ SOLN
2.0000 [IU] | INTRAMUSCULAR | Status: DC
  Administered 2021-07-17: 4 [IU] via SUBCUTANEOUS
  Administered 2021-07-19 – 2021-07-21 (×6): 2 [IU] via SUBCUTANEOUS
  Administered 2021-07-22: 4 [IU] via SUBCUTANEOUS
  Administered 2021-07-23 – 2021-07-24 (×3): 2 [IU] via SUBCUTANEOUS
  Administered 2021-07-24: 4 [IU] via SUBCUTANEOUS
  Administered 2021-07-25: 2 [IU] via SUBCUTANEOUS
  Administered 2021-07-25: 4 [IU] via SUBCUTANEOUS
  Administered 2021-07-25 – 2021-07-31 (×14): 2 [IU] via SUBCUTANEOUS

## 2021-07-17 MED ORDER — ADULT MULTIVITAMIN W/MINERALS CH
1.0000 | ORAL_TABLET | Freq: Every day | ORAL | Status: DC
  Administered 2021-07-19 – 2021-07-29 (×5): 1 via ORAL
  Filled 2021-07-17 (×9): qty 1

## 2021-07-17 MED ORDER — ROCURONIUM BROMIDE 10 MG/ML (PF) SYRINGE
PREFILLED_SYRINGE | INTRAVENOUS | Status: DC | PRN
  Administered 2021-07-17: 100 mg via INTRAVENOUS
  Administered 2021-07-17 (×3): 50 mg via INTRAVENOUS

## 2021-07-17 MED ORDER — THROMBIN 5000 UNITS EX SOLR
CUTANEOUS | Status: AC
  Filled 2021-07-17: qty 5000

## 2021-07-17 MED ORDER — SODIUM CHLORIDE (PF) 0.9 % IJ SOLN
INTRAMUSCULAR | Status: AC
  Filled 2021-07-17: qty 20

## 2021-07-17 MED ORDER — PHENYLEPHRINE 80 MCG/ML (10ML) SYRINGE FOR IV PUSH (FOR BLOOD PRESSURE SUPPORT)
PREFILLED_SYRINGE | INTRAVENOUS | Status: AC
  Filled 2021-07-17: qty 20

## 2021-07-17 MED ORDER — ALBUMIN HUMAN 5 % IV SOLN
INTRAVENOUS | Status: AC
Start: 2021-07-17 — End: 2021-07-17
  Filled 2021-07-17: qty 500

## 2021-07-17 MED ORDER — SODIUM CHLORIDE 0.9% FLUSH
3.0000 mL | Freq: Two times a day (BID) | INTRAVENOUS | Status: DC
  Administered 2021-07-17 – 2021-07-31 (×21): 3 mL via INTRAVENOUS

## 2021-07-17 MED ORDER — PROCHLORPERAZINE EDISYLATE 10 MG/2ML IJ SOLN
10.0000 mg | Freq: Four times a day (QID) | INTRAMUSCULAR | Status: DC | PRN
  Filled 2021-07-17: qty 2

## 2021-07-17 MED ORDER — SUCCINYLCHOLINE CHLORIDE 200 MG/10ML IV SOSY
PREFILLED_SYRINGE | INTRAVENOUS | Status: DC | PRN
  Administered 2021-07-17: 140 mg via INTRAVENOUS

## 2021-07-17 MED ORDER — OXYCODONE HCL 5 MG PO TABS
10.0000 mg | ORAL_TABLET | ORAL | Status: DC | PRN
  Administered 2021-07-18 – 2021-07-23 (×7): 10 mg via ORAL
  Filled 2021-07-17 (×7): qty 2

## 2021-07-17 MED ORDER — MENTHOL 3 MG MT LOZG: 1.0000 | LOZENGE | OROMUCOSAL | Status: DC | PRN

## 2021-07-17 MED ORDER — LIDOCAINE-EPINEPHRINE 1 %-1:100000 IJ SOLN
INTRAMUSCULAR | Status: AC
  Filled 2021-07-17: qty 1

## 2021-07-17 MED ORDER — ALBUMIN HUMAN 5 % IV SOLN: INTRAVENOUS | Status: DC | PRN

## 2021-07-17 MED ORDER — BUPIVACAINE-EPINEPHRINE 0.5% -1:200000 IJ SOLN
INTRAMUSCULAR | Status: DC | PRN
Start: 2021-07-17 — End: 2021-07-17
  Administered 2021-07-17: 15 mL

## 2021-07-17 MED ORDER — SODIUM CHLORIDE 0.9% FLUSH
3.0000 mL | INTRAVENOUS | Status: DC | PRN
Start: 2021-07-17 — End: 2021-07-31

## 2021-07-17 MED ORDER — ACETAMINOPHEN 10 MG/ML IV SOLN
INTRAVENOUS | Status: AC
  Filled 2021-07-17: qty 100

## 2021-07-17 MED ORDER — LIDOCAINE 2% (20 MG/ML) 5 ML SYRINGE
INTRAMUSCULAR | Status: AC
  Filled 2021-07-17: qty 5

## 2021-07-17 MED ORDER — BUPIVACAINE LIPOSOME 1.3 % IJ SUSP
INTRAMUSCULAR | Status: DC | PRN
  Administered 2021-07-17: 20 mL

## 2021-07-17 MED ORDER — ROCURONIUM BROMIDE 10 MG/ML (PF) SYRINGE
PREFILLED_SYRINGE | INTRAVENOUS | Status: AC
  Filled 2021-07-17: qty 30

## 2021-07-17 MED ORDER — MIDAZOLAM HCL 5 MG/5ML IJ SOLN
INTRAMUSCULAR | Status: DC | PRN
  Administered 2021-07-17: 2 mg via INTRAVENOUS

## 2021-07-17 MED ORDER — LACTATED RINGERS IV BOLUS
1000.0000 mL | Freq: Once | INTRAVENOUS | Status: AC
  Administered 2021-07-17: 1000 mL via INTRAVENOUS

## 2021-07-17 MED ORDER — SODIUM CHLORIDE 0.9 % IV SOLN
2.0000 g | INTRAVENOUS | Status: DC
  Administered 2021-07-17 – 2021-07-30 (×14): 2 g via INTRAVENOUS
  Filled 2021-07-17 (×16): qty 20

## 2021-07-17 MED ORDER — SODIUM CHLORIDE 0.9 % IV SOLN: 250.0000 mL | INTRAVENOUS | Status: DC

## 2021-07-17 MED ORDER — PROPOFOL 10 MG/ML IV BOLUS
INTRAVENOUS | Status: DC | PRN
  Administered 2021-07-17: 200 mg via INTRAVENOUS

## 2021-07-17 MED ORDER — ONDANSETRON HCL 4 MG/2ML IJ SOLN
INTRAMUSCULAR | Status: AC
  Filled 2021-07-17: qty 2

## 2021-07-17 MED ORDER — PROPOFOL 10 MG/ML IV BOLUS
INTRAVENOUS | Status: AC
Start: 2021-07-17 — End: ?
  Filled 2021-07-17: qty 20

## 2021-07-17 MED ORDER — ONDANSETRON HCL 4 MG/2ML IJ SOLN: 4.0000 mg | Freq: Once | INTRAMUSCULAR | Status: DC | PRN

## 2021-07-17 MED ORDER — ONDANSETRON HCL 4 MG/2ML IJ SOLN
INTRAMUSCULAR | Status: DC | PRN
  Administered 2021-07-17: 4 mg via INTRAVENOUS

## 2021-07-17 MED ORDER — BUPIVACAINE LIPOSOME 1.3 % IJ SUSP
INTRAMUSCULAR | Status: AC
  Filled 2021-07-17: qty 20

## 2021-07-17 MED ORDER — KETAMINE HCL 10 MG/ML IJ SOLN
INTRAMUSCULAR | Status: DC | PRN
Start: 2021-07-17 — End: 2021-07-17
  Administered 2021-07-17: 40 mg via INTRAVENOUS
  Administered 2021-07-17: 10 mg via INTRAVENOUS

## 2021-07-17 SURGICAL SUPPLY — 96 items
BAG BANDED W/RUBBER/TAPE 36X54 (MISCELLANEOUS) ×2 IMPLANT
BAG COUNTER SPONGE SURGICOUNT (BAG) ×3 IMPLANT
BAND RUBBER #18 3X1/16 STRL (MISCELLANEOUS) ×2 IMPLANT
BASKET BONE COLLECTION (BASKET) ×1 IMPLANT
BLADE BONE MILL MEDIUM (MISCELLANEOUS) ×1 IMPLANT
BUR CARBIDE MATCH 3.0 (BURR) ×3 IMPLANT
CAGE CORP 14X18X38-54 5D (Cage) ×1 IMPLANT
CARTRIDGE OIL MAESTRO DRILL (MISCELLANEOUS) ×1 IMPLANT
CNTNR URN SCR LID CUP LEK RST (MISCELLANEOUS) ×1 IMPLANT
CONT SPEC 4OZ STRL OR WHT (MISCELLANEOUS) ×1
COVER BACK TABLE 60X90IN (DRAPES) ×2 IMPLANT
COVER MAYO STAND STRL (DRAPES) ×1 IMPLANT
DECANTER SPIKE VIAL GLASS SM (MISCELLANEOUS) ×1 IMPLANT
DERMABOND ADVANCED (GAUZE/BANDAGES/DRESSINGS) ×1
DERMABOND ADVANCED .7 DNX12 (GAUZE/BANDAGES/DRESSINGS) ×1 IMPLANT
DIFFUSER DRILL AIR PNEUMATIC (MISCELLANEOUS) ×1 IMPLANT
DRAIN JACKSON RD 7FR 3/32 (WOUND CARE) IMPLANT
DRAPE C-ARM 42X72 X-RAY (DRAPES) ×1 IMPLANT
DRAPE C-ARMOR (DRAPES) ×1 IMPLANT
DRAPE LAPAROTOMY 100X72X124 (DRAPES) ×2 IMPLANT
DRAPE MICROSCOPE LEICA (MISCELLANEOUS) ×1 IMPLANT
DRAPE SURG 17X23 STRL (DRAPES) ×2 IMPLANT
DRSG OPSITE 4X5.5 SM (GAUZE/BANDAGES/DRESSINGS) ×1 IMPLANT
DRSG OPSITE POSTOP 3X4 (GAUZE/BANDAGES/DRESSINGS) ×1 IMPLANT
DRSG OPSITE POSTOP 4X10 (GAUZE/BANDAGES/DRESSINGS) ×1 IMPLANT
DRSG OPSITE POSTOP 4X6 (GAUZE/BANDAGES/DRESSINGS) ×1 IMPLANT
DRSG PAD ABDOMINAL 8X10 ST (GAUZE/BANDAGES/DRESSINGS) ×1 IMPLANT
DRSG TELFA 3X8 NADH (GAUZE/BANDAGES/DRESSINGS) ×2 IMPLANT
DURAPREP 26ML APPLICATOR (WOUND CARE) ×2 IMPLANT
ELECT BLADE INSULATED 4IN (ELECTROSURGICAL) ×2
ELECT COATED BLADE 2.86 ST (ELECTRODE) ×1 IMPLANT
ELECT REM PT RETURN 9FT ADLT (ELECTROSURGICAL) ×4
ELECTRODE BLADE INSULATED 4IN (ELECTROSURGICAL) ×1 IMPLANT
ELECTRODE REM PT RTRN 9FT ADLT (ELECTROSURGICAL) ×1 IMPLANT
GAUZE 4X4 16PLY ~~LOC~~+RFID DBL (SPONGE) ×1 IMPLANT
GAUZE SPONGE 4X4 12PLY STRL (GAUZE/BANDAGES/DRESSINGS) ×1 IMPLANT
GLOVE BIOGEL PI IND STRL 8 (GLOVE) ×2 IMPLANT
GLOVE BIOGEL PI INDICATOR 8 (GLOVE) ×2
GLOVE ECLIPSE 6.5 STRL STRAW (GLOVE) ×2 IMPLANT
GLOVE ECLIPSE 8.0 STRL XLNG CF (GLOVE) ×4 IMPLANT
GLOVE SURG ENC MOIS LTX SZ8 (GLOVE) ×3 IMPLANT
GLOVE SURG UNDER POLY LF SZ8.5 (GLOVE) ×3 IMPLANT
GOWN STRL REUS W/ TWL LRG LVL3 (GOWN DISPOSABLE) IMPLANT
GOWN STRL REUS W/ TWL XL LVL3 (GOWN DISPOSABLE) ×2 IMPLANT
GOWN STRL REUS W/TWL 2XL LVL3 (GOWN DISPOSABLE) IMPLANT
GOWN STRL REUS W/TWL LRG LVL3 (GOWN DISPOSABLE) ×2
GOWN STRL REUS W/TWL XL LVL3 (GOWN DISPOSABLE) ×3
GUIDEWIRE EVEREST 1.4X620 2-PK (WIRE) ×5 IMPLANT
HEMOSTAT POWDER KIT SURGIFOAM (HEMOSTASIS) ×3 IMPLANT
KIT BASIN OR (CUSTOM PROCEDURE TRAY) ×2 IMPLANT
KIT POSITION SURG JACKSON T1 (MISCELLANEOUS) ×2 IMPLANT
KIT TURNOVER KIT B (KITS) ×2 IMPLANT
MARKER SKIN DUAL TIP RULER LAB (MISCELLANEOUS) ×2 IMPLANT
MILL BONE PREP (MISCELLANEOUS) ×1 IMPLANT
NDL BIOPSY DD SERENGETI 8G (NEEDLE) IMPLANT
NDL HYPO 21X1.5 ECLIPSE (NEEDLE) ×1 IMPLANT
NDL HYPO 21X1.5 SAFETY (NEEDLE) IMPLANT
NDL HYPO 25X1 1.5 SAFETY (NEEDLE) ×1 IMPLANT
NDL SPNL 18GX3.5 QUINCKE PK (NEEDLE) IMPLANT
NEEDLE BIOPSY DD SERENGETI 8G (NEEDLE) ×4 IMPLANT
NEEDLE HYPO 21X1.5 ECLIPSE (NEEDLE) ×2 IMPLANT
NEEDLE HYPO 21X1.5 SAFETY (NEEDLE) ×2 IMPLANT
NEEDLE HYPO 25X1 1.5 SAFETY (NEEDLE) ×2 IMPLANT
NEEDLE SPNL 18GX3.5 QUINCKE PK (NEEDLE) ×4 IMPLANT
NS IRRIG 1000ML POUR BTL (IV SOLUTION) ×2 IMPLANT
OIL CARTRIDGE MAESTRO DRILL (MISCELLANEOUS)
PACK LAMINECTOMY NEURO (CUSTOM PROCEDURE TRAY) ×2 IMPLANT
PAD ARMBOARD 7.5X6 YLW CONV (MISCELLANEOUS) ×6 IMPLANT
PAD DRESSING TELFA 3X8 NADH (GAUZE/BANDAGES/DRESSINGS) IMPLANT
PATTIES SURGICAL .5 X.5 (GAUZE/BANDAGES/DRESSINGS) IMPLANT
PATTIES SURGICAL .5 X1 (DISPOSABLE) IMPLANT
PATTIES SURGICAL 1X1 (DISPOSABLE) IMPLANT
PENCIL BUTTON HOLSTER BLD 10FT (ELECTRODE) ×1 IMPLANT
ROD 220MM (Rod) ×2 IMPLANT
SCREW CANN PA EVEREST 6.5X40 (Screw) ×6 IMPLANT
SCREW CANN PA EVEREST 7.5X50 (Screw) ×4 IMPLANT
SET SCREW (Screw) ×10 IMPLANT
SET SCREW VRST (Screw) IMPLANT
SPONGE SURGIFOAM ABS GEL 100 (HEMOSTASIS) ×2 IMPLANT
SPONGE T-LAP 4X18 ~~LOC~~+RFID (SPONGE) IMPLANT
STAPLER VISISTAT 35W (STAPLE) ×2 IMPLANT
SUT PROLENE 3 0 SH DA (SUTURE) ×1 IMPLANT
SUT SILK 2 0 TIES 10X30 (SUTURE) ×1 IMPLANT
SUT VIC AB 0 CT1 18XCR BRD8 (SUTURE) IMPLANT
SUT VIC AB 0 CT1 27 (SUTURE) ×1
SUT VIC AB 0 CT1 27XBRD ANBCTR (SUTURE) ×1 IMPLANT
SUT VIC AB 0 CT1 8-18 (SUTURE) ×3
SUT VIC AB 2-0 CP2 18 (SUTURE) ×4 IMPLANT
SWAB CULTURE ESWAB REG 1ML (MISCELLANEOUS) ×1 IMPLANT
SWAB CULTURE LIQ STUART DBL (MISCELLANEOUS) ×1 IMPLANT
SYR 20ML LL LF (SYRINGE) ×2 IMPLANT
TOWEL GREEN STERILE (TOWEL DISPOSABLE) ×1 IMPLANT
TOWEL GREEN STERILE FF (TOWEL DISPOSABLE) ×1 IMPLANT
TRAY FOLEY MTR SLVR 16FR STAT (SET/KITS/TRAYS/PACK) ×2 IMPLANT
TUBE CONNECTING 20X1/4 (TUBING) ×1 IMPLANT
WATER STERILE IRR 1000ML POUR (IV SOLUTION) ×2 IMPLANT

## 2021-07-17 NOTE — Progress Notes (Signed)
Transported to surgery via bed and report called to [employer] CRNA

## 2021-07-17 NOTE — Progress Notes (Signed)
   Providing Compassionate, Quality Care - Together  NEUROSURGERY PROGRESS NOTE   S: No issues overnight.   O: EXAM:  BP 124/79 (BP Location: Right Wrist)   Pulse 94   Temp 97.7 F (36.5 C) (Oral)   Resp 16   Ht 5\' 11"  (1.803 m)   Wt 125.9 kg   SpO2 97%   BMI 38.71 kg/m   Awake, alert, oriented  PERRL Speech fluent, appropriate  CNs grossly intact  SILT 5/5 BUE 4+/5 BLE   ASSESSMENT:  61 y.o. 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  We discussed all risks, benefits and expected outcomes.  Answered all of his questions.  Informed consent was obtained.    Thank you for allowing me to participate in this patient's care.  Please do not hesitate to call with questions or concerns.   48, DO Neurosurgeon [facility] Cell: [phone]

## 2021-07-17 NOTE — Progress Notes (Signed)
PROGRESS NOTE                                                                                                                                                                                                             Patient Demographics:    Michael [name], is a 61 y.o. male, DOB - 1961/02/14, HYH:[id number]  Outpatient Primary MD for the patient is [name], [name]    LOS - 8  Admit date - 07/09/2021    CC - back pain.     Brief Narrative (HPI from H&P)   60 y/o man with recent h/o discitis/osteomyelitis T8-9 with initial admission 3/17-3/22/23 when he was discharged on medical management with oral abx. Second admission 4/12-07/02/21 for progressive discititis and osteomyelitis. He underwent aspiration of fluid with bone bx T8-9 06/13/21. He was seen by neurosurgery and ID. He was discharged on medical management completing Rocephin 06/28/30 and switched to cefadroxil, since then he continued to have back discomfort, multiple ER visits, last ER visit at [facility] on 07/09/2021 CT [facility] suggestive of progressive osteomyelitis T8-T9 with bone erosion.  He also had questionable paravertebral phlegmon with possible abscess, he has been placed on IV antibiotics.  Neurosurgeon Dr. Conchita Paris was consulted over the phone who requested to be transferred to [facility].   Subjective:   No significant events overnight, he stayed n.p.o., reports he is anxious about surgery, otherwise denies any other complaints.  .   Assessment  & Plan :    Osteomyelitis of thoracic region  -  Persistent and worsening osteomyelitis T8-9 now with phlegmon and question of abscesses, septic facet joints after two hospitalizations and multiple rounds of IV and oral abx.  MRI of the spine noted with worsening fluid collection suspicious for worsening discitis and abscess.  Noncompliant with recommendations and medications for the last several  months.   -Antibiotics management per ID.  Initially on vancomycin and Zosyn, currently transition to daptomycin and Rocephin. -Neurosurgery input greatly appreciated, he initially refused neurosurgical intervention but now agrees to it, plan for surgery today, neurosurgery request admission to ICU for 48 hours after after surgery  its anticipated to be prolonged surgery and  he will need close monitoring .   Chronic nausea and vomiting  - Intermittent recurring problem. GI saw patient during April admission. Patient declined EGD,  IV PPI and supportive care.  He is also very noncompliant with medications, repeatedly refusing multiple medications even saline spray for intermittent nosebleed, previous MD counseled him and his wife.  They understand that if he continues to refuse medications, saline spray, PT, OT, surgery, TLSO brace that it is nothing much that we can provide in eventually he might require comfort treatment only.  He has refused to be placed on depression medications as well of note he was seen by psychiatry last admission for similar issues, also seen by psych this admission.   Suicidal ideation  - night of 07/13/2021.  He was seen by psych, does not appear to be a danger to himself or others, discontinued one-to-one suicide sitter.  Psych has signed off.  Poor dentition - Long standing problem. No visible infections.   Patient with oral candidiasis - Nystatin swish and spit  Essential hypertension - blood pressure soft continue on as needed hydralazine only for now.  Obesity .  BMI of 38.  Follow with PCP for weight loss.   Hypokalemia and hypomagnesemia.  Replaced.  He refuses to take oral supplementations.  AKI - IVF, Renal US , switch Vanco to Dapto. Ur lytes.  AKI has resolved.  GERD.  On PPI.      Condition - Extremely Guarded  Family Communication  :    with wife at bedside.  Code Status :  Full  Consults  :  NS, ID, IR  PUD Prophylaxis : PPI   Procedures   :     MRI T Spine - Discitis and osteomyelitis at T8-9. Progressive bony erosion since the prior MRI of 06/12/2021 but no change from the CT of 07/08/2021. Progressive spinal stenosis with moderate spinal stenosis, cord compression, and cord hyperintensity which has developed in the interval. Increased fluid in the disc space compatible with infection. Paraspinous soft tissue thickening and multiple small paraspinous soft tissue abscesses similar to the prior study       Disposition Plan  :    Status is: Inpatient  DVT Prophylaxis  :    SCD's Start: 07/16/21 2235    Lab Results  Component Value Date   PLT 284 07/17/2021    Diet :  Diet Order             Diet NPO time specified  Diet effective midnight                    Inpatient Medications  Scheduled Meds:  docusate  100 mg Oral BID   feeding supplement  237 mL Oral BID BM   nystatin  5 mL Mouth/Throat QID   pantoprazole (PROTONIX) IV  40 mg Intravenous Daily   polyethylene glycol  17 g Oral Daily   Continuous Infusions:   ceFAZolin (ANCEF) IV     cefTRIAXone (ROCEPHIN)  IV 2 g (07/16/21 1445)   DAPTOmycin (CUBICIN)  IV 750 mg (07/16/21 2216)   PRN Meds:.acetaminophen **OR** acetaminophen, bisacodyl, hydrALAZINE, LORazepam, mineral oil-hydrophilic petrolatum, morphine injection, ondansetron (ZOFRAN) IV, traZODone    Huey Bienenstock M.D on 07/17/2021 at 6:35 AM  To page go to [email or web address]   [facility] -  Office  [phone]  See all Orders from today for further details    Objective:   Vitals:   07/16/21 1623 07/16/21 2012 07/17/21 0009 07/17/21 0350  BP: (!) 142/95 (!) 133/96 116/87 124/79  Pulse: (!) 110 (!) 105 (!) 101 94  Resp: 18 17 17 16   Temp: 97.7 F (  36.5 C) 97.9 F (36.6 C) 97.9 F (36.6 C) 97.7 F (36.5 C)  TempSrc: Oral Oral Oral Oral  SpO2:  97% 98% 97%  Weight:      Height:        Wt Readings from Last 3 Encounters:  07/09/21 125.9 kg  07/02/21 125.9 kg   05/29/21 (!) 149.2 kg    No intake or output data in the 24 hours ending 07/17/21 [id number]    Physical Exam  Awake Alert, Oriented X 3, No new F.N deficits, anxiuos Symmetrical Chest wall movement, Good air movement bilaterally, CTAB RRR,No Gallops,Rubs or new Murmurs, No Parasternal Heave +ve B.Sounds, Abd Soft, No tenderness, No rebound - guarding or rigidity. No Cyanosis, Clubbing or edema, No new Rash or bruise         RN pressure injury documentation:      Data Review:    CBC Recent Labs  Lab 07/13/21 0110 07/14/21 0137 07/15/21 0136 07/16/21 0118 07/17/21 0158  WBC 10.8* 10.4 10.0 10.9* 11.3*  HGB 12.7* 12.7* 13.1 14.1 14.2  HCT 39.4 39.4 40.2 42.2 42.3  PLT 314 317 293 294 284  MCV 91.4 91.4 91.2 91.5 90.8  MCH 29.5 29.5 29.7 30.6 30.5  MCHC 32.2 32.2 32.6 33.4 33.6  RDW 16.4* 16.7* 16.9* 17.4* 17.5*  LYMPHSABS 1.7 1.5 2.2 2.4 2.4  MONOABS 0.9 0.9 1.1* 1.2* 1.1*  EOSABS 0.2 0.2 0.2 0.3 0.3  BASOSABS 0.1 0.1 0.2* 0.1 0.2*    Electrolytes Recent Labs  Lab 07/11/21 0247 07/12/21 0146 07/13/21 0110 07/14/21 0137 07/15/21 0136 07/16/21 0118 07/17/21 0158  NA 137 138 137 140 138 138 136  K 3.4* 3.1* 3.1* 3.3* 3.5 3.6 3.5  CL 102 106 105 104 106 105 106  CO2 25 19* 21* 24 22 24  20*  GLUCOSE 116* 91 122* 119* 114* 102* 124*  BUN 8 10 10 10 8 6 6   CREATININE 1.51* 1.20 1.08 0.96 0.94 0.94 0.99  CALCIUM 9.1 9.0 9.2 9.5 9.1 9.3 9.2  AST 22 19 25 26  33 30 31  ALT 40 30 35 37 43 44 46*  ALKPHOS 96 87 92 100 92 94 92  BILITOT 1.0 0.7 0.8 0.7 0.6 0.2* 0.5  ALBUMIN 2.9* 2.9* 2.9* 3.0* 3.0* 3.1* 3.1*  MG 2.2 2.1 2.0 2.0  --   --   --   CRP 1.7* 1.6* 1.5* 1.3*  --   --   --   PROCALCITON <0.10 <0.10 <0.10 <0.10 <0.10  --   --   INR  --   --   --   --   --  1.1  --   BNP 15.0 17.3 21.2 21.6  --   --   --     ------------------------------------------------------------------------------------------------------------------ No results for input(s): CHOL,  HDL, LDLCALC, TRIG, CHOLHDL, LDLDIRECT in the last 72 hours.  Lab Results  Component Value Date   HGBA1C 6.1 (H) 07/10/2021    Radiology Reports  No results found.

## 2021-07-17 NOTE — Anesthesia Postprocedure Evaluation (Signed)
Anesthesia Post Note  Patient: MARKS SCALERA  Procedure(s) Performed: THORACIC  FIVE-ELEVEN POSTERIOR INSTRUMENTATION AND FUSION, THORACIC EIGHT, THORACIC NINE CORPECTOMY VIA COSTROTRANSVERSECTOMY     Patient location during evaluation: PACU Anesthesia Type: General Level of consciousness: awake and alert Pain management: pain level controlled Vital Signs Assessment: post-procedure vital signs reviewed and stable Respiratory status: spontaneous breathing, nonlabored ventilation, respiratory function stable and patient connected to nasal cannula oxygen Cardiovascular status: blood pressure returned to baseline and stable Postop Assessment: no apparent nausea or vomiting Anesthetic complications: no   No notable events documented.               Shelton Silvas

## 2021-07-17 NOTE — Anesthesia Procedure Notes (Signed)
Arterial Line Insertion Start/End5/18/2023 8:00 AM, 07/17/2021 8:03 AM Performed by: Demetrio Lapping, CRNA, CRNA  Preanesthetic checklist: patient identified, IV checked, site marked, risks and benefits discussed, surgical consent, monitors and equipment checked, pre-op evaluation, timeout performed and anesthesia consent Left, radial was placed Catheter size: 20 G Hand hygiene performed , maximum sterile barriers used  and Seldinger technique used Allen's test indicative of satisfactory collateral circulation Attempts: 1 Procedure performed without using ultrasound guided technique. Following insertion, dressing applied and Biopatch. Patient tolerated the procedure well with no immediate complications.

## 2021-07-17 NOTE — Consult Note (Signed)
NAME:  Michael [name], MRN:  [id number], DOB:  1961/01/03, LOS: 8 ADMISSION DATE:  07/09/2021, CONSULTATION DATE:  07/17/21 REFERRING MD:  Elgergawy - TRH, CHIEF COMPLAINT:  osteo thoracic spine    History of Present Illness:   History obtained from chart review   61 yo M PMH T8-9 discitis/osteomyelitis with associated e coli bacteremia requiring course of rocephin, cefadroxyl but has had ongoing pain since. Ultimately presented to [facility] for pain 5/10 and obtained a CT again showinng T8-T9 osteomyelitis, discitis -- now with evidence of bony erosion and progression since prior. Transferred to [facility] for NSGY evaluation, admitted to [facility]. Started on vanc, zosyn. ID consulted as well as NSGY -- abx changed to dapto rocephin. Seen by psych for passive SI.   Ultimately 5/18 he went for T5-T11 fusion, T8-T9 corpectomy 5/18 with NSGY and is planned for ICU stay post op.   PCCM consulted in this setting   Pertinent  Medical History  Thoracic osteomyelitis HTN Medication noncompliance  Ecoli bacteremia   Significant Hospital Events: Including procedures, antibiotic start and stop dates in addition to other pertinent events   5/10 ED to ED transfer from [facility] for ongoing back pain in setting of known T8-T9 osteo. Concern for progression 5/12 NSGY and ID consults. Abx changed to dapto + rocephin.  5/15 psych consult for passive SI 5/17 while he had been declining operative intervention, when further counseled on poor prognosis, then agreed to OR intervention 5/18 OR for T5-T11 fusion, T8-T9 corpectomy. Plan for ICU stay post op. PCCM consult. Had 1200 ebl, 250 cellsaver, 1 unit PRBCs. On low dose neo post-op. Moving LEs to command   Interim History / Subjective:  61 year old male patient now status post Surgery  Objective   Blood pressure 124/79, pulse 94, temperature 97.7 F (36.5 C), temperature source Oral, resp. rate 16, height 5\' 11"  (1.803 m), weight 125.9 kg, SpO2 97 %.         Intake/Output Summary (Last 24 hours) at 07/17/2021 1024 Last data filed at 07/17/2021 1004 Gross per 24 hour  Intake 1900 ml  Output 75 ml  Net 1825 ml   Filed Weights   07/09/21 2200  Weight: 125.9 kg    Examination: General: 61 year old white male currently slightly sedated following OR HENT: Normocephalic atraumatic mucous membranes dry Lungs: Clear diminished bases no accessory use Cardiovascular: Regular rate slightly tachycardic no murmur rub or gallop Thoracic: Postoperative wound VAC draining bloody output Abdomen: Soft not tender Extremities: Warm dry Neuro: Awake currently nonverbal moving all extremities following commands GU: Clear yellow  Resolved Hospital Problem list   AKI Suicidal ideation   Assessment & Plan:  Principal Problem:   Osteomyelitis of thoracic region [facility]) Active Problems:   Essential hypertension   Thoracic discitis   Poor dentition   Nausea and vomiting   T8-T9 osteomyelitis, with kyphosis and stenosis, now status post T8, T9 corpectomy, bilateral pedicle screw placement of T5, T6, T8 7, T10. Plan post op per NSGY cont abx as directed by Infectious disease currently on daptomycin and rocephin   Postoperative blood loss Plan Serial blood counts ordered  Oral candidiasis Plan Cont nystatin   Intermittent fluid and electrolyte imbalance Plan Monitor & replace as indicated   GERD, Chronic nausea and vomiting See note from IM 5/18 has refused EGD Plan PPI PRN antiemetics  Obesity (BMI 38) Plan  Poor dentition Plan F/u outpt   Medical non-compliance w/ h/o suicidal ideation. Seen and cleared by psych Plan Cont  to offer support May need to re-engage psych if becomes a issue again   Best Practice (right click and "Reselect all SmartList Selections" daily)   Diet/type: NPO w/ oral meds DVT prophylaxis: SCD GI prophylaxis: PPI Lines: N/A Foley:  Yes, and it is still needed Code Status:  full code Last date of  multidisciplinary goals of care discussion [pending]  Labs   CBC: Recent Labs  Lab 07/13/21 0110 07/14/21 0137 07/15/21 0136 07/16/21 0118 07/17/21 0158  WBC 10.8* 10.4 10.0 10.9* 11.3*  NEUTROABS 7.4 7.2 6.0 6.4 6.8  HGB 12.7* 12.7* 13.1 14.1 14.2  HCT 39.4 39.4 40.2 42.2 42.3  MCV 91.4 91.4 91.2 91.5 90.8  PLT 314 317 293 294 284    Basic Metabolic Panel: Recent Labs  Lab 07/11/21 0247 07/12/21 0146 07/13/21 0110 07/14/21 0137 07/15/21 0136 07/16/21 0118 07/17/21 0158  NA 137 138 137 140 138 138 136  K 3.4* 3.1* 3.1* 3.3* 3.5 3.6 3.5  CL 102 106 105 104 106 105 106  CO2 25 19* 21* 24 22 24  20*  GLUCOSE 116* 91 122* 119* 114* 102* 124*  BUN 8 10 10 10 8 6 6   CREATININE 1.51* 1.20 1.08 0.96 0.94 0.94 0.99  CALCIUM 9.1 9.0 9.2 9.5 9.1 9.3 9.2  MG 2.2 2.1 2.0 2.0  --   --   --    GFR: Estimated Creatinine Clearance: 108.5 mL/min (by C-G formula based on SCr of 0.99 mg/dL). Recent Labs  Lab 07/12/21 0146 07/13/21 0110 07/14/21 0137 07/15/21 0136 07/16/21 0118 07/17/21 0158  PROCALCITON <0.10 <0.10 <0.10 <0.10  --   --   WBC 11.4* 10.8* 10.4 10.0 10.9* 11.3*    Liver Function Tests: Recent Labs  Lab 07/13/21 0110 07/14/21 0137 07/15/21 0136 07/16/21 0118 07/17/21 0158  AST 25 26 33 30 31  ALT 35 37 43 44 46*  ALKPHOS 92 100 92 94 92  BILITOT 0.8 0.7 0.6 0.2* 0.5  PROT 7.0 6.9 6.7 6.9 6.8  ALBUMIN 2.9* 3.0* 3.0* 3.1* 3.1*   No results for input(s): LIPASE, AMYLASE in the last 168 hours. No results for input(s): AMMONIA in the last 168 hours.  ABG No results found for: PHART, PCO2ART, PO2ART, HCO3, TCO2, ACIDBASEDEF, O2SAT   Coagulation Profile: Recent Labs  Lab 07/16/21 0118  INR 1.1    Cardiac Enzymes: Recent Labs  Lab 07/11/21 0247  CKTOTAL 11*    HbA1C: Hgb A1c MFr Bld  Date/Time Value Ref Range Status  07/10/2021 01:03 AM 6.1 (H) 4.8 - 5.6 % Final    Comment:    (NOTE) Pre diabetes:          5.7%-6.4%  Diabetes:               >6.4%  Glycemic control for   <7.0% adults with diabetes     CBG: No results for input(s): GLUCAP in the last 168 hours.  Review of Systems:   Not able  Past Medical History:  He,  has a past medical history of Class 1 obesity due to excess calories with body mass index (BMI) of 34.0 to 34.9 in adult, Diskitis, and Hypertension.   Surgical History:   Past Surgical History:  Procedure Laterality Date   IR CERVICAL/THORACIC DISC ASPIRATION W/IMAG GUIDE  05/18/2021   IR FLUORO GUIDED NEEDLE PLC ASPIRATION/INJECTION LOC  06/13/2021     Social History:   reports that he has never smoked. He has never used smokeless tobacco. He reports that he  does not currently use alcohol. He reports that he does not use drugs.   Family History:  His family history is not on file.   Allergies No Known Allergies   Home Medications  Prior to Admission medications   Medication Sig Start Date End Date Taking? Authorizing Provider  acetaminophen (TYLENOL) 325 MG tablet Take 2 tablets (650 mg total) by mouth every 6 (six) hours as needed for mild pain, fever or headache (or Fever >/= 101). 07/02/21  Yes Rolly SalterPatel, Pranav M, MD  bisacodyl (DULCOLAX) 10 MG suppository Place 1 suppository (10 mg total) rectally as needed for moderate constipation. 07/02/21  Yes Rolly SalterPatel, Pranav M, MD  docusate sodium (COLACE) 100 MG capsule Take 1 capsule (100 mg total) by mouth 2 (two) times daily. 07/02/21 07/02/22 Yes Rolly SalterPatel, Pranav M, MD  feeding supplement (ENSURE ENLIVE / ENSURE PLUS) LIQD Take 237 mLs by mouth 2 (two) times daily between meals. 07/02/21  Yes Rolly SalterPatel, Pranav M, MD  gabapentin (NEURONTIN) 300 MG capsule Take 1 capsule (300 mg total) by mouth 3 (three) times daily. 07/02/21  Yes Rolly SalterPatel, Pranav M, MD  lisinopril-hydrochlorothiazide (ZESTORETIC) 10-12.5 MG tablet Take 0.5 tablets by mouth daily.   Yes [provider]  pantoprazole (PROTONIX) 40 MG tablet Take 1 tablet (40 mg total) by mouth daily for 14 days. 07/02/21  07/16/21 Yes Rolly SalterPatel, Pranav M, MD  polyethylene glycol (MIRALAX / GLYCOLAX) 17 g packet Take 17 g by mouth daily. 07/02/21  Yes Rolly SalterPatel, Pranav M, MD  traMADol (ULTRAM) 50 MG tablet Take 1 tablet (50 mg total) by mouth every 12 (twelve) hours as needed for moderate pain or severe pain. 07/02/21 07/02/22 Yes Rolly SalterPatel, Pranav M, MD     Critical care time: 32 min    Simonne MartinetPeter E Myrel Rappleye ACNP-BC [facility] Pager # [phone] OR # [phone] if no answer

## 2021-07-17 NOTE — Transfer of Care (Signed)
Immediate Anesthesia Transfer of Care Note  Patient: Michael [name]  Procedure(s) Performed: THORACIC  FIVE-ELEVEN POSTERIOR INSTRUMENTATION AND FUSION, THORACIC EIGHT, THORACIC NINE CORPECTOMY VIA COSTROTRANSVERSECTOMY  Patient Location: PACU  Anesthesia Type:General  Level of Consciousness: drowsy  Airway & Oxygen Therapy: Patient Spontanous Breathing and Patient connected to face mask oxygen  Post-op Assessment: Report given to RN and Post -op Vital signs reviewed and stable  Post vital signs: Reviewed and stable  Last Vitals:  Vitals Value Taken Time  BP 105/64 07/17/21 1555  Temp    Pulse 97 07/17/21 1600  Resp 14 07/17/21 1600  SpO2 100 % 07/17/21 1600  Vitals shown include unvalidated device data.  Last Pain:  Vitals:   07/17/21 0350  TempSrc: Oral  PainSc: Asleep      Patients Stated Pain Goal: 6 ([id number])  Complications: No notable events documented.

## 2021-07-17 NOTE — Progress Notes (Signed)
Patient doctor at bedside to exam and speak with patient prior to taking to surgery. Pt family at bedside x 3 including his wife and daughter And her daughter took pt partial denture and cellphone with Consulting civil engineer. Transporter here to transport pt via bariatric bed and given copy of his hard chart with pt consent that pt signed. Pt reports feeling nervous with dry mouth. Charge nurse aware of above and at bedside also

## 2021-07-17 NOTE — Anesthesia Preprocedure Evaluation (Addendum)
Anesthesia Evaluation  Patient identified by MRN, date of birth, ID band Patient awake    Reviewed: Allergy & Precautions, NPO status , Patient's Chart, lab work & pertinent test results  Airway Mallampati: I       Dental  (+) Edentulous Upper, Edentulous Lower   Pulmonary neg pulmonary ROS,    Pulmonary exam normal        Cardiovascular hypertension, Pt. on medications Normal cardiovascular exam     Neuro/Psych negative neurological ROS  negative psych ROS   GI/Hepatic Neg liver ROS, GERD  Medicated and Controlled,  Endo/Other  negative endocrine ROS  Renal/GU negative Renal ROS  negative genitourinary   Musculoskeletal   Abdominal (+) + obese,   Peds  Hematology negative hematology ROS (+)   Anesthesia Other Findings   Reproductive/Obstetrics                            Anesthesia Physical Anesthesia Plan  ASA: 2  Anesthesia Plan: General   Post-op Pain Management: Ofirmev IV (intra-op)*   Induction: Intravenous  PONV Risk Score and Plan: Ondansetron and Midazolam  Airway Management Planned: Oral ETT  Additional Equipment: None  Intra-op Plan:   Post-operative Plan: Extubation in OR  Informed Consent: I have reviewed the patients History and Physical, chart, labs and discussed the procedure including the risks, benefits and alternatives for the proposed anesthesia with the patient or authorized representative who has indicated his/her understanding and acceptance.     Dental advisory given  Plan Discussed with: CRNA  Anesthesia Plan Comments: (2 PIV and A-line)       Anesthesia Quick Evaluation

## 2021-07-17 NOTE — Anesthesia Procedure Notes (Signed)
Procedure Name: Intubation Date/Time: 07/17/2021 7:56 AM Performed by: Waynard Edwards, CRNA Pre-anesthesia Checklist: Patient identified, Emergency Drugs available, Suction available and Patient being monitored Patient Re-evaluated:Patient Re-evaluated prior to induction Oxygen Delivery Method: Circle system utilized Preoxygenation: Pre-oxygenation with 100% oxygen Induction Type: IV induction Ventilation: Mask ventilation without difficulty Laryngoscope Size: Miller and 2 Grade View: Grade I Tube type: Oral Tube size: 7.5 mm Number of attempts: 1 Airway Equipment and Method: Stylet Placement Confirmation: ETT inserted through vocal cords under direct vision, positive ETCO2 and breath sounds checked- equal and bilateral Secured at: 23 cm Tube secured with: Tape Dental Injury: Teeth and Oropharynx as per pre-operative assessment

## 2021-07-17 NOTE — Progress Notes (Signed)
Initial Nutrition Assessment  DOCUMENTATION CODES:   Not applicable  INTERVENTION:   Multivitamin w/ minerals daily Continue Ensure Enlive po BID, each supplement provides 350 kcal and 20 grams of protein. Recommend regular diet when able to advance diet,   NUTRITION DIAGNOSIS:   Increased nutrient needs related to post-op healing as evidenced by estimated needs.  GOAL:   Patient will meet greater than or equal to 90% of their needs   MONITOR:   Weight trends, Labs, Supplement acceptance, PO intake  REASON FOR ASSESSMENT:   Malnutrition Screening Tool    ASSESSMENT:   61 y.o. male presented to the ED with increased pain. Pt admitted 3/17-3/22/23 and 4/12-07/02/21 for ongoing discitis and osteomyelitis. PMH includes HTN. Pt admitted with osteomyelitis and intermittent nausea and vomiting.   Pt unavailable at time of RD visit. Pt currently in the OR for T5-11 fusion and T8-9 corpectomy with neurosurgery. Plan to transfer to ICU post-op for close monitoring.   No meal completions have been documented through this admission.   Per EMR, pt has had a 15.6% weight loss in less than 2 months, this is clinically significant for time frame. RD unable to perform physical exam at this time due to being in the OR.   Pt with Ensure currently already ordered. Per Meds History, pt has accepted to majority of ONS that have been ordered.   RD to follow-up on pt as able.   Medications reviewed and include: Colace, Protonix, Miralax, IV antibiotics Labs reviewed.   NUTRITION - FOCUSED PHYSICAL EXAM:  Deferred to follow-up.   Diet Order:   Diet Order             Diet NPO time specified  Diet effective midnight                   EDUCATION NEEDS:   No education needs have been identified at this time  Skin:  Skin Assessment: Skin Integrity Issues: Skin Integrity Issues:: Stage II Stage II: coccyx  Last BM:  5/17  Height:   Ht Readings from Last 1 Encounters:   07/09/21 5\' 11"  (1.803 m)    Weight:   Wt Readings from Last 1 Encounters:  07/09/21 125.9 kg    Ideal Body Weight:  78.2 kg  BMI:  Body mass index is 38.71 kg/m.  Estimated Nutritional Needs:   Kcal:  2300-2500  Protein:  115-130 grams  Fluid:  >/= 2 L    09/08/21 RD, LDN Clinical Dietitian See [facility] for contact information.

## 2021-07-17 NOTE — Progress Notes (Signed)
OR called nurse and will pick up at 0630, pt npo since midnight. Pt called family and they will come to get his belongings of a partial plate  denture upper mouth and cellphone and charger. Pt spoke with wife and daughter who are coming this morning.

## 2021-07-17 NOTE — Op Note (Signed)
Providing Compassionate, Quality Care - Together  Date of service: 07/17/2021  PREOP DIAGNOSIS:  T8-9 osteodiscitis with kyphosis, progressive failure of medical management, T8-9 severe stenosis with cord signal change  POSTOP DIAGNOSIS: Same  PROCEDURE: T8, T9 corpectomy via T8 and partial T9 costotransversectomy approach, right-sided Bilateral segmental pedicle screw instrumentation T5, T6, T7, T10, T11: K2M everest pedicle screw: T5: 6.5 x 40 mm bilaterally, T6: 6.5 x 40 mm bilaterally, T7: 6.5 x 40 mm bilaterally, T10: 7.5 x 50 mm bilaterally, T11: 7.5 x 50 mm bilaterally Posterior lateral arthrodesis T5-6, T6-7, T7-8, T8-9, T9-10, T10-11 Bilateral T7, T8, T9, T10 laminectomies for decompression of neural elements Placement of anterior biomechanical device, K2M capri titanium expandable corpectomy cage, 14 x 18 mm expanded to approximately 54 mm Intraoperative use of autograft, same incision Intraoperative use of fluoroscopy Intraoperative use of microscope   SURGEON: Dr. Pieter Partridge C. Keaghan Bowens, DO  ASSISTANT: Dr. Duffy Rhody, MD  ANESTHESIA: General Endotracheal  EBL: 1200 cc, 250 cc returned with Cell Saver  SPECIMENS: T8-9 facet joint sent for microbiology, aerobic, anaerobic cultures of the T8-9 disc space sent  DRAINS: Medium Hemovac, in the epidural space  COMPLICATIONS: None  CONDITION: Hemodynamically stable  HISTORY: Michael [name] is a 61 y.o. male with a history of T8-9 osteodiscitis that failed oral antibiotic therapy, as well as IV antibiotic therapy.  Imaging revealed progressive osteodiscitis with osteolysis and kyphosis with severe stenosis at T8-9 and cord signal change.  Due to this we discussed surgical intervention in the form of a decompression instrumentation and fusion for stabilization.  Given his kyphosis and amount of osseous erosion, I recommended a T8, T9 corpectomy with a costotransversectomy approach, with T7-10 laminectomy and T5-11 pedicle  screw instrumentation and fusion.  We discussed all risks, benefits and expected outcomes as well as alternatives to treatment.  I answered all of his questions.  Informed consent was obtained.  PROCEDURE IN DETAIL: The patient was brought to the operating room. After induction of general anesthesia, the patient was positioned on the operative table in the prone position. All pressure points were meticulously padded. Skin incision was then marked out and prepped and draped in the usual sterile fashion. Physician driven timeout was performed.  Using a 10 blade, incision was made sharply in midline over the T5-T8 11 spinous processes.  Sharp dissection was performed down to the dorsal fascia.  Bovie electrocautery was used to do subperiosteal dissection bilaterally to expose the T5 lamina transverse processes, T6 lamina and transverse processes, T7 lamina transverse processes, T8 lamina and transverse processes, T9, T10, T11 lamina and transverse processes.  On the patient's right, at T8, T9, the right was also exposed laterally using Bovie electrocautery.  Deep retractors were placed on the wound.  Lateral fluoroscopy confirmed appropriate levels.  Using a high-speed drill, pilot holes were created and and pedicle finder was used to access the pedicle bilaterally.  Using a ball-tipped feeler, there was appropriate bony borders in all directions.  A 5.5 mm tap was then used to approximately 20 mm.  Again ball-tipped probe confirmed bony borders in all directions.  The above listed pedicle screw was placed with appropriate bony purchase.  Lateral fluoroscopy confirmed appropriate placement.  This was repeated bilaterally at T6, T7, T10 and T11.  AP fluoroscopy confirmed appropriate placement.  The microscope was then sterilely draped and brought into the field.  A bilateral laminectomy was performed with a high-speed drill at T7, T8, T9 and the superior portion bilaterally  at T10.  Using a bone trap, the  autograft was saved for later use.  The lamina was removed down to the ligamentum flavum and an epidural space.  The epidural space was carefully dissected from the ligamentum flavum.  The right T8-9 facet joint was sent for permanent pathology/microbiology.  The ligamentum flavum was dissected and removed from T7, T8, T9, T10 and the epidural space was identified.  This was carefully dissected and a complete right facetectomy was performed at T7-8, T8-9 and T9-10 using high-speed drill.  The epidural space was gently dissected ventrally to the thecal sac at T7-8 and the disc base was identified at T7-8.   The right T 8, T9 pedicles were removed with a high-speed drill bilaterally down to the vertebral body.  The anterior head of the T8 and T9 ribs were identified and majority of the head of the T8 rib was removed with a high-speed drill and disarticulated from the vertebral body using Penfield 1.  The lateral vertebral body was again dissected with Penfield 1 at T8 and T9.  The nerve roots at T8 and T9 were then gently dissected circumferentially with microcurette's and then using 2-0 silk tie, these were tied off and ligated and gently retracted to the patient's left.  The T9 nerve root suture was then reinforced with a 3-0 Prolene.  Using a high-speed drill, a complete corpectomy at T8 and T9 was performed.  The T8-9 disc was identified and noted to be significantly necrotic and a appearing to be burned-out osteodiscitis.  This region was cultured for aerobic and anaerobic specimen.  Again a radical discectomy was performed with a series of curettes and rongeurs and a high-speed drill.  Using high-speed drill and rongeurs, majority of the T8 and T9 vertebral bodies were removed for the corpectomy device.  Once majority of the T8 and T9 vertebral bodies were removed and there was room for corpectomy cage and then proceeded to discectomy at T7-8.  Discectomy was performed with a series of micro curettes and  pituitary rongeurs and Kerrison rongeurs.  This was repeated at T10-11.  The T7 and T10 endplates were prepared with curettes to subchondral bleeding bone.  Using calipers, the above listed corpectomy cage was selected.  The cage was filled with autograft.  This was then placed while gently retracting the thecal sac medially.  I then expanded the corpectomy cage under live fluoroscopy to the appropriate height and adjusted for slight kyphosis.  This was then locked and final tightened into place per the manufacturer's recommendation.  Hemostasis was achieved in the corpectomy site with Surgifoam.  I then packed further autograft around the corpectomy cage.  Using micro curettes, the thecal sac was dissected superiorly and inferiorly and noted to be appropriately decompressed and pulsatile.  Epidural hemostasis was achieved with Surgifoam.  Appropriate size rods were then measured, cut and contoured and placed bilaterally.  Setscrews were placed bilaterally and final tightened to the manufacturer's recommendation.  The lateral facets at T5-6, T6-7, T10-11 were decorticated and further autograft was placed laterally.  Deep retractors were taken out of the wound.  Hemostasis in the soft tissues was achieved with bipolar cautery.  I then placed a medium Hemovac in the epidural space and tunneled out laterally.  The wound was closed in layers, muscle with 0 Vicryl as well as fascia with 0 Vicryl sutures.  Dermis was closed with 2-0 Vicryl sutures and skin was closed with staples.  Sterile dressing was applied.  At the  end of the case all sponge, needle, and instrument counts were correct. The patient was then transferred to the stretcher, extubated, and taken to the post-anesthesia care unit in stable hemodynamic condition.

## 2021-07-18 ENCOUNTER — Other Ambulatory Visit: Payer: Self-pay

## 2021-07-18 DIAGNOSIS — M8629 Subacute osteomyelitis, multiple sites: Secondary | ICD-10-CM | POA: Diagnosis not present

## 2021-07-18 DIAGNOSIS — R112 Nausea with vomiting, unspecified: Secondary | ICD-10-CM | POA: Diagnosis not present

## 2021-07-18 DIAGNOSIS — M4624 Osteomyelitis of vertebra, thoracic region: Secondary | ICD-10-CM | POA: Diagnosis not present

## 2021-07-18 LAB — COMPREHENSIVE METABOLIC PANEL
ALT: 23 U/L (ref 0–44)
AST: 30 U/L (ref 15–41)
Albumin: 3.4 g/dL — ABNORMAL LOW (ref 3.5–5.0)
Alkaline Phosphatase: 59 U/L (ref 38–126)
Anion gap: 10 (ref 5–15)
BUN: 9 mg/dL (ref 6–20)
CO2: 19 mmol/L — ABNORMAL LOW (ref 22–32)
Calcium: 8.6 mg/dL — ABNORMAL LOW (ref 8.9–10.3)
Chloride: 108 mmol/L (ref 98–111)
Creatinine, Ser: 0.93 mg/dL (ref 0.61–1.24)
GFR, Estimated: >60 mL/min (ref >60)
Glucose, Bld: 105 mg/dL — ABNORMAL HIGH (ref 70–99)
Potassium: 4.5 mmol/L (ref 3.5–5.1)
Sodium: 137 mmol/L (ref 135–145)
Total Bilirubin: 1.5 mg/dL — ABNORMAL HIGH (ref 0.3–1.2)
Total Protein: 5.4 g/dL — ABNORMAL LOW (ref 6.5–8.1)

## 2021-07-18 LAB — CBC WITH DIFFERENTIAL/PLATELET
Abs Immature Granulocytes: 0.5 10*3/uL — ABNORMAL HIGH (ref 0.00–0.07)
Basophils Absolute: 0.1 10*3/uL (ref 0.0–0.1)
Basophils Relative: 0 %
Eosinophils Absolute: 0 10*3/uL (ref 0.0–0.5)
Eosinophils Relative: 0 %
HCT: 29.7 % — ABNORMAL LOW (ref 39.0–52.0)
Hemoglobin: 9.7 g/dL — ABNORMAL LOW (ref 13.0–17.0)
Immature Granulocytes: 3 %
Lymphocytes Relative: 9 %
Lymphs Abs: 1.3 10*3/uL (ref 0.7–4.0)
MCH: 30.5 pg (ref 26.0–34.0)
MCHC: 32.7 g/dL (ref 30.0–36.0)
MCV: 93.4 fL (ref 80.0–100.0)
Monocytes Absolute: 1.6 10*3/uL — ABNORMAL HIGH (ref 0.1–1.0)
Monocytes Relative: 11 %
Neutro Abs: 11.3 10*3/uL — ABNORMAL HIGH (ref 1.7–7.7)
Neutrophils Relative %: 77 %
Platelets: 178 10*3/uL (ref 150–400)
RBC: 3.18 MIL/uL — ABNORMAL LOW (ref 4.22–5.81)
RDW: 17.5 % — ABNORMAL HIGH (ref 11.5–15.5)
WBC: 14.8 10*3/uL — ABNORMAL HIGH (ref 4.0–10.5)
nRBC: 0 % (ref 0.0–0.2)

## 2021-07-18 LAB — GLUCOSE, CAPILLARY
Glucose-Capillary: 98 mg/dL (ref 70–99)
Glucose-Capillary: 101 mg/dL — ABNORMAL HIGH (ref 70–99)
Glucose-Capillary: 120 mg/dL — ABNORMAL HIGH (ref 70–99)
Glucose-Capillary: 118 mg/dL — ABNORMAL HIGH (ref 70–99)
Glucose-Capillary: 89 mg/dL (ref 70–99)
Glucose-Capillary: 149 mg/dL — ABNORMAL HIGH (ref 70–99)

## 2021-07-18 LAB — CK: Total CK: 353 U/L (ref 49–397)

## 2021-07-18 MED ORDER — DOCUSATE SODIUM 100 MG PO CAPS: 100.0000 mg | ORAL_CAPSULE | Freq: Two times a day (BID) | ORAL | Status: DC

## 2021-07-18 MED ORDER — LACTATED RINGERS IV SOLN: INTRAVENOUS | Status: DC

## 2021-07-18 MED ORDER — ONDANSETRON HCL 4 MG/2ML IJ SOLN
4.0000 mg | Freq: Three times a day (TID) | INTRAMUSCULAR | Status: DC
  Administered 2021-07-18 – 2021-07-22 (×8): 4 mg via INTRAVENOUS
  Filled 2021-07-18 (×13): qty 2

## 2021-07-18 MED ORDER — POLYETHYLENE GLYCOL 3350 17 G PO PACK
17.0000 g | PACK | Freq: Two times a day (BID) | ORAL | Status: DC
  Administered 2021-07-20 – 2021-07-22 (×2): 17 g via ORAL
  Filled 2021-07-18 (×10): qty 1

## 2021-07-18 MED FILL — Heparin Sodium (Porcine) Inj 1000 Unit/ML: INTRAMUSCULAR | Qty: 30 | Status: AC

## 2021-07-18 MED FILL — Thrombin For Soln 5000 Unit: CUTANEOUS | Qty: 5000 | Status: AC

## 2021-07-18 MED FILL — Sodium Chloride IV Soln 0.9%: INTRAVENOUS | Qty: 1000 | Status: AC

## 2021-07-18 NOTE — Progress Notes (Addendum)
Subjective: Patient reports that he is continuing to experience moderate to severe back pain that is different that what he was experiencing prior to surgery. The patient expressed reservation about increasing his mobility today with therapies as he does not feel he is ready to sit up. Moderate xerostomia. No acute events overnight.   Objective: Vital signs in last 24 hours: Temp:  [97.8 F (36.6 C)-99.5 F (37.5 C)] 98.1 F (36.7 C) (05/19 0400) Pulse Rate:  [92-121] 102 (05/19 0700) Resp:  [13-20] 17 (05/19 0700) BP: (105-158)/(58-127) 117/62 (05/19 0700) SpO2:  [92 %-100 %] 95 % (05/19 0700) Arterial Line BP: (81-166)/(55-99) 128/81 (05/18 2300)  Intake/Output from previous day: 05/18 0701 - 05/19 0700 In: 6160.4 [I.V.:2700; Blood:565; IV Piggyback:2895.4] Out: 2335 [Urine:945; Drains:190; Blood:1200] Intake/Output this shift: No intake/output data recorded.  Physical Exam: Patient is awake, A/O X 4, and conversant. He has a flat affect and depressed mood. Eyes open spontaneously. They are in NAD and VSS other than tachycardia with HRs in the low 100's. Speech is fluent and appropriate. MAEW with good strength that is symmetric bilaterally. BUE 4+/5 throughout, BLE 4+/5 throughout. No clonus appreciated in BLE. No pathological reflexes. Sensation to light touch is intact. PERLA, EOMI. CNs grossly intact. Dressing is clean dry intact. Incision is well approximated with no drainage, erythema, or edema. Hemovac with approximately 190 ml of sanguineous  drainage overnight.    Lab Results: Recent Labs    07/17/21 2028 07/18/21 0539  WBC 17.7* 14.8*  HGB 9.5* 9.7*  HCT 29.3* 29.7*  PLT 160 178   BMET Recent Labs    07/17/21 2028 07/18/21 0539  NA 137 137  K 3.8 4.5  CL 109 108  CO2 19* 19*  GLUCOSE 159* 105*  BUN 8 9  CREATININE 1.02 0.93  CALCIUM 8.5* 8.6*    Studies/Results: DG Thoracic Spine 2 View  Result Date: 07/17/2021 CLINICAL DATA:  T8 and T9 corpectomy, T5  through T11 fusion EXAM: THORACIC SPINE 2 VIEWS COMPARISON:  Thoracic spine MRI 07/10/2021 FINDINGS: Two C-arm fluoroscopic images were obtained intraoperatively and submitted for post operative interpretation. Surgical levels can not be adequately assessed on the current study due to limited field of view. Postsurgical changes reflecting bilateral pedicle screws and 2 level corpectomy are seen on the initial image. Subsequent images demonstrate placement of interlocking rods. Fluoro time 31 seconds, dose 11.09 mGy. Please see the performing provider's procedural report for further detail. IMPRESSION: Intraoperative images during thoracic spine corpectomy and posterior instrumented fusion. Electronically Signed   By: Lesia Hausen M.D.   On: 07/17/2021 15:07   DG C-Arm 1-60 Min-No Report  Result Date: 07/17/2021 Fluoroscopy was utilized by the requesting physician.  No radiographic interpretation.   DG C-Arm 1-60 Min-No Report  Result Date: 07/17/2021 Fluoroscopy was utilized by the requesting physician.  No radiographic interpretation.   DG C-Arm 1-60 Min-No Report  Result Date: 07/17/2021 Fluoroscopy was utilized by the requesting physician.  No radiographic interpretation.   DG C-Arm 1-60 Min-No Report  Result Date: 07/17/2021 Fluoroscopy was utilized by the requesting physician.  No radiographic interpretation.   DG C-Arm 1-60 Min-No Report  Result Date: 07/17/2021 Fluoroscopy was utilized by the requesting physician.  No radiographic interpretation.   DG C-Arm 1-60 Min-No Report  Result Date: 07/17/2021 Fluoroscopy was utilized by the requesting physician.  No radiographic interpretation.   DG C-Arm 1-60 Min-No Report  Result Date: 07/17/2021 Fluoroscopy was utilized by the requesting physician.  No radiographic interpretation.  Assessment/Plan: Patient is POD #1 s/p T5-11 fusion with T8-9 corpectomy due to osteodiscitis with kyphosis and stenosis. He is recovering well and  reports that his back pain persists but is of a different character than it was preoperatively. His neurological examination is unchanged and is stable.  He has no new complaints of weakness or paresthesia.  He continues to have a flat affect and depressed mood.  He requested not to work with therapies today.  We had a long discussion about how it is important for early mobilization and the need to work with therapies.  I encouraged the patient to work with his nurse and therapy team to ensure his pain is addressed prior to working with therapies.  I also encouraged the patient's RN to provide the patient with mobility opportunities.  He is awaiting PT/OT evaluation. Continue working on pain control and encouraging mobility. Will plan to keep him in the ICU today with plan to transfer back to the floor tomorrow.   -TLSO when OOB -PT/OT  -Pain control -Encourage mobility -Continue hemovac, will reassess readiness for removal tomorrow morning    LOS: 9 days    Council Mechanic, DNP, AGNP-C Neurosurgery Nurse Practitioner  [facility] [address], [place], [place] [address] P: [phone]    F: [phone]  07/18/2021, 8:19 AM  Addendum: Patient seen and examined.  Agree with above.  Likely will need PICC line for long-term IV antibiotics.  Intraoperative culture sent.  Patient needs aggressive encouragement for movement and PT/OT treatment.  He has significant lack of drive to increase his mobility.  I discussed this with the patient and his family, recommend that he increase his activity daily.  He understands that his immobility puts him at high risk for perioperative complications such as pneumonia, DVT, wound infection or breakdown.

## 2021-07-18 NOTE — Progress Notes (Signed)
[facility] Infectious Diseases Follow Up Note  Patient Identification: Patient Name: Michael [name] MRN: [id number] Admit Date: 07/09/2021  7:16 PM Age: 61 y.o.Today's Date: 07/18/2021  Reason for Visit: discitis and osteomyelitis   Principal Problem:   Osteomyelitis of thoracic region [facility]) Active Problems:   Essential hypertension   Thoracic discitis   Poor dentition   Nausea and vomiting  Antibiotics:  Vancomycin 5/10-5/11, Daptomycin 5/13-c Pip/tazo 5/10-5/11, ceftriaxone 5/13-c   Lines/Hardware:  Interval Events: remains afebrile, WBC down to 14  Assessment #T8-T9 discitis and osteomyelitis with progressive bony erosion and spinal stenosis, cord compression #Paraspinal soft tissue thickening with multiple small paraspinous soft tissue abscesses - Blood cx 5/11 nop growth in 5 days  - s/p T8-T9 corpectomy, bilateral segmental pedicle screw instrumentation T5,T6, T7,T10, T11; Posterior lateral arthrodesis T5-6, T6-7, T7-8, T8-9, T9-10, T 10-11; Bilatera; t7-t10 laminectomies for decompression, placement of anterior biomechanical device, K2M capri titanium expandable corpectomy cage and use of autograft. 5/18 OR cx sent  # Medication monitoring - CPK 353 # Leukocytosis - downtrending # AKI - resolved  # h/o E coli bacteremia and thoracic discitis in 04/2021   Recommendations Continue daptomycin and ceftriaxone as is pending OR cultures  Fu OR cultures to make any changed in abtx Expect prolonged course of antibiotics, 6-8 weeks from 5/18 Monitor CBC, CMP and CPK Dr Renold Don covering this weekend and will follow cultures. New ID team will assume care starting Monday.   Rest of the management as per the primary team. Thank you for the consult. Please page with pertinent questions or concerns.  ______________________________________________________________________ Subjective patient seen and examined at the bedside.   Complains of back soreness Denies nausea, vomiting and diarrhea   Vitals BP 117/62   Pulse (!) 102   Temp 98.1 F (36.7 C) (Axillary)   Resp 17   Ht 5\' 11"  (1.803 m)   Wt 125.9 kg   SpO2 95%   BMI 38.71 kg/m      Physical Exam Constitutional:  sitting up in the bed, obese     Comments:   Cardiovascular:     Rate and Rhythm: Normal rate and regular rhythm.     Heart sounds:  Pulmonary:     Effort: Pulmonary effort is normal on room air     Comments:   Abdominal:     Palpations: Abdomen is soft.     Tenderness: non distended   Neurological:     General: grossly non focal, awake, alert and oriented,m able to lift both lower extremities   Psychiatric:        Mood and Affect: Mood normal.   Pertinent Microbiology Results for orders placed or performed during the hospital encounter of 07/09/21  MRSA Next Gen by PCR, Nasal     Status: None   Collection Time: 07/10/21  6:45 AM   Specimen: Nasal Mucosa; Nasal Swab  Result Value Ref Range Status   MRSA by PCR Next Gen NOT DETECTED NOT DETECTED Final    Comment: (NOTE) The GeneXpert MRSA Assay (FDA approved for NASAL specimens only), is one component of a comprehensive MRSA colonization surveillance program. It is not intended to diagnose MRSA infection nor to guide or monitor treatment for MRSA infections. Test performance is not FDA approved in patients less than 8 years old. Performed at [facility], [address], [place], [place]   Culture, blood (Routine X 2) w Reflex to ID Panel     Status: None  Collection Time: 07/10/21  2:59 PM   Specimen: BLOOD  Result Value Ref Range Status   Specimen Description BLOOD LEFT ANTECUBITAL  Final   Special Requests   Final    BOTTLES DRAWN AEROBIC AND ANAEROBIC Blood Culture adequate volume   Culture   Final    NO GROWTH 5 DAYS Performed at [facility], [address] [place], [place] [address]    Report Status 07/15/2021 FINAL  Final   Culture, blood (Routine X 2) w Reflex to ID Panel     Status: None   Collection Time: 07/10/21  3:00 PM   Specimen: BLOOD  Result Value Ref Range Status   Specimen Description BLOOD RIGHT ANTECUBITAL  Final   Special Requests   Final    BOTTLES DRAWN AEROBIC AND ANAEROBIC Blood Culture adequate volume   Culture   Final    NO GROWTH 5 DAYS Performed at [facility], [address] [place], [place] [address]    Report Status 07/15/2021 FINAL  Final  Aerobic/Anaerobic Culture w Gram Stain (surgical/deep wound)     Status: None (Preliminary result)   Collection Time: 07/17/21 10:28 AM   Specimen: Soft Tissue, Other  Result Value Ref Range Status   Specimen Description TISSUE  Final   Special Requests THRACIC EIGHT NINE JOINT FOR CULTURE  Final   Gram Stain NO WBC SEEN NO ORGANISMS SEEN   Final   Culture   Final    NO GROWTH < 24 HOURS Performed at [facility], [address] [place], [place] [address]    Report Status PENDING  Incomplete  Aerobic/Anaerobic Culture w Gram Stain (surgical/deep wound)     Status: None (Preliminary result)   Collection Time: 07/17/21 12:27 PM   Specimen: Soft Tissue, Other  Result Value Ref Range Status   Specimen Description WOUND  Final   Special Requests THORAIC EIGHT NINE DISC SPACE WOUND CULTURE SPEC B  Final   Gram Stain NO WBC SEEN NO ORGANISMS SEEN   Final   Culture   Final    NO GROWTH < 24 HOURS Performed at [facility], [address] [place], [place] [address]    Report Status PENDING  Incomplete  MRSA Next Gen by PCR, Nasal     Status: None   Collection Time: 07/17/21  6:28 PM   Specimen: Nasal Mucosa; Nasal Swab  Result Value Ref Range Status   MRSA by PCR Next Gen NOT DETECTED NOT DETECTED Final    Comment: (NOTE) The GeneXpert MRSA Assay (FDA approved for NASAL specimens only), is one component of a comprehensive MRSA colonization surveillance program. It is not intended to diagnose MRSA infection nor  to guide or monitor treatment for MRSA infections. Test performance is not FDA approved in patients less than 11038 years old. Performed at [facility], [address] [place], [place] [address]     Pertinent Lab.    Latest Ref Rng & Units 07/18/2021    5:39 AM 07/17/2021    8:28 PM 07/17/2021    4:05 PM  CBC  WBC 4.0 - 10.5 K/uL 14.8   17.7     Hemoglobin 13.0 - 17.0 g/dL 9.7   9.5   29.511.2    Hematocrit 39.0 - 52.0 % 29.7   29.3   33.0    Platelets 150 - 400 K/uL 178   160         Latest Ref Rng & Units  07/18/2021    5:39 AM 07/17/2021    8:28 PM 07/17/2021    4:05 PM  CMP  Glucose 70 - 99 mg/dL 474   259     BUN 6 - 20 mg/dL 9   8     Creatinine 5.63 - 1.24 mg/dL 8.75   6.43     Sodium 135 - 145 mmol/L 137   137   137    Potassium 3.5 - 5.1 mmol/L 4.5   3.8   4.5    Chloride 98 - 111 mmol/L 108   109     CO2 22 - 32 mmol/L 19   19     Calcium 8.9 - 10.3 mg/dL 8.6   8.5     Total Protein 6.5 - 8.1 g/dL 5.4      Total Bilirubin 0.3 - 1.2 mg/dL 1.5      Alkaline Phos 38 - 126 U/L 59      AST 15 - 41 U/L 30      ALT 0 - 44 U/L 23         Pertinent Imaging today Plain films and CT images have been personally visualized and interpreted; radiology reports have been reviewed. Decision making incorporated into the Impression / Recommendations.  DG Thoracic Spine 2 View  Result Date: 07/17/2021 CLINICAL DATA:  T8 and T9 corpectomy, T5 through T11 fusion EXAM: THORACIC SPINE 2 VIEWS COMPARISON:  Thoracic spine MRI 07/10/2021 FINDINGS: Two C-arm fluoroscopic images were obtained intraoperatively and submitted for post operative interpretation. Surgical levels can not be adequately assessed on the current study due to limited field of view. Postsurgical changes reflecting bilateral pedicle screws and 2 level corpectomy are seen on the initial image. Subsequent images demonstrate placement of interlocking rods. Fluoro time 31 seconds, dose 11.09 mGy. Please see the performing  provider's procedural report for further detail. IMPRESSION: Intraoperative images during thoracic spine corpectomy and posterior instrumented fusion. Electronically Signed   By: Lesia Hausen M.D.   On: 07/17/2021 15:07   DG C-Arm 1-60 Min-No Report  Result Date: 07/17/2021 Fluoroscopy was utilized by the requesting physician.  No radiographic interpretation.   DG C-Arm 1-60 Min-No Report  Result Date: 07/17/2021 Fluoroscopy was utilized by the requesting physician.  No radiographic interpretation.   DG C-Arm 1-60 Min-No Report  Result Date: 07/17/2021 Fluoroscopy was utilized by the requesting physician.  No radiographic interpretation.   DG C-Arm 1-60 Min-No Report  Result Date: 07/17/2021 Fluoroscopy was utilized by the requesting physician.  No radiographic interpretation.   DG C-Arm 1-60 Min-No Report  Result Date: 07/17/2021 Fluoroscopy was utilized by the requesting physician.  No radiographic interpretation.   DG C-Arm 1-60 Min-No Report  Result Date: 07/17/2021 Fluoroscopy was utilized by the requesting physician.  No radiographic interpretation.   DG C-Arm 1-60 Min-No Report  Result Date: 07/17/2021 Fluoroscopy was utilized by the requesting physician.  No radiographic interpretation.     I spent approx 52  minutes for this patient encounter including review of prior medical records, coordination of care with primary/other specialist with greater than 50% of time being face to face/counseling and discussing diagnostics/treatment plan with the patient/family.  Electronically signed by:   Odette Fraction, MD Infectious Disease Physician [facility] Pager: [phone]

## 2021-07-18 NOTE — Progress Notes (Incomplete)
[facility] Infectious Diseases Follow Up Note  Patient Identification: Patient Name: Michael [name] MRN: [id number] Admit Date: 07/09/2021  7:16 PM Age: 61 y.o.Today's Date: 07/18/2021  Reason for Visit: discitis and osteomyelitis   Principal Problem:   Osteomyelitis of thoracic region [facility](HCC) Active Problems:   Essential hypertension   Thoracic discitis   Poor dentition   Nausea and vomiting  Antibiotics:  Vancomycin 5/10-5/11, Daptomycin 5/13-c Pip/tazo 5/10-5/11, ceftriaxone 5/13-c   Lines/Hardware:  Interval Events: remains afebrile, Plan for OR noted on 5/18  Assessment #T8-T9 discitis and osteomyelitis with progressive bony erosion and spinal stenosis, cord compression #Paraspinal soft tissue thickening with multiple small paraspinous soft tissue abscesses  -Neurosurgery following, plan for OR on 5/18 for posterior decompression for stenosis with instrumentation and fusion T5-T11 noted - Blood cx 5/11 nop growth in 5 days   # AKI - resolved  # h/o E coli bacteremia and thoracic discitis in 04/2021   Recommendations Continue daptomycin and ceftriaxone as is Monitor CPK Follow-up neurosurgery recommendations, please send surgical specimen for cultures and path  as appropriate Will follow intermittently this week.  Please call with questions  Rest of the management as per the primary team. Thank you for the consult. Please page with pertinent questions or concerns.  ______________________________________________________________________ Subjective patient seen and examined at the bedside.  Working with physical therapy Tells me not to remind about surgery tomorrow  Vitals BP 117/62   Pulse (!) 102   Temp 98.1 F (36.7 C) (Axillary)   Resp 17   Ht 5\' 11"  (1.803 m)   Wt 125.9 kg   SpO2 95%   BMI 38.71 kg/m      Physical Exam Constitutional:  sitting up in the bed and working with PT    Comments:    Cardiovascular:     Rate and Rhythm: Normal rate and regular rhythm.     Heart sounds:  Pulmonary:     Effort: Pulmonary effort is normal on room air     Comments:   Abdominal:     Palpations: Abdomen is soft.     Tenderness: non distended   Neurological:     General: grossly non focal, awake, alert and oriented   Psychiatric:        Mood and Affect: Mood normal.   Pertinent Microbiology Results for orders placed or performed during the hospital encounter of 07/09/21  MRSA Next Gen by PCR, Nasal     Status: None   Collection Time: 07/10/21  6:45 AM   Specimen: Nasal Mucosa; Nasal Swab  Result Value Ref Range Status   MRSA by PCR Next Gen NOT DETECTED NOT DETECTED Final    Comment: (NOTE) The GeneXpert MRSA Assay (FDA approved for NASAL specimens only), is one component of a comprehensive MRSA colonization surveillance program. It is not intended to diagnose MRSA infection nor to guide or monitor treatment for MRSA infections. Test performance is not FDA approved in patients less than 630 years old. Performed at [facility], [address] [place], [place] [id number]   Culture, blood (Routine X 2) w Reflex to ID Panel     Status: None   Collection Time: 07/10/21  2:59 PM   Specimen: BLOOD  Result Value Ref Range Status   Specimen Description BLOOD LEFT ANTECUBITAL  Final   Special Requests   Final    BOTTLES DRAWN AEROBIC AND ANAEROBIC Blood Culture adequate volume   Culture   Final    NO GROWTH 5  DAYS Performed at [facility], [address] [place], [place] [address]    Report Status 07/15/2021 FINAL  Final  Culture, blood (Routine X 2) w Reflex to ID Panel     Status: None   Collection Time: 07/10/21  3:00 PM   Specimen: BLOOD  Result Value Ref Range Status   Specimen Description BLOOD RIGHT ANTECUBITAL  Final   Special Requests   Final    BOTTLES DRAWN AEROBIC AND ANAEROBIC Blood Culture adequate volume   Culture   Final    NO GROWTH 5  DAYS Performed at [facility], [address], [place], [place] [address]    Report Status 07/15/2021 FINAL  Final  Aerobic/Anaerobic Culture w Gram Stain (surgical/deep wound)     Status: None (Preliminary result)   Collection Time: 07/17/21 10:28 AM   Specimen: Soft Tissue, Other  Result Value Ref Range Status   Specimen Description TISSUE  Final   Special Requests THRACIC EIGHT NINE JOINT FOR CULTURE  Final   Gram Stain NO WBC SEEN NO ORGANISMS SEEN   Final   Culture   Final    NO GROWTH < 24 HOURS Performed at [facility], [address] [place], [place] [address]    Report Status PENDING  Incomplete  Aerobic/Anaerobic Culture w Gram Stain (surgical/deep wound)     Status: None (Preliminary result)   Collection Time: 07/17/21 12:27 PM   Specimen: Soft Tissue, Other  Result Value Ref Range Status   Specimen Description WOUND  Final   Special Requests THORAIC EIGHT NINE DISC SPACE WOUND CULTURE SPEC B  Final   Gram Stain NO WBC SEEN NO ORGANISMS SEEN   Final   Culture   Final    NO GROWTH < 24 HOURS Performed at [facility], [address] [place], [place] [address]    Report Status PENDING  Incomplete  MRSA Next Gen by PCR, Nasal     Status: None   Collection Time: 07/17/21  6:28 PM   Specimen: Nasal Mucosa; Nasal Swab  Result Value Ref Range Status   MRSA by PCR Next Gen NOT DETECTED NOT DETECTED Final    Comment: (NOTE) The GeneXpert MRSA Assay (FDA approved for NASAL specimens only), is one component of a comprehensive MRSA colonization surveillance program. It is not intended to diagnose MRSA infection nor to guide or monitor treatment for MRSA infections. Test performance is not FDA approved in patients less than 81 years old. Performed at [facility], [address], [place], [place] [address]     Pertinent Lab.    Latest Ref Rng & Units 07/18/2021    5:39 AM 07/17/2021    8:28 PM 07/17/2021    4:05 PM  CBC  WBC 4.0 - 10.5  K/uL 14.8   17.7     Hemoglobin 13.0 - 17.0 g/dL 9.7   9.5   66.4    Hematocrit 39.0 - 52.0 % 29.7   29.3   33.0    Platelets 150 - 400 K/uL 178   160         Latest Ref Rng & Units 07/18/2021    5:39 AM 07/17/2021    8:28 PM 07/17/2021    4:05 PM  CMP  Glucose 70 - 99 mg/dL 403   474     BUN 6 - 20 mg/dL 9   8     Creatinine 2.59 - 1.24 mg/dL 5.63   8.75  Sodium 135 - 145 mmol/L 137   137   137    Potassium 3.5 - 5.1 mmol/L 4.5   3.8   4.5    Chloride 98 - 111 mmol/L 108   109     CO2 22 - 32 mmol/L 19   19     Calcium 8.9 - 10.3 mg/dL 8.6   8.5     Total Protein 6.5 - 8.1 g/dL 5.4      Total Bilirubin 0.3 - 1.2 mg/dL 1.5      Alkaline Phos 38 - 126 U/L 59      AST 15 - 41 U/L 30      ALT 0 - 44 U/L 23         Pertinent Imaging today Plain films and CT images have been personally visualized and interpreted; radiology reports have been reviewed. Decision making incorporated into the Impression / Recommendations.  DG Thoracic Spine 2 View  Result Date: 07/17/2021 CLINICAL DATA:  T8 and T9 corpectomy, T5 through T11 fusion EXAM: THORACIC SPINE 2 VIEWS COMPARISON:  Thoracic spine MRI 07/10/2021 FINDINGS: Two C-arm fluoroscopic images were obtained intraoperatively and submitted for post operative interpretation. Surgical levels can not be adequately assessed on the current study due to limited field of view. Postsurgical changes reflecting bilateral pedicle screws and 2 level corpectomy are seen on the initial image. Subsequent images demonstrate placement of interlocking rods. Fluoro time 31 seconds, dose 11.09 mGy. Please see the performing provider's procedural report for further detail. IMPRESSION: Intraoperative images during thoracic spine corpectomy and posterior instrumented fusion. Electronically Signed   By: Lesia Hausen M.D.   On: 07/17/2021 15:07   DG C-Arm 1-60 Min-No Report  Result Date: 07/17/2021 Fluoroscopy was utilized by the requesting physician.  No radiographic  interpretation.   DG C-Arm 1-60 Min-No Report  Result Date: 07/17/2021 Fluoroscopy was utilized by the requesting physician.  No radiographic interpretation.   DG C-Arm 1-60 Min-No Report  Result Date: 07/17/2021 Fluoroscopy was utilized by the requesting physician.  No radiographic interpretation.   DG C-Arm 1-60 Min-No Report  Result Date: 07/17/2021 Fluoroscopy was utilized by the requesting physician.  No radiographic interpretation.   DG C-Arm 1-60 Min-No Report  Result Date: 07/17/2021 Fluoroscopy was utilized by the requesting physician.  No radiographic interpretation.   DG C-Arm 1-60 Min-No Report  Result Date: 07/17/2021 Fluoroscopy was utilized by the requesting physician.  No radiographic interpretation.   DG C-Arm 1-60 Min-No Report  Result Date: 07/17/2021 Fluoroscopy was utilized by the requesting physician.  No radiographic interpretation.     I spent approx 52  minutes for this patient encounter including review of prior medical records, coordination of care with primary/other specialist with greater than 50% of time being face to face/counseling and discussing diagnostics/treatment plan with the patient/family.  Electronically signed by:   Odette Fraction, MD Infectious Disease Physician [facility] Pager: [phone]

## 2021-07-18 NOTE — Progress Notes (Signed)
This RN noted left arterial line dressing to be saturating with blood. Pressure was held for 5 minutes. Pt stated he was unaware of it bleeding. At this time RN removed Left radial arterial line, catheter was intact. All VS stable, pt tolerated removal well. Will continue to follow care plan.

## 2021-07-18 NOTE — Progress Notes (Signed)
Physical Therapy Re-evaluation Patient Details Name: Michael [name] MRN: [id number] DOB: 1960-09-30 Today's Date: 07/18/2021   History of Present Illness 61 y/o man presents to [facility] hospital 07/09/2021 with multiple recent admissions of progressive discititis and osteomyelitis, now with worsening pain. CT revealed progressive osteomyelitis/discititis at T8-9 compared to recent studies with bony erosion and possible abscesses. Pt underwent T5-11 fusion with T8-9 corpectomy on 5/18. PMH includes HTN, obesity.    PT Comments    PT downgrades goals and extends plan of care after T8-9 corpectomy and T5-11 fusion. Pt is resistant to bed mobility initially but does gradually participate in initiation of rolling with encouragement. PT provides thorough education on the need for progressive mobility to aide in pain reduction. Pt is encouraged to continue to perform AROM with all extremities as well as scapular movements to relax musculature. Pt will benefit from continued acute PT services in an effort to reduce caregiver burden and falls risk. PT recommends SNF placement.  Recommendations for follow up therapy are one component of a multi-disciplinary discharge planning process, led by the attending physician.  Recommendations may be updated based on patient status, additional functional criteria and insurance authorization.  Follow Up Recommendations  Skilled nursing-short term rehab (<3 hours/day)     Assistance Recommended at Discharge Intermittent Supervision/Assistance  Patient can return home with the following Two people to help with walking and/or transfers;Two people to help with bathing/dressing/bathroom;Assistance with cooking/housework;Assist for transportation;Help with stairs or ramp for entrance   Equipment Recommendations  Wheelchair (measurements PT);Wheelchair cushion (measurements PT);Hospital bed (hoyer lift)    Recommendations for Other Services       Precautions / Restrictions  Precautions Precautions: Fall;Back Precaution Booklet Issued: No Precaution Comments: verbally reviewed log roll technique Required Braces or Orthoses:  (no brace needed) Restrictions Weight Bearing Restrictions: No     Mobility  Bed Mobility Overal bed mobility: Needs Assistance Bed Mobility: Rolling Rolling: Min assist (only ~50% roll) Sidelying to sit:  (pt refuses attempts at sitting upright)       General bed mobility comments: pt rolls ~50% to left side after gradual initiation of UE cross body movements and knee/hip flexion with pelvic rotation to each side for multiple trials    Transfers                        Ambulation/Gait                   Stairs             Wheelchair Mobility    Modified Rankin (Stroke Patients Only)       Balance                                            Cognition Arousal/Alertness: Awake/alert Behavior During Therapy: Anxious Overall Cognitive Status: Within Functional Limits for tasks assessed                                          Exercises General Exercises - Upper Extremity Shoulder Flexion: AAROM, Both, 10 reps Elbow Flexion: AAROM, Both, 10 reps Elbow Extension: AAROM, Both, 10 reps General Exercises - Lower Extremity Ankle Circles/Pumps: AROM, Both, 5 reps Heel Slides: AROM, Both, 10 reps  General Comments General comments (skin integrity, edema, etc.): VSS on RA      Pertinent Vitals/Pain Pain Assessment Pain Assessment: 0-10 Pain Score: 10-Worst pain ever Pain Descriptors / Indicators: Aching Pain Intervention(s): Premedicated before session    Home Living                          Prior Function            PT Goals (current goals can now be found in the care plan section) Acute Rehab PT Goals Patient Stated Goal: feel better PT Goal Formulation: With patient/family Time For Goal Achievement: 08/01/21 Potential to Achieve  Goals: Poor Progress towards PT goals: Goals downgraded-see care plan    Frequency    Min 3X/week      PT Plan Current plan remains appropriate    Co-evaluation PT/OT/SLP Co-Evaluation/Treatment: Yes Reason for Co-Treatment: Complexity of the patient's impairments (multi-system involvement);Necessary to address cognition/behavior during functional activity;For patient/therapist safety PT goals addressed during session: Mobility/safety with mobility;Balance;Strengthening/ROM        AM-PAC PT "6 Clicks" Mobility   Outcome Measure  Help needed turning from your back to your side while in a flat bed without using bedrails?: Total Help needed moving from lying on your back to sitting on the side of a flat bed without using bedrails?: Total Help needed moving to and from a bed to a chair (including a wheelchair)?: Total Help needed standing up from a chair using your arms (e.g., wheelchair or bedside chair)?: Total Help needed to walk in hospital room?: Total Help needed climbing 3-5 steps with a railing? : Total 6 Click Score: 6    End of Session   Activity Tolerance: Patient limited by pain Patient left: in bed;with call bell/phone within reach;with family/visitor present;with bed alarm set Nurse Communication: Mobility status;Need for lift equipment PT Visit Diagnosis: Pain;Difficulty in walking, not elsewhere classified (R26.2) Pain - part of body:  (back)     Time: 3818-2993 PT Time Calculation (min) (ACUTE ONLY): 26 min  Charges:  1 Re-eval                    Arlyss Gandy, PT, DPT Acute Rehabilitation Pager: [phone] Office [phone]    Arlyss Gandy 07/18/2021, 12:08 PM

## 2021-07-18 NOTE — Progress Notes (Signed)
Chaplain responded to spiritual care consult. Pt was in bed with his mother at his bedside. Chaplain asked open ended questions to facilitate story telling and emotional expression. Michael [name] shared that he was told he would have to have this surgery or he would never walk again. His mother expressed her concern that Michael [name] might have died if he had not had the surgery, so she and his children pleaded with him to proceed. [name] shared that he has never experienced significant pain like he's had with the infection and now recovery. Chaplain engaged in conversation to reframe pain from persistent illness vs healing pain from recovery. Pt named that his faith is important to him and bragged on his son who is a Programmer, multimedia. Chaplain provided prayer in the pt's tradition at his request.  Please page as further needs arise.  Maryanna Shape. Carley Hammed, M.Div. [facility] Chaplain Pager [phone] Office [phone]

## 2021-07-18 NOTE — Progress Notes (Signed)
Pt refusing oral medications and mouth care. Educated patient on benefits of oral medications due. Pt still refuses to take, stating he has a terrible "gag" reflex. RN encouraged pt to try taking sips of water, but pt refused. Pt oral mucosa is very dry, and lips are cracked and bleeding. This RN offered to swab mouth with mouth rinse or water and to apply moisturizer to lips. Pt educated and still refused care. Pt VS stable now. Will continue plan of care.   Royal Piedra, RN

## 2021-07-18 NOTE — Consult Note (Signed)
NAME:  Michael [name], MRN:  [id number], DOB:  1960-09-05, LOS: 9 ADMISSION DATE:  07/09/2021, CONSULTATION DATE:  07/17/21 REFERRING MD:  Elgergawy - [facility], CHIEF COMPLAINT:  osteo thoracic spine    History of Present Illness:   History obtained from chart review   61 yo M PMH T8-9 discitis/osteomyelitis with associated e coli bacteremia requiring course of rocephin, cefadroxyl but has had ongoing pain since. Ultimately presented to [facility] for pain 5/10 and obtained a CT again showinng T8-T9 osteomyelitis, discitis -- now with evidence of bony erosion and progression since prior. Transferred to [facility] for NSGY evaluation, admitted to [facility]. Started on vanc, zosyn. ID consulted as well as NSGY -- abx changed to dapto rocephin. Seen by psych for passive SI.   Ultimately 5/18 he went for T5-T11 fusion, T8-T9 corpectomy 5/18 with NSGY and is planned for ICU stay post op.   PCCM consulted in this setting   Pertinent  Medical History  Thoracic osteomyelitis HTN Medication noncompliance  Ecoli bacteremia   Significant Hospital Events: Including procedures, antibiotic start and stop dates in addition to other pertinent events   5/10 ED to ED transfer from [facility] for ongoing back pain in setting of known T8-T9 osteo. Concern for progression 5/12 NSGY and ID consults. Abx changed to dapto + rocephin.  5/15 psych consult for passive SI 5/17 while he had been declining operative intervention, when further counseled on poor prognosis, then agreed to OR intervention 5/18 OR for T5-T11 fusion, T8-T9 corpectomy. Plan for ICU stay post op. PCCM consult. Had 1200 ebl, 250 cellsaver, 1 unit PRBCs. On low dose neo post-op. Moving LEs to command    Interim History / Subjective:  Aline came out last night otherwise only other issue is still he has been refusing meds still  Objective   Blood pressure 117/62, pulse (Abnormal) 102, temperature 98.1 F (36.7 C), temperature source Axillary, resp. rate 17, height  5\' 11"  (1.803 m), weight 125.9 kg, SpO2 95 %.        Intake/Output Summary (Last 24 hours) at 07/18/2021 0809 Last data filed at 07/18/2021 0400 Gross per 24 hour  Intake 6160.35 ml  Output 2335 ml  Net 3825.35 ml   Filed Weights   07/09/21 2200  Weight: 125.9 kg    Examination: General obese 61 year old male resting in bed no distress HENT NCAT no JVD MM ary dry  Pulm on room air. No accessory use. Dec bases Card slightly tachycardic. RRR  Thoracic drain w/ old bloody output Abd soft Ext trace edema warm good pulses Neuro awake and oriented. Strength equal bilateral lowers   Resolved Hospital Problem list   AKI Suicidal ideation   Assessment & Plan:  Principal Problem:   Osteomyelitis of thoracic region [facility]) Active Problems:   Essential hypertension   Thoracic discitis   Poor dentition   Nausea and vomiting   T8-T9 osteomyelitis, with kyphosis and stenosis, now status post T8, T9 corpectomy, bilateral pedicle screw placement of T5, T6, T8 7, T10. Plan Post op rx per neuro surg Dapto and ceftriaxone per ID Add IS  Mobilize as able   Postoperative blood loss Hgb stable over night Plan Trend cbc  Oral candidiasis Plan Nystatin   Intermittent fluid and electrolyte imbalance: mild NAG metabolic acidosis Plan Cont to trend  Dc NaCl; change to LR  GERD, Chronic nausea and vomiting See note from IM 5/18 has refused EGD Plan PPI Anti-emetics scheduled  Obesity (BMI 38) Plan Dietary consult  Poor dentition Plan F/u out-pt   Medical non-compliance w/ h/o suicidal ideation. Seen and cleared by psych Plan Supportive care May need to re-engage psych if becomes an issue again    Best Practice (right click and "Reselect all SmartList Selections" daily)   Diet/type: clear liquids DVT prophylaxis: SCD GI prophylaxis: PPI Lines: N/A Foley:  Yes, and it is still needed Code Status:  full code Last date of multidisciplinary goals of care discussion  [pending]   Critical care time: NA    Simonne Martinet ACNP-BC [facility] Pulmonary/Critical Care Pager # [phone] OR # [phone] if no answer

## 2021-07-18 NOTE — Progress Notes (Signed)
Occupational Therapy Re-Evaluation Patient Details Name: Michael PlumberJohn R [name] MRN: [id number] DOB: [birth year] Today's Date: 07/18/2021   History of Present Illness 61 y/o man presents to [facility] 07/09/2021 with multiple recent admissions of progressive discititis and osteomyelitis, now with worsening pain. CT revealed progressive osteomyelitis/discititis at T8-9 compared to recent studies with bony erosion and possible abscesses. Pt underwent T5-11 fusion with T8-9 corpectomy on 5/18. PMH includes HTN, obesity.   Clinical Impression   Pt premedicated before session however declined any mobility beyond attempting to roll. Re-evaluation after surgery as listed above. PTA pt apparently at modified independnet level with wife assisting with ADL tasks secondary to back pain.Requires max encouragement to participate @ bed level, however with continued encouragement pt able to partially roll to the L minimally and move BUE. Discussed need to increase HOB, have the pt feed himself and increase mobility.  If transitional movements inhibit mobility, may consider use of Tilt bed to progress mobility. Will further assess.      Recommendations for follow up therapy are one component of a multi-disciplinary discharge planning process, led by the attending physician.  Recommendations may be updated based on patient status, additional functional criteria and insurance authorization.   Follow Up Recommendations  Skilled nursing-short term rehab (<3 hours/day)    Assistance Recommended at Discharge Frequent or constant Supervision/Assistance  Patient can return home with the following Two people to help with walking and/or transfers;Two people to help with bathing/dressing/bathroom;Direct supervision/assist for medications management;Direct supervision/assist for financial management;Assist for transportation;Help with stairs or ramp for entrance    Functional Status Assessment  Patient has had a recent decline  in their functional status and demonstrates the ability to make significant improvements in function in a reasonable and predictable amount of time.  Equipment Recommendations       Recommendations for Other Services       Precautions / Restrictions Precautions Precautions: Fall;Back Precaution Booklet Issued: No Precaution Comments: verbally reviewed log roll technique Required Braces or Orthoses:  (no brace needed) Restrictions Weight Bearing Restrictions: No      Mobility Bed Mobility Overal bed mobility: Needs Assistance Bed Mobility: Rolling Rolling: Min assist (only ~50% roll wiht max encourage adn use of rails, therapist guiding movement) Sidelying to sit:  (pt refuses attempts at sitting upright)       General bed mobility comments: pt rolls ~50% to left side after gradual initiation of UE cross body movements and knee/hip flexion with pelvic rotation to each side for multiple trials    Transfers                   General transfer comment: pt declined      Balance                                           ADL either performed or assessed with clinical judgement   ADL     Eating/Feeding Details (indicate cue type and reason): wife has been feeding him since surgery; educated pt/wife on importance of him feeding himself; pt's repsonse was "I'll just starve" Grooming: Minimal assistance   Upper Body Bathing: Moderate assistance   Lower Body Bathing: Maximal assistance Lower Body Bathing Details (indicate cue type and reason): able to reach groin to clean after using urinal Upper Body Dressing : Maximal assistance;Bed level   Lower Body Dressing: Total assistance;Bed level   Toilet  Transfer:  (declined mobility)           Functional mobility during ADLs:  (declined) General ADL Comments: bed level only     Vision Baseline Vision/History: 1 Wears glasses       Perception     Praxis      Pertinent Vitals/Pain Pain  Assessment Pain Assessment: 0-10 Pain Score: 10-Worst pain ever Pain Location: back Pain Descriptors / Indicators: Aching Pain Intervention(s): Premedicated before session, Relaxation     Hand Dominance Left   Extremity/Trunk Assessment Upper Extremity Assessment Upper Extremity Assessment: Generalized weakness (ROM WFL; complains of scapular pain)   Lower Extremity Assessment Lower Extremity Assessment: Defer to PT evaluation   Cervical / Trunk Assessment Cervical / Trunk Assessment: Other exceptions;Back Surgery (increased body habitus)   Communication Communication Communication: No difficulties   Cognition Arousal/Alertness: Awake/alert Behavior During Therapy: Anxious Overall Cognitive Status: Within Functional Limits for tasks assessed                                 General Comments: requires max encouragemetn to lift his arms and attempt bed mobility     General Comments  VSS    Exercises Exercises: General Upper Extremity, General Lower Extremity General Exercises - Upper Extremity Shoulder Flexion: AAROM, Both, 10 reps Shoulder ABduction: AAROM, Both, 10 reps Elbow Flexion: AAROM, Both, 10 reps Elbow Extension: AAROM, Both, 10 reps Digit Composite Flexion: AROM, Both, 10 reps Other Exercises Other Exercises: "windshield wiper" movemetn with BLE without trunk rotation Other Exercises: place and hold BLE in bridge position   Shoulder Instructions      Home Living Family/patient expects to be discharged to:: Skilled nursing facility                                        Prior Functioning/Environment Prior Level of Function : Independent/Modified Independent;Working/employed;Driving             Mobility Comments: independent prior to onset of osteomyelitis; walking short distance at SNF with RW and sitting for 30 minutes ADLs Comments: prior to hospitalization, wife was assisting alot but due to self limitation         OT Problem List: Decreased activity tolerance;Obesity;Pain;Decreased range of motion;Decreased strength;Decreased safety awareness;Decreased knowledge of use of DME or AE;Decreased knowledge of precautions      OT Treatment/Interventions: Self-care/ADL training;DME and/or AE instruction;Therapeutic activities;Patient/family education;Balance training;Cognitive remediation/compensation;Therapeutic exercise    OT Goals(Current goals can be found in the care plan section) Acute Rehab OT Goals Patient Stated Goal: to not sit up OT Goal Formulation: With patient Time For Goal Achievement: 08/01/21 Potential to Achieve Goals: Fair  OT Frequency: Min 2X/week    Co-evaluation PT/OT/SLP Co-Evaluation/Treatment: Yes Reason for Co-Treatment: Complexity of the patient's impairments (multi-system involvement);Necessary to address cognition/behavior during functional activity;For patient/therapist safety;To address functional/ADL transfers PT goals addressed during session: Mobility/safety with mobility;Balance;Strengthening/ROM OT goals addressed during session: ADL's and self-care;Strengthening/ROM      AM-PAC OT "6 Clicks" Daily Activity     Outcome Measure Help from another person eating meals?: A Little Help from another person taking care of personal grooming?: A Lot Help from another person toileting, which includes using toliet, bedpan, or urinal?: A Lot Help from another person bathing (including washing, rinsing, drying)?: A Lot Help from another person to put on and taking off  regular upper body clothing?: A Lot Help from another person to put on and taking off regular lower body clothing?: Total 6 Click Score: 12   End of Session Nurse Communication: Mobility status;Other (comment) (patients participation)  Activity Tolerance: Patient limited by pain (limits self) Patient left: in chair;with call bell/phone within reach;with family/visitor present  OT Visit Diagnosis:  Unsteadiness on feet (R26.81);Other abnormalities of gait and mobility (R26.89);Muscle weakness (generalized) (M62.81);Pain Pain - part of body:  (back)                Time: 8527-7824 OT Time Calculation (min): 28 min Charges:  OT General Charges $OT Visit: 1 Visit OT Evaluation $OT Re-eval: 1 Re-eval  Luisa Dago, OT/L   Acute OT Clinical Specialist Acute Rehabilitation Services Pager [phone] Office [phone]   [facility] 07/18/2021, 2:16 PM

## 2021-07-19 DIAGNOSIS — M8620 Subacute osteomyelitis, unspecified site: Secondary | ICD-10-CM

## 2021-07-19 DIAGNOSIS — M4624 Osteomyelitis of vertebra, thoracic region: Secondary | ICD-10-CM | POA: Diagnosis not present

## 2021-07-19 DIAGNOSIS — D696 Thrombocytopenia, unspecified: Secondary | ICD-10-CM

## 2021-07-19 DIAGNOSIS — R112 Nausea with vomiting, unspecified: Secondary | ICD-10-CM | POA: Diagnosis not present

## 2021-07-19 DIAGNOSIS — D62 Acute posthemorrhagic anemia: Secondary | ICD-10-CM | POA: Diagnosis not present

## 2021-07-19 LAB — GLUCOSE, CAPILLARY
Glucose-Capillary: 100 mg/dL — ABNORMAL HIGH (ref 70–99)
Glucose-Capillary: 122 mg/dL — ABNORMAL HIGH (ref 70–99)
Glucose-Capillary: 131 mg/dL — ABNORMAL HIGH (ref 70–99)
Glucose-Capillary: 78 mg/dL (ref 70–99)
Glucose-Capillary: 112 mg/dL — ABNORMAL HIGH (ref 70–99)
Glucose-Capillary: 119 mg/dL — ABNORMAL HIGH (ref 70–99)

## 2021-07-19 LAB — COMPREHENSIVE METABOLIC PANEL
ALT: 17 U/L (ref 0–44)
AST: 23 U/L (ref 15–41)
Albumin: 3 g/dL — ABNORMAL LOW (ref 3.5–5.0)
Alkaline Phosphatase: 61 U/L (ref 38–126)
Anion gap: 11 (ref 5–15)
BUN: 8 mg/dL (ref 6–20)
CO2: 20 mmol/L — ABNORMAL LOW (ref 22–32)
Calcium: 8.8 mg/dL — ABNORMAL LOW (ref 8.9–10.3)
Chloride: 108 mmol/L (ref 98–111)
Creatinine, Ser: 0.97 mg/dL (ref 0.61–1.24)
GFR, Estimated: >60 mL/min (ref >60)
Glucose, Bld: 109 mg/dL — ABNORMAL HIGH (ref 70–99)
Potassium: 4 mmol/L (ref 3.5–5.1)
Sodium: 139 mmol/L (ref 135–145)
Total Bilirubin: 0.7 mg/dL (ref 0.3–1.2)
Total Protein: 5.5 g/dL — ABNORMAL LOW (ref 6.5–8.1)

## 2021-07-19 LAB — CBC WITH DIFFERENTIAL/PLATELET
Abs Immature Granulocytes: 0.4 10*3/uL — ABNORMAL HIGH (ref 0.00–0.07)
Basophils Absolute: 0.1 10*3/uL (ref 0.0–0.1)
Basophils Relative: 1 %
Eosinophils Absolute: 0.1 10*3/uL (ref 0.0–0.5)
Eosinophils Relative: 1 %
HCT: 31.6 % — ABNORMAL LOW (ref 39.0–52.0)
Hemoglobin: 9.7 g/dL — ABNORMAL LOW (ref 13.0–17.0)
Immature Granulocytes: 4 %
Lymphocytes Relative: 17 %
Lymphs Abs: 1.9 10*3/uL (ref 0.7–4.0)
MCH: 30.5 pg (ref 26.0–34.0)
MCHC: 30.7 g/dL (ref 30.0–36.0)
MCV: 99.4 fL (ref 80.0–100.0)
Monocytes Absolute: 1.6 10*3/uL — ABNORMAL HIGH (ref 0.1–1.0)
Monocytes Relative: 14 %
Neutro Abs: 7.2 10*3/uL (ref 1.7–7.7)
Neutrophils Relative %: 63 %
Platelets: 144 10*3/uL — ABNORMAL LOW (ref 150–400)
RBC: 3.18 MIL/uL — ABNORMAL LOW (ref 4.22–5.81)
RDW: 18.2 % — ABNORMAL HIGH (ref 11.5–15.5)
WBC: 11.3 10*3/uL — ABNORMAL HIGH (ref 4.0–10.5)
nRBC: 0 % (ref 0.0–0.2)

## 2021-07-19 MED ORDER — DIPHENHYDRAMINE HCL 12.5 MG/5ML PO ELIX
25.0000 mg | ORAL_SOLUTION | Freq: Four times a day (QID) | ORAL | Status: DC | PRN
  Filled 2021-07-19: qty 10

## 2021-07-19 MED ORDER — ACETAMINOPHEN 500 MG PO TABS
1000.0000 mg | ORAL_TABLET | Freq: Four times a day (QID) | ORAL | Status: DC
  Administered 2021-07-19 – 2021-07-20 (×3): 1000 mg via ORAL
  Administered 2021-07-21: 500 mg via ORAL
  Administered 2021-07-21 – 2021-07-22 (×2): 1000 mg via ORAL
  Filled 2021-07-19 (×11): qty 2

## 2021-07-19 MED ORDER — GABAPENTIN 300 MG PO CAPS
300.0000 mg | ORAL_CAPSULE | Freq: Three times a day (TID) | ORAL | Status: DC
  Administered 2021-07-19 – 2021-07-22 (×2): 300 mg via ORAL
  Filled 2021-07-19 (×10): qty 1

## 2021-07-19 NOTE — Progress Notes (Signed)
Patient ID: Michael [name], male   DOB: [birth year], 61 y.o.   MRN: [id number] BP (!) 130/95   Pulse (!) 103   Temp 98.1 F (36.7 C) (Oral)   Resp 16   Ht 5\' 11"  (1.803 m)   Wt 125.9 kg   SpO2 97%   BMI 38.71 kg/m  Alert and oriented x 4 Moving all extremities Reluctant to get out of bed, "cannot wrap my head around sitting up in bed" He will be out of bed tomorrow Wound is clean, dry, no signs of infection

## 2021-07-19 NOTE — Progress Notes (Signed)
Id brief note  S/p t5-11 fusion, t809 corpectomy 5/18  5/18 operative cx ngtd 5/19 ck 353   Abx  Vanc-- dapto Piptazo-- ceftriaxone    I think by Monday if cx remains without mrsa reasonable to consider stopping dapto. Suspect process all related to previous ecoli Dr Thedore Mins or Earlene Plater to resume care Monday and would leave abx decision to oncoming team

## 2021-07-19 NOTE — Progress Notes (Signed)
Physical Therapy Treatment Patient Details Name: Michael [name] MRN: [id number] DOB: [birth year] Today's Date: 07/19/2021   History of Present Illness 61 y/o man presents to [facility] hospital 07/09/2021 with multiple recent admissions of progressive discititis and osteomyelitis, now with worsening pain. CT revealed progressive osteomyelitis/discititis at T8-9 compared to recent studies with bony erosion and possible abscesses. Pt underwent T5-11 fusion with T8-9 corpectomy on 5/18. PMH includes HTN, obesity.    PT Comments    Pt demonstrates improved tolerance for bed mobility, although continuing to require max encouragement. Pt is more easily able to roll, requiring reduced assistance. Pt progresses to sitting at edge of bed this session but adamantly refuses attempts at standing or attempting to clear buttocks from bed. PT continues to provide max encouragement for mobility progression in  an effort to reduce pain and restore independence. PT continues to recommend SNF placement at this time.    Recommendations for follow up therapy are one component of a multi-disciplinary discharge planning process, led by the attending physician.  Recommendations may be updated based on patient status, additional functional criteria and insurance authorization.  Follow Up Recommendations  Skilled nursing-short term rehab (<3 hours/day)     Assistance Recommended at Discharge Intermittent Supervision/Assistance  Patient can return home with the following Two people to help with walking and/or transfers;Two people to help with bathing/dressing/bathroom;Assistance with cooking/housework;Assist for transportation;Help with stairs or ramp for entrance   Equipment Recommendations  Wheelchair (measurements PT);Wheelchair cushion (measurements PT);Hospital bed    Recommendations for Other Services       Precautions / Restrictions Precautions Precautions: Fall;Back Precaution Booklet Issued: No Precaution  Comments: verbally reviewed log roll technique Restrictions Weight Bearing Restrictions: No     Mobility  Bed Mobility Overal bed mobility: Needs Assistance Bed Mobility: Rolling, Sidelying to Sit, Sit to Sidelying Rolling: Min assist, Min guard Sidelying to sit: Mod assist     Sit to sidelying: Mod assist General bed mobility comments: assist to elevate trunk into sitting and assist to elevate legs back onto bed. Verbal cues for technique    Transfers Overall transfer level:  (pt refuses attempts at standing despite max encouragement)                      Ambulation/Gait                   Stairs             Wheelchair Mobility    Modified Rankin (Stroke Patients Only)       Balance Overall balance assessment: Needs assistance Sitting-balance support: No upper extremity supported, Feet supported Sitting balance-Leahy Scale: Fair                                      Cognition Arousal/Alertness: Awake/alert Behavior During Therapy: Anxious Overall Cognitive Status: Within Functional Limits for tasks assessed                                 General Comments: self-limiting, requires max encouragement, spouse present and supportive        Exercises      General Comments General comments (skin integrity, edema, etc.): VSS on RA      Pertinent Vitals/Pain Pain Assessment Pain Assessment: 0-10 Pain Score: 8  Pain Location: back Pain Descriptors / Indicators: Aching  Pain Intervention(s): Monitored during session    Home Living                          Prior Function            PT Goals (current goals can now be found in the care plan section) Acute Rehab PT Goals Patient Stated Goal: feel better Progress towards PT goals: Progressing toward goals    Frequency    Min 3X/week      PT Plan Current plan remains appropriate    Co-evaluation              AM-PAC PT "6 Clicks"  Mobility   Outcome Measure  Help needed turning from your back to your side while in a flat bed without using bedrails?: A Little Help needed moving from lying on your back to sitting on the side of a flat bed without using bedrails?: A Lot Help needed moving to and from a bed to a chair (including a wheelchair)?: Total Help needed standing up from a chair using your arms (e.g., wheelchair or bedside chair)?: Total Help needed to walk in hospital room?: Total Help needed climbing 3-5 steps with a railing? : Total 6 Click Score: 9    End of Session   Activity Tolerance: Other (comment);Patient limited by pain (self-limiting, requires max encouragement) Patient left: in bed;with call bell/phone within reach;with bed alarm set;with family/visitor present Nurse Communication: Mobility status PT Visit Diagnosis: Pain;Difficulty in walking, not elsewhere classified (R26.2) Pain - part of body:  (back)     Time: [id number] PT Time Calculation (min) (ACUTE ONLY): 31 min  Charges:  $Therapeutic Activity: 23-37 mins                     Zenaida Niece, PT, DPT Acute Rehabilitation Pager: [phone] Office [place] 07/19/2021, 5:45 PM

## 2021-07-19 NOTE — Progress Notes (Signed)
NAME:  Michael [name], MRN:  [id number], DOB:  26-Jul-1960, LOS: 10 ADMISSION DATE:  07/09/2021, CONSULTATION DATE:  07/17/21 REFERRING MD:  Elgergawy - TRH, CHIEF COMPLAINT:  osteo thoracic spine    History of Present Illness:   History obtained from chart review   61 yo M PMH T8-9 discitis/osteomyelitis with associated e coli bacteremia requiring course of rocephin, cefadroxyl but has had ongoing pain since. Ultimately presented to [facility] for pain 5/10 and obtained a CT again showinng T8-T9 osteomyelitis, discitis -- now with evidence of bony erosion and progression since prior. Transferred to [facility] for NSGY evaluation, admitted to [facility]. Started on vanc, zosyn. ID consulted as well as NSGY -- abx changed to dapto rocephin. Seen by psych for passive SI.   Ultimately 5/18 he went for T5-T11 fusion, T8-T9 corpectomy 5/18 with NSGY and is planned for ICU stay post op.   PCCM consulted in this setting   Pertinent  Medical History  Thoracic osteomyelitis HTN Medication noncompliance  Ecoli bacteremia   Significant Hospital Events: Including procedures, antibiotic start and stop dates in addition to other pertinent events   5/10 ED to ED transfer from [facility] for ongoing back pain in setting of known T8-T9 osteo. Concern for progression 5/12 NSGY and ID consults. Abx changed to dapto + rocephin.  5/15 psych consult for passive SI 5/17 while he had been declining operative intervention, when further counseled on poor prognosis, then agreed to OR intervention 5/18 OR for T5-T11 fusion, T8-T9 corpectomy. Plan for ICU stay post op. PCCM consult. Had 1200 ebl, 250 cellsaver, 1 unit PRBCs. On low dose neo post-op. Moving LEs to command    Interim History / Subjective:  Today he complains of worse pain.  Refused bowel regimen and therapy yesterday.  Reports he is trying to stay off opiates.  Objective   Blood pressure 132/88, pulse (!) 101, temperature 98.2 F (36.8 C), temperature source Oral,  resp. rate 18, height 5\' 11"  (1.803 m), weight 125.9 kg, SpO2 93 %.        Intake/Output Summary (Last 24 hours) at 07/19/2021 [id number] Last data filed at 07/19/2021 0600 Gross per 24 hour  Intake 2132.18 ml  Output 1345 ml  Net 787.18 ml    Filed Weights   07/09/21 2200  Weight: 125.9 kg    Examination: General chronically ill-appearing man lying in bed in no acute distress HENT [place]/AT, eyes anicteric Pulm breathing comfortably on room air, decreased basilar breath sounds, clear to auscultation bilaterally anteriorly Card S1-S2, regular rhythm, mild tachycardia Derm: Operative wound not examined Abd obese, soft, nontender Ext mild peripheral edema, no cyanosis Neuro awake and alert, moving extremities, normal speech  Operative cultures> NGTD  Resolved Hospital Problem list   AKI Suicidal ideation   Assessment & Plan:  Principal Problem:   Osteomyelitis of thoracic region [facility]) Active Problems:   Essential hypertension   Thoracic discitis   Poor dentition   Nausea and vomiting   T8-T9 osteomyelitis with paraspinal muscle abscesses, with kyphosis and stenosis, now status post T8, T9 corpectomy, bilateral pedicle screw placement of T5, T6, T8 7, T10. -Encouraged him to use pain medications as needed and stressed the importance of physical therapy.  Robaxin, oxycodone, morphine as needed - Stressed the importance of a bowel regimen again - Daptomycin and ceftriaxone per ID -Wound management per neurosurgery - Incentive spirometry, out of bed mobility -Heparin for DVT prophylaxis  Acute blood loss anemia due to operative blood loss - Transfuse for  hemoglobin less than 7 or hemodynamically significant bleeding - Continue to monitor  Mild thrombocytopenia, likely consumptive - Monitor, no indication for transfusion  Oral candidiasis -Continue nystatin  Mild hyperbilirubinemia - Monitor  GERD, chronic nausea and vomiting See note from IM 5/18 has refused  EGD -Scheduled Zofran - Zofran and Compazine as needed - PPI  Prediabetes, A1c 6.1 - Sliding scale insulin as needed - Goal blood glucose less than 180 -Needs outpatient follow-up  Obesity (BMI 38) -Recommend modest weight loss as a long-term goal  Poor dentition -Needs outpatient dental follow-up  Medical non-compliance w/ h/o suicidal ideation. Seen and cleared by psych during this admission -Continue encouragement and supportive care May need to re-engage psych if becomes an issue again    Likely stable to transfer out of the ICU today.  Will defer to neurosurgery.  Best Practice (right click and "Reselect all SmartList Selections" daily)   Diet/type: dysphagia diet (see orders) DVT prophylaxis: prophylactic heparin  GI prophylaxis: PPI Lines: N/A Foley:  Yes, and it is still needed Code Status:  full code Last date of multidisciplinary goals of care discussion [pending]   Critical care time: NA    Julian Hy, DO 07/19/21 9:01 AM [facility]

## 2021-07-20 DIAGNOSIS — R739 Hyperglycemia, unspecified: Secondary | ICD-10-CM | POA: Diagnosis not present

## 2021-07-20 DIAGNOSIS — R110 Nausea: Secondary | ICD-10-CM

## 2021-07-20 DIAGNOSIS — M4624 Osteomyelitis of vertebra, thoracic region: Secondary | ICD-10-CM | POA: Diagnosis not present

## 2021-07-20 LAB — CBC WITH DIFFERENTIAL/PLATELET
Abs Immature Granulocytes: 0.1 10*3/uL — ABNORMAL HIGH (ref 0.00–0.07)
Basophils Absolute: 0.2 10*3/uL — ABNORMAL HIGH (ref 0.0–0.1)
Basophils Relative: 2 %
Eosinophils Absolute: 0 10*3/uL (ref 0.0–0.5)
Eosinophils Relative: 0 %
HCT: 26.8 % — ABNORMAL LOW (ref 39.0–52.0)
Hemoglobin: 8.6 g/dL — ABNORMAL LOW (ref 13.0–17.0)
Lymphocytes Relative: 33 %
Lymphs Abs: 3.4 10*3/uL (ref 0.7–4.0)
MCH: 30.5 pg (ref 26.0–34.0)
MCHC: 32.1 g/dL (ref 30.0–36.0)
MCV: 95 fL (ref 80.0–100.0)
Metamyelocytes Relative: 1 %
Monocytes Absolute: 0.6 10*3/uL (ref 0.1–1.0)
Monocytes Relative: 6 %
Neutro Abs: 6 10*3/uL (ref 1.7–7.7)
Neutrophils Relative %: 58 %
Platelets: 181 10*3/uL (ref 150–400)
RBC: 2.82 MIL/uL — ABNORMAL LOW (ref 4.22–5.81)
RDW: 17.9 % — ABNORMAL HIGH (ref 11.5–15.5)
WBC: 10.4 10*3/uL (ref 4.0–10.5)
nRBC: 0 % (ref 0.0–0.2)

## 2021-07-20 LAB — BPAM RBC
Blood Product Expiration Date: 202306142359
Blood Product Expiration Date: 202306142359
ISSUE DATE / TIME: 202305181434
ISSUE DATE / TIME: 202305181434
Unit Type and Rh: 5100
Unit Type and Rh: 5100

## 2021-07-20 LAB — TYPE AND SCREEN
ABO/RH(D): O POS
Antibody Screen: NEGATIVE
Unit division: 0
Unit division: 0

## 2021-07-20 LAB — GLUCOSE, CAPILLARY
Glucose-Capillary: 100 mg/dL — ABNORMAL HIGH (ref 70–99)
Glucose-Capillary: 126 mg/dL — ABNORMAL HIGH (ref 70–99)
Glucose-Capillary: 104 mg/dL — ABNORMAL HIGH (ref 70–99)
Glucose-Capillary: 138 mg/dL — ABNORMAL HIGH (ref 70–99)
Glucose-Capillary: 84 mg/dL (ref 70–99)

## 2021-07-20 LAB — CK: Total CK: 184 U/L (ref 49–397)

## 2021-07-20 NOTE — Progress Notes (Signed)
NAME:  Michael [name], MRN:  [id number], DOB:  Dec 30, 1960, LOS: 11 ADMISSION DATE:  07/09/2021, CONSULTATION DATE:  07/17/21 REFERRING MD:  Elgergawy - TRH, CHIEF COMPLAINT:  osteo thoracic spine    History of Present Illness:   History obtained from chart review   61 yo M PMH T8-9 discitis/osteomyelitis with associated e coli bacteremia requiring course of rocephin, cefadroxyl but has had ongoing pain since. Ultimately presented to [facility] for pain 5/10 and obtained a CT again showinng T8-T9 osteomyelitis, discitis -- now with evidence of bony erosion and progression since prior. Transferred to [facility] for NSGY evaluation, admitted to [facility]. Started on vanc, zosyn. ID consulted as well as NSGY -- abx changed to dapto rocephin. Seen by psych for passive SI.   Ultimately 5/18 he went for T5-T11 fusion, T8-T9 corpectomy 5/18 with NSGY and is planned for ICU stay post op.   PCCM consulted in this setting   Pertinent  Medical History  Thoracic osteomyelitis HTN Medication noncompliance  Ecoli bacteremia   Significant Hospital Events: Including procedures, antibiotic start and stop dates in addition to other pertinent events   5/10 ED to ED transfer from [facility] for ongoing back pain in setting of known T8-T9 osteo. Concern for progression 5/12 NSGY and ID consults. Abx changed to dapto + rocephin.  5/15 psych consult for passive SI 5/17 while he had been declining operative intervention, when further counseled on poor prognosis, then agreed to OR intervention 5/18 OR for T5-T11 fusion, T8-T9 corpectomy. Plan for ICU stay post op. PCCM consult. Had 1200 ebl, 250 cellsaver, 1 unit PRBCs. On low dose neo post-op. Moving LEs to command    Interim History / Subjective:  Worked with PT yesterday and did   Objective   Blood pressure (!) 84/61, pulse (!) 105, temperature 98.2 F (36.8 C), temperature source Oral, resp. rate 14, height 5\' 11"  (1.803 m), weight 125.9 kg, SpO2 97 %.         Intake/Output Summary (Last 24 hours) at 07/20/2021 0724 Last data filed at 07/20/2021 0700 Gross per 24 hour  Intake 367.42 ml  Output 1150 ml  Net -782.58 ml    Filed Weights   07/09/21 2200  Weight: 125.9 kg    Examination: General chronically ill appearing man sitting up in bed in NAD HENT [place]/AT, eyes anicteric Pulm braething comfortably on RA, CTAB Card S1S2, RRR Derm: warm, dry. Incision not examined. Abd obese, soft, NT Ext no significant peripheral edema, no cyanosis Neuro awake, alert, moving all extremities minimally. Answering questions appropriately Psych: flattened affect  Operative cultures 5/18>  NGTD H/H 8.6/26.8  Resolved Hospital Problem list   AKI Suicidal ideation   Assessment & Plan:  Principal Problem:   Osteomyelitis of thoracic region [facility]) Active Problems:   Essential hypertension   Thoracic discitis   Poor dentition   Nausea and vomiting   T8-T9 osteomyelitis with paraspinal muscle abscesses, with kyphosis and stenosis, now status post T8, T9 corpectomy, bilateral pedicle screw placement of T5, T6, T8 7, T10. -Pain control PRN; I have stressed the importance of participating with PT to regain his functional status.  -bowel regimen while on opaites -dapto and ceftriaxone per ID; monitor CK periodically -wound care; surgical drain removed today  -IS, OOB mobility, DVT prophylaxis -high risk for post-op complications with limited mobility  Acute blood loss anemia due to operative blood loss - monitor for ongoing bleeding -transfuse for Hb <7 or hemodynamically significant bleeding - Continue to monitor  Mild thrombocytopenia, likely consumptive> resolved - monitor  Oral candidiasis --completed course or topical nystatin  Mild hyperbilirubinemia - Monitor periodically  GERD, chronic nausea and vomiting See note from IM 5/18 has refused EGD -Scheduled Zofran Q8h - zofran and compazine PRN - con't daily PPI  Prediabetes, A1c  6.1 - SSI PRN-minimal insulin requirements so far - goal BG <180 -Needs outpatient follow-up with PCP  Obesity (BMI 38) -Recommend modest weight loss as a long-term goal  Poor dentition -Needs outpatient dental follow-up  Medical non-compliance w/ h/o suicidal ideation. Seen and cleared by psych during this admission -Ongoing encouragement and supportive care. His mother is at bedside today and has been helpful with medication compliance in the ICU. He may need to re-engage psych if becomes an issue again.   Mr. Haspel and his mother were updated at bedside. Stable to trasnfer back to med tele floor. TRH to reassume care tomorrow. D/w Dr. Christella Noa.  Best Practice (right click and "Reselect all SmartList Selections" daily)   Diet/type: dysphagia diet (see orders) DVT prophylaxis: prophylactic heparin  GI prophylaxis: PPI Lines: N/A Foley:  N/A Code Status:  full code Last date of multidisciplinary goals of care discussion [pending]   Critical care time: NA    Julian Hy, DO 07/20/21 5:43 PM [facility]

## 2021-07-20 NOTE — Progress Notes (Signed)
Patient ID: Michael [name], male   DOB: 04-Mar-1960, 61 y.o.   MRN: [id number] BP 139/87 (BP Location: Right Wrist)   Pulse (!) 102   Temp 98.3 F (36.8 C) (Oral)   Resp (!) 22   Ht 5\' 11"  (1.803 m)   Wt 125.9 kg   SpO2 95%   BMI 38.71 kg/m  Alert and oriented x 4, speech is clear and fluent Moving all extremities Drain removed Highly reluctant to engage in any activity

## 2021-07-21 DIAGNOSIS — M4624 Osteomyelitis of vertebra, thoracic region: Secondary | ICD-10-CM | POA: Diagnosis not present

## 2021-07-21 LAB — COMPREHENSIVE METABOLIC PANEL
ALT: 18 U/L (ref 0–44)
AST: 19 U/L (ref 15–41)
Albumin: 2.7 g/dL — ABNORMAL LOW (ref 3.5–5.0)
Alkaline Phosphatase: 65 U/L (ref 38–126)
Anion gap: 8 (ref 5–15)
BUN: 6 mg/dL (ref 6–20)
CO2: 24 mmol/L (ref 22–32)
Calcium: 8.4 mg/dL — ABNORMAL LOW (ref 8.9–10.3)
Chloride: 108 mmol/L (ref 98–111)
Creatinine, Ser: 0.85 mg/dL (ref 0.61–1.24)
GFR, Estimated: >60 mL/min (ref >60)
Glucose, Bld: 95 mg/dL (ref 70–99)
Potassium: 3.3 mmol/L — ABNORMAL LOW (ref 3.5–5.1)
Sodium: 140 mmol/L (ref 135–145)
Total Bilirubin: 0.8 mg/dL (ref 0.3–1.2)
Total Protein: 5.3 g/dL — ABNORMAL LOW (ref 6.5–8.1)

## 2021-07-21 LAB — GLUCOSE, CAPILLARY
Glucose-Capillary: 103 mg/dL — ABNORMAL HIGH (ref 70–99)
Glucose-Capillary: 150 mg/dL — ABNORMAL HIGH (ref 70–99)
Glucose-Capillary: 119 mg/dL — ABNORMAL HIGH (ref 70–99)
Glucose-Capillary: 130 mg/dL — ABNORMAL HIGH (ref 70–99)
Glucose-Capillary: 110 mg/dL — ABNORMAL HIGH (ref 70–99)
Glucose-Capillary: 105 mg/dL — ABNORMAL HIGH (ref 70–99)

## 2021-07-21 LAB — CBC
HCT: 27 % — ABNORMAL LOW (ref 39.0–52.0)
Hemoglobin: 8.8 g/dL — ABNORMAL LOW (ref 13.0–17.0)
MCH: 30.7 pg (ref 26.0–34.0)
MCHC: 32.6 g/dL (ref 30.0–36.0)
MCV: 94.1 fL (ref 80.0–100.0)
Platelets: 204 10*3/uL (ref 150–400)
RBC: 2.87 MIL/uL — ABNORMAL LOW (ref 4.22–5.81)
RDW: 18 % — ABNORMAL HIGH (ref 11.5–15.5)
WBC: 10.9 10*3/uL — ABNORMAL HIGH (ref 4.0–10.5)
nRBC: 0 % (ref 0.0–0.2)

## 2021-07-21 MED ORDER — POTASSIUM CHLORIDE CRYS ER 20 MEQ PO TBCR
20.0000 meq | EXTENDED_RELEASE_TABLET | ORAL | Status: AC
  Filled 2021-07-21: qty 1

## 2021-07-21 MED ORDER — POTASSIUM CHLORIDE 10 MEQ/100ML IV SOLN
10.0000 meq | INTRAVENOUS | Status: AC
  Administered 2021-07-21 (×4): 10 meq via INTRAVENOUS
  Filled 2021-07-21 (×4): qty 100

## 2021-07-21 MED ORDER — HEPARIN SODIUM (PORCINE) 5000 UNIT/ML IJ SOLN
5000.0000 [IU] | Freq: Three times a day (TID) | INTRAMUSCULAR | Status: DC
  Administered 2021-07-21 – 2021-07-31 (×29): 5000 [IU] via SUBCUTANEOUS
  Filled 2021-07-21 (×30): qty 1

## 2021-07-21 NOTE — Progress Notes (Signed)
Subjective: Patient reports that overall he feels that his back pain is improving. He continues to be reluctant to increase mobility despite significant encouragement and medical justification. No acute events overnight.  Objective: Vital signs in last 24 hours: Temp:  [97.7 F (36.5 C)-98.3 F (36.8 C)] 98 F (36.7 C) (05/22 0723) Pulse Rate:  [89-102] 100 (05/22 0723) Resp:  [14-22] 20 (05/22 0723) BP: (113-139)/(78-87) 127/82 (05/22 0723) SpO2:  [94 %-100 %] 99 % (05/22 0723)  Intake/Output from previous day: 05/21 0701 - 05/22 0700 In: 607.9 [I.V.:536.2; IV Piggyback:71.7] Out: 550 [Urine:550] Intake/Output this shift: No intake/output data recorded.  Physical Exam: Patient is awake, A/O X 4, and conversant. He has a flat affect. Eyes open spontaneously. They are in NAD. Speech is fluent and appropriate. MAEW with good strength that is symmetric bilaterally. BUE 4+/5 throughout, BLE 4+/5 throughout. No clonus appreciated in BLE. No pathological reflexes. Sensation to light touch is intact. PERLA, EOMI. CNs grossly intact. Dressing is clean dry intact. Incision is well approximated with no drainage, erythema, or edema.   Lab Results: Recent Labs    07/20/21 0238 07/21/21 0152  WBC 10.4 10.9*  HGB 8.6* 8.8*  HCT 26.8* 27.0*  PLT 181 204   BMET Recent Labs    07/19/21 0818 07/21/21 0152  NA 139 140  K 4.0 3.3*  CL 108 108  CO2 20* 24  GLUCOSE 109* 95  BUN 8 6  CREATININE 0.97 0.85  CALCIUM 8.8* 8.4*    Studies/Results: No results found.  Assessment/Plan: Patient is s/p T5-11 fusion with T8-9 corpectomy due to osteodiscitis with kyphosis and stenosis on 07/17/2021. He is continuing to recover well and reports that his back pain is slowly improving. His neurological examination is unchanged and is stable. He has been resistant to mobility despite encouragement form NSX, nursing staff, and therapies. We discussed at length this morning about the need to mobilize and  potential complications that could arise with immobility. I encouraged him to work on movement while in bed and to set small achievable daily goals. Goal for today will be to sit up on the side of the bed multiple times. Tomorrow he will sit in his bedside chair. I encouraged the patient to work with his nurse and therapy team to ensure his pain is addressed prior to working with therapies.  I also encouraged the patient's RN to provide the patient with mobility opportunities. PT/OT recommending SNF for latter convalescence.     -TLSO when OOB -PT/OT  -Pain control -Encourage mobility   LOS: 12 days          Council Mechanic, DNP, AGNP-C Neurosurgery Nurse Practitioner  [facility] [address], [address], [place], [place] [address] P: [phone]    F: [phone]  07/21/2021 8:19 AM

## 2021-07-21 NOTE — Progress Notes (Signed)
PROGRESS NOTE  Michael [name]  GNF:[id number] DOB: February 18, 1961 DOA: 07/09/2021 PCP: Michael NewcomerBoyd, Michael S, PA   Brief Narrative: Patient is a 61 year old male with history of T8-9 discitis/osteomyelitis, E. coli bacteremia [facility] with complaint of pain.  CT imaging again showed T8-T9 osteomyelitis, discitis, now with evidence of bony erosion, progression since prior.  [facility]  Patient was started on broad-spectrum antibiotics, ID consulted.  Patient underwent T5-T11 fusion, T8-T9 corpectomy by neurosurgery and was transferred to ICU for postop stay.  Hospital course remarkable for poor mobility.  PT/OT recommending skilled nursing facility on discharge  Assessment & Plan:  Principal Problem:   Osteomyelitis of thoracic region [facility](HCC) Active Problems:   Thoracic discitis   Poor dentition   Nausea and vomiting   Essential hypertension  Thoracic vertebral osteomyelitis:History of T8-9 discitis/osteomyelitis, E. coli bacteremia [facility] with complaint of pain.  CT imaging again showed T8-T9 osteomyelitis, discitis, now with evidence of bony erosion, progression since prior.Patient was started on broad-spectrum antibiotics, ID consulted.  Patient underwent T5-T11 fusion, T8-T9 corpectomy by neurosurgery.  Currently on daptomycin and ceftriaxone.  Anticipate further modification from ID regarding antibiotics.  Blood cultures no growth till date Continue pain management, supportive care.  Acute blood loss anemia: Currently hemoglobin stable in the range of 8.  He was transfused with a unit of PRBC here during this hospitalization..  Medical noncompliance/suicidal ideation: Seen by psych during this hospitalization, cleared, no need of inpatient psychiatric admission  Prediabetes: Hemoglobin A1c of 6.1.  Currently on sliding scale insulin  Oral candidiasis: On  nystatin  Hypertension: Currently blood pressure soft, only on as needed medications for now.  AKI: Resolved  GERD/chronic nausea/vomiting: Continue daily PPI.  He was seen by gastroenterology on April when he declined EGD.  Debility/deconditioning: Patient seen by PT/OT and recommended skilled nursing discharge.  Poor mobility.  Needs lots of encouragement  Hypokalemia: Supplemented with potassium.  Obesity: BMI of 38   Nutrition Problem: Increased nutrient needs Etiology: post-op healing    DVT prophylaxis:heparin injection 5,000 Units Start: 07/18/21 2200 Place and maintain sequential compression device Start: 07/17/21 1833 SCD'[name] Start: 07/17/21 1738     Code Status: Full Code  Family Communication: None at bedside  Patient status:Inpatient  Patient is from :Home  Anticipated discharge to:SNF  Estimated DC date:Not sure yet, waiting for final recommendation from ID regarding antibiotics   Consultants: ID, neurosurgery  Procedures: Vertebral fusion  Antimicrobials:  Anti-infectives (From admission, onward)    Start     Dose/Rate Route Frequency Ordered Stop   07/17/21 1830  ceFAZolin (ANCEF) IVPB 2g/100 mL premix  Status:  Discontinued        2 g 200 mL/hr over 30 Minutes Intravenous Every 8 hours 07/17/21 1737 07/17/21 1744   07/17/21 1745  cefTRIAXone (ROCEPHIN) 2 g in sodium chloride 0.9 % 100 mL IVPB        2 g 200 mL/hr over 30 Minutes Intravenous Every 24 hours 07/17/21 1745     07/16/21 2234  ceFAZolin (ANCEF) IVPB 3g/100 mL premix        3 g 200 mL/hr over 30 Minutes Intravenous 30 min pre-op 07/16/21 2234 07/17/21 1226   07/11/21 1700  vancomycin (VANCOCIN) IVPB 1000 mg/200 mL premix  Status:  Discontinued        1,000 mg 200 mL/hr over 60 Minutes Intravenous Every 12 hours 07/11/21 0923 07/11/21 1257  07/11/21 1345  DAPTOmycin (CUBICIN) 750 mg in sodium chloride 0.9 % IVPB        8 mg/kg  95.5 kg (Adjusted) 130 mL/hr over 30 Minutes Intravenous  Daily 07/11/21 1257     07/11/21 1345  cefTRIAXone (ROCEPHIN) 2 g in sodium chloride 0.9 % 100 mL IVPB  Status:  Discontinued        2 g 200 mL/hr over 30 Minutes Intravenous Every 24 hours 07/11/21 1257 07/17/21 1745   07/10/21 1400  vancomycin (VANCOREADY) IVPB 1250 mg/250 mL  Status:  Discontinued        1,250 mg 166.7 mL/hr over 90 Minutes Intravenous Every 12 hours 07/09/21 2249 07/11/21 0923   07/09/21 2230  piperacillin-tazobactam (ZOSYN) IVPB 3.375 g  Status:  Discontinued        3.375 g 12.5 mL/hr over 240 Minutes Intravenous Every 8 hours 07/09/21 2137 07/11/21 1257       Subjective: Patient seen and examined the bedside this morning.  Hemodynamically stable.  Comfortable, lying on bed.  Not in any current distress.  Denies any new complaints.  Discussed about importance of mobility  Objective: Vitals:   07/20/21 2009 07/20/21 2313 07/21/21 0317 07/21/21 0723  BP: 113/78 125/85 128/86 127/82  Pulse: 93 97 93 100  Resp: 18 14 15 20   Temp: 97.8 F (36.6 C) 98.3 F (36.8 C) 97.7 F (36.5 C) 98 F (36.7 C)  TempSrc: Oral Oral Oral   SpO2: 98% 100% 97% 99%  Weight:      Height:        Intake/Output Summary (Last 24 hours) at 07/21/2021 0738 Last data filed at 07/21/2021 0543 Gross per 24 hour  Intake 607.88 ml  Output 550 ml  Net 57.88 ml   Filed Weights   07/09/21 2200  Weight: 125.9 kg    Examination:  General exam: Overall comfortable, not in distress,obese HEENT: PERRL Respiratory system:  no wheezes or crackles  Cardiovascular system: S1 & S2 heard, RRR.  Gastrointestinal system: Abdomen is nondistended, soft and nontender. Central nervous system: Alert and oriented Extremities: No edema, no clubbing ,no cyanosis Skin: No rashes, no ulcers,no icterus     Data Reviewed: I have personally reviewed following labs and imaging studies  CBC: Recent Labs  Lab 07/16/21 0118 07/17/21 0158 07/17/21 0945 07/17/21 2028 07/18/21 0539 07/19/21 0818  07/20/21 0238 07/21/21 0152  WBC 10.9* 11.3*  --  17.7* 14.8* 11.3* 10.4 10.9*  NEUTROABS 6.4 6.8  --   --  11.3* 7.2 6.0  --   HGB 14.1 14.2   < > 9.5* 9.7* 9.7* 8.6* 8.8*  HCT 42.2 42.3   < > 29.3* 29.7* 31.6* 26.8* 27.0*  MCV 91.5 90.8  --  92.4 93.4 99.4 95.0 94.1  PLT 294 284  --  160 178 144* 181 204   < > = values in this interval not displayed.   Basic Metabolic Panel: Recent Labs  Lab 07/17/21 0158 07/17/21 0945 07/17/21 1605 07/17/21 2028 07/18/21 0539 07/19/21 0818 07/21/21 0152  NA 136   < > 137 137 137 139 140  K 3.5   < > 4.5 3.8 4.5 4.0 3.3*  CL 106  --   --  109 108 108 108  CO2 20*  --   --  19* 19* 20* 24  GLUCOSE 124*  --   --  159* 105* 109* 95  BUN 6  --   --  8 9 8 6   CREATININE 0.99  --   --  1.02 0.93 0.97 0.85  CALCIUM 9.2  --   --  8.5* 8.6* 8.8* 8.4*   < > = values in this interval not displayed.     Recent Results (from the past 240 hour([name]))  Aerobic/Anaerobic Culture w Gram Stain (surgical/deep wound)     Status: None (Preliminary result)   Collection Time: 07/17/21 10:28 AM   Specimen: Soft Tissue, Other  Result Value Ref Range Status   Specimen Description TISSUE  Final   Special Requests THRACIC EIGHT NINE JOINT FOR CULTURE  Final   Gram Stain NO WBC SEEN NO ORGANISMS SEEN   Final   Culture   Final    NO GROWTH 3 DAYS NO ANAEROBES ISOLATED; CULTURE IN PROGRESS FOR 5 DAYS Performed at [facility], [address], [place], [place] [address]    Report Status PENDING  Incomplete  Aerobic/Anaerobic Culture w Gram Stain (surgical/deep wound)     Status: None (Preliminary result)   Collection Time: 07/17/21 12:27 PM   Specimen: Soft Tissue, Other  Result Value Ref Range Status   Specimen Description WOUND  Final   Special Requests THORAIC EIGHT NINE DISC SPACE WOUND CULTURE SPEC B  Final   Gram Stain NO WBC SEEN NO ORGANISMS SEEN   Final   Culture   Final    NO GROWTH 3 DAYS NO ANAEROBES ISOLATED; CULTURE IN PROGRESS FOR 5  DAYS Performed at [facility], [address] [place], [place] [address]    Report Status PENDING  Incomplete  MRSA Next Gen by PCR, Nasal     Status: None   Collection Time: 07/17/21  6:28 PM   Specimen: Nasal Mucosa; Nasal Swab  Result Value Ref Range Status   MRSA by PCR Next Gen NOT DETECTED NOT DETECTED Final    Comment: (NOTE) The GeneXpert MRSA Assay (FDA approved for NASAL specimens only), is one component of a comprehensive MRSA colonization surveillance program. It is not intended to diagnose MRSA infection nor to guide or monitor treatment for MRSA infections. Test performance is not FDA approved in patients less than 93 years old. Performed at [facility], [address] [place], [place] [address]      Radiology Studies: No results found.  Scheduled Meds:  sodium chloride   Intravenous Once   acetaminophen  1,000 mg Oral Q6H   Chlorhexidine Gluconate Cloth  6 each Topical Daily   feeding supplement  237 mL Oral BID BM   gabapentin  300 mg Oral TID   heparin injection (subcutaneous)  5,000 Units Subcutaneous Q12H   insulin aspart  2-6 Units Subcutaneous Q4H   multivitamin with minerals  1 tablet Oral Daily   ondansetron (ZOFRAN) IV  4 mg Intravenous Q8H   pantoprazole (PROTONIX) IV  40 mg Intravenous Daily   polyethylene glycol  17 g Oral BID   potassium chloride  20 mEq Oral Q4H   sodium chloride flush  3 mL Intravenous Q12H   Continuous Infusions:  sodium chloride Stopped (07/17/21 2225)   cefTRIAXone (ROCEPHIN)  IV 2 g (07/20/21 1725)   DAPTOmycin (CUBICIN)  IV 750 mg (07/20/21 2200)   lactated ringers 50 mL/hr at 07/20/21 1827   methocarbamol (ROBAXIN) IV Stopped (07/19/21 0358)   potassium chloride 10 mEq (07/21/21 0650)     LOS: 12 days   Burnadette Pop, MD [facility] P5/22/2023, 7:38 AM

## 2021-07-21 NOTE — Progress Notes (Signed)
[facility] ADULT ICU REPLACEMENT PROTOCOL   The patient does apply for the [facility] Adult ICU Electrolyte Replacment Protocol based on the criteria listed below:   1.Exclusion criteria: TCTS patients, ECMO patients, and Dialysis patients 2. Is GFR >/= 30 ml/min? Yes.    Patient's GFR today is > 60 3. Is SCr </= 2? Yes.   Patient's SCr is 0.85 mg/dL 4. Did SCr increase >/= 0.5 in 24 hours? No. 5.Pt's weight >40kg  Yes.   6. Abnormal electrolyte(s): K+ 3.3  7. Electrolytes replaced per protocol 8.  Call MD STAT for K+ </= 2.5, Phos </= 1, or Mag </= 1 Physician:  Dr Coral Spikes, Jettie Booze 07/21/2021 4:41 AM

## 2021-07-22 ENCOUNTER — Inpatient Hospital Stay: Payer: Self-pay

## 2021-07-22 DIAGNOSIS — M4624 Osteomyelitis of vertebra, thoracic region: Secondary | ICD-10-CM | POA: Diagnosis not present

## 2021-07-22 LAB — GLUCOSE, CAPILLARY
Glucose-Capillary: 107 mg/dL — ABNORMAL HIGH (ref 70–99)
Glucose-Capillary: 101 mg/dL — ABNORMAL HIGH (ref 70–99)
Glucose-Capillary: 170 mg/dL — ABNORMAL HIGH (ref 70–99)
Glucose-Capillary: 115 mg/dL — ABNORMAL HIGH (ref 70–99)
Glucose-Capillary: 94 mg/dL (ref 70–99)
Glucose-Capillary: 89 mg/dL (ref 70–99)

## 2021-07-22 LAB — BASIC METABOLIC PANEL
Anion gap: 3 — ABNORMAL LOW (ref 5–15)
BUN: <5 mg/dL — ABNORMAL LOW (ref 6–20)
CO2: 26 mmol/L (ref 22–32)
Calcium: 8.5 mg/dL — ABNORMAL LOW (ref 8.9–10.3)
Chloride: 106 mmol/L (ref 98–111)
Creatinine, Ser: 0.94 mg/dL (ref 0.61–1.24)
GFR, Estimated: >60 mL/min (ref >60)
Glucose, Bld: 108 mg/dL — ABNORMAL HIGH (ref 70–99)
Potassium: 3.6 mmol/L (ref 3.5–5.1)
Sodium: 135 mmol/L (ref 135–145)

## 2021-07-22 LAB — AEROBIC/ANAEROBIC CULTURE W GRAM STAIN (SURGICAL/DEEP WOUND)
Culture: NO GROWTH
Culture: NO GROWTH
Gram Stain: NONE SEEN
Gram Stain: NONE SEEN

## 2021-07-22 MED ORDER — PANTOPRAZOLE SODIUM 40 MG PO TBEC
40.0000 mg | DELAYED_RELEASE_TABLET | Freq: Every day | ORAL | Status: DC
Start: 2021-07-23 — End: 2021-07-31
  Filled 2021-07-22 (×3): qty 1

## 2021-07-22 NOTE — TOC Progression Note (Signed)
Transition of Care [facility]) - Progression Note    Patient Details  Name: Michael [name] MRN: [id number] Date of Birth: 10/06/1960  Transition of Care [facility]) CM/SW [place], [place] Phone Number: 07/22/2021, 12:53 PM  Clinical Narrative:    CSW and CM met with pt at bedside to follow up on DC planning needs. Pt notes that his plan still is to return home. He says that he will stay in the hospital until he can walk. CM advised him that would likely not be an option, but pt states his mother will do everything she can to keep him there. Pt states he has a lot of support at home. Pt tearful throughout conversation and is upset about his inability to move and his pain. He says if he can't get better we should "Just take me around back and put me out of my misery" "I don't want to live in this state, i'd be better in the grave". CSW and CM provided support. CSW will update MD about mental health concerns and pt's refusal for SNF placement. TOC will continue to follow for DC needs.    Expected Discharge Plan: [place] Barriers to Discharge: Continued Medical Work up  Expected Discharge Plan and Services Expected Discharge Plan: [place] Choice: [place] arrangements for the past 2 months: Single Family Home Expected Discharge Date: 07/15/21                                     Social Determinants of Health (SDOH) Interventions    Readmission Risk Interventions     View : No data to display.

## 2021-07-22 NOTE — Progress Notes (Signed)
Physical Therapy Treatment Patient Details Name: Michael [name] MRN: [id number] DOB: 25-Apr-1960 Today's Date: 07/22/2021   History of Present Illness 61 y/o man presents to [facility] hospital 07/09/2021 with multiple recent admissions of progressive discititis and osteomyelitis, now with worsening pain. CT revealed progressive osteomyelitis/discititis at T8-9 compared to recent studies with bony erosion and possible abscesses. Pt underwent T5-11 fusion with T8-9 corpectomy on 5/18. PMH includes HTN, obesity.    PT Comments    Pt continues to make very slow progress due to anxiety, fear of pain, pain, and lack of confidence. With encouragement pt was agreeable and worked on standing and getting to the chair for the second time today. Next visit want to attempt standing and beginning amb with the walker. Pt's brace is very difficult to keep down out of his neck in sitting due to his body habitus. Pt evidently is refusing to return to SNF and wants to go straight home but not until he is walking. Expect his progress to continue to be slow. Will need to be more ambulatory. Continue to feel SNF would be the best place for more rehab but if pt continues to refuse will need to maximize home health services.   Recommendations for follow up therapy are one component of a multi-disciplinary discharge planning process, led by the attending physician.  Recommendations may be updated based on patient status, additional functional criteria and insurance authorization.  Follow Up Recommendations  Skilled nursing-short term rehab (<3 hours/day)     Assistance Recommended at Discharge Frequent or constant Supervision/Assistance  Patient can return home with the following Two people to help with walking and/or transfers;Two people to help with bathing/dressing/bathroom;Assist for transportation;Help with stairs or ramp for entrance;Assistance with Charity fundraiser (measurements  PT);Wheelchair cushion (measurements PT);Hospital bed    Recommendations for Other Services       Precautions / Restrictions Precautions Precautions: Fall;Back Precaution Booklet Issued: No Required Braces or Orthoses: Spinal Brace Spinal Brace: Thoracolumbosacral orthotic;Applied in sitting position Restrictions Weight Bearing Restrictions: No     Mobility  Bed Mobility Overal bed mobility: Needs Assistance Bed Mobility: Sidelying to Sit   Sidelying to sit: Mod assist       General bed mobility comments: Verbal cues for technique and assist to elevate trunk into sitting.    Transfers Overall transfer level: Needs assistance Equipment used: Ambulation equipment used Transfers: Sit to/from Stand, Bed to chair/wheelchair/BSC Sit to Stand: Min assist, +2 physical assistance           General transfer comment: Assist to bring hips up into standing from bed and from seat of Stedy. Transfer via Lift Equipment: Stedy  Ambulation/Gait                   Stairs             Wheelchair Mobility    Modified Rankin (Stroke Patients Only)       Balance Overall balance assessment: Needs assistance Sitting-balance support: Single extremity supported, Feet supported Sitting balance-Leahy Scale: Fair Sitting balance - Comments: UE support for pain   Standing balance support: Bilateral upper extremity supported, During functional activity Standing balance-Leahy Scale: Poor Standing balance comment: Stedy and min guard for static standing                            Cognition Arousal/Alertness: Awake/alert Behavior During Therapy: Anxious Overall Cognitive Status: No family/caregiver present to determine  baseline cognitive functioning                                 General Comments: Pt lacks confidence in his abilities. Needs encouragement to progress.        Exercises      General Comments General comments (skin  integrity, edema, etc.): VSS on RA. RN assisting with transfer      Pertinent Vitals/Pain Pain Assessment Pain Assessment: Faces Faces Pain Scale: Hurts even more Pain Location: back Pain Descriptors / Indicators: Aching Pain Intervention(s): Limited activity within patient's tolerance, Repositioned    Home Living                          Prior Function            PT Goals (current goals can now be found in the care plan section) Progress towards PT goals: Progressing toward goals    Frequency    Min 3X/week      PT Plan Current plan remains appropriate    Co-evaluation              AM-PAC PT "6 Clicks" Mobility   Outcome Measure  Help needed turning from your back to your side while in a flat bed without using bedrails?: A Little Help needed moving from lying on your back to sitting on the side of a flat bed without using bedrails?: A Lot Help needed moving to and from a bed to a chair (including a wheelchair)?: Total Help needed standing up from a chair using your arms (e.g., wheelchair or bedside chair)?: Total Help needed to walk in hospital room?: Total Help needed climbing 3-5 steps with a railing? : Total 6 Click Score: 9    End of Session Equipment Utilized During Treatment: Gait belt;Back brace Activity Tolerance: Other (comment);Patient limited by pain (anxiety) Patient left: in chair;with call bell/phone within reach Nurse Communication: Mobility status PT Visit Diagnosis: Pain;Difficulty in walking, not elsewhere classified (R26.2) Pain - part of body:  (back)     Time: 7035-0093 PT Time Calculation (min) (ACUTE ONLY): 29 min  Charges:  $Therapeutic Activity: 23-37 mins                     [facility] PT Acute Rehabilitation Services Office [phone]    Angelina Ok [facility] 07/22/2021, 4:25 PM

## 2021-07-22 NOTE — TOC Progression Note (Signed)
Transition of Care [facility]) - Progression Note    Patient Details  Name: Michael [name] MRN: [id number] Date of Birth: November 19, 1960  Transition of [facility]) CM/SW Contact  Beckie Busing, RN Phone Number:[phone]  07/22/2021, 1:56 PM  Clinical Narrative:    OPAT orders noted. Pam with [facility] has been made aware that patient will go home with IV antibiotics.    Expected Discharge Plan: Home w Home Health Services Barriers to Discharge: Continued Medical Work up  Expected Discharge Plan and Services Expected Discharge Plan: Home w Home Health Services     Post Acute Care Choice: Home Health Living arrangements for the past 2 months: Single Family Home Expected Discharge Date: 07/15/21                                     Social Determinants of Health (SDOH) Interventions    Readmission Risk Interventions     View : No data to display.

## 2021-07-22 NOTE — Progress Notes (Signed)
Occupational Therapy Treatment Patient Details Name: Michael [name] MRN: [id number] DOB: August 22, 1960 Today's Date: 07/22/2021   History of present illness 61 y/o man presents to [facility] hospital 07/09/2021 with multiple recent admissions of progressive discititis and osteomyelitis, now with worsening pain. CT revealed progressive osteomyelitis/discititis at T8-9 compared to recent studies with bony erosion and possible abscesses. Pt underwent T5-11 fusion with T8-9 corpectomy on 5/18. PMH includes HTN, obesity.   OT comments  Pt continues to be self limiting and requires maximal encouragement to participate in OOB tasks. Educated pt on positive self talk and enforced throughout session such as saying "I can" instead of "I can't" prior to attempting task. Pt stood with steady frame 2x with min-mod A +2. He required increased time for all tasks for anxiety management. Pt continues to benefit, d/c recommendation remains appropriate.    Recommendations for follow up therapy are one component of a multi-disciplinary discharge planning process, led by the attending physician.  Recommendations may be updated based on patient status, additional functional criteria and insurance authorization.    Follow Up Recommendations  Skilled nursing-short term rehab (<3 hours/day)    Assistance Recommended at Discharge Frequent or constant Supervision/Assistance  Patient can return home with the following  Two people to help with walking and/or transfers;Two people to help with bathing/dressing/bathroom;Direct supervision/assist for medications management;Direct supervision/assist for financial management;Assist for transportation;Help with stairs or ramp for entrance   Equipment Recommendations  Other (comment)    Recommendations for Other Services      Precautions / Restrictions Precautions Precautions: Fall;Back Precaution Booklet Issued: No Precaution Comments: verbally reviewed log roll technique Required  Braces or Orthoses: Spinal Brace Spinal Brace: Thoracolumbosacral orthotic;Applied in sitting position Restrictions Weight Bearing Restrictions: No       Mobility Bed Mobility Overal bed mobility: Needs Assistance Bed Mobility: Sit to Sidelying, Rolling Rolling: Min assist       Sit to sidelying: Max assist General bed mobility comments: attempted to complete log roll, required max A +2    Transfers Overall transfer level: Needs assistance Equipment used: Ambulation equipment used Transfers: Sit to/from Stand, Bed to chair/wheelchair/BSC Sit to Stand: Mod assist, +2 physical assistance, +2 safety/equipment           General transfer comment: max encouragement required. declined amnulation attempt, agreeable to steady Transfer via Lift Equipment: Stedy   Balance Overall balance assessment: Needs assistance Sitting-balance support: Single extremity supported, Feet supported Sitting balance-Leahy Scale: Fair Sitting balance - Comments: minimal tolerance for unsupported sitting   Standing balance support: Bilateral upper extremity supported, During functional activity Standing balance-Leahy Scale: Poor                             ADL either performed or assessed with clinical judgement   ADL Overall ADL's : Needs assistance/impaired                                     Functional mobility during ADLs: Moderate assistance;+2 for physical assistance;+2 for safety/equipment General ADL Comments: used the steady, min-mod A +2 for sit<>stand. required maximal encouragement and increased time    Extremity/Trunk Assessment Upper Extremity Assessment Upper Extremity Assessment: Generalized weakness   Lower Extremity Assessment Lower Extremity Assessment: Defer to PT evaluation        Vision   Vision Assessment?: No apparent visual deficits   Perception Perception Perception:  Not tested   Praxis Praxis Praxis: Not tested    Cognition  Arousal/Alertness: Awake/alert Behavior During Therapy: Anxious Overall Cognitive Status: No family/caregiver present to determine baseline cognitive functioning                                 General Comments: self-limiting, requires max encouragement.        Exercises      Shoulder Instructions       General Comments VSS on RA, RN present and assisting    Pertinent Vitals/ Pain       Pain Assessment Pain Assessment: Faces Faces Pain Scale: Hurts even more Pain Location: back Pain Descriptors / Indicators: Aching Pain Intervention(s): Limited activity within patient's tolerance, Monitored during session  Home Living                                          Prior Functioning/Environment              Frequency  Min 2X/week        Progress Toward Goals  OT Goals(current goals can now be found in the care plan section)  Progress towards OT goals: Progressing toward goals  Acute Rehab OT Goals Patient Stated Goal: to lay down OT Goal Formulation: With patient Time For Goal Achievement: 08/01/21 Potential to Achieve Goals: Fair ADL Goals Pt Will Perform Grooming: with set-up;sitting Pt Will Perform Lower Body Bathing: with modified independence;sitting/lateral leans;sit to/from stand;with adaptive equipment Pt Will Perform Upper Body Dressing: with min assist;sitting Pt Will Perform Lower Body Dressing: sitting/lateral leans;with max assist;sit to/from stand Pt Will Transfer to Toilet: with max assist;bedside commode Pt Will Perform Toileting - Clothing Manipulation and hygiene: with max assist;with adaptive equipment;sitting/lateral leans;sit to/from stand Additional ADL Goal #1: Pt will complete bed mobility wiht mod A +2 to increase independence with ADL tasks  Plan Discharge plan remains appropriate;Frequency remains appropriate    Co-evaluation                 AM-PAC OT "6 Clicks" Daily Activity     Outcome  Measure   Help from another person eating meals?: A Little Help from another person taking care of personal grooming?: A Lot Help from another person toileting, which includes using toliet, bedpan, or urinal?: A Lot Help from another person bathing (including washing, rinsing, drying)?: A Lot Help from another person to put on and taking off regular upper body clothing?: A Lot Help from another person to put on and taking off regular lower body clothing?: Total 6 Click Score: 12    End of Session Equipment Utilized During Treatment: Back brace (Steady)  OT Visit Diagnosis: Unsteadiness on feet (R26.81);Other abnormalities of gait and mobility (R26.89);Muscle weakness (generalized) (M62.81);Pain   Activity Tolerance Patient limited by pain (self limiting)   Patient Left in bed;with bed alarm set;with call bell/phone within reach;with nursing/sitter in room   Nurse Communication Mobility status        Time: 6948-5462 OT Time Calculation (min): 23 min  Charges: OT General Charges $OT Visit: 1 Visit OT Treatments $Therapeutic Activity: 23-37 mins   Denisia Harpole A Tyvon Eggenberger 07/22/2021, 12:05 PM

## 2021-07-22 NOTE — Progress Notes (Signed)
PROGRESS NOTE  Michael PlumberJohn R [name]  ZOX:[id number] DOB: 25-Jul-1960 DOA: 07/09/2021 PCP: Lovey NewcomerBoyd, William S, PA   Brief Narrative: Patient is a 61 year old male with history of T8-9 discitis/osteomyelitis, E. coli bacteremia presented [facility] with complaint of pain.  CT imaging again showed T8-T9 osteomyelitis, discitis, now with evidence of bony erosion, progression since prior.  [facility], [facility]  Patient was started on broad-spectrum antibiotics, ID consulted.  Patient underwent T5-T11 fusion, T8-T9 corpectomy by neurosurgery and was transferred to ICU for postop stay.  Hospital course remarkable for poor mobility.  PT/OT recommending skilled nursing facility on discharge.  Medically stable for discharge but patient is reluctant on SN  facility placement, waiting for improvement in mobility.  Needs TOC/PT recommendation  Assessment & Plan:  Principal Problem:   Osteomyelitis of thoracic region [facility](HCC) Active Problems:   Thoracic discitis   Poor dentition   Nausea and vomiting   Essential hypertension  Thoracic vertebral osteomyelitis:History of T8-9 discitis/osteomyelitis, E. coli bacteremia [facility] with complaint of pain.  CT imaging again showed T8-T9 osteomyelitis, discitis, now with evidence of bony erosion, progression since prior.Patient was started on broad-spectrum antibiotics, ID consulted.  Patient underwent T5-T11 fusion, T8-T9 corpectomy by neurosurgery.  Neurosurgery recommends TLSO brace when out of bed.  Blood cultures no growth till date. Infectious disease recommended ceftriaxone till 7/6.  Plan for PICC line placement. Continue pain management, supportive care.  Acute blood loss anemia: Currently hemoglobin stable in the range of 8.  He was transfused with a unit of PRBC here during this hospitalization .currently hemoglobin stable  Medical noncompliance/suicidal ideation:  Seen by psych during this hospitalization, cleared, no need of inpatient psychiatric admission  Prediabetes: Hemoglobin A1c of 6.1.  Currently on sliding scale insulin  Oral candidiasis: Treated with nystatin  Hypertension: Currently blood pressure soft, only on as needed medications for now.  AKI: Resolved  GERD/chronic nausea/vomiting: Continue daily PPI.  He was seen by gastroenterology on April when he declined EGD.  Debility/deconditioning: Patient seen by PT/OT and recommended skilled nursing discharge.  Poor mobility.  Needs lots of encouragement.  Patient reluctant on skilled nursing facility placement, likely end up with home health.  TOC consulted  Obesity: BMI of 38   Nutrition Problem: Increased nutrient needs Etiology: post-op healing    DVT prophylaxis:heparin injection 5,000 Units Start: 07/21/21 1400 Place and maintain sequential compression device Start: 07/17/21 1833 SCD's Start: 07/17/21 1738     Code Status: Full Code  Family Communication: Called wife on phone ,cudnt connect  Patient status:Inpatient  Patient is from :Home  Anticipated discharge to:SNF versus home health  Estimated DC date: Medically stable for discharge.  Patient reluctant on skilled nursing facility.  Waiting for TOC/PT recommendation for discharge   Consultants: ID, neurosurgery  Procedures: Vertebral fusion  Antimicrobials:  Anti-infectives (From admission, onward)    Start     Dose/Rate Route Frequency Ordered Stop   07/17/21 1830  ceFAZolin (ANCEF) IVPB 2g/100 mL premix  Status:  Discontinued        2 g 200 mL/hr over 30 Minutes Intravenous Every 8 hours 07/17/21 1737 07/17/21 1744   07/17/21 1745  cefTRIAXone (ROCEPHIN) 2 g in sodium chloride 0.9 % 100 mL IVPB        2 g 200 mL/hr over 30 Minutes Intravenous Every 24 hours 07/17/21 1745     07/16/21 2234  ceFAZolin (ANCEF) IVPB 3g/100 mL premix  3 g 200 mL/hr over 30 Minutes Intravenous 30 min pre-op 07/16/21  2234 07/17/21 1226   07/11/21 1700  vancomycin (VANCOCIN) IVPB 1000 mg/200 mL premix  Status:  Discontinued        1,000 mg 200 mL/hr over 60 Minutes Intravenous Every 12 hours 07/11/21 0923 07/11/21 1257   07/11/21 1345  DAPTOmycin (CUBICIN) 750 mg in sodium chloride 0.9 % IVPB  Status:  Discontinued        8 mg/kg  95.5 kg (Adjusted) 130 mL/hr over 30 Minutes Intravenous Daily 07/11/21 1257 07/22/21 0909   07/11/21 1345  cefTRIAXone (ROCEPHIN) 2 g in sodium chloride 0.9 % 100 mL IVPB  Status:  Discontinued        2 g 200 mL/hr over 30 Minutes Intravenous Every 24 hours 07/11/21 1257 07/17/21 1745   07/10/21 1400  vancomycin (VANCOREADY) IVPB 1250 mg/250 mL  Status:  Discontinued        1,250 mg 166.7 mL/hr over 90 Minutes Intravenous Every 12 hours 07/09/21 2249 07/11/21 0923   07/09/21 2230  piperacillin-tazobactam (ZOSYN) IVPB 3.375 g  Status:  Discontinued        3.375 g 12.5 mL/hr over 240 Minutes Intravenous Every 8 hours 07/09/21 2137 07/11/21 1257       Subjective: Patient seen and examined at the bedside this morning.  Hemodynamically stable.  Sitting on the chair.  Complains of weakness.  Pain well controlled.  We discussed about skilled nursing facility but he said he is not interested on that and says he is not ready to go home yet  Objective: Vitals:   07/21/21 1951 07/21/21 2310 07/22/21 0330 07/22/21 0735  BP: 129/80 126/90 (!) 138/91 (!) 150/97  Pulse: 99 (!) 106 93 (!) 101  Resp: 17 20 17 20   Temp: 98.5 F (36.9 C) 98.1 F (36.7 C) 98.6 F (37 C) 98.7 F (37.1 C)  TempSrc: Oral Oral Oral   SpO2: 100% 96% 98% 98%  Weight:      Height:        Intake/Output Summary (Last 24 hours) at 07/22/2021 1100 Last data filed at 07/22/2021 0900 Gross per 24 hour  Intake 1080 ml  Output 2050 ml  Net -970 ml   Filed Weights   07/09/21 2200  Weight: 125.9 kg    Examination:  General exam: Overall comfortable, not in distress,obese HEENT: PERRL Respiratory  system:  no wheezes or crackles  Cardiovascular system: S1 & S2 heard, RRR.  Gastrointestinal system: Abdomen is nondistended, soft and nontender. Central nervous system: Alert and oriented Extremities: No edema, no clubbing ,no cyanosis Skin: No rashes, no ulcers,no icterus   MSK: TLSO brace  Data Reviewed: I have personally reviewed following labs and imaging studies  CBC: Recent Labs  Lab 07/16/21 0118 07/17/21 0158 07/17/21 0945 07/17/21 2028 07/18/21 0539 07/19/21 0818 07/20/21 0238 07/21/21 0152  WBC 10.9* 11.3*  --  17.7* 14.8* 11.3* 10.4 10.9*  NEUTROABS 6.4 6.8  --   --  11.3* 7.2 6.0  --   HGB 14.1 14.2   < > 9.5* 9.7* 9.7* 8.6* 8.8*  HCT 42.2 42.3   < > 29.3* 29.7* 31.6* 26.8* 27.0*  MCV 91.5 90.8  --  92.4 93.4 99.4 95.0 94.1  PLT 294 284  --  160 178 144* 181 204   < > = values in this interval not displayed.   Basic Metabolic Panel: Recent Labs  Lab 07/17/21 2028 07/18/21 0539 07/19/21 0818 07/21/21 0152 07/22/21 0407  NA  137 137 139 140 135  K 3.8 4.5 4.0 3.3* 3.6  CL 109 108 108 108 106  CO2 19* 19* 20* 24 26  GLUCOSE 159* 105* 109* 95 108*  BUN 8 9 8 6  <5*  CREATININE 1.02 0.93 0.97 0.85 0.94  CALCIUM 8.5* 8.6* 8.8* 8.4* 8.5*     Recent Results (from the past 240 hour(s))  Aerobic/Anaerobic Culture w Gram Stain (surgical/deep wound)     Status: None (Preliminary result)   Collection Time: 07/17/21 10:28 AM   Specimen: Soft Tissue, Other  Result Value Ref Range Status   Specimen Description TISSUE  Final   Special Requests THRACIC EIGHT NINE JOINT FOR CULTURE  Final   Gram Stain NO WBC SEEN NO ORGANISMS SEEN   Final   Culture   Final    NO GROWTH 4 DAYS NO ANAEROBES ISOLATED; CULTURE IN PROGRESS FOR 5 DAYS Performed at [facility] Lab, [address] [place], [place]    Report Status PENDING  Incomplete  Aerobic/Anaerobic Culture w Gram Stain (surgical/deep wound)     Status: None (Preliminary result)   Collection Time:  07/17/21 12:27 PM   Specimen: Soft Tissue, Other  Result Value Ref Range Status   Specimen Description WOUND  Final   Special Requests THORAIC EIGHT NINE DISC SPACE WOUND CULTURE SPEC B  Final   Gram Stain NO WBC SEEN NO ORGANISMS SEEN   Final   Culture   Final    NO GROWTH 4 DAYS NO ANAEROBES ISOLATED; CULTURE IN PROGRESS FOR 5 DAYS Performed at [facility], [address] [place], [place]    Report Status PENDING  Incomplete  MRSA Next Gen by PCR, Nasal     Status: None   Collection Time: 07/17/21  6:28 PM   Specimen: Nasal Mucosa; Nasal Swab  Result Value Ref Range Status   MRSA by PCR Next Gen NOT DETECTED NOT DETECTED Final    Comment: (NOTE) The GeneXpert MRSA Assay (FDA approved for NASAL specimens only), is one component of a comprehensive MRSA colonization surveillance program. It is not intended to diagnose MRSA infection nor to guide or monitor treatment for MRSA infections. Test performance is not FDA approved in patients less than 54 years old. Performed at [facility], [address] [place], [place]      Radiology Studies: No results found.  Scheduled Meds:  sodium chloride   Intravenous Once   acetaminophen  1,000 mg Oral Q6H   Chlorhexidine Gluconate Cloth  6 each Topical Daily   feeding supplement  237 mL Oral BID BM   gabapentin  300 mg Oral TID   heparin injection (subcutaneous)  5,000 Units Subcutaneous Q8H   insulin aspart  2-6 Units Subcutaneous Q4H   multivitamin with minerals  1 tablet Oral Daily   ondansetron (ZOFRAN) IV  4 mg Intravenous Q8H   pantoprazole (PROTONIX) IV  40 mg Intravenous Daily   polyethylene glycol  17 g Oral BID   sodium chloride flush  3 mL Intravenous Q12H   Continuous Infusions:  sodium chloride Stopped (07/17/21 2225)   cefTRIAXone (ROCEPHIN)  IV 2 g (07/21/21 1745)   lactated ringers 50 mL/hr at 07/20/21 1827   methocarbamol (ROBAXIN) IV Stopped (07/22/21 0050)     LOS: 13 days    07/24/21, MD [facility] P5/23/2023, 11:00 AM

## 2021-07-22 NOTE — Progress Notes (Signed)
[facility] for Infectious Disease  Date of Admission:  07/09/2021           Reason for visit: Follow up on discitis and OM  Current antibiotics: Daptomycin Ceftriaxone   ASSESSMENT:    61 y.o. male admitted with:  T8-T9 discitis/OM: with progressive bony erosion and spinal stenosis, cord compression.  Status post T8-T9 corpectomy, bilateral segmental pedicle screw instrumentation T5,T6, T7,T10, T11; Posterior lateral arthrodesis T5-6, T6-7, T7-8, T8-9, T9-10, T 10-11; Bilateral; t7-t10 laminectomies for decompression, placement of anterior biomechanical device, K2M capri titanium expandable corpectomy cage and use of autograft. 5/18 OR cx are NGTD.  History of E coli bacteremia and thoracic discitis in March 2023  RECOMMENDATIONS:    Will continue with ceftriaxone as suspect this is all secondary to E coli.   Will stop daptomycin since cultures and MRSA PCR were negative PICC line PT/OT Will sign off, please see OPAT below  Diagnosis: Discitis/OM  Culture Result: E coli  No Known Allergies  OPAT Orders Discharge antibiotics to be given via PICC line Discharge antibiotics: Per pharmacy protocol ceftriaxone 2gm daily  End Date: 09/04/21  [facility] Care Per Protocol:  Home health RN for IV administration and teaching; PICC line care and labs.    Labs weekly while on IV antibiotics: _xx_ CBC with differential __ BMP _xx_ CMP _xx_ CRP _xx_ ESR __ Vancomycin trough __ CK  __ Please pull PIC at completion of IV antibiotics __ Please leave PIC in place until doctor has seen patient or been notified  Fax weekly labs to [phone]  Clinic Follow Up Appt: 09/03/21 with Dr Gale Journey at 2:30pm    Principal Problem:   Osteomyelitis of thoracic region [facility]) Active Problems:   Essential hypertension   Thoracic discitis   Poor dentition   Nausea and vomiting    MEDICATIONS:    Scheduled Meds:  sodium chloride   Intravenous Once   acetaminophen  1,000 mg  Oral Q6H   Chlorhexidine Gluconate Cloth  6 each Topical Daily   feeding supplement  237 mL Oral BID BM   gabapentin  300 mg Oral TID   heparin injection (subcutaneous)  5,000 Units Subcutaneous Q8H   insulin aspart  2-6 Units Subcutaneous Q4H   multivitamin with minerals  1 tablet Oral Daily   ondansetron (ZOFRAN) IV  4 mg Intravenous Q8H   pantoprazole (PROTONIX) IV  40 mg Intravenous Daily   polyethylene glycol  17 g Oral BID   sodium chloride flush  3 mL Intravenous Q12H   Continuous Infusions:  sodium chloride Stopped (07/17/21 2225)   cefTRIAXone (ROCEPHIN)  IV 2 g (07/21/21 1745)   DAPTOmycin (CUBICIN)  IV Stopped (07/21/21 2210)   lactated ringers 50 mL/hr at 07/20/21 1827   methocarbamol (ROBAXIN) IV 500 mg (07/22/21 0020)   PRN Meds:.acetaminophen **OR** acetaminophen, bisacodyl, diphenhydrAMINE, hydrALAZINE, HYDROmorphone (DILAUDID) injection, menthol-cetylpyridinium **OR** phenol, methocarbamol **OR** methocarbamol (ROBAXIN) IV, mineral oil-hydrophilic petrolatum, oxyCODONE, oxyCODONE, prochlorperazine, sodium chloride flush, traZODone  SUBJECTIVE:   24 hour events:  No acute events noted Afebrile Cx remain negative No other complaints He was able to sit up in chair today but is weak   Review of Systems  All other systems reviewed and are negative.    OBJECTIVE:   Blood pressure (!) 150/97, pulse (!) 101, temperature 98.7 F (37.1 C), resp. rate 20, height _0  (1.803 m), weight 125.9 kg, SpO2 98 %. Body mass index is 38.71 kg/m.  Physical Exam Constitutional:  General: He is not in acute distress.    Appearance: Normal appearance.     Comments: Sitting up in chair.   Eyes:     Extraocular Movements: Extraocular movements intact.     Conjunctiva/sclera: Conjunctivae normal.  Pulmonary:     Effort: Pulmonary effort is normal. No respiratory distress.  Abdominal:     General: There is no distension.     Palpations: Abdomen is soft.   Musculoskeletal:     Cervical back: Normal range of motion and neck supple.     Comments: Wearing back brace  Skin:    General: Skin is warm and dry.  Neurological:     General: No focal deficit present.     Mental Status: He is alert and oriented to person, place, and time.  Psychiatric:        Mood and Affect: Mood normal.        Behavior: Behavior normal.     Lab Results: Lab Results  Component Value Date   WBC 10.9 (H) 07/21/2021   HGB 8.8 (L) 07/21/2021   HCT 27.0 (L) 07/21/2021   MCV 94.1 07/21/2021   PLT 204 07/21/2021    Lab Results  Component Value Date   NA 135 07/22/2021   K 3.6 07/22/2021   CO2 26 07/22/2021   GLUCOSE 108 (H) 07/22/2021   BUN <5 (L) 07/22/2021   CREATININE 0.94 07/22/2021   CALCIUM 8.5 (L) 07/22/2021   GFRNONAA >60 07/22/2021    Lab Results  Component Value Date   ALT 18 07/21/2021   AST 19 07/21/2021   ALKPHOS 65 07/21/2021   BILITOT 0.8 07/21/2021       Component Value Date/Time   CRP 1.3 (H) 07/14/2021 0137       Component Value Date/Time   ESRSEDRATE 49 (H) 07/11/2021 2001     I have reviewed the micro and lab results in Epic.  Imaging: No results found.   Imaging independently reviewed in Epic.    Raynelle [facility] for Infectious [facility] [phone] pager 07/22/2021, 8:36 AM  I have personally spent 50 minutes involved in face-to-face and non-face-to-face activities for this patient on the day of the visit. Professional time spent includes the following activities: Preparing to see the patient (review of tests), Obtaining and/or reviewing separately obtained history (admission/discharge record), Performing a medically appropriate examination and/or evaluation , Ordering medications/tests/procedures, referring and communicating with other health care professionals, Documenting clinical information in the EMR, Independently interpreting results (not separately reported),  Communicating results to the patient/family/caregiver, Counseling and educating the patient/family/caregiver and Care coordination (not separately reported).

## 2021-07-22 NOTE — Progress Notes (Addendum)
PHARMACY CONSULT NOTE FOR:  OUTPATIENT  PARENTERAL ANTIBIOTIC THERAPY (OPAT)  Indication: T8-9 Discitis  Regimen: Ceftriaxone 2 gm IV Q 24 hours  End date: 09/04/21  IV antibiotic discharge orders are pended. To discharging provider:  please sign these orders via discharge navigator,  Select New Orders & click on the button choice - Manage This Unsigned Work.     Thank you for allowing pharmacy to be a part of this patient's care.  Sharin Mons, PharmD, BCPS, BCIDP Infectious Diseases Clinical Pharmacist Phone: [phone] 07/22/2021, 9:15 AM

## 2021-07-22 NOTE — Progress Notes (Signed)
Subjective: Michael [name] reports that he is not able to wrap his head around getting out of bed and continues to be reluctant with increasing mobility. No acute events overnight.  Objective: Vital signs in last 24 hours: Temp:  [97.9 F (36.6 C)-98.7 F (37.1 C)] 98.7 F (37.1 C) (05/23 0735) Pulse Rate:  [93-106] 101 (05/23 0735) Resp:  [17-20] 20 (05/23 0735) BP: (126-150)/(80-97) 150/97 (05/23 0735) SpO2:  [96 %-100 %] 98 % (05/23 0735)  Intake/Output from previous day: 05/22 0701 - 05/23 0700 In: 1080 [P.O.:1080] Out: 2200 [Urine:2200] Intake/Output this shift: No intake/output data recorded.  Physical Exam: Michael [name] is awake, A/O X 4, and conversant. He has a flat affect. Eyes open spontaneously. They are in NAD. Speech is fluent and appropriate. MAEW with good strength that is symmetric bilaterally. BUE 4+/5 throughout, BLE 4+/5 throughout. No clonus appreciated in BLE. No pathological reflexes. Sensation to light touch is intact. PERLA, EOMI. CNs grossly intact. Dressing is clean dry intact. Incision is well approximated with no drainage, erythema, or edema.     Lab Results: Recent Labs    07/20/21 0238 07/21/21 0152  WBC 10.4 10.9*  HGB 8.6* 8.8*  HCT 26.8* 27.0*  PLT 181 204   BMET Recent Labs    07/21/21 0152 07/22/21 0407  NA 140 135  K 3.3* 3.6  CL 108 106  CO2 24 26  GLUCOSE 95 108*  BUN 6 <5*  CREATININE 0.85 0.94  CALCIUM 8.4* 8.5*    Studies/Results: No results found.  Assessment/Plan: Michael [name] is s/p T5-11 fusion with T8-9 corpectomy due to osteodiscitis with kyphosis and stenosis on 07/17/2021. He is continuing to recover well from his surgery and reports that his back pain is continuing to slowly improve. His neurological examination stable. He continues to be resistant to mobility despite encouragement form NSX, nursing staff, and therapies. We discussed again this morning about the need to mobilize and potential complications that could arise with  immobility. He stated he knows he has to move but states he is unable to wrap his head around getting out of bed and then becomes tearful and talks about how multiple people in his family are sick. I encouraged him to work on movement while in bed and to set small achievable daily goals. He declined attempts  at mobility yesterday. I encouraged the Michael [name] to work with his nurse and therapy team to ensure his pain is addressed prior to working with therapies.  I also encouraged the Michael [name]'s RN to provide the Michael [name] with mobility opportunities. PT/OT recommending SNF for latter convalescence. Michael [name] stated that he would prefer no to go to a SNF at discharge. I informed him it all depends on how much recovery is is able to obtain while in the hospital. If he is able to progress, he might only need CIR or HHPT/OT.     -TLSO when OOB -PT/OT  -Pain control -Encourage mobility and provide opportunities to mobilize throughout the day  -Incentive spirometer    LOS: 13 days     Marvis Moeller, DNP, AGNP-C Neurosurgery Nurse Practitioner  [facility] [address], [address], [place], [place] [address] P: [phone]    F: [phone]  07/22/2021 7:46 AM

## 2021-07-23 DIAGNOSIS — M4624 Osteomyelitis of vertebra, thoracic region: Secondary | ICD-10-CM | POA: Diagnosis not present

## 2021-07-23 LAB — GLUCOSE, CAPILLARY
Glucose-Capillary: 116 mg/dL — ABNORMAL HIGH (ref 70–99)
Glucose-Capillary: 110 mg/dL — ABNORMAL HIGH (ref 70–99)
Glucose-Capillary: 166 mg/dL — ABNORMAL HIGH (ref 70–99)
Glucose-Capillary: 128 mg/dL — ABNORMAL HIGH (ref 70–99)
Glucose-Capillary: 135 mg/dL — ABNORMAL HIGH (ref 70–99)
Glucose-Capillary: 123 mg/dL — ABNORMAL HIGH (ref 70–99)

## 2021-07-23 MED ORDER — SODIUM CHLORIDE 0.9% FLUSH: 10.0000 mL | INTRAVENOUS | Status: DC | PRN

## 2021-07-23 MED ORDER — SODIUM CHLORIDE 0.9% FLUSH
10.0000 mL | Freq: Two times a day (BID) | INTRAVENOUS | Status: DC
  Administered 2021-07-23 – 2021-07-31 (×17): 10 mL

## 2021-07-23 MED ORDER — ONDANSETRON HCL 4 MG PO TABS: 4.0000 mg | ORAL_TABLET | Freq: Three times a day (TID) | ORAL | Status: DC | PRN

## 2021-07-23 NOTE — Progress Notes (Signed)
Physical Therapy Treatment Patient Details Name: Michael [name] MRN: [id number] DOB: December 14, 1960 Today's Date: 07/23/2021   History of Present Illness 61 y/o man presents to [facility] hospital 07/09/2021 with multiple recent admissions of progressive discititis and osteomyelitis, now with worsening pain. CT revealed progressive osteomyelitis/discititis at T8-9 compared to recent studies with bony erosion and possible abscesses. Pt underwent T5-11 fusion with T8-9 corpectomy on 5/18. PMH includes HTN, obesity.    PT Comments    Pt continues to limit himself with a constant stream of negative talk about what he can't do and why he can't do it.  Takes extended amount of time to convince pt that he can mobilize. Pt able to stand with 2 person min assist. Used Stedy to get to chair. Wanted pt to attempt to stand from chair with walker but pt adamantly refusing stating he can't do it. In my professional opinion pt is physically capable of standing with a walker and amb a short distance with assistance but pt self limiting behavior is hindering this.   Recommendations for follow up therapy are one component of a multi-disciplinary discharge planning process, led by the attending physician.  Recommendations may be updated based on patient status, additional functional criteria and insurance authorization.  Follow Up Recommendations  Skilled nursing-short term rehab (<3 hours/day)     Assistance Recommended at Discharge Frequent or constant Supervision/Assistance  Patient can return home with the following Two people to help with walking and/or transfers;Two people to help with bathing/dressing/bathroom;Assist for transportation;Help with stairs or ramp for entrance;Assistance with Charity fundraiser (measurements PT);Wheelchair cushion (measurements PT);Hospital bed    Recommendations for Other Services       Precautions / Restrictions Precautions Precautions:  Fall;Back Precaution Booklet Issued: No Required Braces or Orthoses: Spinal Brace Spinal Brace: Thoracolumbosacral orthotic;Applied in sitting position Restrictions Weight Bearing Restrictions: No     Mobility  Bed Mobility Overal bed mobility: Needs Assistance Bed Mobility: Sidelying to Sit   Sidelying to sit: Mod assist       General bed mobility comments: Verbal cues for technique to follow back precautions. Assist to elevate trunk into sitting.    Transfers Overall transfer level: Needs assistance Equipment used: Ambulation equipment used Transfers: Sit to/from Stand, Bed to chair/wheelchair/BSC Sit to Stand: Min assist, +2 physical assistance           General transfer comment: Assist to bring hips up into standing from bed. Transfer via Lift Equipment: Stedy  Ambulation/Gait                   Stairs             Wheelchair Mobility    Modified Rankin (Stroke Patients Only)       Balance Overall balance assessment: Needs assistance Sitting-balance support: Single extremity supported, Feet supported Sitting balance-Leahy Scale: Fair Sitting balance - Comments: UE support for pain   Standing balance support: Bilateral upper extremity supported, During functional activity Standing balance-Leahy Scale: Poor Standing balance comment: Stedy and min guard for static standing                            Cognition Arousal/Alertness: Awake/alert Behavior During Therapy: Anxious Overall Cognitive Status: No family/caregiver present to determine baseline cognitive functioning  General Comments: Pt has a constant stream of negative talk about what he can't do and why he can't do it. Very difficult to get the pt to focus on the task at hand. Very self limiting        Exercises      General Comments General comments (skin integrity, edema, etc.): VSS on RA      Pertinent Vitals/Pain  Pain Assessment Pain Assessment: Faces Faces Pain Scale: Hurts even more Pain Location: back, rt shoulder Pain Descriptors / Indicators: Aching, Pins and needles Pain Intervention(s): Limited activity within patient's tolerance, Repositioned    Home Living                          Prior Function            PT Goals (current goals can now be found in the care plan section) Progress towards PT goals: Not progressing toward goals - comment (self limiting)    Frequency    Min 3X/week      PT Plan Current plan remains appropriate    Co-evaluation              AM-PAC PT "6 Clicks" Mobility   Outcome Measure  Help needed turning from your back to your side while in a flat bed without using bedrails?: A Little Help needed moving from lying on your back to sitting on the side of a flat bed without using bedrails?: A Lot Help needed moving to and from a bed to a chair (including a wheelchair)?: Total Help needed standing up from a chair using your arms (e.g., wheelchair or bedside chair)?: Total Help needed to walk in hospital room?: Total Help needed climbing 3-5 steps with a railing? : Total 6 Click Score: 9    End of Session Equipment Utilized During Treatment: Gait belt;Back brace Activity Tolerance: Other (comment);Patient limited by pain (self limiting) Patient left: in chair;with call bell/phone within reach;with chair alarm set Nurse Communication: Mobility status PT Visit Diagnosis: Pain;Difficulty in walking, not elsewhere classified (R26.2) Pain - part of body:  (back)     Time: 1610-9604 PT Time Calculation (min) (ACUTE ONLY): 25 min  Charges:  $Therapeutic Activity: 23-37 mins                     [facility] PT Acute Rehabilitation Services Office [phone]    Angelina Ok [facility] 07/23/2021, 1:15 PM

## 2021-07-23 NOTE — Progress Notes (Signed)
PROGRESS NOTE  Michael [name]  WUJ:[id number] DOB: 14-Jul-1960 DOA: 07/09/2021 PCP: Lovey NewcomerBoyd, William S, PA   Brief Narrative: Patient is a 61 year old male with history of T8-9 discitis/osteomyelitis, E. coli bacteremia [facility] with complaint of pain.  CT imaging again showed T8-T9 osteomyelitis, discitis, now with evidence of bony erosion, progression since prior.  [facility]  Patient was started on broad-spectrum antibiotics, ID consulted.  Patient underwent T5-T11 fusion, T8-T9 corpectomy by neurosurgery and was transferred to ICU for postop stay.  Hospital course remarkable for poor mobility.  PT/OT recommending skilled nursing facility on discharge.  Medically stable for discharge but patient is reluctant on SNF placement/wants to go home and says he is not ready yet.We are waiting for improvement in mobility.  Needs TOC/PT recommendation  Assessment & Plan:  Principal Problem:   Osteomyelitis of thoracic region [facility](HCC) Active Problems:   Thoracic discitis   Poor dentition   Nausea and vomiting   Essential hypertension  Thoracic vertebral osteomyelitis:History of T8-9 discitis/osteomyelitis, E. coli bacteremia [facility] with complaint of pain.  CT imaging again showed T8-T9 osteomyelitis, discitis, now with evidence of bony erosion, progression since prior.Patient was started on broad-spectrum antibiotics, ID consulted.  Patient underwent T5-T11 fusion, T8-T9 corpectomy by neurosurgery.  Neurosurgery recommends TLSO brace when out of bed. Blood cultures no growth till date. Infectious disease recommended ceftriaxone till 7/6. S/P  PICC line placement. Continue pain management, supportive care.  Currently hemodynamically stable.  Acute blood loss anemia: Currently hemoglobin stable in the range of 8.  He was transfused with a unit of PRBC here during this hospitalization  .currently hemoglobin stable  Medical noncompliance/suicidal ideation: Seen by psych during this hospitalization, cleared, no need of inpatient psychiatric admission  Prediabetes: Hemoglobin A1c of 6.1.  Currently on sliding scale insulin.  Does not take any medications at home.  Oral candidiasis: Treated with nystatin  Hypertension: Currently blood pressure stable without any medications, only on as needed medications for now.  Takes lisinopril-hydrochlorothiazide at home  AKI: Resolved  GERD/chronic nausea/vomiting: Continue daily PPI.  He was seen by gastroenterology on April when he declined EGD.  Debility/deconditioning: Patient seen by PT/OT and recommended skilled nursing discharge.  Poor mobility.  Needs lots of encouragement.  Patient reluctant on skilled nursing facility placement, wants to go home with home health but says he is not ready yet.  [facility]  Obesity: BMI of 38   Nutrition Problem: Increased nutrient needs Etiology: post-op healing    DVT prophylaxis:heparin injection 5,000 Units Start: 07/21/21 1400 Place and maintain sequential compression device Start: 07/17/21 1833 SCD's Start: 07/17/21 1738     Code Status: Full Code  Family Communication: Called wife on phone several times since last few days, could not connect.  Called mother today and she states patient's wife is the Management consultantdecision maker.  Patient status:Inpatient  Patient is from :Home  Anticipated discharge to: Home with home health  Estimated DC date: Medically stable for discharge.  Patient reluctant on skilled nursing facility and wants to go home with home health.  Says he is not ready yet and anticipates more therapy here. waiting for TOC/PT recommendation for discharge   Consultants: ID, neurosurgery  Procedures: Vertebral fusion  Antimicrobials:  Anti-infectives (From admission, onward)    Start     Dose/Rate Route Frequency Ordered Stop   07/17/21 1830   ceFAZolin (ANCEF) IVPB  2g/100 mL premix  Status:  Discontinued        2 g 200 mL/hr over 30 Minutes Intravenous Every 8 hours 07/17/21 1737 07/17/21 1744   07/17/21 1745  cefTRIAXone (ROCEPHIN) 2 g in sodium chloride 0.9 % 100 mL IVPB        2 g 200 mL/hr over 30 Minutes Intravenous Every 24 hours 07/17/21 1745     07/16/21 2234  ceFAZolin (ANCEF) IVPB 3g/100 mL premix        3 g 200 mL/hr over 30 Minutes Intravenous 30 min pre-op 07/16/21 2234 07/17/21 1226   07/11/21 1700  vancomycin (VANCOCIN) IVPB 1000 mg/200 mL premix  Status:  Discontinued        1,000 mg 200 mL/hr over 60 Minutes Intravenous Every 12 hours 07/11/21 0923 07/11/21 1257   07/11/21 1345  DAPTOmycin (CUBICIN) 750 mg in sodium chloride 0.9 % IVPB  Status:  Discontinued        8 mg/kg  95.5 kg (Adjusted) 130 mL/hr over 30 Minutes Intravenous Daily 07/11/21 1257 07/22/21 0909   07/11/21 1345  cefTRIAXone (ROCEPHIN) 2 g in sodium chloride 0.9 % 100 mL IVPB  Status:  Discontinued        2 g 200 mL/hr over 30 Minutes Intravenous Every 24 hours 07/11/21 1257 07/17/21 1745   07/10/21 1400  vancomycin (VANCOREADY) IVPB 1250 mg/250 mL  Status:  Discontinued        1,250 mg 166.7 mL/hr over 90 Minutes Intravenous Every 12 hours 07/09/21 2249 07/11/21 0923   07/09/21 2230  piperacillin-tazobactam (ZOSYN) IVPB 3.375 g  Status:  Discontinued        3.375 g 12.5 mL/hr over 240 Minutes Intravenous Every 8 hours 07/09/21 2137 07/11/21 1257       Subjective: Patient seen and examined at the bedside this morning.  Hemodynamically stable.  Lying on the bed.,  Overall comfortable.  Still very reluctant to move around.  States on the bed most of the time.  I encouraged him to sit in the chair/ambulate  Objective: Vitals:   07/22/21 1930 07/22/21 2310 07/23/21 0333 07/23/21 0754  BP: (!) 145/93 133/79 131/86 (!) 133/91  Pulse: 93 96 98 95  Resp: 17 17 19 17   Temp: 98 F (36.7 C) 98 F (36.7 C) 98.5 F (36.9 C) 97.9 F (36.6 C)   TempSrc: Oral Oral Oral Oral  SpO2: 100% 99% 97% 99%  Weight:      Height:        Intake/Output Summary (Last 24 hours) at 07/23/2021 1113 Last data filed at 07/23/2021 0600 Gross per 24 hour  Intake 2375.7 ml  Output 1280 ml  Net 1095.7 ml   Filed Weights   07/09/21 2200  Weight: 125.9 kg    Examination:  General exam: Overall comfortable, not in distress, morbidly obese HEENT: PERRL Respiratory system:  no wheezes or crackles  Cardiovascular system: S1 & S2 heard, RRR.  Gastrointestinal system: Abdomen is nondistended, soft and nontender. Central nervous system: Alert and oriented Extremities: No edema, no clubbing ,no cyanosis Skin: No rashes, no ulcers,no icterus   MSK: Sutures on the back  Data Reviewed: I have personally reviewed following labs and imaging studies  CBC: Recent Labs  Lab 07/17/21 0158 07/17/21 0945 07/17/21 2028 07/18/21 0539 07/19/21 0818 07/20/21 0238 07/21/21 0152  WBC 11.3*  --  17.7* 14.8* 11.3* 10.4 10.9*  NEUTROABS 6.8  --   --  11.3* 7.2 6.0  --   HGB 14.2   < >  9.5* 9.7* 9.7* 8.6* 8.8*  HCT 42.3   < > 29.3* 29.7* 31.6* 26.8* 27.0*  MCV 90.8  --  92.4 93.4 99.4 95.0 94.1  PLT 284  --  160 178 144* 181 204   < > = values in this interval not displayed.   Basic Metabolic Panel: Recent Labs  Lab 07/17/21 2028 07/18/21 0539 07/19/21 0818 07/21/21 0152 07/22/21 0407  NA 137 137 139 140 135  K 3.8 4.5 4.0 3.3* 3.6  CL 109 108 108 108 106  CO2 19* 19* 20* 24 26  GLUCOSE 159* 105* 109* 95 108*  BUN <5*  CREATININE 1.02 0.93 0.97 0.85 0.94  CALCIUM 8.5* 8.6* 8.8* 8.4* 8.5*     Recent Results (from the past 240 hour(s))  Aerobic/Anaerobic Culture w Gram Stain (surgical/deep wound)     Status: None   Collection Time: 07/17/21 10:28 AM   Specimen: Soft Tissue, Other  Result Value Ref Range Status   Specimen Description TISSUE  Final   Special Requests THRACIC EIGHT NINE JOINT FOR CULTURE  Final   Gram Stain NO WBC  SEEN NO ORGANISMS SEEN   Final   Culture   Final    No growth aerobically or anaerobically. Performed at [facility], [address], [place], [place] [address]    Report Status 07/22/2021 FINAL  Final  Aerobic/Anaerobic Culture w Gram Stain (surgical/deep wound)     Status: None   Collection Time: 07/17/21 12:27 PM   Specimen: Soft Tissue, Other  Result Value Ref Range Status   Specimen Description WOUND  Final   Special Requests THORAIC EIGHT NINE DISC SPACE WOUND CULTURE SPEC B  Final   Gram Stain NO WBC SEEN NO ORGANISMS SEEN   Final   Culture   Final    No growth aerobically or anaerobically. Performed at [facility], [address] [place], [place] [address]    Report Status 07/22/2021 FINAL  Final  MRSA Next Gen by PCR, Nasal     Status: None   Collection Time: 07/17/21  6:28 PM   Specimen: Nasal Mucosa; Nasal Swab  Result Value Ref Range Status   MRSA by PCR Next Gen NOT DETECTED NOT DETECTED Final    Comment: (NOTE) The GeneXpert MRSA Assay (FDA approved for NASAL specimens only), is one component of a comprehensive MRSA colonization surveillance program. It is not intended to diagnose MRSA infection nor to guide or monitor treatment for MRSA infections. Test performance is not FDA approved in patients less than 13 years old. Performed at [facility], [address] [place], [place] [address]      Radiology Studies: [place] EKG SITE RITE  Result Date: 07/22/2021 If [facility] image not attached, placement could not be confirmed due to current cardiac rhythm.   Scheduled Meds:  sodium chloride   Intravenous Once   acetaminophen  1,000 mg Oral Q6H   Chlorhexidine Gluconate Cloth  6 each Topical Daily   feeding supplement  237 mL Oral BID BM   gabapentin  300 mg Oral TID   heparin injection (subcutaneous)  5,000 Units Subcutaneous Q8H   insulin aspart  2-6 Units Subcutaneous Q4H   multivitamin with minerals  1 tablet Oral Daily   ondansetron  (ZOFRAN) IV  4 mg Intravenous Q8H   pantoprazole  40 mg Oral Daily   polyethylene glycol  17 g Oral BID   sodium chloride flush  10-40 mL Intracatheter  Q12H   sodium chloride flush  3 mL Intravenous Q12H   Continuous Infusions:  sodium chloride Stopped (07/17/21 2225)   cefTRIAXone (ROCEPHIN)  IV Stopped (07/22/21 2007)   lactated ringers 50 mL/hr at 07/23/21 0600   methocarbamol (ROBAXIN) IV Stopped (07/22/21 0050)     LOS: 14 days   Burnadette Pop, MD [facility] P5/24/2023, 11:13 AM

## 2021-07-23 NOTE — Progress Notes (Signed)
Peripherally Inserted Central Catheter Placement  The IV Nurse has discussed with the patient and/or persons authorized to consent for the patient, the purpose of this procedure and the potential benefits and risks involved with this procedure.  The benefits include less needle sticks, lab draws from the catheter, and the patient may be discharged home with the catheter. Risks include, but not limited to, infection, bleeding, blood clot (thrombus formation), and puncture of an artery; nerve damage and irregular heartbeat and possibility to perform a PICC exchange if needed/ordered by physician.  Alternatives to this procedure were also discussed.  Bard Power PICC patient education guide, fact sheet on infection prevention and patient information card has been provided to patient /or left at bedside.    PICC Placement Documentation  PICC Single Lumen 07/23/21 Right Brachial 40 cm 0 cm (Active)  Indication for Insertion or Continuance of Line Prolonged intravenous therapies 07/23/21 0930  Exposed Catheter (cm) 0 cm 07/23/21 0930  Site Assessment Clean, Dry, Intact 07/23/21 0930  Line Status Flushed;Saline locked;Blood return noted 07/23/21 0930  Dressing Type Transparent;Securing device 07/23/21 0930  Dressing Status Antimicrobial disc in place 07/23/21 0930  Dressing Intervention New dressing;Other (Comment) 07/23/21 0930  Dressing Change Due 07/30/21 07/23/21 0930       Reginia Forts Albarece 07/23/2021, 9:31 AM

## 2021-07-23 NOTE — Progress Notes (Signed)
Nutrition Follow-up  DOCUMENTATION CODES:   Not applicable  INTERVENTION:  ***   NUTRITION DIAGNOSIS:   Increased nutrient needs related to post-op healing as evidenced by estimated needs.  ***  GOAL:   Patient will meet greater than or equal to 90% of their needs  ***  MONITOR:   Weight trends, Labs, Supplement acceptance, PO intake  REASON FOR ASSESSMENT:   Malnutrition Screening Tool    ASSESSMENT:   61 y.o. male presented to the ED with increased pain. Pt admitted 3/17-3/22/23 and 4/12-07/02/21 for ongoing discitis and osteomyelitis. PMH includes HTN. Pt admitted with osteomyelitis and intermittent nausea and vomiting.  05/18 - s/p T5-11 fusion and T8-9 corpectomy 05/19 - clear liquids 05/21 - dysphagia 3 diet  Meal Completion: 0-100%  Medications reviewed and include: Ensure Enlive BID, SSI, MVI with minerals, protonix, miralax, IV abx  Labs reviewed: *** CBG's: ***  NUTRITION - FOCUSED PHYSICAL EXAM:  {RD Focused Exam List:21252}  Diet Order:   Diet Order             DIET DYS 3 Room service appropriate? Yes; Fluid consistency: Thin  Diet effective now                   EDUCATION NEEDS:   No education needs have been identified at this time  Skin:  Skin Assessment: Skin Integrity Issues: Skin Integrity Issues:: Stage II Stage II: coccyx  Last BM:  5/17  Height:   Ht Readings from Last 1 Encounters:  07/09/21 5\' 11"  (1.803 m)    Weight:   Wt Readings from Last 1 Encounters:  07/09/21 125.9 kg    Ideal Body Weight:  78.2 kg  BMI:  Body mass index is 38.71 kg/m.  Estimated Nutritional Needs:   Kcal:  2300-2500  Protein:  115-130 grams  Fluid:  >/= 2 L    09/08/21, MS, RD, LDN Inpatient Clinical Dietitian Please see AMiON for contact information.

## 2021-07-23 NOTE — Progress Notes (Signed)
  Mobility Specialist Criteria Algorithm Info.   07/23/21 1526  Mobility  Activity Refused mobility    Despite max encouragement patient declined for unspecified reasons. Will f/u as time permits.   07/23/2021 3:26 PM  [place] Teran Daughenbaugh, CMS, BS EXP Acute Rehabilitation Services  Phone:[phone] Office: [phone]

## 2021-07-23 NOTE — Progress Notes (Signed)
Patient said he was in pain. I crushed pain medicine and put in an [place] ice, the way he wanted. However, patient said he could only take half of what I crushed up. He is refusing most of his medications and is refusing to move.

## 2021-07-23 NOTE — Progress Notes (Signed)
   Providing Compassionate, Quality Care - Together  NEUROSURGERY PROGRESS NOTE   S: No issues overnight. Complains of appropriate soreness, was very happy yesterday once he was able to get up and OOB, seems again to be significantly lacking motivation today  O: EXAM:  BP (!) 132/96 (BP Location: Left Arm)   Pulse 98   Temp 98 F (36.7 C) (Oral)   Resp 16   Ht 5\' 11"  (1.803 m)   Wt 125.9 kg   SpO2 98%   BMI 38.71 kg/m   Awake, alert, oriented x3 PERRL Speech fluent, appropriate  CNs grossly intact  MAE equally  ASSESSMENT:  61 y.o. male with   T8-9 Osteodiscitis  S/p T5-11 fusion, T8-9 corpectomy  PLAN: - pt/ot - wean off IV pain meds -cont aggressive PT, needs significant amount of motivation    Thank you for allowing me to participate in this patient's care.  Please do not hesitate to call with questions or concerns.   48, DO Neurosurgeon [facility] Cell: [phone]

## 2021-07-23 NOTE — TOC Progression Note (Signed)
Transition of Care [facility]) - Progression Note    Patient Details  Name: Michael [name] MRN: [id number] Date of Birth: December 01, 1960  Transition of Care [facility]) CM/SW Contact  Carley Hammed, [place] Phone Number: 07/23/2021, 12:44 PM  Clinical Narrative:    CSW notified pt is medically ready to DC and MD is asking what the plan is, as spouse is not answering the phone. CSW and CM spoke with pt yesterday and he is still refusing SNF, and CSW updated MD that pt is still expressing passive suicidal thoughts.  CSW reached out to spouse who noted that pt has been messaging her suicidal texts and she is speaking to his PCP about it. She also noted that she feels pt did not have a normal back surgery, he had a difficult one and the hospital is asking him to do things he cannot do. CSW advised that MD was trying to contact her. CSW notified by PT that pt is self limiting at this time. CSW advised by SNF they could not accept back for non compliance. Concern that insurance won't approve pt as he has many refusals documented. TOC will continue to follow for DC needs.   Expected Discharge Plan: Home w Home Health Services Barriers to Discharge: Continued Medical Work up  Expected Discharge Plan and Services Expected Discharge Plan: Home w Home Health Services     Post Acute Care Choice: Home Health Living arrangements for the past 2 months: Single Family Home Expected Discharge Date: 07/15/21                                     Social Determinants of Health (SDOH) Interventions    Readmission Risk Interventions     View : No data to display.

## 2021-07-24 DIAGNOSIS — D62 Acute posthemorrhagic anemia: Secondary | ICD-10-CM | POA: Diagnosis not present

## 2021-07-24 DIAGNOSIS — G4700 Insomnia, unspecified: Secondary | ICD-10-CM | POA: Diagnosis not present

## 2021-07-24 DIAGNOSIS — I10 Essential (primary) hypertension: Secondary | ICD-10-CM | POA: Diagnosis present

## 2021-07-24 DIAGNOSIS — M40204 Unspecified kyphosis, thoracic region: Secondary | ICD-10-CM | POA: Diagnosis present

## 2021-07-24 DIAGNOSIS — M4804 Spinal stenosis, thoracic region: Secondary | ICD-10-CM | POA: Diagnosis present

## 2021-07-24 DIAGNOSIS — L89152 Pressure ulcer of sacral region, stage 2: Secondary | ICD-10-CM | POA: Diagnosis present

## 2021-07-24 DIAGNOSIS — M4654 Other infective spondylopathies, thoracic region: Secondary | ICD-10-CM | POA: Diagnosis present

## 2021-07-24 DIAGNOSIS — E860 Dehydration: Secondary | ICD-10-CM | POA: Diagnosis not present

## 2021-07-24 DIAGNOSIS — M869 Osteomyelitis, unspecified: Secondary | ICD-10-CM | POA: Diagnosis present

## 2021-07-24 DIAGNOSIS — E872 Acidosis, unspecified: Secondary | ICD-10-CM | POA: Diagnosis not present

## 2021-07-24 DIAGNOSIS — R112 Nausea with vomiting, unspecified: Secondary | ICD-10-CM | POA: Diagnosis present

## 2021-07-24 DIAGNOSIS — M4644 Discitis, unspecified, thoracic region: Secondary | ICD-10-CM | POA: Diagnosis present

## 2021-07-24 DIAGNOSIS — B370 Candidal stomatitis: Secondary | ICD-10-CM | POA: Diagnosis not present

## 2021-07-24 DIAGNOSIS — F32A Depression, unspecified: Secondary | ICD-10-CM | POA: Diagnosis not present

## 2021-07-24 DIAGNOSIS — R45851 Suicidal ideations: Secondary | ICD-10-CM | POA: Diagnosis not present

## 2021-07-24 DIAGNOSIS — E876 Hypokalemia: Secondary | ICD-10-CM | POA: Diagnosis not present

## 2021-07-24 DIAGNOSIS — N179 Acute kidney failure, unspecified: Secondary | ICD-10-CM | POA: Diagnosis not present

## 2021-07-24 DIAGNOSIS — D696 Thrombocytopenia, unspecified: Secondary | ICD-10-CM | POA: Diagnosis not present

## 2021-07-24 DIAGNOSIS — I9581 Postprocedural hypotension: Secondary | ICD-10-CM | POA: Diagnosis not present

## 2021-07-24 DIAGNOSIS — F430 Acute stress reaction: Secondary | ICD-10-CM | POA: Diagnosis present

## 2021-07-24 DIAGNOSIS — K219 Gastro-esophageal reflux disease without esophagitis: Secondary | ICD-10-CM | POA: Diagnosis present

## 2021-07-24 DIAGNOSIS — R17 Unspecified jaundice: Secondary | ICD-10-CM | POA: Diagnosis not present

## 2021-07-24 DIAGNOSIS — M4624 Osteomyelitis of vertebra, thoracic region: Secondary | ICD-10-CM | POA: Diagnosis present

## 2021-07-24 LAB — BASIC METABOLIC PANEL
Anion gap: 7 (ref 5–15)
BUN: 6 mg/dL (ref 6–20)
CO2: 26 mmol/L (ref 22–32)
Calcium: 8.7 mg/dL — ABNORMAL LOW (ref 8.9–10.3)
Chloride: 105 mmol/L (ref 98–111)
Creatinine, Ser: 0.92 mg/dL (ref 0.61–1.24)
GFR, Estimated: >60 mL/min (ref >60)
Glucose, Bld: 104 mg/dL — ABNORMAL HIGH (ref 70–99)
Potassium: 3.5 mmol/L (ref 3.5–5.1)
Sodium: 138 mmol/L (ref 135–145)

## 2021-07-24 LAB — CBC
HCT: 27.4 % — ABNORMAL LOW (ref 39.0–52.0)
Hemoglobin: 8.9 g/dL — ABNORMAL LOW (ref 13.0–17.0)
MCH: 30.9 pg (ref 26.0–34.0)
MCHC: 32.5 g/dL (ref 30.0–36.0)
MCV: 95.1 fL (ref 80.0–100.0)
Platelets: 295 10*3/uL (ref 150–400)
RBC: 2.88 MIL/uL — ABNORMAL LOW (ref 4.22–5.81)
RDW: 18.8 % — ABNORMAL HIGH (ref 11.5–15.5)
WBC: 9.3 10*3/uL (ref 4.0–10.5)
nRBC: 0 % (ref 0.0–0.2)

## 2021-07-24 LAB — GLUCOSE, CAPILLARY
Glucose-Capillary: 118 mg/dL — ABNORMAL HIGH (ref 70–99)
Glucose-Capillary: 120 mg/dL — ABNORMAL HIGH (ref 70–99)
Glucose-Capillary: 174 mg/dL — ABNORMAL HIGH (ref 70–99)
Glucose-Capillary: 99 mg/dL (ref 70–99)
Glucose-Capillary: 148 mg/dL — ABNORMAL HIGH (ref 70–99)
Glucose-Capillary: 111 mg/dL — ABNORMAL HIGH (ref 70–99)

## 2021-07-24 MED ORDER — ENSURE ENLIVE PO LIQD
237.0000 mL | Freq: Three times a day (TID) | ORAL | Status: DC
  Administered 2021-07-24 – 2021-07-30 (×14): 237 mL via ORAL

## 2021-07-24 NOTE — Progress Notes (Signed)
PROGRESS NOTE    Michael PlumberJohn R [name]  [id number] DOB: June 24, 1960 DOA: 07/09/2021 PCP: Michael [name], Michael S, PA   Brief Narrative:    Patient is a 61 year old male with history of T8-9 discitis/osteomyelitis, E. coli bacteremia presented to [facility] with complaint of pain.  CT imaging again showed T8-T9 osteomyelitis, discitis, now with evidence of bony erosion, progression since prior.  [facility]  Patient was started on broad-spectrum antibiotics, ID consulted.  Patient underwent T5-T11 fusion, T8-T9 corpectomy by neurosurgery and was transferred to ICU for postop stay.  Hospital course remarkable for poor mobility.  PT/OT recommending skilled nursing facility on discharge.  Medically stable for discharge and TOC now assisting with SNF placement.  Assessment & Plan:   Principal Problem:   Osteomyelitis of thoracic region [facility](HCC) Active Problems:   Thoracic discitis   Poor dentition   Nausea and vomiting   Essential hypertension  Assessment and Plan:   Thoracic vertebral osteomyelitis:History of T8-9 discitis/osteomyelitis, E. coli bacteremia [facility] with [facility] of pain.  CT imaging again showed T8-T9 osteomyelitis, discitis, now with evidence of bony erosion, progression since prior.Patient was started on broad-spectrum antibiotics, ID consulted.  Patient underwent T5-T11 fusion, T8-T9 corpectomy by neurosurgery.  Neurosurgery recommends TLSO brace when out of bed. Blood cultures no growth till date. Infectious disease recommended ceftriaxone till 7/6. [name]/P  PICC line placement. Continue pain management, supportive care.  Currently hemodynamically stable.   Acute blood loss anemia: Currently hemoglobin stable in the range of 8.  He was transfused with a unit of PRBC here during this hospitalization .currently hemoglobin stable   Medical noncompliance/suicidal ideation: Seen by  psych during this hospitalization, cleared, no need of inpatient psychiatric admission   Prediabetes: Hemoglobin A1c of 6.1.  Currently on sliding scale insulin.  Does not take any medications at home.   Oral candidiasis: Treated with nystatin   Hypertension: Currently blood pressure stable without any medications, only on as needed medications for now.  Takes lisinopril-hydrochlorothiazide at home   AKI: Resolved   GERD/chronic nausea/vomiting: Continue daily PPI.  He was seen by gastroenterology on April when he declined EGD.   Debility/deconditioning: Patient seen by PT/OT and recommended skilled nursing discharge.  Poor mobility.  Needs lots of encouragement.  Patient reluctant on skilled nursing facility placement, wants to go home with home health but says he is not ready yet.  [facility]   Obesity: BMI of 38      DVT prophylaxis: Heparin Code Status: Full Family Communication: Discussed with wife on phone 5/25 Disposition Plan:  Status is: Inpatient Remains inpatient appropriate because:    Nutritional Assessment:  The patient'[name] BMI is: Body mass index is 38.71 kg/m.Marland Kitchen.  Seen by dietician.  I agree with the assessment and plan as outlined below:  Nutrition Status: Nutrition Problem: Increased nutrient needs Etiology: post-op healing Signs/Symptoms: estimated needs Interventions: Ensure Enlive (each supplement provides 350kcal and 20 grams of protein), MVI  .    Skin Assessment:  I have examined the patient'[name] skin and I agree with the wound assessment as performed by the wound care RN as outlined below:     Consultants:  None  Procedures:  None  Antimicrobials:     Subjective: Patient seen and evaluated today and continues to have ongoing pain concerns, but refuses to move and get out of his bed.  No acute overnight events noted.  Objective:  Vitals:   07/23/21 1934 07/23/21 2320 07/24/21 0328 07/24/21 0747  BP: (!) 143/96 (!)  139/92 134/87 138/87  Pulse: 99 99 94 87  Resp: 20 17 19 17   Temp: 98.2 F (36.8 C) 98.3 F (36.8 C) 97.8 F (36.6 C) (!) 97.5 F (36.4 C)  TempSrc: Oral Oral Oral   SpO2: 97% 96% 95% 97%  Weight:      Height:        Intake/Output Summary (Last 24 hours) at 07/24/2021 1038 Last data filed at 07/24/2021 0331 Gross per 24 hour  Intake --  Output 350 ml  Net -350 ml   Filed Weights   07/09/21 2200  Weight: 125.9 kg    Examination:  General exam: Appears calm and comfortable  Respiratory system: Clear to auscultation. Respiratory effort normal. Cardiovascular system: S1 & S2 heard, RRR.  Gastrointestinal system: Abdomen is soft Central nervous system: Alert and awake Extremities: No edema Skin: No significant lesions noted Psychiatry: Flat affect.    Data Reviewed: I have personally reviewed following labs and imaging studies  CBC: Recent Labs  Lab 07/18/21 0539 07/19/21 0818 07/20/21 0238 07/21/21 0152 07/24/21 0435  WBC 14.8* 11.3* 10.4 10.9* 9.3  NEUTROABS 11.3* 7.2 6.0  --   --   HGB 9.7* 9.7* 8.6* 8.8* 8.9*  HCT 29.7* 31.6* 26.8* 27.0* 27.4*  MCV 93.4 99.4 95.0 94.1 95.1  PLT 178 144* 181 204 295   Basic Metabolic Panel: Recent Labs  Lab 07/18/21 0539 07/19/21 0818 07/21/21 0152 07/22/21 0407 07/24/21 0435  NA 137 139 140 135 138  K 4.5 4.0 3.3* 3.6 3.5  CL 108 108 108 106 105  CO2 19* 20* 24 26 26   GLUCOSE 105* 109* 95 108* 104*  BUN 9 8 6  <5* 6  CREATININE 0.93 0.97 0.85 0.94 0.92  CALCIUM 8.6* 8.8* 8.4* 8.5* 8.7*   GFR: Estimated Creatinine Clearance: 116.8 mL/min (by C-G formula based on SCr of 0.92 mg/dL). Liver Function Tests: Recent Labs  Lab 07/18/21 0539 07/19/21 0818 07/21/21 0152  AST 30 23 19   ALT 23 17 18   ALKPHOS 59 61 65  BILITOT 1.5* 0.7 0.8  PROT 5.4* 5.5* 5.3*  ALBUMIN 3.4* 3.0* 2.7*   No results for input([name]): LIPASE, AMYLASE in the last 168 hours. No results for input([name]): AMMONIA in the last 168  hours. Coagulation Profile: Recent Labs  Lab 07/17/21 2028  INR 1.3*   Cardiac Enzymes: Recent Labs  Lab 07/18/21 0539 07/20/21 0238  CKTOTAL 353 184   BNP (last 3 results) No results for input([name]): PROBNP in the last 8760 hours. HbA1C: No results for input([name]): HGBA1C in the last 72 hours. CBG: Recent Labs  Lab 07/23/21 1530 07/23/21 1947 07/23/21 2319 07/24/21 0331 07/24/21 0745  GLUCAP 128* 135* 123* 118* 120*   Lipid Profile: No results for input([name]): CHOL, HDL, LDLCALC, TRIG, CHOLHDL, LDLDIRECT in the last 72 hours. Thyroid Function Tests: No results for input([name]): TSH, T4TOTAL, FREET4, T3FREE, THYROIDAB in the last 72 hours. Anemia Panel: No results for input([name]): VITAMINB12, FOLATE, FERRITIN, TIBC, IRON, RETICCTPCT in the last 72 hours. Sepsis Labs: No results for input([name]): PROCALCITON, LATICACIDVEN in the last 168 hours.  Recent Results (from the past 240 hour([name]))  Aerobic/Anaerobic Culture w Gram Stain (surgical/deep wound)     Status: None   Collection Time: 07/17/21 10:28 AM   Specimen: Soft Tissue, Other  Result Value Ref Range Status   Specimen Description TISSUE  Final   Special Requests THRACIC EIGHT  NINE JOINT FOR CULTURE  Final   Gram Stain NO WBC SEEN NO ORGANISMS SEEN   Final   Culture   Final    No growth aerobically or anaerobically. Performed at [facility], [address], [place], [place] [address]    Report Status 07/22/2021 FINAL  Final  Aerobic/Anaerobic Culture w Gram Stain (surgical/deep wound)     Status: None   Collection Time: 07/17/21 12:27 PM   Specimen: Soft Tissue, Other  Result Value Ref Range Status   Specimen Description WOUND  Final   Special Requests THORAIC EIGHT NINE DISC SPACE WOUND CULTURE SPEC B  Final   Gram Stain NO WBC SEEN NO ORGANISMS SEEN   Final   Culture   Final    No growth aerobically or anaerobically. Performed at [facility], [address] [place], [place] [address]    Report Status  07/22/2021 FINAL  Final  MRSA Next Gen by PCR, Nasal     Status: None   Collection Time: 07/17/21  6:28 PM   Specimen: Nasal Mucosa; Nasal Swab  Result Value Ref Range Status   MRSA by PCR Next Gen NOT DETECTED NOT DETECTED Final    Comment: (NOTE) The GeneXpert MRSA Assay (FDA approved for NASAL specimens only), is one component of a comprehensive MRSA colonization surveillance program. It is not intended to diagnose MRSA infection nor to guide or monitor treatment for MRSA infections. Test performance is not FDA approved in patients less than 19 years old. Performed at [facility], [address], [place], [place] [address]          Radiology Studies: [place] EKG SITE RITE  Result Date: 07/22/2021 If [facility] image not attached, placement could not be confirmed due to current cardiac rhythm.       Scheduled Meds:  sodium chloride   Intravenous Once   acetaminophen  1,000 mg Oral Q6H   Chlorhexidine Gluconate Cloth  6 each Topical Daily   feeding supplement  237 mL Oral BID BM   gabapentin  300 mg Oral TID   heparin injection (subcutaneous)  5,000 Units Subcutaneous Q8H   insulin aspart  2-6 Units Subcutaneous Q4H   multivitamin with minerals  1 tablet Oral Daily   pantoprazole  40 mg Oral Daily   polyethylene glycol  17 g Oral BID   sodium chloride flush  10-40 mL Intracatheter Q12H   sodium chloride flush  3 mL Intravenous Q12H   Continuous Infusions:  sodium chloride Stopped (07/17/21 2225)   cefTRIAXone (ROCEPHIN)  IV 2 g (07/23/21 1737)   methocarbamol (ROBAXIN) IV Stopped (07/22/21 0050)     LOS: 15 days    Time spent: 35 minutes    Acen Craun Hoover Brunette, DO [facility]  If 7PM-7AM, please contact night-coverage [email or web address] 07/24/2021, 10:38 AM

## 2021-07-24 NOTE — Progress Notes (Signed)
Occupational Therapy Treatment Patient Details Name: Michael [name] MRN: [id number] DOB: [birth year] Today's Date: 07/24/2021   History of present illness 61 y/o man presents to [facility] hospital 07/09/2021 with multiple recent admissions of progressive discititis and osteomyelitis, now with worsening pain. CT revealed progressive osteomyelitis/discititis at T8-9 compared to recent studies with bony erosion and possible abscesses. Pt underwent T5-11 fusion with T8-9 corpectomy on 5/18. PMH includes HTN, obesity.   OT comments  Pt seen in conjunction with PT to progress functional mobility and optimize participation. Pt was able to mobilize OOB with Rw and MIN A +2, pt even able to take a few steps from recliner with RW and MIN A +2. Pt continues to be self limiting waiting to "wait" and c/o that pt was too weak however with steady/strong encouragement pt able to participate. Pt would continue to benefit from skilled occupational therapy while admitted and after d/c to address the below listed limitations in order to improve overall functional mobility and facilitate independence with BADL participation. DC plan remains appropriate, will follow acutely per POC.      Recommendations for follow up therapy are one component of a multi-disciplinary discharge planning process, led by the attending physician.  Recommendations may be updated based on patient status, additional functional criteria and insurance authorization.    Follow Up Recommendations  Skilled nursing-short term rehab (<3 hours/day)    Assistance Recommended at Discharge Frequent or constant Supervision/Assistance  Patient can return home with the following  Two people to help with walking and/or transfers;Two people to help with bathing/dressing/bathroom;Direct supervision/assist for medications management;Direct supervision/assist for financial management;Assist for transportation;Help with stairs or ramp for entrance   Equipment  Recommendations  Other (comment)    Recommendations for Other Services      Precautions / Restrictions Precautions Precautions: Fall;Back Precaution Booklet Issued: No Required Braces or Orthoses: Spinal Brace Spinal Brace: Thoracolumbosacral orthotic;Applied in sitting position Restrictions Weight Bearing Restrictions: Yes       Mobility Bed Mobility Overal bed mobility: Needs Assistance Bed Mobility: Sidelying to Sit   Sidelying to sit: Min assist, HOB elevated       General bed mobility comments: Verbal cues for technique to follow back precautions. Assist to elevate trunk into sitting. Use of rail.    Transfers Overall transfer level: Needs assistance Equipment used: Rolling walker (2 wheels) Transfers: Sit to/from Stand, Bed to chair/wheelchair/BSC Sit to Stand: Min assist, +2 physical assistance     Step pivot transfers: +2 physical assistance, Min assist     General transfer comment: Assist to initiate power up into standing and for stability.     Balance Overall balance assessment: Needs assistance Sitting-balance support: Single extremity supported, Feet supported Sitting balance-Leahy Scale: Fair Sitting balance - Comments: UE support for pain   Standing balance support: Bilateral upper extremity supported, During functional activity Standing balance-Leahy Scale: Poor Standing balance comment: walker and min guard for static standing                           ADL either performed or assessed with clinical judgement   ADL Overall ADL's : Needs assistance/impaired                 Upper Body Dressing : Maximal assistance;Sitting Upper Body Dressing Details (indicate cue type and reason): to don brace     Toilet Transfer: Minimal assistance;+2 for safety/equipment;+2 for physical assistance;Rolling walker (2 wheels) Toilet Transfer Details (indicate cue type  and reason): pt able to complete simulated toilet transfer from EOB>recliner  with Rw and MIN A +2, pt needs initial assist to stand but once standing progresses very well         Functional mobility during ADLs: Minimal assistance;+2 for physical assistance;+2 for safety/equipment;Rolling walker (2 wheels) General ADL Comments: emphasis on progressing functional mobility and OOB tolerance    Extremity/Trunk Assessment Upper Extremity Assessment Upper Extremity Assessment: Generalized weakness   Lower Extremity Assessment Lower Extremity Assessment: Defer to PT evaluation   Cervical / Trunk Assessment Cervical / Trunk Assessment: Other exceptions;Back Surgery (increased body habitus)    Vision Baseline Vision/History: 1 Wears glasses Ability to See in Adequate Light: 0 Adequate Patient Visual Report: No change from baseline Vision Assessment?: No apparent visual deficits   Perception Perception Perception: Not tested   Praxis Praxis Praxis: Not tested    Cognition Arousal/Alertness: Awake/alert Behavior During Therapy: Anxious Overall Cognitive Status: Within Functional Limits for tasks assessed (seems to be baseline)                                 General Comments: pt continues to self limiting, perseverating on being weak and wanting more time, anxious with all mobility but mobilizes very well        Exercises      Shoulder Instructions       General Comments VSS on RA, wife present during session    Pertinent Vitals/ Pain       Pain Assessment Pain Assessment: Faces Faces Pain Scale: Hurts even more Pain Location: back, rt shoulder Pain Descriptors / Indicators: Aching, Pins and needles Pain Intervention(s): Limited activity within patient's tolerance, Monitored during session, Premedicated before session, Repositioned  Home Living                                          Prior Functioning/Environment              Frequency  Min 2X/week        Progress Toward Goals  OT Goals(current  goals can now be found in the care plan section)  Progress towards OT goals: Progressing toward goals  Acute Rehab OT Goals OT Goal Formulation: With patient Time For Goal Achievement: 08/01/21 Potential to Achieve Goals: Fair  Plan Discharge plan remains appropriate;Frequency remains appropriate    Co-evaluation      Reason for Co-Treatment: Necessary to address cognition/behavior during functional activity;For patient/therapist safety PT goals addressed during session: Mobility/safety with mobility        AM-PAC OT "6 Clicks" Daily Activity     Outcome Measure   Help from another person eating meals?: None Help from another person taking care of personal grooming?: None Help from another person toileting, which includes using toliet, bedpan, or urinal?: A Lot Help from another person bathing (including washing, rinsing, drying)?: A Lot Help from another person to put on and taking off regular upper body clothing?: A Lot Help from another person to put on and taking off regular lower body clothing?: Total 6 Click Score: 15    End of Session Equipment Utilized During Treatment: Gait belt;Rolling walker (2 wheels);Back brace  OT Visit Diagnosis: Unsteadiness on feet (R26.81);Other abnormalities of gait and mobility (R26.89);Muscle weakness (generalized) (M62.81);Pain Pain - part of body:  (back; R shoulder)  Activity Tolerance Patient tolerated treatment well;Other (comment) (self limiting at times)   Patient Left in chair;with call bell/phone within reach;with chair alarm set   Nurse Communication Mobility status;Other (comment) (stedy for back to bed)        Time: [id number] OT Time Calculation (min): 25 min  Charges: OT General Charges $OT Visit: 1 Visit OT Treatments $Therapeutic Activity: 8-22 mins  Harley Alto., COTA/L Acute Rehabilitation Services [phone]   Precious Haws 07/24/2021, 1:01 PM

## 2021-07-24 NOTE — NC FL2 (Signed)
[place] LEVEL OF CARE SCREENING TOOL     IDENTIFICATION  Patient Name: Michael [name] Birthdate: [birth year] Sex: male Admission Date (Current Location): 07/09/2021  [facility] and [place] Number:  Herbalist and Address:  [facility] [place]. [facility], [place] [address] [place], [place] [address]      Provider Number: [id number]  Attending Physician Name and Address:  Rodena Goldmann, DO  Relative Name and Phone Number:  Cataldo Melillo [phone]    Current Level of Care: Hospital Recommended Level of Care: [place] Prior Approval Number:    Date Approved/Denied:   PASRR Number: [id number]  Discharge Plan: SNF    Current Diagnoses: Patient Active Problem List   Diagnosis Date Noted   Osteomyelitis of thoracic region [facility]) 07/09/2021   Nausea and vomiting    Constipation    Pressure injury of skin 06/23/2021   Thoracic discitis 06/12/2021   Class 1 obesity due to excess calories with body mass index (BMI) of 34.0 to 34.9 in adult 06/12/2021   Oral thrush 06/12/2021   Poor dentition 06/12/2021   Gram-negative bacteremia 05/17/2021   Essential hypertension 05/16/2021   Obesity, Class III, BMI 40-49.9 (morbid obesity) ([place]) 05/16/2021    Orientation RESPIRATION BLADDER Height & Weight     Self, Time, Situation, Place  Normal Continent, External catheter Weight: 277 lb 9 oz (125.9 kg) Height:  5\' 11"  (180.3 cm)  BEHAVIORAL SYMPTOMS/MOOD NEUROLOGICAL BOWEL NUTRITION STATUS      Continent Diet (See DC summary)  AMBULATORY STATUS COMMUNICATION OF NEEDS Skin   Extensive Assist Verbally Surgical wounds, Skin abrasions (Back Incision, Skin tear on Buttock)                       Personal Care Assistance Level of Assistance  Bathing, Feeding, Dressing Bathing Assistance: Maximum assistance Feeding assistance: Maximum assistance Dressing Assistance: Maximum assistance     Functional Limitations Info  Sight, Hearing,  Speech Sight Info: Impaired Hearing Info: Adequate Speech Info: Adequate    SPECIAL CARE FACTORS FREQUENCY  PT (By licensed PT), OT (By licensed OT)     PT Frequency: 5x week OT Frequency: 5x week            Contractures Contractures Info: Not present    Additional Factors Info  Code Status, Allergies, Insulin Sliding Scale Code Status Info: Full Allergies Info: NKA   Insulin Sliding Scale Info: insulin aspart (novoLOG) injection 2-6 Units every 4 hours       Current Medications (07/24/2021):  This is the current hospital active medication list Current Facility-Administered Medications  Medication Dose Route Frequency Provider Last Rate Last Admin   0.9 %  sodium chloride infusion (Manually program via Guardrails IV Fluids)   Intravenous Once Dawley, Troy C, DO       0.9 %  sodium chloride infusion  250 mL Intravenous Continuous Dawley, Troy C, DO   Stopped at 07/17/21 2225   acetaminophen (TYLENOL) tablet 650 mg  650 mg Oral Q6H PRN Dawley, Troy C, DO   650 mg at 07/22/21 0930   Or   acetaminophen (TYLENOL) suppository 650 mg  650 mg Rectal Q6H PRN Dawley, Troy C, DO       acetaminophen (TYLENOL) tablet 1,000 mg  1,000 mg Oral Q6H Noemi Chapel P, DO   1,000 mg at 07/22/21 1500   bisacodyl (DULCOLAX) suppository 10 mg  10 mg Rectal PRN Dawley, Troy C, DO  cefTRIAXone (ROCEPHIN) 2 g in sodium chloride 0.9 % 100 mL IVPB  2 g Intravenous Q24H Dawley, Troy C, DO 200 mL/hr at 07/23/21 1737 2 g at 07/23/21 1737   Chlorhexidine Gluconate Cloth 2 % PADS 6 each  6 each Topical Daily Julian Hy, DO   6 each at 07/23/21 [id number]   diphenhydrAMINE (BENADRYL) 12.5 MG/5ML elixir 25 mg  25 mg Oral Q6H PRN Ashok Pall, MD       feeding supplement (ENSURE ENLIVE / ENSURE PLUS) liquid 237 mL  237 mL Oral BID BM Dawley, Troy C, DO   237 mL at 07/23/21 0904   gabapentin (NEURONTIN) capsule 300 mg  300 mg Oral TID Noemi Chapel P, DO   300 mg at 07/22/21 1045   heparin injection 5,000 Units   5,000 Units Subcutaneous Q8H Dawley, Troy C, DO   5,000 Units at 07/24/21 0505   hydrALAZINE (APRESOLINE) injection 10 mg  10 mg Intravenous Q6H PRN Dawley, Troy C, DO       insulin aspart (novoLOG) injection 2-6 Units  2-6 Units Subcutaneous Q4H Julian Hy, DO   2 Units at 07/23/21 2334   menthol-cetylpyridinium (CEPACOL) lozenge 3 mg  1 lozenge Oral PRN Dawley, Troy C, DO       Or   phenol (CHLORASEPTIC) mouth spray 1 spray  1 spray Mouth/Throat PRN Dawley, Troy C, DO       methocarbamol (ROBAXIN) tablet 500 mg  500 mg Oral Q6H PRN Dawley, Troy C, DO   500 mg at 07/24/21 [id number]   Or   methocarbamol (ROBAXIN) 500 mg in dextrose 5 % 50 mL IVPB  500 mg Intravenous Q6H PRN Dawley, Troy C, DO   Stopped at 07/22/21 0050   mineral oil-hydrophilic petrolatum (AQUAPHOR) ointment   Topical BID PRN Dawley, Theodoro Doing, DO   Given at 07/14/21 1257   multivitamin with minerals tablet 1 tablet  1 tablet Oral Daily Dawley, Troy C, DO   1 tablet at 07/23/21 0904   ondansetron (ZOFRAN) tablet 4 mg  4 mg Oral Q8H PRN Shelly Coss, MD       oxyCODONE (Oxy IR/ROXICODONE) immediate release tablet 5 mg  5 mg Oral Q3H PRN Dawley, Troy C, DO   5 mg at 07/24/21 1000   pantoprazole (PROTONIX) EC tablet 40 mg  40 mg Oral Daily Benetta Spar D, RPH       polyethylene glycol (MIRALAX / GLYCOLAX) packet 17 g  17 g Oral BID Dawley, Troy C, DO   17 g at 07/22/21 0930   prochlorperazine (COMPAZINE) injection 10 mg  10 mg Intravenous Q6H PRN Noemi Chapel P, DO       sodium chloride flush (NS) 0.9 % injection 10-40 mL  10-40 mL Intracatheter Q12H Adhikari, Amrit, MD   10 mL at 07/23/21 2138   sodium chloride flush (NS) 0.9 % injection 10-40 mL  10-40 mL Intracatheter PRN Shelly Coss, MD       sodium chloride flush (NS) 0.9 % injection 3 mL  3 mL Intravenous Q12H Dawley, Troy C, DO   3 mL at 07/23/21 2138   sodium chloride flush (NS) 0.9 % injection 3 mL  3 mL Intravenous PRN Dawley, Troy C, DO       traZODone (DESYREL) tablet  25 mg  25 mg Oral QHS PRN Dawley, Troy C, DO   25 mg at 07/18/21 2215     Discharge Medications: Please see discharge summary for a list of discharge  medications.  Relevant Imaging Results:  Relevant Lab Results:   Additional Information SS#: [id number]  Coralee Pesa, LCSWA

## 2021-07-24 NOTE — Progress Notes (Signed)
Physical Therapy Treatment Patient Details Name: Michael [name] MRN: [id number] DOB: 1960-10-05 Today's Date: 07/24/2021   History of Present Illness 61 y/o man presents to [facility] hospital 07/09/2021 with multiple recent admissions of progressive discititis and osteomyelitis, now with worsening pain. CT revealed progressive osteomyelitis/discititis at T8-9 compared to recent studies with bony erosion and possible abscesses. Pt underwent T5-11 fusion with T8-9 corpectomy on 5/18. PMH includes HTN, obesity.    PT Comments    Pt with slow improvement and was able to stand and amb with a walker a short distance in the room with assistance as well as maximum encouragement to overcome his constant negative talk. Wife present for much of session and providing appropriate encouragement.  Feel he needs SNF but that seems unlikely so will continue to work toward wife being able to take home.   Recommendations for follow up therapy are one component of a multi-disciplinary discharge planning process, led by the attending physician.  Recommendations may be updated based on patient status, additional functional criteria and insurance authorization.  Follow Up Recommendations  Skilled nursing-short term rehab (<3 hours/day) (pt refusing so likely will go home)     Assistance Recommended at Discharge Frequent or constant Supervision/Assistance  Patient can return home with the following Two people to help with walking and/or transfers;Two people to help with bathing/dressing/bathroom;Assist for transportation;Help with stairs or ramp for entrance;Assistance with Charity fundraiser (measurements PT);Wheelchair cushion (measurements PT);Hospital bed    Recommendations for Other Services       Precautions / Restrictions Precautions Precautions: Fall;Back Precaution Booklet Issued: No Required Braces or Orthoses: Spinal Brace Spinal Brace: Thoracolumbosacral  orthotic;Applied in sitting position Restrictions Weight Bearing Restrictions: No     Mobility  Bed Mobility Overal bed mobility: Needs Assistance Bed Mobility: Sidelying to Sit   Sidelying to sit: Min assist, HOB elevated       General bed mobility comments: Verbal cues for technique to follow back precautions. Assist to elevate trunk into sitting. Use of rail.    Transfers Overall transfer level: Needs assistance Equipment used: Rolling walker (2 wheels) Transfers: Sit to/from Stand, Bed to chair/wheelchair/BSC Sit to Stand: Min assist, +2 physical assistance   Step pivot transfers: +2 physical assistance, Min assist       General transfer comment: Assist to initiate power up into standing and for stability.    Ambulation/Gait Ambulation/Gait assistance: +2 physical assistance, Min assist Gait Distance (Feet): 5 Feet Assistive device: Rolling walker (2 wheels) Gait Pattern/deviations: Step-to pattern, Decreased step length - right, Decreased step length - left, Wide base of support Gait velocity: decr Gait velocity interpretation: <1.31 ft/sec, indicative of household ambulator   General Gait Details: Assist for balance and support. verbal encouragement that he can do this   Optometrist    Modified Rankin (Stroke Patients Only)       Balance Overall balance assessment: Needs assistance Sitting-balance support: Single extremity supported, Feet supported Sitting balance-Leahy Scale: Fair Sitting balance - Comments: UE support for pain   Standing balance support: Bilateral upper extremity supported, During functional activity Standing balance-Leahy Scale: Poor Standing balance comment: walker and min guard for static standing                            Cognition Arousal/Alertness: Awake/alert Behavior During Therapy: Anxious Overall Cognitive Status: No family/caregiver present to  determine baseline cognitive  functioning                                 General Comments: Pt has a constant stream of negative talk about what he can't do and why he can't do it. Perseverates on being weak. Makes the same statements day after day despite reminders that he can mobilize.        Exercises      General Comments General comments (skin integrity, edema, etc.): VSS on RA      Pertinent Vitals/Pain Pain Assessment Pain Assessment: Faces Faces Pain Scale: Hurts even more Pain Location: back, rt shoulder Pain Descriptors / Indicators: Aching, Pins and needles Pain Intervention(s): Limited activity within patient's tolerance, Monitored during session, Premedicated before session, Repositioned    Home Living                          Prior Function            PT Goals (current goals can now be found in the care plan section) Progress towards PT goals: Progressing toward goals    Frequency    Min 3X/week      PT Plan Current plan remains appropriate    Co-evaluation PT/OT/SLP Co-Evaluation/Treatment: Yes Reason for Co-Treatment: Necessary to address cognition/behavior during functional activity;For patient/therapist safety PT goals addressed during session: Mobility/safety with mobility        AM-PAC PT "6 Clicks" Mobility   Outcome Measure  Help needed turning from your back to your side while in a flat bed without using bedrails?: A Little Help needed moving from lying on your back to sitting on the side of a flat bed without using bedrails?: A Little Help needed moving to and from a bed to a chair (including a wheelchair)?: Total Help needed standing up from a chair using your arms (e.g., wheelchair or bedside chair)?: Total Help needed to walk in hospital room?: Total Help needed climbing 3-5 steps with a railing? : Total 6 Click Score: 10    End of Session Equipment Utilized During Treatment: Gait belt;Back brace Activity Tolerance: Other  (comment);Patient limited by pain (self limiting) Patient left: in chair;with call bell/phone within reach;with chair alarm set;with family/visitor present Nurse Communication: Mobility status PT Visit Diagnosis: Pain;Difficulty in walking, not elsewhere classified (R26.2) Pain - part of body:  (back)     Time: 6160-7371 PT Time Calculation (min) (ACUTE ONLY): 28 min  Charges:  $Gait Training: 8-22 mins                     [facility] PT Acute Rehabilitation Services Office [phone]    Angelina Ok [facility] 07/24/2021, 11:15 AM

## 2021-07-25 DIAGNOSIS — M4624 Osteomyelitis of vertebra, thoracic region: Secondary | ICD-10-CM | POA: Diagnosis not present

## 2021-07-25 LAB — BASIC METABOLIC PANEL
Anion gap: 6 (ref 5–15)
BUN: 7 mg/dL (ref 6–20)
CO2: 26 mmol/L (ref 22–32)
Calcium: 8.4 mg/dL — ABNORMAL LOW (ref 8.9–10.3)
Chloride: 105 mmol/L (ref 98–111)
Creatinine, Ser: 0.85 mg/dL (ref 0.61–1.24)
GFR, Estimated: >60 mL/min (ref >60)
Glucose, Bld: 101 mg/dL — ABNORMAL HIGH (ref 70–99)
Potassium: 3.5 mmol/L (ref 3.5–5.1)
Sodium: 137 mmol/L (ref 135–145)

## 2021-07-25 LAB — CBC
HCT: 27.6 % — ABNORMAL LOW (ref 39.0–52.0)
Hemoglobin: 8.8 g/dL — ABNORMAL LOW (ref 13.0–17.0)
MCH: 30.8 pg (ref 26.0–34.0)
MCHC: 31.9 g/dL (ref 30.0–36.0)
MCV: 96.5 fL (ref 80.0–100.0)
Platelets: 297 10*3/uL (ref 150–400)
RBC: 2.86 MIL/uL — ABNORMAL LOW (ref 4.22–5.81)
RDW: 18.9 % — ABNORMAL HIGH (ref 11.5–15.5)
WBC: 10.4 10*3/uL (ref 4.0–10.5)
nRBC: 0 % (ref 0.0–0.2)

## 2021-07-25 LAB — MAGNESIUM: Magnesium: 2.3 mg/dL (ref 1.7–2.4)

## 2021-07-25 LAB — GLUCOSE, CAPILLARY
Glucose-Capillary: 123 mg/dL — ABNORMAL HIGH (ref 70–99)
Glucose-Capillary: 108 mg/dL — ABNORMAL HIGH (ref 70–99)
Glucose-Capillary: 175 mg/dL — ABNORMAL HIGH (ref 70–99)
Glucose-Capillary: 102 mg/dL — ABNORMAL HIGH (ref 70–99)
Glucose-Capillary: 136 mg/dL — ABNORMAL HIGH (ref 70–99)

## 2021-07-25 NOTE — TOC Progression Note (Addendum)
Transition of Care [facility]) - Progression Note    Patient Details  Name: Michael [name] MRN: [id number] Date of Birth: 1960-05-18  Transition of Care [facility]) CM/SW [place], [place] Phone Number: 07/25/2021, 11:19 AM  Clinical Narrative:    2:00PM CSW spoke with wife and pt. Both are agreeable to SNF. Options were discussed and pt spoke with liaisons from both facilities. Pt asking facilities if they can give all of his meds through his PICC line, CSW explained again that is up to the MD's. CSW explained to pt and wife that due to his frequent denials, CSW is unsure if insurance will pay for another SNF stay. Both noted understanding. Pt and spouse chose [place], [place] left requesting they start auth. Pt asking if he can switch to his wife's insurance so he can get more time, requested CSW ask his wife if he is on her plan. CSW updated spouse and medical team. TOC will continue to follow.  CSW notified that pt's wife had stated she now believes pt needs SNF. CSW faxed pt out. CSW was advised that Insurance has now stopped paying for hospital stay. CSW spoke with spouse [name], who stated she would not be to the hospital until late, but would notify CSW when she is on a break, so a conversation can be had between pt, CSW, and spouse. Meeting should take place between 1 and 2.  CSW met with pt at bedside and notified him that insurance has now stopped and a meeting is planned with his spouse to discuss options. Pt stating he would "rather go home and die peacefully" than go to a SNF. He asks if it might be possible to go to a different hospital and keep receiving care. He states he needs to go somewhere they will give him all of his meds through his PICC line, since he can't take them PO due to dry mouth. RN notified CSW that pt is stating therapies and medical team encouraging him are becoming "aggressive". CSW noted pt should review his bed choices and consider options and CSW will meet with  him and speak with his wife to come up with a plan. TOC will continue to follow for DC needs.   Expected Discharge Plan: [place] Barriers to Discharge: Continued Medical Work up  Expected Discharge Plan and Services Expected Discharge Plan: [place] Choice: [place] Choice arrangements for the past 2 months: Single Family Home Expected Discharge Date: 07/15/21                                     Social Determinants of Health (SDOH) Interventions    Readmission Risk Interventions     View : No data to display.

## 2021-07-25 NOTE — Progress Notes (Signed)
Subjective: Patient reports that he had increased weakness yesterday which made it difficult for him to continue attempts at mobilization. He was able to get up to the chair once yesterday. He continues to be reluctant to further increase mobility. No acute events overnight.   Objective: Vital signs in last 24 hours: Temp:  [97.6 F (36.4 C)-98.9 F (37.2 C)] 98 F (36.7 C) (05/26 0741) Pulse Rate:  [95-100] 95 (05/26 0741) Resp:  [14-17] 17 (05/26 0741) BP: (119-142)/(83-89) 140/87 (05/26 0741) SpO2:  [95 %-98 %] 96 % (05/26 0741)  Intake/Output from previous day: 05/25 0701 - 05/26 0700 In: -  Out: 250 [Urine:250] Intake/Output this shift: No intake/output data recorded.  Physical Exam: Patient is awake, A/O X 4, and conversant. He has a flat affect. He is in NAD. Speech is fluent and appropriate. MAEW with good strength that is symmetric bilaterally. BUE 4+/5 throughout, BLE 4+/5 throughout. No clonus appreciated in BLE. No pathological reflexes. Sensation to light touch is intact. PERLA, EOMI. CNs grossly intact. Dressing is clean dry intact. Incision is well approximated with no drainage, erythema, or edema.    Lab Results: Recent Labs    07/24/21 0435 07/25/21 0323  WBC 9.3 10.4  HGB 8.9* 8.8*  HCT 27.4* 27.6*  PLT 295 297   BMET Recent Labs    07/24/21 0435 07/25/21 0323  NA 138 137  K 3.5 3.5  CL 105 105  CO2 26 26  GLUCOSE 104* 101*  BUN 6 7  CREATININE 0.92 0.85  CALCIUM 8.7* 8.4*    Studies/Results: No results found.  Assessment/Plan: Patient is s/p T5-11 fusion with T8-9 corpectomy due to osteodiscitis with kyphosis and stenosis on 07/17/2021. He is recovering well from his surgery but continues to lack motivation to increase his mobility. He is being encouraged by all participants on his care team as well as his family. We discussed at length again this morning about the need to mobilize and potential complications related to immobility. The patient  voiced understanding but then tried to redirect the conversation to a different topic. His neurological examination stable. He was able to sit up in the bedside chair once yesterday but felt that this was over doing his activity and is reluctant to mobilize today. PT/OT recommending SNF for latter convalescence. Patient stated that he would prefer no to go to a SNF at discharge.    -TLSO when OOB -PT/OT  -Pain control, wean IV analgesics  -Encourage mobility and provide opportunities to mobilize throughout the day  -Incentive spirometer    LOS: 16 days     Council Mechanic, DNP, AGNP-C Neurosurgery Nurse Practitioner  [facility] [address], [place], [place] [address] P: [phone]    F: [phone]  07/25/2021 9:08 AM

## 2021-07-25 NOTE — Progress Notes (Addendum)
Physical Therapy Treatment Patient Details Name: Michael [name] MRN: [id number] DOB: December 01, 1960 Today's Date: 07/25/2021   History of Present Illness 61 y/o man presents to [facility] hospital 07/09/2021 with multiple recent admissions of progressive discititis and osteomyelitis, now with worsening pain. CT revealed progressive osteomyelitis/discititis at T8-9 compared to recent studies with bony erosion and possible abscesses. Pt underwent T5-11 fusion with T8-9 corpectomy on 5/18. PMH includes HTN, obesity.    PT Comments    Pt making slow, steady progress despite constant negative talk and repeating as he does every day that he is too weak and he can't do anything. Pt requiring less assist for mobility and is moving a little more each day with constant encouragement. Pt's self limiting behavior is biggest barrier for his rehab.    Recommendations for follow up therapy are one component of a multi-disciplinary discharge planning process, led by the attending physician.  Recommendations may be updated based on patient status, additional functional criteria and insurance authorization.  Follow Up Recommendations  Skilled nursing-short term rehab (<3 hours/day) (pt may refuse)     Assistance Recommended at Discharge Frequent or constant Supervision/Assistance  Patient can return home with the following Two people to help with walking and/or transfers;Two people to help with bathing/dressing/bathroom;Assist for transportation;Help with stairs or ramp for entrance;Assistance with Education officer, environmental (measurements PT);Wheelchair cushion (measurements PT);Hospital bed    Recommendations for Other Services       Precautions / Restrictions Precautions Precautions: Fall;Back Precaution Booklet Issued: No Required Braces or Orthoses: Spinal Brace Spinal Brace: Thoracolumbosacral orthotic;Applied in sitting position Restrictions Weight Bearing Restrictions: No      Mobility  Bed Mobility Overal bed mobility: Needs Assistance Bed Mobility: Sidelying to Sit   Sidelying to sit: Min assist, HOB elevated       General bed mobility comments: Verbal cues for technique to follow back precautions. Assist to elevate trunk into sitting. Use of rail.    Transfers Overall transfer level: Needs assistance Equipment used: Rolling walker (2 wheels) Transfers: Sit to/from Stand, Bed to chair/wheelchair/BSC Sit to Stand: Min assist, +2 safety/equipment   Step pivot transfers: +2 physical assistance, Min assist       General transfer comment: Assist to initiate power up into standing and for stability. Step pivot to chair with min assist for balance and support    Ambulation/Gait Ambulation/Gait assistance: +2 physical assistance, Min assist Gait Distance (Feet): 10 Feet Assistive device: Rolling walker (2 wheels) Gait Pattern/deviations: Step-to pattern, Decreased step length - right, Decreased step length - left, Wide base of support, Knee hyperextension - right, Knee hyperextension - left Gait velocity: decr Gait velocity interpretation: <1.31 ft/sec, indicative of household ambulator   General Gait Details: Assist for balance and support. verbal encouragement that he can do this   Chief Strategy Officer    Modified Rankin (Stroke Patients Only)       Balance Overall balance assessment: Needs assistance Sitting-balance support: Single extremity supported, Feet supported Sitting balance-Leahy Scale: Fair Sitting balance - Comments: UE support for pain   Standing balance support: Bilateral upper extremity supported, During functional activity Standing balance-Leahy Scale: Poor Standing balance comment: walker and min guard for static standing                            Cognition Arousal/Alertness: Awake/alert Behavior During Therapy: Anxious Overall Cognitive Status:  No family/caregiver present to  determine baseline cognitive functioning                                 General Comments: Pt has a constant stream of negative talk about what he can't do and why he can't do it. Perseverates on being weak. Makes the same statements day after day despite reminders that he can mobilize.        Exercises      General Comments        Pertinent Vitals/Pain Pain Assessment Pain Assessment: 0-10 Pain Score: 8  Pain Location: back, rt shoulder Pain Descriptors / Indicators: Aching Pain Intervention(s): Limited activity within patient's tolerance, Monitored during session, Premedicated before session, Repositioned    Home Living                          Prior Function            PT Goals (current goals can now be found in the care plan section) Progress towards PT goals: Progressing toward goals    Frequency    Min 3X/week      PT Plan Current plan remains appropriate    Co-evaluation              AM-PAC PT "6 Clicks" Mobility   Outcome Measure  Help needed turning from your back to your side while in a flat bed without using bedrails?: A Little Help needed moving from lying on your back to sitting on the side of a flat bed without using bedrails?: A Little Help needed moving to and from a bed to a chair (including a wheelchair)?: Total Help needed standing up from a chair using your arms (e.g., wheelchair or bedside chair)?: Total Help needed to walk in hospital room?: Total Help needed climbing 3-5 steps with a railing? : Total 6 Click Score: 10    End of Session Equipment Utilized During Treatment: Gait belt;Back brace Activity Tolerance: Other (comment);Patient limited by pain (self limiting) Patient left: in chair;with call bell/phone within reach;with chair alarm set Nurse Communication: Mobility status PT Visit Diagnosis: Pain;Difficulty in walking, not elsewhere classified (R26.2) Pain - part of body:  (back)     Time:  1005-1030 PT Time Calculation (min) (ACUTE ONLY): 25 min  Charges:  $Therapeutic Activity: 23-37 mins                     [place] Office [place] 07/25/2021, 1:17 PM

## 2021-07-25 NOTE — Progress Notes (Signed)
PROGRESS NOTE    Michael [name]  WUJ:[id number] DOB: [birth year] DOA: 07/09/2021 PCP: [name], PA   Brief Narrative:   Patient is a 61 year old male with history of T8-9 discitis/osteomyelitis, E. coli bacteremia presented to [facility] ER with complaint of pain.  CT imaging again showed T8-T9 osteomyelitis, discitis, now with evidence of bony erosion, progression since prior.  He was transferred to [facility] for neurosurgery evaluation, admitted under [facility] service.  Patient was started on broad-spectrum antibiotics, ID consulted.  Patient underwent T5-T11 fusion, T8-T9 corpectomy by neurosurgery and was transferred to ICU for postop stay.  Hospital course remarkable for poor mobility.  PT/OT recommending skilled nursing facility on discharge.  Medically stable for discharge and TOC now assisting with SNF placement.  Assessment & Plan:   Principal Problem:   Osteomyelitis of thoracic region [facility]) Active Problems:   Thoracic discitis   Poor dentition   Nausea and vomiting   Essential hypertension  Assessment and Plan:   Thoracic vertebral osteomyelitis:History of T8-9 discitis/osteomyelitis, E. coli bacteremia presented to [facility] ER with complaint of pain.  CT imaging again showed T8-T9 osteomyelitis, discitis, now with evidence of bony erosion, progression since prior.Patient was started on broad-spectrum antibiotics, ID consulted.  Patient underwent T5-T11 fusion, T8-T9 corpectomy by neurosurgery.  Neurosurgery recommends TLSO brace when out of bed. Blood cultures no growth till date. Infectious disease recommended ceftriaxone till 7/6. S/P  PICC line placement. Continue pain management, supportive care.  Currently hemodynamically stable.   Acute blood loss anemia: Currently hemoglobin stable in the range of 8.  He was transfused with a unit of PRBC here during this hospitalization .currently hemoglobin stable   Medical noncompliance/suicidal ideation when speaking  to wife: Denies any suicidal ideations, but continues to have some element of depression.  Could consider psychiatric evaluation if absolutely needed.   Prediabetes: Hemoglobin A1c of 6.1.  Currently on sliding scale insulin.  Does not take any medications at home.   Oral candidiasis: Treated with nystatin   Hypertension: Currently blood pressure stable without any medications, only on as needed medications for now.  Takes lisinopril-hydrochlorothiazide at home   AKI: Resolved   GERD/chronic nausea/vomiting: Continue daily PPI.  He was seen by gastroenterology on April when he declined EGD.   Debility/deconditioning: Patient seen by PT/OT and recommended skilled nursing discharge.  Poor mobility.  Needs lots of encouragement.  Patient reluctant on skilled nursing facility placement, wants to go home with home health but says he is not ready yet.  Assistance  from [facility] appreciated   Obesity: BMI of 38      DVT prophylaxis: Heparin Code Status: Full Family Communication: Discussed with wife on phone 5/25 Disposition Plan:  Status is: Inpatient Remains inpatient appropriate because:      Nutritional Assessment:   The patient's BMI is: Body mass index is 38.71 kg/m.Marland Kitchen   Seen by dietician.  I agree with the assessment and plan as outlined below:   Nutrition Status: Nutrition Problem: Increased nutrient needs Etiology: post-op healing Signs/Symptoms: estimated needs Interventions: Ensure Enlive (each supplement provides 350kcal and 20 grams of protein), MVI   .       Skin Assessment:   I have examined the patient's skin and I agree with the wound assessment as performed by the wound care RN as outlined below:     Consultants:  Neurosurgery ID PCCM   Procedures:  T5-T11 fusion with T8-9 corpectomy   Antimicrobials:  Anti-infectives (From admission, onward)  Start     Dose/Rate Route Frequency Ordered Stop   07/17/21 1830  ceFAZolin (ANCEF) IVPB 2g/100 mL premix   Status:  Discontinued        2 g 200 mL/hr over 30 Minutes Intravenous Every 8 hours 07/17/21 1737 07/17/21 1744   07/17/21 1745  cefTRIAXone (ROCEPHIN) 2 g in sodium chloride 0.9 % 100 mL IVPB        2 g 200 mL/hr over 30 Minutes Intravenous Every 24 hours 07/17/21 1745     07/16/21 2234  ceFAZolin (ANCEF) IVPB 3g/100 mL premix        3 g 200 mL/hr over 30 Minutes Intravenous 30 min pre-op 07/16/21 2234 07/17/21 1226   07/11/21 1700  vancomycin (VANCOCIN) IVPB 1000 mg/200 mL premix  Status:  Discontinued        1,000 mg 200 mL/hr over 60 Minutes Intravenous Every 12 hours 07/11/21 0923 07/11/21 1257   07/11/21 1345  DAPTOmycin (CUBICIN) 750 mg in sodium chloride 0.9 % IVPB  Status:  Discontinued        8 mg/kg  95.5 kg (Adjusted) 130 mL/hr over 30 Minutes Intravenous Daily 07/11/21 1257 07/22/21 0909   07/11/21 1345  cefTRIAXone (ROCEPHIN) 2 g in sodium chloride 0.9 % 100 mL IVPB  Status:  Discontinued        2 g 200 mL/hr over 30 Minutes Intravenous Every 24 hours 07/11/21 1257 07/17/21 1745   07/10/21 1400  vancomycin (VANCOREADY) IVPB 1250 mg/250 mL  Status:  Discontinued        1,250 mg 166.7 mL/hr over 90 Minutes Intravenous Every 12 hours 07/09/21 2249 07/11/21 0923   07/09/21 2230  piperacillin-tazobactam (ZOSYN) IVPB 3.375 g  Status:  Discontinued        3.375 g 12.5 mL/hr over 240 Minutes Intravenous Every 8 hours 07/09/21 2137 07/11/21 1257       Subjective: Patient seen and evaluated today with multiple concerns of aches and pains throughout his body.  He feels weak and defeated and states that he cannot do much or get out of bed.  He is very anxious about going to a rehabilitation facility.  He has been encouraged on multiple occasions to sit up in a chair, but he feels that this will cause him more pain and will not provide him any benefit.  Objective: Vitals:   07/25/21 0304 07/25/21 0741 07/25/21 1118 07/25/21 1547  BP: (!) 142/89 140/87 122/83 (!) 149/85  Pulse:  98 95 88 95  Resp: 15 17 15 20   Temp: 97.6 F (36.4 C) 98 F (36.7 C) 98.6 F (37 C) (!) 97.4 F (36.3 C)  TempSrc: Oral Oral Oral Oral  SpO2: 95% 96% 98% 98%  Weight:      Height:        Intake/Output Summary (Last 24 hours) at 07/25/2021 1807 Last data filed at 07/25/2021 [id number] Gross per 24 hour  Intake --  Output 250 ml  Net -250 ml   Filed Weights   07/09/21 2200  Weight: 125.9 kg    Examination:  General exam: Appears calm and comfortable  Respiratory system: Clear to auscultation. Respiratory effort normal. Cardiovascular system: S1 & S2 heard, RRR.  Gastrointestinal system: Abdomen is soft Central nervous system: Alert and awake Extremities: No edema Skin: No significant lesions noted Psychiatry: Flat affect.    Data Reviewed: I have personally reviewed following labs and imaging studies  CBC: Recent Labs  Lab 07/19/21 0818 07/20/21 [id number] 07/21/21 [id number] 07/24/21 0435 07/25/21 [id number]  WBC 11.3* 10.4 10.9* 9.3 10.4  NEUTROABS 7.2 6.0  --   --   --   HGB 9.7* 8.6* 8.8* 8.9* 8.8*  HCT 31.6* 26.8* 27.0* 27.4* 27.6*  MCV 99.4 95.0 94.1 95.1 96.5  PLT 144* 181 204 295 297   Basic Metabolic Panel: Recent Labs  Lab 07/19/21 0818 07/21/21 0152 07/22/21 0407 07/24/21 0435 07/25/21 0323  NA 139 140 135 138 137  K 4.0 3.3* 3.6 3.5 3.5  CL 108 108 106 105 105  CO2 20* 24 26 26 26   GLUCOSE 109* 95 108* 104* 101*  BUN 8 6 <5* 6 7  CREATININE 0.97 0.85 0.94 0.92 0.85  CALCIUM 8.8* 8.4* 8.5* 8.7* 8.4*  MG  --   --   --   --  2.3   GFR: Estimated Creatinine Clearance: 126.4 mL/min (by C-G formula based on SCr of 0.85 mg/dL). Liver Function Tests: Recent Labs  Lab 07/19/21 0818 07/21/21 0152  AST 23 19  ALT 17 18  ALKPHOS 61 65  BILITOT 0.7 0.8  PROT 5.5* 5.3*  ALBUMIN 3.0* 2.7*   No results for input(s): LIPASE, AMYLASE in the last 168 hours. No results for input(s): AMMONIA in the last 168 hours. Coagulation Profile: No results for input(s):  INR, PROTIME in the last 168 hours. Cardiac Enzymes: Recent Labs  Lab 07/20/21 0238  CKTOTAL 184   BNP (last 3 results) No results for input(s): PROBNP in the last 8760 hours. HbA1C: No results for input(s): HGBA1C in the last 72 hours. CBG: Recent Labs  Lab 07/24/21 2002 07/24/21 2312 07/25/21 0306 07/25/21 0738 07/25/21 1116  GLUCAP 148* 111* 123* 108* 175*   Lipid Profile: No results for input(s): CHOL, HDL, LDLCALC, TRIG, CHOLHDL, LDLDIRECT in the last 72 hours. Thyroid Function Tests: No results for input(s): TSH, T4TOTAL, FREET4, T3FREE, THYROIDAB in the last 72 hours. Anemia Panel: No results for input(s): VITAMINB12, FOLATE, FERRITIN, TIBC, IRON, RETICCTPCT in the last 72 hours. Sepsis Labs: No results for input(s): PROCALCITON, LATICACIDVEN in the last 168 hours.  Recent Results (from the past 240 hour(s))  Aerobic/Anaerobic Culture w Gram Stain (surgical/deep wound)     Status: None   Collection Time: 07/17/21 10:28 AM   Specimen: Soft Tissue, Other  Result Value Ref Range Status   Specimen Description TISSUE  Final   Special Requests THRACIC EIGHT NINE JOINT FOR CULTURE  Final   Gram Stain NO WBC SEEN NO ORGANISMS SEEN   Final   Culture   Final    No growth aerobically or anaerobically. Performed at [facility], [address], [place], [place]    Report Status 07/22/2021 FINAL  Final  Aerobic/Anaerobic Culture w Gram Stain (surgical/deep wound)     Status: None   Collection Time: 07/17/21 12:27 PM   Specimen: Soft Tissue, Other  Result Value Ref Range Status   Specimen Description WOUND  Final   Special Requests THORAIC EIGHT NINE DISC SPACE WOUND CULTURE SPEC B  Final   Gram Stain NO WBC SEEN NO ORGANISMS SEEN   Final   Culture   Final    No growth aerobically or anaerobically. Performed at [facility], [address], [place], [place]    Report Status 07/22/2021 FINAL  Final  MRSA Next Gen by PCR, Nasal      Status: None   Collection Time: 07/17/21  6:28 PM   Specimen: Nasal Mucosa; Nasal Swab  Result Value Ref Range Status  MRSA by PCR Next Gen NOT DETECTED NOT DETECTED Final    Comment: (NOTE) The GeneXpert MRSA Assay (FDA approved for NASAL specimens only), is one component of a comprehensive MRSA colonization surveillance program. It is not intended to diagnose MRSA infection nor to guide or monitor treatment for MRSA infections. Test performance is not FDA approved in patients less than 4 years old. Performed at [facility], [address] [place], [place] [address]          Radiology Studies: No results found.      Scheduled Meds:  sodium chloride   Intravenous Once   acetaminophen  1,000 mg Oral Q6H   Chlorhexidine Gluconate Cloth  6 each Topical Daily   feeding supplement  237 mL Oral TID BM   gabapentin  300 mg Oral TID   heparin injection (subcutaneous)  5,000 Units Subcutaneous Q8H   insulin aspart  2-6 Units Subcutaneous Q4H   multivitamin with minerals  1 tablet Oral Daily   pantoprazole  40 mg Oral Daily   polyethylene glycol  17 g Oral BID   sodium chloride flush  10-40 mL Intracatheter Q12H   sodium chloride flush  3 mL Intravenous Q12H   Continuous Infusions:  sodium chloride Stopped (07/17/21 2225)   cefTRIAXone (ROCEPHIN)  IV 2 g (07/24/21 1820)   methocarbamol (ROBAXIN) IV Stopped (07/22/21 0050)     LOS: 16 days    Time spent: 35 minutes    Walid Haig Hoover Brunette, DO [facility]  If 7PM-7AM, please contact night-coverage [email or web address] 07/25/2021, 6:07 PM

## 2021-07-26 DIAGNOSIS — M4624 Osteomyelitis of vertebra, thoracic region: Secondary | ICD-10-CM | POA: Diagnosis not present

## 2021-07-26 LAB — GLUCOSE, CAPILLARY
Glucose-Capillary: 93 mg/dL (ref 70–99)
Glucose-Capillary: 89 mg/dL (ref 70–99)
Glucose-Capillary: 106 mg/dL — ABNORMAL HIGH (ref 70–99)
Glucose-Capillary: 122 mg/dL — ABNORMAL HIGH (ref 70–99)
Glucose-Capillary: 95 mg/dL (ref 70–99)
Glucose-Capillary: 145 mg/dL — ABNORMAL HIGH (ref 70–99)

## 2021-07-26 LAB — BASIC METABOLIC PANEL
Anion gap: 7 (ref 5–15)
BUN: 6 mg/dL (ref 6–20)
CO2: 26 mmol/L (ref 22–32)
Calcium: 8.7 mg/dL — ABNORMAL LOW (ref 8.9–10.3)
Chloride: 107 mmol/L (ref 98–111)
Creatinine, Ser: 0.94 mg/dL (ref 0.61–1.24)
GFR, Estimated: >60 mL/min (ref >60)
Glucose, Bld: 90 mg/dL (ref 70–99)
Potassium: 3.6 mmol/L (ref 3.5–5.1)
Sodium: 140 mmol/L (ref 135–145)

## 2021-07-26 LAB — CBC
HCT: 28.6 % — ABNORMAL LOW (ref 39.0–52.0)
Hemoglobin: 9.3 g/dL — ABNORMAL LOW (ref 13.0–17.0)
MCH: 31.3 pg (ref 26.0–34.0)
MCHC: 32.5 g/dL (ref 30.0–36.0)
MCV: 96.3 fL (ref 80.0–100.0)
Platelets: 324 10*3/uL (ref 150–400)
RBC: 2.97 MIL/uL — ABNORMAL LOW (ref 4.22–5.81)
RDW: 19 % — ABNORMAL HIGH (ref 11.5–15.5)
WBC: 10.1 10*3/uL (ref 4.0–10.5)
nRBC: 0 % (ref 0.0–0.2)

## 2021-07-26 LAB — MAGNESIUM: Magnesium: 2.3 mg/dL (ref 1.7–2.4)

## 2021-07-26 MED ORDER — LISINOPRIL-HYDROCHLOROTHIAZIDE 10-12.5 MG PO TABS: 0.5000 | ORAL_TABLET | Freq: Every day | ORAL | Status: DC

## 2021-07-26 MED ORDER — LISINOPRIL 5 MG PO TABS
5.0000 mg | ORAL_TABLET | Freq: Every day | ORAL | Status: DC
  Administered 2021-07-29: 5 mg via ORAL
  Filled 2021-07-26 (×2): qty 1

## 2021-07-26 MED ORDER — HYDROCHLOROTHIAZIDE 12.5 MG PO TABS
6.2500 mg | ORAL_TABLET | Freq: Every day | ORAL | Status: DC
  Administered 2021-07-29: 6.25 mg via ORAL
  Filled 2021-07-26 (×2): qty 1

## 2021-07-26 NOTE — Progress Notes (Signed)
PROGRESS NOTE    Michael [name]  OAC:[id number] DOB: 1961/02/02 DOA: 07/09/2021 PCP: [name], PA   Brief Narrative:  Patient is a 61 year old male with history of T8-9 discitis/osteomyelitis, E. coli bacteremia presented to [facility] ER with complaint of pain.  CT imaging again showed T8-T9 osteomyelitis, discitis, now with evidence of bony erosion, progression since prior.  He was transferred to [facility] for neurosurgery evaluation, admitted under [facility] service.  Patient was started on broad-spectrum antibiotics, ID consulted.  Patient underwent T5-T11 fusion, T8-T9 corpectomy by neurosurgery and was transferred to ICU for postop stay.  Hospital course remarkable for poor mobility.  PT/OT recommending skilled nursing facility on discharge.  Medically stable for discharge and TOC now assisting with SNF placement.  Assessment & Plan:  Thoracic vertebral osteomyelitis: -CT imaging again showed T8-T9 osteomyelitis, discitis, now with evidence of bony erosion, progression since prior. -Patient was started on broad-spectrum antibiotics, ID consulted.   -Patient underwent T5-T11 fusion, T8-T9 corpectomy by neurosurgery. -Neurosurgery recommends TLSO brace when out of bed. -Blood cultures no growth till date. -ID recommended ceftriaxone till 7/6. S/P  PICC line placement. -Continue pain management, supportive care.  Currently hemodynamically stable. -Continue PT/OT   Acute blood loss anemia:  -Currently hemoglobin stable between 8-9 -He was transfused with a unit of PRBC here during this hospitalization    Prediabetes:  -Hemoglobin A1c of 6.1.  Currently on sliding scale insulin.  Does not take any medications at home.   Oral candidiasis: Treated with nystatin   Hypertension: BP elevated this morning.  Resume home lisinopril-HCTZ Continue to monitor blood pressure  AKI: Resolved   GERD/chronic nausea/vomiting: Continue daily PPI.    Debility/deconditioning: Patient seen by  PT/OT and recommended skilled nursing discharge.  Poor mobility.  Needs lots of encouragement.  Assistance  from [facility] [place] appreciated   Obesity: BMI of 38  DVT prophylaxis: Heparin Code Status: Full code Family Communication:  None present at bedside.  Plan of care discussed with patient in length and he verbalized understanding and agreed with it. Disposition Plan: SNF  Consultants:  Infectious disease Neurosurgery PCCM  Procedures:  T5-T11 fusion with T8-T9 corpectomy  Antimicrobials:  Rocephin  Status is: Inpatient     Subjective: Patient seen and examined this morning.  Lying comfortably on the bed.  Patient reports that he continues to have back pain, 9 out of 10 and does not want to get out of the bed or do physical therapy today.  No fever overnight.  Denies chest pain, shortness of breath, nausea or vomiting.  He had good bowel movements yesterday. Objective: Vitals:   07/25/21 2315 07/26/21 0308 07/26/21 0317 07/26/21 0732  BP: (!) 135/93  (!) 140/93 (!) 151/97  Pulse:  89  94  Resp: 11 16  18   Temp: 98 F (36.7 C) (!) 97.1 F (36.2 C)  97.8 F (36.6 C)  TempSrc: Oral Axillary  Oral  SpO2:  100%  95%  Weight:      Height:        Intake/Output Summary (Last 24 hours) at 07/26/2021 07/28/2021 Last data filed at 07/26/2021 07/28/2021 Gross per 24 hour  Intake --  Output 100 ml  Net -100 ml   Filed Weights   07/09/21 2200  Weight: 125.9 kg    Examination:  General exam: Appears calm and comfortable, on room air, communicating well, obese Respiratory system: Clear to auscultation. Respiratory effort normal. Cardiovascular system: S1 & S2 heard, RRR. No JVD, murmurs, rubs, gallops  or clicks. No pedal edema. Gastrointestinal system: Abdomen is nondistended, soft and nontender. No organomegaly or masses felt. Normal bowel sounds heard. Central nervous system: Alert and oriented. No focal neurological deficits. Extremities: No leg edema, moving all extremities  equally Skin: No rashes, lesions or ulcers    Data Reviewed: I have personally reviewed following labs and imaging studies  CBC: Recent Labs  Lab 07/20/21 0238 07/21/21 0152 07/24/21 0435 07/25/21 0323 07/26/21 0350  WBC 10.4 10.9* 9.3 10.4 10.1  NEUTROABS 6.0  --   --   --   --   HGB 8.6* 8.8* 8.9* 8.8* 9.3*  HCT 26.8* 27.0* 27.4* 27.6* 28.6*  MCV 95.0 94.1 95.1 96.5 96.3  PLT 181 204 295 297 324   Basic Metabolic Panel: Recent Labs  Lab 07/21/21 0152 07/22/21 0407 07/24/21 0435 07/25/21 0323 07/26/21 0350  NA 140 135 138 137 140  K 3.3* 3.6 3.5 3.5 3.6  CL 108 106 105 105 107  CO2 24 26 26 26 26   GLUCOSE 95 108* 104* 101* 90  BUN 6 <5* 6 7 6   CREATININE 0.85 0.94 0.92 0.85 0.94  CALCIUM 8.4* 8.5* 8.7* 8.4* 8.7*  MG  --   --   --  2.3 2.3   GFR: Estimated Creatinine Clearance: 114.3 mL/min (by C-G formula based on SCr of 0.94 mg/dL). Liver Function Tests: Recent Labs  Lab 07/21/21 0152  AST 19  ALT 18  ALKPHOS 65  BILITOT 0.8  PROT 5.3*  ALBUMIN 2.7*   No results for input(s): LIPASE, AMYLASE in the last 168 hours. No results for input(s): AMMONIA in the last 168 hours. Coagulation Profile: No results for input(s): INR, PROTIME in the last 168 hours. Cardiac Enzymes: Recent Labs  Lab 07/20/21 0238  CKTOTAL 184   BNP (last 3 results) No results for input(s): PROBNP in the last 8760 hours. HbA1C: No results for input(s): HGBA1C in the last 72 hours. CBG: Recent Labs  Lab 07/25/21 1545 07/25/21 1950 07/25/21 2317 07/26/21 0312 07/26/21 0751  GLUCAP 93 102* 136* 89 106*   Lipid Profile: No results for input(s): CHOL, HDL, LDLCALC, TRIG, CHOLHDL, LDLDIRECT in the last 72 hours. Thyroid Function Tests: No results for input(s): TSH, T4TOTAL, FREET4, T3FREE, THYROIDAB in the last 72 hours. Anemia Panel: No results for input(s): VITAMINB12, FOLATE, FERRITIN, TIBC, IRON, RETICCTPCT in the last 72 hours. Sepsis Labs: No results for input(s):  PROCALCITON, LATICACIDVEN in the last 168 hours.  Recent Results (from the past 240 hour(s))  Aerobic/Anaerobic Culture w Gram Stain (surgical/deep wound)     Status: None   Collection Time: 07/17/21 10:28 AM   Specimen: Soft Tissue, Other  Result Value Ref Range Status   Specimen Description TISSUE  Final   Special Requests THRACIC EIGHT NINE JOINT FOR CULTURE  Final   Gram Stain NO WBC SEEN NO ORGANISMS SEEN   Final   Culture   Final    No growth aerobically or anaerobically. Performed at [facility], [address] [place], [address] [place]    Report Status 07/22/2021 FINAL  Final  Aerobic/Anaerobic Culture w Gram Stain (surgical/deep wound)     Status: None   Collection Time: 07/17/21 12:27 PM   Specimen: Soft Tissue, Other  Result Value Ref Range Status   Specimen Description WOUND  Final   Special Requests THORAIC EIGHT NINE DISC SPACE WOUND CULTURE SPEC B  Final   Gram Stain NO WBC SEEN NO ORGANISMS SEEN   Final  Culture   Final    No growth aerobically or anaerobically. Performed at [facility], [address] [place], [place] [address]    Report Status 07/22/2021 FINAL  Final  MRSA Next Gen by PCR, Nasal     Status: None   Collection Time: 07/17/21  6:28 PM   Specimen: Nasal Mucosa; Nasal Swab  Result Value Ref Range Status   MRSA by PCR Next Gen NOT DETECTED NOT DETECTED Final    Comment: (NOTE) The GeneXpert MRSA Assay (FDA approved for NASAL specimens only), is one component of a comprehensive MRSA colonization surveillance program. It is not intended to diagnose MRSA infection nor to guide or monitor treatment for MRSA infections. Test performance is not FDA approved in patients less than 59 years old. Performed at [facility]  [facility], [address] [place], [place] [address]       Radiology Studies: No results found.  Scheduled Meds:  sodium chloride   Intravenous Once   acetaminophen  1,000 mg Oral Q6H   Chlorhexidine Gluconate  Cloth  6 each Topical Daily   feeding supplement  237 mL Oral TID BM   gabapentin  300 mg Oral TID   heparin injection (subcutaneous)  5,000 Units Subcutaneous Q8H   insulin aspart  2-6 Units Subcutaneous Q4H   multivitamin with minerals  1 tablet Oral Daily   pantoprazole  40 mg Oral Daily   polyethylene glycol  17 g Oral BID   sodium chloride flush  10-40 mL Intracatheter Q12H   sodium chloride flush  3 mL Intravenous Q12H   Continuous Infusions:  sodium chloride Stopped (07/17/21 2225)   cefTRIAXone (ROCEPHIN)  IV 2 g (07/25/21 1830)   methocarbamol (ROBAXIN) IV Stopped (07/22/21 0050)     LOS: 17 days   Time spent: 35 minutes   Marcell Pfeifer Estill Cotta Lyanna Blystone, MD [facility]  If 7PM-7AM, please contact night-coverage [email or web address] 07/26/2021, 9:52 AM

## 2021-07-27 DIAGNOSIS — M4624 Osteomyelitis of vertebra, thoracic region: Secondary | ICD-10-CM | POA: Diagnosis not present

## 2021-07-27 LAB — GLUCOSE, CAPILLARY
Glucose-Capillary: 101 mg/dL — ABNORMAL HIGH (ref 70–99)
Glucose-Capillary: 108 mg/dL — ABNORMAL HIGH (ref 70–99)
Glucose-Capillary: 125 mg/dL — ABNORMAL HIGH (ref 70–99)
Glucose-Capillary: 147 mg/dL — ABNORMAL HIGH (ref 70–99)
Glucose-Capillary: 150 mg/dL — ABNORMAL HIGH (ref 70–99)
Glucose-Capillary: 153 mg/dL — ABNORMAL HIGH (ref 70–99)
Glucose-Capillary: 96 mg/dL (ref 70–99)

## 2021-07-27 NOTE — Progress Notes (Signed)
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 coli bacteremia presented to [facility] ER with complaint of pain.  CT imaging again showed T8-T9 osteomyelitis, discitis, now with evidence of bony erosion, progression since prior.  He was transferred to [facility] for neurosurgery evaluation, admitted under [facility] service.  Patient was started on broad-spectrum antibiotics, ID consulted.  Patient underwent T5-T11 fusion, T8-T9 corpectomy by neurosurgery and was transferred to ICU for postop stay.  Hospital course remarkable for poor mobility.  PT/OT recommending skilled nursing facility on discharge.  Medically stable for discharge and TOC now assisting with SNF placement.  Assessment & Plan:  Thoracic vertebral osteomyelitis: -CT imaging again showed T8-T9 osteomyelitis, discitis, now with evidence of bony erosion, progression since prior. -Patient was started on broad-spectrum antibiotics, ID consulted.   -Patient underwent T5-T11 fusion, T8-T9 corpectomy by neurosurgery on 07/17/2021. -Neurosurgery recommends TLSO brace when out of bed. -Blood cultures no growth till date. -ID recommended ceftriaxone till 7/6. S/P  PICC line placement. -Continue pain management, supportive care.  Currently hemodynamically stable. -Continue PT/OT   Acute blood loss anemia:  -Currently hemoglobin stable between 8-9 -He was transfused with a unit of PRBC here during this hospitalization    Prediabetes:  -Hemoglobin A1c of 6.1.  Currently on sliding scale insulin.  Does not take any medications at home.   Oral candidiasis: Treated with nystatin   Hypertension: Stable.  Continue lisinopril-HCTZ  AKI: Resolved   GERD/chronic nausea/vomiting: Continue daily PPI.    Debility/deconditioning: Patient seen by PT/OT and recommended skilled nursing  discharge.  Poor mobility.  Needs lots of encouragement.  Assistance  from [facility] appreciated   Obesity: BMI of 38   DVT prophylaxis: Heparin Code Status: Full code Family Communication:  None present at bedside.  Plan of care discussed with patient in length and he verbalized understanding and agreed with it.  Disposition Plan: SNF  Consultants:  Infectious disease Neurosurgery PCCM  Procedures:  T5-T11 fusion with T8-T9 corpectomy  Antimicrobials:  Rocephin  Status is: Inpatient     Subjective: Patient seen and examined.  Tells me that he did not sleep well last night.  Hesitant for physical therapy today.  He is tearful and feels overwhelmed.  Denies depressed mood, suicidal or homicidal thoughts at this time.  Refused pysch consult at this time however willing to talk to chaplain. No acute events overnight.   objective: Vitals:   07/26/21 1939 07/26/21 2359 07/27/21 0318 07/27/21 0744  BP: 123/80 125/77 (!) 131/94 128/89  Pulse:    97  Resp: 17 16 13 20   Temp: 97.8 F (36.6 C) 97.9 F (36.6 C) 98.1 F (36.7 C) 97.7 F (36.5 C)  TempSrc: Oral Oral Oral Oral  SpO2:    96%  Weight:      Height:        Intake/Output Summary (Last 24 hours) at 07/27/2021 07/29/2021 Last data filed at 07/27/2021 0319 Gross per 24 hour  Intake --  Output 1150 ml  Net -1150 ml    Filed Weights   07/09/21 2200  Weight: 125.9 kg    Examination:  General exam: Appears calm and comfortable, on room air, communicating well, obese, appears older than stated age Respiratory system: Clear to auscultation. Respiratory effort normal. Cardiovascular system: S1 & S2 heard, RRR. No JVD, murmurs, rubs, gallops or clicks. No  pedal edema. Gastrointestinal system: Abdomen is nondistended, soft and nontender. No organomegaly or masses felt. Normal bowel sounds heard. Central nervous system: Alert and oriented. No focal neurological deficits. Extremities: No leg edema, moving all extremities  equally Skin: No rashes, lesions or ulcers.  PICC line on left arm-no signs of active bleeding, discharge or any sign of infection Psych: Tearful    Data Reviewed: I have personally reviewed following labs and imaging studies  CBC: Recent Labs  Lab 07/21/21 0152 07/24/21 0435 07/25/21 0323 07/26/21 0350  WBC 10.9* 9.3 10.4 10.1  HGB 8.8* 8.9* 8.8* 9.3*  HCT 27.0* 27.4* 27.6* 28.6*  MCV 94.1 95.1 96.5 96.3  PLT 204 295 297 324    Basic Metabolic Panel: Recent Labs  Lab 07/21/21 0152 07/22/21 0407 07/24/21 0435 07/25/21 0323 07/26/21 0350  NA 140 135 138 137 140  K 3.3* 3.6 3.5 3.5 3.6  CL 108 106 105 105 107  CO2 24 26 26 26 26   GLUCOSE 95 108* 104* 101* 90  BUN 6 <5* 6 7 6   CREATININE 0.85 0.94 0.92 0.85 0.94  CALCIUM 8.4* 8.5* 8.7* 8.4* 8.7*  MG  --   --   --  2.3 2.3    GFR: Estimated Creatinine Clearance: 114.3 mL/min (by C-G formula based on SCr of 0.94 mg/dL). Liver Function Tests: Recent Labs  Lab 07/21/21 0152  AST 19  ALT 18  ALKPHOS 65  BILITOT 0.8  PROT 5.3*  ALBUMIN 2.7*    No results for input(s): LIPASE, AMYLASE in the last 168 hours. No results for input(s): AMMONIA in the last 168 hours. Coagulation Profile: No results for input(s): INR, PROTIME in the last 168 hours. Cardiac Enzymes: No results for input(s): CKTOTAL, CKMB, CKMBINDEX, TROPONINI in the last 168 hours.  BNP (last 3 results) No results for input(s): PROBNP in the last 8760 hours. HbA1C: No results for input(s): HGBA1C in the last 72 hours. CBG: Recent Labs  Lab 07/26/21 1646 07/26/21 1941 07/27/21 0001 07/27/21 0322 07/27/21 0748  GLUCAP 95 145* 153* 101* 150*    Lipid Profile: No results for input(s): CHOL, HDL, LDLCALC, TRIG, CHOLHDL, LDLDIRECT in the last 72 hours. Thyroid Function Tests: No results for input(s): TSH, T4TOTAL, FREET4, T3FREE, THYROIDAB in the last 72 hours. Anemia Panel: No results for input(s): VITAMINB12, FOLATE, FERRITIN, TIBC, IRON,  RETICCTPCT in the last 72 hours. Sepsis Labs: No results for input(s): PROCALCITON, LATICACIDVEN in the last 168 hours.  Recent Results (from the past 240 hour(s))  Aerobic/Anaerobic Culture w Gram Stain (surgical/deep wound)     Status: None   Collection Time: 07/17/21 10:28 AM   Specimen: Soft Tissue, Other  Result Value Ref Range Status   Specimen Description TISSUE  Final   Special Requests THRACIC EIGHT NINE JOINT FOR CULTURE  Final   Gram Stain NO WBC SEEN NO ORGANISMS SEEN   Final   Culture   Final    No growth aerobically or anaerobically. Performed at [facility], [address], [place], [address] [place]    Report Status 07/22/2021 FINAL  Final  Aerobic/Anaerobic Culture w Gram Stain (surgical/deep wound)     Status: None   Collection Time: 07/17/21 12:27 PM   Specimen: Soft Tissue, Other  Result Value Ref Range Status   Specimen Description WOUND  Final   Special Requests THORAIC EIGHT NINE DISC SPACE WOUND CULTURE SPEC B  Final   Gram Stain NO WBC SEEN NO ORGANISMS SEEN   Final  Culture   Final    No growth aerobically or anaerobically. Performed at [facility], [address] [place], [place] [address]    Report Status 07/22/2021 FINAL  Final  MRSA Next Gen by PCR, Nasal     Status: None   Collection Time: 07/17/21  6:28 PM   Specimen: Nasal Mucosa; Nasal Swab  Result Value Ref Range Status   MRSA by PCR Next Gen NOT DETECTED NOT DETECTED Final    Comment: (NOTE) The GeneXpert MRSA Assay (FDA approved for NASAL specimens only), is one component of a comprehensive MRSA colonization surveillance program. It is not intended to diagnose MRSA infection nor to guide or monitor treatment for MRSA infections. Test performance is not FDA approved in patients less than 59 years old. Performed at [facility], [address] [place], [place] [address]        Radiology Studies: No results found.  Scheduled Meds:  sodium chloride    Intravenous Once   acetaminophen  1,000 mg Oral Q6H   Chlorhexidine Gluconate Cloth  6 each Topical Daily   feeding supplement  237 mL Oral TID BM   gabapentin  300 mg Oral TID   heparin injection (subcutaneous)  5,000 Units Subcutaneous Q8H   lisinopril  5 mg Oral Daily   And   hydrochlorothiazide  6.25 mg Oral Daily   insulin aspart  2-6 Units Subcutaneous Q4H   multivitamin with minerals  1 tablet Oral Daily   pantoprazole  40 mg Oral Daily   polyethylene glycol  17 g Oral BID   sodium chloride flush  10-40 mL Intracatheter Q12H   sodium chloride flush  3 mL Intravenous Q12H   Continuous Infusions:  sodium chloride Stopped (07/17/21 2225)   cefTRIAXone (ROCEPHIN)  IV Stopped (07/26/21 1847)   methocarbamol (ROBAXIN) IV Stopped (07/22/21 0050)     LOS: 18 days   Time spent: 35 minutes   Kamirah Shugrue Estill Cotta Ashur Glatfelter, MD [facility]  If 7PM-7AM, please contact night-coverage [email or web address] 07/27/2021, 9:29 AM

## 2021-07-27 NOTE — Progress Notes (Signed)
Subjective: Patient reports mid back muscle stiffness. He had a large BM yesterday. He has not been able to sit up n the chair over the last two days du to being "tired." No acute events overnight.   Objective: Vital signs in last 24 hours: Temp:  [97.5 F (36.4 C)-98.1 F (36.7 C)] 97.7 F (36.5 C) (05/28 0744) Pulse Rate:  [92-103] 97 (05/28 0744) Resp:  [13-20] 20 (05/28 0744) BP: (123-147)/(77-94) 128/89 (05/28 0744) SpO2:  [96 %-98 %] 96 % (05/28 0744)  Intake/Output from previous day: 05/27 0701 - 05/28 0700 In: -  Out: 1150 [Urine:1150] Intake/Output this shift: No intake/output data recorded.  Physical Exam: Patient is awake, A/O X 4, and conversant. He has a flat affect but appears in better spirits today. He is in NAD. VSS.  Speech is fluent and appropriate. MAEW. BUE 4+/5 throughout, BLE 4+/5 throughout. Sensation to light touch is intact. PERLA, EOMI. CNs grossly intact. Dressing is clean dry intact. Incision is well approximated with no drainage, erythema, or edema.   Lab Results: Recent Labs    07/25/21 0323 07/26/21 0350  WBC 10.4 10.1  HGB 8.8* 9.3*  HCT 27.6* 28.6*  PLT 297 324   BMET Recent Labs    07/25/21 0323 07/26/21 0350  NA 137 140  K 3.5 3.6  CL 105 107  CO2 26 26  GLUCOSE 101* 90  BUN 7 6  CREATININE 0.85 0.94  CALCIUM 8.4* 8.7*    Studies/Results: No results found.  Assessment/Plan:     Assessment/Plan: Patient is s/p T5-11 fusion with T8-9 corpectomy due to osteodiscitis with kyphosis and stenosis on 07/17/2021. He is continuing to recover well and has had a significant improvement in his back pain, however, he continues to lack motivation and is reluctant to mobilize. We continue to discuss his need for mobilization on a daily basis and provide a significant amount of encouragement and rationale as to why he needs to mobilize. Despite all efforts, he continues to decline attempts at mobilization. His neurological exam remains  stable. PT/OT recommending SNF for latter convalescence. He appears to be more amenable to SNF placement today.     -TLSO when OOB -PT/OT  -Pain control, wean IV analgesics  -Encourage mobility and provide opportunities to mobilize throughout the day      LOS: 18 days    Council Mechanic, DNP, AGNP-C Neurosurgery Nurse Practitioner  [facility] [address], [address], [place], [place] [address] P: [phone]    F: [phone]  07/27/2021, 8:41 AM

## 2021-07-28 DIAGNOSIS — M4624 Osteomyelitis of vertebra, thoracic region: Secondary | ICD-10-CM | POA: Diagnosis not present

## 2021-07-28 LAB — GLUCOSE, CAPILLARY
Glucose-Capillary: 132 mg/dL — ABNORMAL HIGH (ref 70–99)
Glucose-Capillary: 102 mg/dL — ABNORMAL HIGH (ref 70–99)
Glucose-Capillary: 143 mg/dL — ABNORMAL HIGH (ref 70–99)
Glucose-Capillary: 116 mg/dL — ABNORMAL HIGH (ref 70–99)
Glucose-Capillary: 138 mg/dL — ABNORMAL HIGH (ref 70–99)

## 2021-07-28 NOTE — Progress Notes (Signed)
Notified MD of patient refusing Gabapentin.

## 2021-07-28 NOTE — Progress Notes (Signed)
Notified MD of patient refusing Red Med

## 2021-07-28 NOTE — Progress Notes (Signed)
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 coli bacteremia presented to [facility] ER with complaint of pain.  CT imaging again showed T8-T9 osteomyelitis, discitis, now with evidence of bony erosion, progression since prior.  He was transferred to [facility] for neurosurgery evaluation, admitted under [facility] service.  Patient was started on broad-spectrum antibiotics, ID consulted.  Patient underwent T5-T11 fusion, T8-T9 corpectomy by neurosurgery and was transferred to ICU for postop stay.  Hospital course remarkable for poor mobility.  PT/OT recommending skilled nursing facility on discharge.  Medically stable for discharge and TOC now assisting with SNF placement.  Assessment & Plan:  Thoracic vertebral osteomyelitis: -CT imaging again showed T8-T9 osteomyelitis, discitis, now with evidence of bony erosion, progression since prior. -Patient underwent T5-T11 fusion, T8-T9 corpectomy by neurosurgery on 07/17/2021. -Neurosurgery recommends TLSO brace when out of bed. -Blood cultures no growth till date. -ID recommended ceftriaxone till 7/6. S/P  PICC line placement. -Continue pain management, supportive care.  Currently hemodynamically stable. -Continue PT/OT-recommend SNF   Acute blood loss anemia:  -Currently hemoglobin stable between 8-9 -He was transfused with a unit of PRBC here during this hospitalization    Prediabetes:  -Hemoglobin A1c of 6.1.  Currently on sliding scale insulin.  Does not take any medications at home.   Oral candidiasis: Treated with nystatin   Hypertension: Stable.  Continue lisinopril-HCTZ  AKI: Resolved   GERD/chronic nausea/vomiting: Continue daily PPI.   Depressed mood: evaluated by psych on 5/15. He denies SI or HI -He feels overwhelmed due to ongoing pain -cont. Supportive therapy.  Consulted chaplain.   Debility/deconditioning: Patient seen by PT/OT and recommended skilled nursing discharge.  Poor mobility.  Needs lots of encouragement for PT.  -Assistance  from [facility] appreciated   Obesity: BMI of 38   DVT prophylaxis: Heparin Code Status: Full code Family Communication:  None present at bedside.  Plan of care discussed with patient in length and he verbalized understanding and agreed with it.  Disposition Plan: SNF  Consultants:  Infectious disease Neurosurgery PCCM  Procedures:  T5-T11 fusion with T8-T9 corpectomy  Antimicrobials:  Rocephin  Status is: Inpatient     Subjective: Patient seen and examined.  Reports some right shoulder soreness.  Hesitant to sit on the recliner.  Does not want physical therapy to be aggressive.  No fever, chills, nausea or vomiting.  No acute events overnight.  objective: Vitals:   07/27/21 1933 07/27/21 2331 07/28/21 0339 07/28/21 0751  BP: (!) 139/98 (!) 130/94 114/81 (!) 138/91  Pulse:    93  Resp: 17 16 14 17   Temp: 98 F (36.7 C) 98.2 F (36.8 C) 97.9 F (36.6 C) 98.1 F (36.7 C)  TempSrc: Oral Oral Oral Oral  SpO2:    97%  Weight:      Height:        Intake/Output Summary (Last 24 hours) at 07/28/2021 07/30/2021 Last data filed at 07/27/2021 2128 Gross per 24 hour  Intake --  Output 350 ml  Net -350 ml    Filed Weights   07/09/21 2200  Weight: 125.9 kg    Examination:  General exam: Appears calm and comfortable, on room air, communicating well, obese, appears older than stated age Respiratory system: Clear to auscultation. Respiratory effort normal. Cardiovascular system: S1 & S2 heard, RRR. No JVD, murmurs, rubs, gallops or clicks.  No pedal edema. Gastrointestinal system: Abdomen is obese, soft and nontender. No organomegaly or masses felt. Normal bowel sounds heard. Central nervous system: Alert and oriented. No focal neurological deficits. Extremities: No leg edema, moving all extremities  equally Skin: No rashes, lesions or ulcers.  PICC line on right upper arm-no signs of active bleeding, discharge or any sign of infection Psych: seems overwhelmed    Data Reviewed: I have personally reviewed following labs and imaging studies  CBC: Recent Labs  Lab 07/24/21 0435 07/25/21 0323 07/26/21 0350  WBC 9.3 10.4 10.1  HGB 8.9* 8.8* 9.3*  HCT 27.4* 27.6* 28.6*  MCV 95.1 96.5 96.3  PLT 295 297 324    Basic Metabolic Panel: Recent Labs  Lab 07/22/21 0407 07/24/21 0435 07/25/21 0323 07/26/21 0350  NA 135 138 137 140  K 3.6 3.5 3.5 3.6  CL 106 105 105 107  CO2 26 26 26 26   GLUCOSE 108* 104* 101* 90  BUN <5* 6 7 6   CREATININE 0.94 0.92 0.85 0.94  CALCIUM 8.5* 8.7* 8.4* 8.7*  MG  --   --  2.3 2.3    GFR: Estimated Creatinine Clearance: 114.3 mL/min (by C-G formula based on SCr of 0.94 mg/dL). Liver Function Tests: No results for input(s): AST, ALT, ALKPHOS, BILITOT, PROT, ALBUMIN in the last 168 hours.  No results for input(s): LIPASE, AMYLASE in the last 168 hours. No results for input(s): AMMONIA in the last 168 hours. Coagulation Profile: No results for input(s): INR, PROTIME in the last 168 hours. Cardiac Enzymes: No results for input(s): CKTOTAL, CKMB, CKMBINDEX, TROPONINI in the last 168 hours.  BNP (last 3 results) No results for input(s): PROBNP in the last 8760 hours. HbA1C: No results for input(s): HGBA1C in the last 72 hours. CBG: Recent Labs  Lab 07/27/21 1658 07/27/21 1937 07/27/21 2334 07/28/21 0342 07/28/21 0749  GLUCAP 96 108* 125* 132* 102*    Lipid Profile: No results for input(s): CHOL, HDL, LDLCALC, TRIG, CHOLHDL, LDLDIRECT in the last 72 hours. Thyroid Function Tests: No results for input(s): TSH, T4TOTAL, FREET4, T3FREE, THYROIDAB in the last 72 hours. Anemia Panel: No results for input(s): VITAMINB12, FOLATE, FERRITIN, TIBC, IRON, RETICCTPCT in the last 72 hours. Sepsis Labs: No results for input(s): PROCALCITON,  LATICACIDVEN in the last 168 hours.  No results found for this or any previous visit (from the past 240 hour(s)).     Radiology Studies: No results found.  Scheduled Meds:  sodium chloride   Intravenous Once   acetaminophen  1,000 mg Oral Q6H   Chlorhexidine Gluconate Cloth  6 each Topical Daily   feeding supplement  237 mL Oral TID BM   gabapentin  300 mg Oral TID   heparin injection (subcutaneous)  5,000 Units Subcutaneous Q8H   lisinopril  5 mg Oral Daily   And   hydrochlorothiazide  6.25 mg Oral Daily   insulin aspart  2-6 Units Subcutaneous Q4H   multivitamin with minerals  1 tablet Oral Daily   pantoprazole  40 mg Oral Daily   polyethylene glycol  17 g Oral BID   sodium chloride flush  10-40 mL Intracatheter Q12H   sodium chloride flush  3 mL Intravenous Q12H   Continuous Infusions:  sodium chloride Stopped (07/17/21 2225)   cefTRIAXone (ROCEPHIN)  IV 2 g (07/27/21 1842)   methocarbamol (ROBAXIN) IV Stopped (07/22/21 0050)     LOS: 19 days   Time spent: 35 minutes   Estephan Gallardo Estill Cotta Amaiyah Nordhoff, MD [facility]  If  7PM-7AM, please contact night-coverage [email or web address] 07/28/2021, 9:17 AM

## 2021-07-28 NOTE — Progress Notes (Signed)
Physical Therapy Treatment Patient Details Name: Michael [name] MRN: [id number] DOB: 09-Nov-1960 Today's Date: 07/28/2021   History of Present Illness 61 y/o man presents to [facility] hospital 07/09/2021 with multiple recent admissions of progressive discititis and osteomyelitis, now with worsening pain. CT revealed progressive osteomyelitis/discititis at T8-9 compared to recent studies with bony erosion and possible abscesses. Pt underwent T5-11 fusion with T8-9 corpectomy on 5/18. PMH includes HTN, obesity.    PT Comments    STAR PT Session: Pt continues with constant stream of excuses as to why he cannot participate in therapy. PT does not acknowledge negative thoughts and provides constant positive feedback for his accomplishments with moving. Pt needs constant cuing however is able to perform bed mobility, and transfer with min A of 1-2 and ambulates with min A and close chair. Able to ambulate a total of ~68 feet. Pt request to be able to pedal himself back to room in the recliner. PT agreeable and pt reports it was harder than walking. PT working towards goal of discharge home and will follow back tomorrow.   Recommendations for follow up therapy are one component of a multi-disciplinary discharge planning process, led by the attending physician.  Recommendations may be updated based on patient status, additional functional criteria and insurance authorization.  Follow Up Recommendations  Home health PT     Assistance Recommended at Discharge Frequent or constant Supervision/Assistance  Patient can return home with the following Assist for transportation;Help with stairs or ramp for entrance;Assistance with cooking/housework;A lot of help with walking and/or transfers;A lot of help with bathing/dressing/bathroom   Equipment Recommendations  Wheelchair (measurements PT);Wheelchair cushion (measurements PT);Hospital bed       Precautions / Restrictions Precautions Precautions:  Fall;Back Precaution Booklet Issued: No Required Braces or Orthoses: Spinal Brace Spinal Brace: Thoracolumbosacral orthotic;Applied in sitting position Restrictions Weight Bearing Restrictions: No     Mobility  Bed Mobility Overal bed mobility: Needs Assistance Bed Mobility: Sidelying to Sit Rolling: Min guard Sidelying to sit: Min assist, HOB elevated       General bed mobility comments: pt tries to get OOB by sliding LE off EoB, requires cuing for bring LE back into bed so he can use proper log rolling technique to maintain back precautions.    Transfers Overall transfer level: Needs assistance Equipment used: Rolling walker (2 wheels) Transfers: Sit to/from Stand, Bed to chair/wheelchair/BSC Sit to Stand: Min assist, +2 safety/equipment   Step pivot transfers: +2 physical assistance, Min assist       General transfer comment: Assist to initiate power up into standing and for stability. Step pivot to chair with min assist for balance and support, performs 2 additional sit<>stand during ambulation    Ambulation/Gait Ambulation/Gait assistance: +2 physical assistance, Min assist Gait Distance (Feet): 18 Feet (+40') Assistive device: Rolling walker (2 wheels) Gait Pattern/deviations: Step-to pattern, Decreased step length - right, Decreased step length - left, Wide base of support, Knee hyperextension - right, Knee hyperextension - left, Step-through pattern Gait velocity: decr Gait velocity interpretation: <1.31 ft/sec, indicative of household ambulator   General Gait Details: min A for stabilization, constant cuing for upright posture, looking up and out, while ignoring the constant stream of reasons he can't go any further       Balance Overall balance assessment: Needs assistance Sitting-balance support: Single extremity supported, Feet supported Sitting balance-Leahy Scale: Fair     Standing balance support: Bilateral upper extremity supported, During  functional activity Standing balance-Leahy Scale: Poor Standing balance comment: walker  and min guard for static standing                            Cognition Arousal/Alertness: Awake/alert Behavior During Therapy: Anxious Overall Cognitive Status: No family/caregiver present to determine baseline cognitive functioning                                 General Comments: continues his stream on negative talk, perseverating on reasons he can't mobilize even while he is actually moving        Exercises Other Exercises Other Exercises: uses LE to pedal himself back to room in the recliner    General Comments General comments (skin integrity, edema, etc.): VSS on RA      Pertinent Vitals/Pain Pain Assessment Pain Assessment: Faces Faces Pain Scale: Hurts a little bit Pain Location: back, rt shoulder Pain Descriptors / Indicators: Aching Pain Intervention(s): Limited activity within patient's tolerance, Monitored during session, Repositioned     PT Goals (current goals can now be found in the care plan section) Acute Rehab PT Goals PT Goal Formulation: With patient/family Time For Goal Achievement: 08/01/21 Potential to Achieve Goals: Fair Progress towards PT goals: Progressing toward goals    Frequency    Min 3X/week      PT Plan Current plan remains appropriate       AM-PAC PT "6 Clicks" Mobility   Outcome Measure  Help needed turning from your back to your side while in a flat bed without using bedrails?: A Little Help needed moving from lying on your back to sitting on the side of a flat bed without using bedrails?: A Little Help needed moving to and from a bed to a chair (including a wheelchair)?: A Little Help needed standing up from a chair using your arms (e.g., wheelchair or bedside chair)?: A Little Help needed to walk in hospital room?: A Little Help needed climbing 3-5 steps with a railing? : Total 6 Click Score: 16    End of  Session Equipment Utilized During Treatment: Gait belt;Back brace Activity Tolerance: Other (comment);Patient limited by pain (self limiting) Patient left: in chair;with call bell/phone within reach;with chair alarm set Nurse Communication: Mobility status PT Visit Diagnosis: Pain;Difficulty in walking, not elsewhere classified (R26.2) Pain - Right/Left: Right Pain - part of body:  (back)     Time: [id number] PT Time Calculation (min) (ACUTE ONLY): 52 min  Charges:  $Gait Training: 8-22 mins $Therapeutic Exercise: 8-22 mins $Therapeutic Activity: 8-22 mins                     Merlon Alcorta B. Migdalia Dk PT, DPT Acute Rehabilitation Services Please use secure chat or  Call Office [phone]    [place] 07/28/2021, 4:36 PM

## 2021-07-28 NOTE — Progress Notes (Addendum)
   07/28/21 1130  Clinical Encounter Type  Visited With Patient  Visit Type Initial;Spiritual support  Referral From Nurse  Consult/Referral To Chaplain   Chaplain responded to a spiritual consult for conversation. The patient, Michael [name], is often tearful as he reminisced over past events with his family. [name] expressed also a reluctance to take much pain medication though he knows it would be helpful. He said he was trying to take some before his pending therapy hoping it would allow him to work harder.  He has much to be thankful for, success and care of family, friends who come to visit were what he shared today. These are what keeps him going during this hospital stay.   Michael [name] [facility]  [phone]

## 2021-07-28 NOTE — TOC Progression Note (Signed)
Transition of Care [facility]) - Progression Note    Patient Details  Name: Michael [name] MRN: [id number] Date of Birth: 04-19-1960  Transition of Care [facility]) CM/SW Contact  Eduard Roux, [place] Phone Number: 07/28/2021, 2:08 PM  Clinical Narrative:      Carylon Perches [facility]- informed Insurance authorization for SNF still pending.  Expected Discharge Plan: Home w Home Health Services Barriers to Discharge: Insurance Authorization  Expected Discharge Plan and Services Expected Discharge Plan: Home w Home Health Services     Post Acute Care Choice: Home Health Living arrangements for the past 2 months: Single Family Home Expected Discharge Date: 07/15/21                                     Social Determinants of Health (SDOH) Interventions    Readmission Risk Interventions     View : No data to display.

## 2021-07-29 DIAGNOSIS — M4624 Osteomyelitis of vertebra, thoracic region: Secondary | ICD-10-CM | POA: Diagnosis not present

## 2021-07-29 LAB — CBC WITH DIFFERENTIAL/PLATELET
Abs Immature Granulocytes: 0.32 10*3/uL — ABNORMAL HIGH (ref 0.00–0.07)
Basophils Absolute: 0.1 10*3/uL (ref 0.0–0.1)
Basophils Relative: 1 %
Eosinophils Absolute: 0.3 10*3/uL (ref 0.0–0.5)
Eosinophils Relative: 4 %
HCT: 30.2 % — ABNORMAL LOW (ref 39.0–52.0)
Hemoglobin: 9.3 g/dL — ABNORMAL LOW (ref 13.0–17.0)
Immature Granulocytes: 3 %
Lymphocytes Relative: 21 %
Lymphs Abs: 2 10*3/uL (ref 0.7–4.0)
MCH: 30.6 pg (ref 26.0–34.0)
MCHC: 30.8 g/dL (ref 30.0–36.0)
MCV: 99.3 fL (ref 80.0–100.0)
Monocytes Absolute: 0.8 10*3/uL (ref 0.1–1.0)
Monocytes Relative: 8 %
Neutro Abs: 6 10*3/uL (ref 1.7–7.7)
Neutrophils Relative %: 63 %
Platelets: 320 10*3/uL (ref 150–400)
RBC: 3.04 MIL/uL — ABNORMAL LOW (ref 4.22–5.81)
RDW: 18.7 % — ABNORMAL HIGH (ref 11.5–15.5)
WBC: 9.5 10*3/uL (ref 4.0–10.5)
nRBC: 0 % (ref 0.0–0.2)

## 2021-07-29 LAB — BASIC METABOLIC PANEL
Anion gap: 6 (ref 5–15)
BUN: 9 mg/dL (ref 6–20)
CO2: 27 mmol/L (ref 22–32)
Calcium: 8.9 mg/dL (ref 8.9–10.3)
Chloride: 107 mmol/L (ref 98–111)
Creatinine, Ser: 0.84 mg/dL (ref 0.61–1.24)
GFR, Estimated: >60 mL/min (ref >60)
Glucose, Bld: 107 mg/dL — ABNORMAL HIGH (ref 70–99)
Potassium: 3.8 mmol/L (ref 3.5–5.1)
Sodium: 140 mmol/L (ref 135–145)

## 2021-07-29 LAB — GLUCOSE, CAPILLARY
Glucose-Capillary: 106 mg/dL — ABNORMAL HIGH (ref 70–99)
Glucose-Capillary: 108 mg/dL — ABNORMAL HIGH (ref 70–99)
Glucose-Capillary: 116 mg/dL — ABNORMAL HIGH (ref 70–99)
Glucose-Capillary: 128 mg/dL — ABNORMAL HIGH (ref 70–99)
Glucose-Capillary: 131 mg/dL — ABNORMAL HIGH (ref 70–99)
Glucose-Capillary: 139 mg/dL — ABNORMAL HIGH (ref 70–99)
Glucose-Capillary: 97 mg/dL (ref 70–99)

## 2021-07-29 MED ORDER — ENSURE ENLIVE PO LIQD: 237.0000 mL | Freq: Three times a day (TID) | ORAL | 12 refills | Status: AC

## 2021-07-29 MED ORDER — ACETAMINOPHEN 500 MG PO TABS
1000.0000 mg | ORAL_TABLET | Freq: Four times a day (QID) | ORAL | 0 refills | Status: AC
Start: 2021-07-29 — End: ?

## 2021-07-29 MED ORDER — ADULT MULTIVITAMIN W/MINERALS CH: 1.0000 | ORAL_TABLET | Freq: Every day | ORAL | Status: AC

## 2021-07-29 MED ORDER — OXYCODONE HCL 5 MG PO TABS: 5.0000 mg | ORAL_TABLET | ORAL | 0 refills | Status: DC | PRN

## 2021-07-29 MED ORDER — METHOCARBAMOL 500 MG PO TABS: 500.0000 mg | ORAL_TABLET | Freq: Four times a day (QID) | ORAL | 1 refills | Status: AC | PRN

## 2021-07-29 MED ORDER — CEFTRIAXONE IV (FOR PTA / DISCHARGE USE ONLY): 2.0000 g | INTRAVENOUS | 0 refills | Status: AC

## 2021-07-29 NOTE — TOC Progression Note (Signed)
Transition of Care [facility]) - Progression Note    Patient Details  Name: Michael [name] MRN: [id number] Date of Birth: [birth year]  Transition of Care [facility]) CM/SW [place], [place] Phone Number: 07/29/2021, 2:45 PM  Clinical Narrative:     CSW informed by PT/Elizabeth, patient will need SNF and updated recommendations will be made.   CSW met with patient's spouse(patient working with PT). CSW introduced self and explained reason for visit. CSW confirmed with patient/family of the plan for short term rehab at [facility]. CSW reminded family (as stated by previous CSW/Jessica ), if insurance does not approve , family would need to explore other care options. Family states understanding.  CSW informed [facility]/SNF- sent message, informed patient will d/c to SNF and to re-start insurance authorization.   Thurmond Butts, MSW, LCSW Clinical Social Worker    Expected Discharge Plan: Home w Home Health Services Barriers to Discharge: Insurance Authorization  Expected Discharge Plan and Services Expected Discharge Plan: [place] arrangements for the past 2 months: Single Family Home Expected Discharge Date: 07/29/21                                     Social Determinants of Health (SDOH) Interventions    Readmission Risk Interventions     View : No data to display.

## 2021-07-29 NOTE — Progress Notes (Signed)
   Providing Compassionate, Quality Care - Together  NEUROSURGERY PROGRESS NOTE   S: No issues overnight.   O: EXAM:  BP (!) 152/96 (BP Location: Left Arm)   Pulse 93   Temp (!) 97.5 F (36.4 C)   Resp 20   Ht 5\' 11"  (1.803 m)   Wt 125.9 kg   SpO2 98%   BMI 38.71 kg/m   Awake, alert, oriented x3 PERRL Speech fluent, appropriate  CNs grossly intact  5/5 BUE/BLE  Incision clean dry and intact  ASSESSMENT:  61 y.o. male with   T8-9 osteodiscitis  -Status post T5-11 fusion, T8-9 corpectomy  PLAN: -Continue PT/OT, home health planning -DC staples in 2 weeks (in 2 days) -Continue pain control    Thank you for allowing me to participate in this patient's care.  Please do not hesitate to call with questions or concerns.   46, DO Neurosurgeon [facility] Cell: [phone]

## 2021-07-29 NOTE — Plan of Care (Signed)

## 2021-07-29 NOTE — Progress Notes (Signed)
PROGRESS NOTE    Michael [name]  [id number] DOB: 1960-09-18 DOA: 07/09/2021 PCP: Lavella Lemons, PA   Brief Narrative:  Patient is a 61 year old male with history of T8-9 discitis/osteomyelitis, E. coli bacteremia presented to [facility] ER with complaint of pain.  CT imaging again showed T8-T9 osteomyelitis, discitis, now with evidence of bony erosion, progression since prior.  He was transferred to [facility] for [facility]surgery evaluation, admitted under [facility] service.  Patient was started on broad-spectrum antibiotics, ID consulted.  Patient underwent T5-T11 fusion, T8-T9 corpectomy by neurosurgery and was transferred to ICU for postop stay.  Hospital course remarkable for poor mobility.  PT/OT recommending skilled nursing facility on discharge.  Medically stable for discharge and TOC now assisting with SNF placement (auth pending).  Assessment & Plan:  Thoracic vertebral osteomyelitis: -CT imaging again showed T8-T9 osteomyelitis, discitis, now with evidence of bony erosion, progression since prior. -Patient underwent T5-T11 fusion, T8-T9 corpectomy by neurosurgery on 07/17/2021. -Neurosurgery recommends TLSO brace when out of bed. -Blood cultures no growth till date. -ID recommended ceftriaxone till 7/6. S/P  PICC line placement. -Continue pain management, supportive care.  Currently hemodynamically stable. -Continue PT/OT-recommend SNF   Acute blood loss anemia:  -Currently hemoglobin stable between 8-9 -s/p transfused 1 unit of PRBC this hospitalization    Prediabetes:  -Hemoglobin A1c of 6.1.  Currently on sliding scale insulin.  Does not take any medications at home.   Oral candidiasis: Treated with nystatin   Hypertension: Stable.  Continue lisinopril-HCTZ  AKI: Resolved   GERD/chronic nausea/vomiting: Continue daily PPI.   Depressed mood: evaluated by psych on 5/15. He denies SI or HI.  Reports feeling overwhelmed due to ongoing pain -cont. Supportive therapy.   -Consulted chaplain.   Debility/deconditioning: Patient seen by PT/OT and recommended skilled nursing discharge.  Poor mobility.  Needs lots of encouragement for PT.  -Assistance  from [facility] appreciated -SNF is re-starting authorization   Obesity: BMI of 38   DVT prophylaxis: Heparin Code Status: Full code Family Communication:  None present at bedside.  Plan of care discussed with patient in length and he verbalized understanding and agreed with it.  Disposition Plan: SNF  Consultants:  Infectious disease Neurosurgery PCCM  Procedures:  T5-T11 fusion with T8-T9 corpectomy  Antimicrobials:  Rocephin  Status is: Inpatient     Subjective: Patient seen and examined at bedside after being up to chair with tech and now back in bed.  He reports family are not home at all times and he would not have adequate assistance at home.  No other acute complaints at this time.  objective: Vitals:   07/29/21 0028 07/29/21 0333 07/29/21 0722 07/29/21 1457  BP: 127/86 (!) 146/98 (!) 152/96 129/85  Pulse:   93 (!) 106  Resp: 18 15 20 20   Temp: 97.6 F (36.4 C) 98.3 F (36.8 C) (!) 97.5 F (36.4 C) 98.4 F (36.9 C)  TempSrc: Oral Oral    SpO2:   98%   Weight:      Height:        Intake/Output Summary (Last 24 hours) at 07/29/2021 1533 Last data filed at 07/29/2021 0900 Gross per 24 hour  Intake 210 ml  Output 900 ml  Net -690 ml   Filed Weights   07/09/21 2200  Weight: 125.9 kg    Examination:  General exam: awake, alert, NAD, communicating well, obese, appears older than stated age Respiratory system: Clear to auscultation. Respiratory effort normal. Cardiovascular system: S1 & S2 heard,  RRR. No pedal edema. Gastrointestinal system: Abdomen is obese, soft and nontender.  Central nervous system: Alert and oriented. No focal neurological deficits. Extremities: No leg edema, moving all extremities equally Skin: No rashes, lesions or ulcers.  PICC line in RUE-no signs of  active bleeding, discharge or any sign of infection Psych: anxious mood (overwhelmed), congruent affect    Data Reviewed: I have personally reviewed following labs and imaging studies  CBC: Recent Labs  Lab 07/24/21 0435 07/25/21 0323 07/26/21 0350 07/29/21 0336  WBC 9.3 10.4 10.1 9.5  NEUTROABS  --   --   --  6.0  HGB 8.9* 8.8* 9.3* 9.3*  HCT 27.4* 27.6* 28.6* 30.2*  MCV 95.1 96.5 96.3 99.3  PLT 295 297 324 [id number]   Basic Metabolic Panel: Recent Labs  Lab 07/24/21 0435 07/25/21 0323 07/26/21 0350 07/29/21 0336  NA 138 137 140 140  K 3.5 3.5 3.6 3.8  CL 105 105 107 107  CO2 26 26 26 27   GLUCOSE 104* 101* 90 107*  BUN 6 7 6 9   CREATININE 0.92 0.85 0.94 0.84  CALCIUM 8.7* 8.4* 8.7* 8.9  MG  --  2.3 2.3  --    GFR: Estimated Creatinine Clearance: 127.9 mL/min (by C-G formula based on SCr of 0.84 mg/dL). Liver Function Tests: No results for input(s): AST, ALT, ALKPHOS, BILITOT, PROT, ALBUMIN in the last 168 hours.  No results for input(s): LIPASE, AMYLASE in the last 168 hours. No results for input(s): AMMONIA in the last 168 hours. Coagulation Profile: No results for input(s): INR, PROTIME in the last 168 hours. Cardiac Enzymes: No results for input(s): CKTOTAL, CKMB, CKMBINDEX, TROPONINI in the last 168 hours.  BNP (last 3 results) No results for input(s): PROBNP in the last 8760 hours. HbA1C: No results for input(s): HGBA1C in the last 72 hours. CBG: Recent Labs  Lab 07/29/21 0027 07/29/21 0332 07/29/21 0727 07/29/21 1159 07/29/21 1453  GLUCAP 106* 108* 116* 131* 128*   Lipid Profile: No results for input(s): CHOL, HDL, LDLCALC, TRIG, CHOLHDL, LDLDIRECT in the last 72 hours. Thyroid Function Tests: No results for input(s): TSH, T4TOTAL, FREET4, T3FREE, THYROIDAB in the last 72 hours. Anemia Panel: No results for input(s): VITAMINB12, FOLATE, FERRITIN, TIBC, IRON, RETICCTPCT in the last 72 hours. Sepsis Labs: No results for input(s): PROCALCITON,  LATICACIDVEN in the last 168 hours.  No results found for this or any previous visit (from the past 240 hour(s)).     Radiology Studies: No results found.  Scheduled Meds:  sodium chloride   Intravenous Once   acetaminophen  1,000 mg Oral Q6H   Chlorhexidine Gluconate Cloth  6 each Topical Daily   feeding supplement  237 mL Oral TID BM   gabapentin  300 mg Oral TID   heparin injection (subcutaneous)  5,000 Units Subcutaneous Q8H   lisinopril  5 mg Oral Daily   And   hydrochlorothiazide  6.25 mg Oral Daily   insulin aspart  2-6 Units Subcutaneous Q4H   multivitamin with minerals  1 tablet Oral Daily   pantoprazole  40 mg Oral Daily   polyethylene glycol  17 g Oral BID   sodium chloride flush  10-40 mL Intracatheter Q12H   sodium chloride flush  3 mL Intravenous Q12H   Continuous Infusions:  sodium chloride Stopped (07/17/21 2225)   cefTRIAXone (ROCEPHIN)  IV 2 g (07/28/21 1750)   methocarbamol (ROBAXIN) IV Stopped (07/22/21 0050)     LOS: 20 days   Time spent: 35 minutes  Ezekiel Slocumb, DO [facility]  If 7PM-7AM, please contact night-coverage [email or web address] 07/29/2021, 3:33 PM

## 2021-07-29 NOTE — Progress Notes (Signed)
Occupational Therapy Treatment Patient Details Name: Michael [name] MRN: [id number] DOB: 05-18-1960 Today's Date: 07/29/2021   History of present illness 61 y/o man presents to [facility] hospital 07/09/2021 with multiple recent admissions of progressive discititis and osteomyelitis, now with worsening pain. CT revealed progressive osteomyelitis/discititis at T8-9 compared to recent studies with bony erosion and possible abscesses. Pt underwent T5-11 fusion with T8-9 corpectomy on 5/18. PMH includes HTN, obesity.   OT comments  STAR Program OT/PT treatment:  Patient received in bed and required encouragement to participate due to patient stating he got up earlier with nursing and he had recently returned to bed. Patient instructed on AE training for LB dressing with reacher and socks aide with patient demonstrating good return demonstration. Patient's wife states she will bring clothing for OT to further address dressing with AE. Patient to continue with STAR program.    Recommendations for follow up therapy are one component of a multi-disciplinary discharge planning process, led by the attending physician.  Recommendations may be updated based on patient status, additional functional criteria and insurance authorization.    Follow Up Recommendations  Skilled nursing-short term rehab (<3 hours/day)    Assistance Recommended at Discharge Frequent or constant Supervision/Assistance  Patient can return home with the following  A little help with walking and/or transfers;A lot of help with bathing/dressing/bathroom;Assistance with cooking/housework;Direct supervision/assist for medications management;Direct supervision/assist for financial management   Equipment Recommendations  Other (comment) (AE for dressing)    Recommendations for Other Services      Precautions / Restrictions Precautions Precautions: Fall;Back Precaution Booklet Issued: No Precaution Comments: verbally reviewed log roll  technique Required Braces or Orthoses: Spinal Brace Spinal Brace: Thoracolumbosacral orthotic;Applied in sitting position Restrictions Weight Bearing Restrictions: No Other Position/Activity Restrictions: self limiting due to back pain he describes as excrutiating yet does not ask for pain meds       Mobility Bed Mobility                    Transfers                         Balance                                           ADL either performed or assessed with clinical judgement   ADL Overall ADL's : Needs assistance/impaired                     Lower Body Dressing: Moderate assistance;With adaptive equipment Lower Body Dressing Details (indicate cue type and reason): adaptive equipment training for LB dressing               General ADL Comments: training for AE for LB dressing    Extremity/Trunk Assessment              Vision       Perception     Praxis      Cognition                                                Exercises      Shoulder Instructions       General Comments VSS on RA  Pertinent Vitals/ Pain          Home Living                                          Prior Functioning/Environment              Frequency  Min 5X/week        Progress Toward Goals  OT Goals(current goals can now be found in the care plan section)  Progress towards OT goals: Progressing toward goals  Acute Rehab OT Goals Patient Stated Goal: go home OT Goal Formulation: With patient Time For Goal Achievement: 08/01/21 Potential to Achieve Goals: Fair ADL Goals Pt Will Perform Grooming: with set-up;sitting Pt Will Perform Lower Body Bathing: with min assist;sit to/from stand;sitting/lateral leans;with adaptive equipment Pt Will Perform Upper Body Dressing: with set-up;with supervision;sitting Pt Will Perform Lower Body Dressing: with mod assist;sit to/from  stand;sitting/lateral leans;with adaptive equipment Pt Will Transfer to Toilet: with min assist;with +2 assist;bedside commode Pt Will Perform Toileting - Clothing Manipulation and hygiene: with min assist;sitting/lateral leans;sit to/from stand;with adaptive equipment Additional ADL Goal #1: Bed mobility with min A +2 for ADL tasks Additional ADL Goal #2: Pt will demonstrate 3/3 back precautions with min vc during ADL session  Plan Discharge plan remains appropriate;Frequency remains appropriate    Co-evaluation    PT/OT/SLP Co-Evaluation/Treatment: Yes Reason for Co-Treatment: Necessary to address cognition/behavior during functional activity PT goals addressed during session: Mobility/safety with mobility OT goals addressed during session: ADL's and self-care      AM-PAC OT "6 Clicks" Daily Activity     Outcome Measure   Help from another person eating meals?: None Help from another person taking care of personal grooming?: None Help from another person toileting, which includes using toliet, bedpan, or urinal?: A Lot Help from another person bathing (including washing, rinsing, drying)?: A Lot Help from another person to put on and taking off regular upper body clothing?: A Lot Help from another person to put on and taking off regular lower body clothing?: A Lot 6 Click Score: 16    End of Session Equipment Utilized During Treatment: Gait belt;Rolling walker (2 wheels);Back brace  OT Visit Diagnosis: Unsteadiness on feet (R26.81);Other abnormalities of gait and mobility (R26.89);Muscle weakness (generalized) (M62.81);Pain   Activity Tolerance Patient tolerated treatment well   Patient Left in bed;with call bell/phone within reach;with bed alarm set;with nursing/sitter in room;with family/visitor present   Nurse Communication Mobility status        Time: 1340-1447 OT Time Calculation (min): 67 min  Charges: OT General Charges $OT Visit: 1 Visit OT Treatments $Self  Care/Home Management : 23-37 mins  Alfonse Flavors, OTA Acute Rehabilitation Services  Pager [phone] Office [phone]   Dewain Penning 07/29/2021, 4:00 PM

## 2021-07-29 NOTE — TOC Progression Note (Signed)
Transition of Care [facility]) - Progression Note    Patient Details  Name: Michael [name] MRN: [id number] Date of Birth: May 18, 1960  Transition of Care [facility]) CM/SW Contact  Eduard Roux, [place] Phone Number: 07/29/2021, 11:22 AM  Clinical Narrative:     Called left voice message for [facility]/SNF- recommendations has been updated and the patient will discharge home.  Antony Blackbird, MSW, LCSW Clinical Social Worker       Expected Discharge Plan: Home w Home Health Services Barriers to Discharge: Insurance Authorization  Expected Discharge Plan and Services Expected Discharge Plan: Home w Home Health Services     Post Acute Care Choice: Home Health Living arrangements for the past 2 months: Single Family Home Expected Discharge Date: 07/15/21                                     Social Determinants of Health (SDOH) Interventions    Readmission Risk Interventions     View : No data to display.

## 2021-07-29 NOTE — Progress Notes (Signed)
Physical Therapy Treatment Patient Details Name: Michael [name] MRN: [id number] DOB: [birth year] Today's Date: 07/29/2021   History of Present Illness 61 y/o man presents to [facility] hospital 07/09/2021 with multiple recent admissions of progressive discititis and osteomyelitis, now with worsening pain. CT revealed progressive osteomyelitis/discititis at T8-9 compared to recent studies with bony erosion and possible abscesses. Pt underwent T5-11 fusion with T8-9 corpectomy on 5/18. PMH includes HTN, obesity.    PT Comments    STAR PT Session: Pt initially hesitant but ultimately agreeable to working with therapy. Pt family present and provided additional support. Pt continues to progress his mobility with min-modA and increased encouragement, but is able to tolerate more ambulation today. Pt would like to go home but is realistic about his current limitations and is agreeable to SNF placement. 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 PT will work with him towards this goal.     Recommendations for follow up therapy are one component of a multi-disciplinary discharge planning process, led by the attending physician.  Recommendations may be updated based on patient status, additional functional criteria and insurance authorization.  Follow Up Recommendations  Skilled nursing-short term rehab (<3 hours/day)     Assistance Recommended at Discharge Frequent or constant Supervision/Assistance  Patient can return home with the following Assist for transportation;Help with stairs or ramp for entrance;Assistance with cooking/housework;A lot of help with walking and/or transfers;A lot of help with bathing/dressing/bathroom   Equipment Recommendations  Wheelchair (measurements PT);Wheelchair cushion (measurements PT);Hospital bed       Precautions / Restrictions Precautions Precautions: Fall;Back Precaution Booklet Issued: No Precaution Comments: verbally reviewed log roll  technique Required Braces or Orthoses: Spinal Brace Spinal Brace: Thoracolumbosacral orthotic;Applied in sitting position Restrictions Weight Bearing Restrictions: No Other Position/Activity Restrictions: self limiting due to back pain he describes as excrutiating yet does not ask for pain meds     Mobility  Bed Mobility Overal bed mobility: Needs Assistance Bed Mobility: Sidelying to Sit Rolling: Min guard Sidelying to sit: Min assist, HOB elevated, +2 for physical assistance   Sit to supine: Mod assist   General bed mobility comments: min A for bringing trunk to upright, modA for returning LE to bed    Transfers Overall transfer level: Needs assistance Equipment used: Rolling walker (2 wheels) Transfers: Sit to/from Stand, Bed to chair/wheelchair/BSC Sit to Stand: Min assist, +2 safety/equipment           General transfer comment: minAx2 for power up into standing at RW, vc for hand placement, able to perform from bed and recliner after rest during ambulation    Ambulation/Gait Ambulation/Gait assistance: +2 physical assistance, Min assist, Mod assist Gait Distance (Feet): 30 Feet (16) Assistive device: Rolling walker (2 wheels) Gait Pattern/deviations: Step-to pattern, Decreased step length - right, Decreased step length - left, Wide base of support, Step-through pattern Gait velocity: decr Gait velocity interpretation: <1.31 ft/sec, indicative of household ambulator   General Gait Details: min-modA for steadying with constant cuing for upright posture and proximity to RW      Balance Overall balance assessment: Needs assistance Sitting-balance support: Single extremity supported, Feet supported Sitting balance-Leahy Scale: Fair Sitting balance - Comments: UE support for pain   Standing balance support: Bilateral upper extremity supported, During functional activity Standing balance-Leahy Scale: Poor Standing balance comment: walker and min guard for static  standing  Cognition Arousal/Alertness: Awake/alert Behavior During Therapy: Anxious Overall Cognitive Status: History of cognitive impairments - at baseline                                 General Comments: continues his stream on negative talk, perseverating on reasons he can't mobilize even while he is actually moving        Exercises Other Exercises Other Exercises: uses LE to pedal himself back to room in the recliner    General Comments General comments (skin integrity, edema, etc.): VSS on RA      Pertinent Vitals/Pain Pain Assessment Pain Assessment: Faces Faces Pain Scale: Hurts a little bit Pain Location: back, rt shoulder Pain Descriptors / Indicators: Aching Pain Intervention(s): Limited activity within patient's tolerance     PT Goals (current goals can now be found in the care plan section) Acute Rehab PT Goals Patient Stated Goal: feel better PT Goal Formulation: With patient/family Time For Goal Achievement: 08/01/21 Potential to Achieve Goals: Fair Progress towards PT goals: Progressing toward goals    Frequency    Min 3X/week      PT Plan Current plan remains appropriate    Co-evaluation PT/OT/SLP Co-Evaluation/Treatment: Yes Reason for Co-Treatment: Necessary to address cognition/behavior during functional activity PT goals addressed during session: Mobility/safety with mobility        AM-PAC PT "6 Clicks" Mobility   Outcome Measure  Help needed turning from your back to your side while in a flat bed without using bedrails?: A Little Help needed moving from lying on your back to sitting on the side of a flat bed without using bedrails?: A Little Help needed moving to and from a bed to a chair (including a wheelchair)?: A Little Help needed standing up from a chair using your arms (e.g., wheelchair or bedside chair)?: A Little Help needed to walk in hospital room?: A Little Help needed  climbing 3-5 steps with a railing? : Total 6 Click Score: 16    End of Session Equipment Utilized During Treatment: Gait belt;Back brace Activity Tolerance: Other (comment);Patient limited by pain (self limiting) Patient left: in chair;with call bell/phone within reach;with chair alarm set Nurse Communication: Mobility status PT Visit Diagnosis: Pain;Difficulty in walking, not elsewhere classified (R26.2) Pain - Right/Left: Right Pain - part of body:  (back)     Time: 4650-3546 PT Time Calculation (min) (ACUTE ONLY): 30 min  Charges:  $Gait Training: 8-22 mins                     Destyn Schuyler B. Beverely Risen PT, DPT Acute Rehabilitation Services Please use secure chat or  Call Office [phone]    Elon Alas Fleet 07/29/2021, 3:52 PM

## 2021-07-30 DIAGNOSIS — M4624 Osteomyelitis of vertebra, thoracic region: Secondary | ICD-10-CM | POA: Diagnosis not present

## 2021-07-30 DIAGNOSIS — I10 Essential (primary) hypertension: Secondary | ICD-10-CM | POA: Diagnosis not present

## 2021-07-30 LAB — GLUCOSE, CAPILLARY
Glucose-Capillary: 106 mg/dL — ABNORMAL HIGH (ref 70–99)
Glucose-Capillary: 124 mg/dL — ABNORMAL HIGH (ref 70–99)
Glucose-Capillary: 103 mg/dL — ABNORMAL HIGH (ref 70–99)
Glucose-Capillary: 118 mg/dL — ABNORMAL HIGH (ref 70–99)
Glucose-Capillary: 149 mg/dL — ABNORMAL HIGH (ref 70–99)
Glucose-Capillary: 90 mg/dL (ref 70–99)

## 2021-07-30 NOTE — Progress Notes (Signed)
Occupational Therapy Treatment Patient Details Name: Michael [name] MRN: [id number] DOB: 1960-04-25 Today's Date: 07/30/2021   History of present illness 61 y/o man presents to [facility] hospital 07/09/2021 with multiple recent admissions of progressive discititis and osteomyelitis, now with worsening pain. CT revealed progressive osteomyelitis/discititis at T8-9 compared to recent studies with bony erosion and possible abscesses. Pt underwent T5-11 fusion with T8-9 corpectomy on 5/18. PMH includes HTN, obesity.   OT comments  STAR program OT treatment:  Patient received in bed and agreeable to OT session but required increased time to begin session. Patient attempted to limit treatment to EOB sitting but was encourage to sit OOB to increase tolerance. Patient instructed on AE use for LB dressing with reacher and sock aide. Patient continues to require mod assist but required fewer cues and has potential to improve. Patient unable to recall back precautions without verbal cues and was instructed in UE exercises to increase activity tolerance. Patient to continue with STAR program.    Recommendations for follow up therapy are one component of a multi-disciplinary discharge planning process, led by the attending physician.  Recommendations may be updated based on patient status, additional functional criteria and insurance authorization.    Follow Up Recommendations  Skilled nursing-short term rehab (<3 hours/day)    Assistance Recommended at Discharge Frequent or constant Supervision/Assistance  Patient can return home with the following  A little help with walking and/or transfers;A lot of help with bathing/dressing/bathroom;Assistance with cooking/housework;Direct supervision/assist for medications management;Direct supervision/assist for financial management   Equipment Recommendations  Other (comment) (AE for dressing)    Recommendations for Other Services      Precautions / Restrictions  Precautions Precautions: Fall;Back Precaution Booklet Issued: No Precaution Comments: verbally reviewed log roll technique Required Braces or Orthoses: Spinal Brace Spinal Brace: Thoracolumbosacral orthotic;Applied in sitting position Restrictions Weight Bearing Restrictions: No Other Position/Activity Restrictions: self limiting due to back pain he describes as excrutiating yet does not ask for pain meds       Mobility Bed Mobility Overal bed mobility: Needs Assistance Bed Mobility: Sidelying to Sit Rolling: Min guard Sidelying to sit: Min assist, HOB elevated       General bed mobility comments: min assist to lift trunk with HOB raised    Transfers Overall transfer level: Needs assistance Equipment used: Rolling walker (2 wheels) Transfers: Sit to/from Stand, Bed to chair/wheelchair/BSC Sit to Stand: Min assist     Step pivot transfers: Min assist     General transfer comment: Patient asked for nursing to help but was able to perform step pivot transfer from EOB to recliner with min assist of one     Balance Overall balance assessment: Needs assistance Sitting-balance support: Single extremity supported, Feet supported Sitting balance-Leahy Scale: Fair Sitting balance - Comments: UE support for pain   Standing balance support: Bilateral upper extremity supported, During functional activity Standing balance-Leahy Scale: Poor Standing balance comment: reliant on UE support when standing                           ADL either performed or assessed with clinical judgement   ADL Overall ADL's : Needs assistance/impaired     Grooming: Wash/dry face;Wash/dry hands;Set up;Sitting Grooming Details (indicate cue type and reason): in recliner         Upper Body Dressing : Maximal assistance;Sitting Upper Body Dressing Details (indicate cue type and reason): to don brace Lower Body Dressing: Moderate assistance;With adaptive equipment Lower Body  Dressing  Details (indicate cue type and reason): adaptive equipment training for LB dressing               General ADL Comments: continued AE training for LB dressing with fewer verbal cues    Extremity/Trunk Assessment              Vision       Perception     Praxis      Cognition Arousal/Alertness: Awake/alert Behavior During Therapy: Anxious Overall Cognitive Status: History of cognitive impairments - at baseline                                 General Comments: increased time to perform tasks due to procrastination and negative talk of abilities        Exercises Exercises: General Upper Extremity General Exercises - Upper Extremity Shoulder Flexion: AAROM, Both, 10 reps Shoulder ABduction: Strengthening, Both, 10 reps, Seated, Theraband Theraband Level (Shoulder Abduction): Level 1 (Yellow)    Shoulder Instructions       General Comments VSS on RA    Pertinent Vitals/ Pain       Pain Assessment Pain Assessment: Faces Faces Pain Scale: Hurts a little bit Pain Location: back, rt shoulder Pain Descriptors / Indicators: Aching Pain Intervention(s): Limited activity within patient's tolerance, Monitored during session, Repositioned  Home Living                                          Prior Functioning/Environment              Frequency  Min 5X/week        Progress Toward Goals  OT Goals(current goals can now be found in the care plan section)  Progress towards OT goals: Progressing toward goals  Acute Rehab OT Goals Patient Stated Goal: go home OT Goal Formulation: With patient Time For Goal Achievement: 08/01/21 Potential to Achieve Goals: Fair ADL Goals Pt Will Perform Grooming: with set-up;sitting Pt Will Perform Lower Body Bathing: with min assist;sit to/from stand;sitting/lateral leans;with adaptive equipment Pt Will Perform Upper Body Dressing: with set-up;with supervision;sitting Pt Will Perform Lower  Body Dressing: with mod assist;sit to/from stand;sitting/lateral leans;with adaptive equipment Pt Will Transfer to Toilet: with min assist;with +2 assist;bedside commode Pt Will Perform Toileting - Clothing Manipulation and hygiene: with min assist;sitting/lateral leans;sit to/from stand;with adaptive equipment Additional ADL Goal #1: Bed mobility with min A +2 for ADL tasks Additional ADL Goal #2: Pt will demonstrate 3/3 back precautions with min vc during ADL session  Plan Discharge plan remains appropriate;Frequency remains appropriate    Co-evaluation                 AM-PAC OT "6 Clicks" Daily Activity     Outcome Measure   Help from another person eating meals?: None Help from another person taking care of personal grooming?: None Help from another person toileting, which includes using toliet, bedpan, or urinal?: A Lot Help from another person bathing (including washing, rinsing, drying)?: A Lot Help from another person to put on and taking off regular upper body clothing?: A Lot Help from another person to put on and taking off regular lower body clothing?: A Lot 6 Click Score: 16    End of Session Equipment Utilized During Treatment: Gait belt;Rolling walker (2 wheels);Back brace  OT  Visit Diagnosis: Unsteadiness on feet (R26.81);Other abnormalities of gait and mobility (R26.89);Muscle weakness (generalized) (M62.81);Pain Pain - Right/Left: Right Pain - part of body: Shoulder   Activity Tolerance Patient tolerated treatment well   Patient Left in chair;with call bell/phone within reach   Nurse Communication Mobility status        Time: 0277-4128 OT Time Calculation (min): 47 min  Charges: OT General Charges $OT Visit: 1 Visit OT Treatments $Self Care/Home Management : 23-37 mins $Therapeutic Exercise: 8-22 mins  Alfonse Flavors, OTA Acute Rehabilitation Services  Pager [phone] Office [phone]   Dewain Penning 07/30/2021, 9:38 AM

## 2021-07-30 NOTE — Progress Notes (Signed)
Patient is still refusing most of his medications.

## 2021-07-30 NOTE — Progress Notes (Signed)
[facility]                                                                               Michael [name], is a 61 y.o. male, DOB - 1960-08-06, GBT:[id number] Admit date - 07/09/2021    Outpatient Primary MD for the patient is [name], PA  LOS - 21  days    Brief summary   Patient is a 61 year old male with history of T8-9 discitis/osteomyelitis, E. coli bacteremia presented to [facility] ER with complaint of pain.  CT imaging again showed T8-T9 osteomyelitis, discitis, now with evidence of bony erosion, progression since prior.  He was transferred to [facility] for neurosurgery evaluation, admitted under [facility] service.  Patient was started on broad-spectrum antibiotics, ID consulted.  Patient underwent T5-T11 fusion, T8-T9 corpectomy by neurosurgery and was transferred to ICU for postop stay.  Hospital course remarkable for poor mobility.  PT/OT recommending skilled nursing facility on discharge.  Medically stable for discharge and TOC now assisting with SNF placement (auth pending).   Assessment & Plan    Assessment and Plan:   Thoracic vertebral osteomyelitis CT imaging shows T8-T9 osteomyelitis with discitis with evidence of bony erosion. Patient underwent T5-T11 fusion, T8-T9 corpectomy by neurosurgery on 07/17/2021.  Neurosurgery recommends TLSO brace when out of bed.  Blood cultures have been negative so far.  Infectious disease on board recommends Rocephin till 09/04/2021.  PICC line placed. Pain controlled supportive care.  Therapy evaluations recommending SNF   Acute blood loss anemia s/p 1 unit of PRBC transfusion Transfuse to keep hemoglobin greater than 7. Hemoglobin stable around 9   Oral candidiasis Treated with nystatin.  AKI secondary to poor oral intake and dehydration Resolved.   DepressED mood Evaluated by psychiatry on 07/14/2021.  Patient denies any suicidal ideations or homicidal ideations. Pain control.  She  Debility and  deconditioning Therapy evaluations recommending SNF.  Currently waiting for placement.   Essential hypertension Blood pressure parameters appear to be optimal   RN Pressure Injury Documentation:    Malnutrition Type:  Nutrition Problem: Increased nutrient needs Etiology: post-op healing   Malnutrition Characteristics:  Signs/Symptoms: estimated needs   Nutrition Interventions:  Interventions: Ensure Enlive (each supplement provides 350kcal and 20 grams of protein), MVI  Estimated body mass index is 38.71 kg/m as calculated from the following:   Height as of this encounter: 5\' 11"  (1.803 m).   Weight as of this encounter: 125.9 kg.  Code Status: fULL CODE.  DVT Prophylaxis:  heparin injection 5,000 Units Start: 07/21/21 1400 Place and maintain sequential compression device Start: 07/17/21 1833 SCD's Start: 07/17/21 1738   Level of Care: Level of care: Telemetry Medical Family Communication: none at bedside.   Disposition Plan:     Remains inpatient appropriate:  waiting for SNF.   Procedures:  T5-T11 fusion with T8-T9 corpectomy  Consultants:   Infectious disease Neurosurgery PCCM  Antimicrobials:   Anti-infectives (From admission, onward)    Start     Dose/Rate Route Frequency Ordered Stop   07/29/21 0000  cefTRIAXone (ROCEPHIN) IVPB        2 g Intravenous Every 24 hours 07/29/21 1149 09/11/21 2359  07/17/21 1830  ceFAZolin (ANCEF) IVPB 2g/100 mL premix  Status:  Discontinued        2 g 200 mL/hr over 30 Minutes Intravenous Every 8 hours 07/17/21 1737 07/17/21 1744   07/17/21 1745  cefTRIAXone (ROCEPHIN) 2 g in sodium chloride 0.9 % 100 mL IVPB        2 g 200 mL/hr over 30 Minutes Intravenous Every 24 hours 07/17/21 1745     07/16/21 2234  ceFAZolin (ANCEF) IVPB 3g/100 mL premix        3 g 200 mL/hr over 30 Minutes Intravenous 30 min pre-op 07/16/21 2234 07/17/21 1226   07/11/21 1700  vancomycin (VANCOCIN) IVPB 1000 mg/200 mL premix  Status:   Discontinued        1,000 mg 200 mL/hr over 60 Minutes Intravenous Every 12 hours 07/11/21 0923 07/11/21 1257   07/11/21 1345  DAPTOmycin (CUBICIN) 750 mg in sodium chloride 0.9 % IVPB  Status:  Discontinued        8 mg/kg  95.5 kg (Adjusted) 130 mL/hr over 30 Minutes Intravenous Daily 07/11/21 1257 07/22/21 0909   07/11/21 1345  cefTRIAXone (ROCEPHIN) 2 g in sodium chloride 0.9 % 100 mL IVPB  Status:  Discontinued        2 g 200 mL/hr over 30 Minutes Intravenous Every 24 hours 07/11/21 1257 07/17/21 1745   07/10/21 1400  vancomycin (VANCOREADY) IVPB 1250 mg/250 mL  Status:  Discontinued        1,250 mg 166.7 mL/hr over 90 Minutes Intravenous Every 12 hours 07/09/21 2249 07/11/21 0923   07/09/21 2230  piperacillin-tazobactam (ZOSYN) IVPB 3.375 g  Status:  Discontinued        3.375 g 12.5 mL/hr over 240 Minutes Intravenous Every 8 hours 07/09/21 2137 07/11/21 1257        Medications  Scheduled Meds:  sodium chloride   Intravenous Once   acetaminophen  1,000 mg Oral Q6H   Chlorhexidine Gluconate Cloth  6 each Topical Daily   feeding supplement  237 mL Oral TID BM   gabapentin  300 mg Oral TID   heparin injection (subcutaneous)  5,000 Units Subcutaneous Q8H   lisinopril  5 mg Oral Daily   And   hydrochlorothiazide  6.25 mg Oral Daily   insulin aspart  2-6 Units Subcutaneous Q4H   multivitamin with minerals  1 tablet Oral Daily   pantoprazole  40 mg Oral Daily   polyethylene glycol  17 g Oral BID   sodium chloride flush  10-40 mL Intracatheter Q12H   sodium chloride flush  3 mL Intravenous Q12H   Continuous Infusions:  sodium chloride Stopped (07/17/21 2225)   cefTRIAXone (ROCEPHIN)  IV 2 g (07/29/21 1957)   methocarbamol (ROBAXIN) IV Stopped (07/22/21 0050)   PRN Meds:.acetaminophen **OR** acetaminophen, bisacodyl, diphenhydrAMINE, hydrALAZINE, menthol-cetylpyridinium **OR** phenol, methocarbamol **OR** methocarbamol (ROBAXIN) IV, mineral oil-hydrophilic petrolatum,  ondansetron, oxyCODONE, prochlorperazine, sodium chloride flush, sodium chloride flush, traZODone    Subjective:   Michael [name] was seen and examined today.  Pt refusing meds.   Objective:   Vitals:   07/29/21 2253 07/30/21 0304 07/30/21 0836 07/30/21 1136  BP: 118/79 113/81 111/88 127/79  Pulse: 98 90 99 96  Resp: 17 15 16 17   Temp: 98.2 F (36.8 C) 98.3 F (36.8 C) 98 F (36.7 C) 97.7 F (36.5 C)  TempSrc: Oral Oral Oral Oral  SpO2: 97% 100% 98% 92%  Weight:      Height:        Intake/Output  Summary (Last 24 hours) at 07/30/2021 1318 Last data filed at 07/30/2021 0946 Gross per 24 hour  Intake 487 ml  Output 1300 ml  Net -813 ml   Filed Weights   07/09/21 2200  Weight: 125.9 kg     Exam General exam: elderly gentleman not in distress.  Respiratory system: Clear to auscultation. Respiratory effort normal. Cardiovascular system: S1 & S2 heard, RRR. No JVD,No pedal edema. Gastrointestinal system: Abdomen is nondistended, soft and nontender.  Normal bowel sounds heard. Central nervous system: Alert , confused.  Extremities: Symmetric 5 x 5 power. Picc Line in the RUE.  Skin: No rashes,  Psychiatry:  anxious. .     Data Reviewed:  I have personally reviewed following labs and imaging studies   CBC Lab Results  Component Value Date   WBC 9.5 07/29/2021   RBC 3.04 (L) 07/29/2021   HGB 9.3 (L) 07/29/2021   HCT 30.2 (L) 07/29/2021   MCV 99.3 07/29/2021   MCH 30.6 07/29/2021   PLT 320 07/29/2021   MCHC 30.8 07/29/2021   RDW 18.7 (H) 07/29/2021   LYMPHSABS 2.0 07/29/2021   MONOABS 0.8 07/29/2021   EOSABS 0.3 07/29/2021   BASOSABS 0.1 07/29/2021     Last metabolic panel Lab Results  Component Value Date   NA 140 07/29/2021   K 3.8 07/29/2021   CL 107 07/29/2021   CO2 27 07/29/2021   BUN 9 07/29/2021   CREATININE 0.84 07/29/2021   GLUCOSE 107 (H) 07/29/2021   GFRNONAA >60 07/29/2021   CALCIUM 8.9 07/29/2021   PHOS 2.9 06/25/2021   PROT 5.3 (L)  07/21/2021   ALBUMIN 2.7 (L) 07/21/2021   BILITOT 0.8 07/21/2021   ALKPHOS 65 07/21/2021   AST 19 07/21/2021   ALT 18 07/21/2021   ANIONGAP 6 07/29/2021    CBG (last 3)  Recent Labs    07/30/21 0307 07/30/21 0838 07/30/21 1135  GLUCAP 106* 124* 103*      Coagulation Profile: No results for input(s): INR, PROTIME in the last 168 hours.   Radiology Studies: No results found.     Kathlen Mody M.D. [facility] 07/30/2021, 1:18 PM  Available via Epic secure chat 7am-7pm After 7 pm, please refer to night coverage provider listed on amion.

## 2021-07-30 NOTE — Progress Notes (Addendum)
Physical Therapy Treatment Patient Details Name: Michael [name] MRN: [id number] DOB: November 16, 1960 Today's Date: 07/30/2021   History of Present Illness 61 y/o man presents to [facility] hospital 07/09/2021 with multiple recent admissions of progressive discititis and osteomyelitis, now with worsening pain. CT revealed progressive osteomyelitis/discititis at T8-9 compared to recent studies with bony erosion and possible abscesses. Pt underwent T5-11 fusion with T8-9 corpectomy on 5/18. PMH includes HTN, obesity.    PT Comments    STAR PT Session: Pt continues to be limited by negative self talk and self limiting behavior requiring direct instruction of mobility task immediately at hand. Pt with increased worry about next steps. Pt is stronger and more capable than he allows himself to think. Pt does continue to need increased rest breaks for recovery after short distance ambulation. Pt is min guard for bed mobility, min A for transfers and min A for ambulation with RW except for 1x LoB requiring modA for maintaining balance. Pt continues to need SNF level rehab for safety due to his decreased balance and endurance. PT will continue to follow acutely.    Recommendations for follow up therapy are one component of a multi-disciplinary discharge planning process, led by the attending physician.  Recommendations may be updated based on patient status, additional functional criteria and insurance authorization.  Follow Up Recommendations  Skilled nursing-short term rehab (<3 hours/day)     Assistance Recommended at Discharge Frequent or constant Supervision/Assistance  Patient can return home with the following Assist for transportation;Help with stairs or ramp for entrance;Assistance with cooking/housework;A lot of help with walking and/or transfers;A lot of help with bathing/dressing/bathroom   Equipment Recommendations  Wheelchair (measurements PT);Wheelchair cushion (measurements PT);Hospital bed     Recommendations for Other Services       Precautions / Restrictions Precautions Precautions: Fall;Back Precaution Booklet Issued: No Precaution Comments: verbally reviewed log roll technique, however pt ignores to get himself when moving in and out of bed Required Braces or Orthoses: Spinal Brace Spinal Brace: Thoracolumbosacral orthotic;Applied in sitting position Restrictions Weight Bearing Restrictions: No     Mobility  Bed Mobility Overal bed mobility: Needs Assistance Bed Mobility: Sidelying to Sit Rolling: Min guard   Supine to sit: Min guard, HOB elevated Sit to supine: Min guard, HOB elevated   General bed mobility comments: pt does not follow back precautions however is able to get in and out of bed with only min guard    Transfers Overall transfer level: Needs assistance Equipment used: Rolling walker (2 wheels) Transfers: Sit to/from Stand, Bed to chair/wheelchair/BSC Sit to Stand: Min assist           General transfer comment: minA for power up and steadying in standing, pt does not actually need the assist however "I feel better if you are at least holding him"    Ambulation/Gait Ambulation/Gait assistance: Min assist, Mod assist (maximal verbal encouragement required) Gait Distance (Feet): 12 Feet (+30, +30,+30,+30) Assistive device: Rolling walker (2 wheels) Gait Pattern/deviations: Step-through pattern, Decreased step length - right, Decreased step length - left Gait velocity: decr Gait velocity interpretation: <1.31 ft/sec, indicative of household ambulator   General Gait Details: min A for steadying with gait, 1x knee buckling requiring ModA to steady before turning to sit in recliner, pt requiring increased time to recover from bouts of walking due to increased negative thoughts and self limitation       Balance Overall balance assessment: Needs assistance Sitting-balance support: Single extremity supported, Feet supported Sitting  balance-Leahy Scale: Fair  Sitting balance - Comments: UE support for pain   Standing balance support: Bilateral upper extremity supported, During functional activity Standing balance-Leahy Scale: Poor Standing balance comment: reliant on UE support when standing                            Cognition Arousal/Alertness: Awake/alert Behavior During Therapy: Anxious Overall Cognitive Status: History of cognitive impairments - at baseline                                 General Comments: increased time to perform tasks due to procrastination and negative talk of abilities           General Comments General comments (skin integrity, edema, etc.): HR to 144 bpm with final bout of ambulation      Pertinent Vitals/Pain Pain Assessment Pain Assessment: Faces Faces Pain Scale: Hurts a little bit Pain Location: back, b LE Pain Descriptors / Indicators: Aching Pain Intervention(s): Limited activity within patient's tolerance, Monitored during session, Repositioned     PT Goals (current goals can now be found in the care plan section) Acute Rehab PT Goals PT Goal Formulation: With patient/family Time For Goal Achievement: 08/01/21 Potential to Achieve Goals: Fair Progress towards PT goals: Progressing toward goals    Frequency    Min 3X/week      PT Plan Current plan remains appropriate       AM-PAC PT "6 Clicks" Mobility   Outcome Measure  Help needed turning from your back to your side while in a flat bed without using bedrails?: A Little Help needed moving from lying on your back to sitting on the side of a flat bed without using bedrails?: A Little Help needed moving to and from a bed to a chair (including a wheelchair)?: A Little Help needed standing up from a chair using your arms (e.g., wheelchair or bedside chair)?: A Little Help needed to walk in hospital room?: A Little Help needed climbing 3-5 steps with a railing? : Total 6 Click  Score: 16    End of Session Equipment Utilized During Treatment: Gait belt;Back brace Activity Tolerance: Other (comment);Patient limited by pain Patient left: in bed;with call bell/phone within reach;with bed alarm set Nurse Communication: Mobility status PT Visit Diagnosis: Pain;Difficulty in walking, not elsewhere classified (R26.2) Pain - Right/Left: Right Pain - part of body:  (back)     Time: 4008-6761 PT Time Calculation (min) (ACUTE ONLY): 54 min  Charges:  $Gait Training: 23-37 mins $Therapeutic Exercise: 8-22 mins $Therapeutic Activity: 8-22 mins                     Oziel Beitler B. Beverely Risen PT, DPT Acute Rehabilitation Services Please use secure chat or  Call Office [phone]    Elon Alas [facility] 07/30/2021, 3:56 PM

## 2021-07-31 DIAGNOSIS — M4624 Osteomyelitis of vertebra, thoracic region: Secondary | ICD-10-CM | POA: Diagnosis not present

## 2021-07-31 DIAGNOSIS — I10 Essential (primary) hypertension: Secondary | ICD-10-CM | POA: Diagnosis not present

## 2021-07-31 LAB — GLUCOSE, CAPILLARY
Glucose-Capillary: 104 mg/dL — ABNORMAL HIGH (ref 70–99)
Glucose-Capillary: 137 mg/dL — ABNORMAL HIGH (ref 70–99)
Glucose-Capillary: 152 mg/dL — ABNORMAL HIGH (ref 70–99)

## 2021-07-31 MED ORDER — OXYCODONE HCL 5 MG PO TABS: 5.0000 mg | ORAL_TABLET | ORAL | 0 refills | Status: AC | PRN

## 2021-07-31 NOTE — Progress Notes (Signed)
Per order pt staples removed from back. Incision clean dry and intact with no drainage. Left open to air. Pt tolerated well.

## 2021-07-31 NOTE — Progress Notes (Signed)
Subjective: Patient reports improving pain overall but continues to be reluctant to increase his mobilization. No acute events overnight.   Objective: Vital signs in last 24 hours: Temp:  [97.7 F (36.5 C)-98.4 F (36.9 C)] 97.8 F (36.6 C) (06/01 0309) Pulse Rate:  [96-103] 99 (06/01 0309) Resp:  [13-20] 13 (06/01 0309) BP: (111-133)/(79-97) 117/89 (06/01 0309) SpO2:  [92 %-99 %] 97 % (06/01 0309)  Intake/Output from previous day: 05/31 0701 - 06/01 0700 In: 200 [P.O.:200] Out: 700 [Urine:700] Intake/Output this shift: No intake/output data recorded.  Physical Exam: Patient is awake, A/O X 4, and conversant. He has a flat affect but appears in better spirits today. He is in NAD. VSS.  Speech is fluent and appropriate. MAEW. BUE 4+/5 throughout, BLE 4+/5 throughout. Sensation to light touch is intact. PERLA, EOMI. CNs grossly intact. Dressing is clean dry intact. Incision is well approximated with no drainage, erythema, or edema.   Lab Results: Recent Labs    07/29/21 0336  WBC 9.5  HGB 9.3*  HCT 30.2*  PLT 320   BMET Recent Labs    07/29/21 0336  NA 140  K 3.8  CL 107  CO2 27  GLUCOSE 107*  BUN 9  CREATININE 0.84  CALCIUM 8.9    Studies/Results: No results found.  Assessment/Plan: Patient is s/p T5-11 fusion with T8-9 corpectomy due to osteodiscitis with kyphosis and stenosis on 07/17/2021. He is continuing to recover well from his surgery but continues to decline attempts at mobilization. He sat up in the bedside chair for approximately one hour this morning. PT/OT recommending SNF. His neurological exam remains stable. PT/OT recommending SNF for latter convalescence. He appears to be more amenable to SNF placement today where before he requested to be discharged home.     -Posterior thoracic staples removed  -TLSO when OOB -PT/OT  -Pain control, wean IV analgesics  -Encourage mobility and provide opportunities to mobilize throughout the day     LOS: 22  days     Council Mechanic, DNP, AGNP-C Neurosurgery Nurse Practitioner  [facility] [address], [place], [place] [address] P: [phone]    F: [phone]  07/31/2021 8:12 AM

## 2021-07-31 NOTE — TOC Transition Note (Addendum)
Transition of Care [facility]) - CM/SW Discharge Note   Patient Details  Name: Michael [name] MRN: [id number] Date of Birth: 1960/05/18  Transition of Care [facility]) CM/SW Contact:  Eduard Roux, LCSW Phone Number: 07/31/2021, 2:08 PM   Clinical Narrative:     Patient will Discharge to: [facility]  Discharge Date: 07/31/2021 Family Notified: unable to reach family Transport [id number]  Please make sure script goes with the patient.  Per MD patient is ready for discharge. RN, patient, and facility notified of discharge. Discharge Summary sent to facility. RN given number for [phone]. Ambulance transport requested for patient.   Clinical Social Worker signing off.  Antony Blackbird, MSW, LCSW Clinical Social Worker     Final next level of care: Skilled Nursing Facility Barriers to Discharge: Barriers Resolved   Patient Goals and CMS Choice Patient states their goals for this hospitalization and ongoing recovery are:: Pt states he wants to return home.   Choice offered to / list presented to : Patient, Spouse  Discharge Placement              Patient chooses bed at: [facility] Patient to be transferred to facility by: [facility] Name of family member notified: sent message to spouse Patient and family notified of of transfer: 07/31/21  Discharge Plan and Services     Post Acute Care Choice: Home Health                               Social Determinants of Health (SDOH) Interventions     Readmission Risk Interventions     View : No data to display.

## 2021-07-31 NOTE — TOC Progression Note (Signed)
Transition of Care [facility]) - Progression Note    Patient Details  Name: Michael [name] MRN: [id number] Date of Birth: 05-02-1960  Transition of Care [facility]) CM/SW [place], [place] Phone Number: 07/31/2021, 1:57 PM  Clinical Narrative:     CSW met with patient at bedside. CSW informed patient, SNF has received authorization for short term rehab at [facility]. Patient states his preference is to leave tomorrow because he was not ready to leave today and wanted his spouse to be here. CSW reminded patient per MD he is medically stable for discharge, including, his insurance is no longer covering his hospital stay as previously advised. Patient states understanding.   Patient and CSW unable to reach spouse. CSW sent HIPPA  compliant text (patient is discharging today).  Thurmond Butts, MSW, LCSW Clinical Social Worker     Expected Discharge Plan: Skilled Nursing Facility Barriers to Discharge: Barriers Resolved  Expected Discharge Plan and Services Expected Discharge Plan: [place] Choice: [place] arrangements for the past 2 months: Single Family Home Expected Discharge Date: 07/31/21                                     Social Determinants of Health (SDOH) Interventions    Readmission Risk Interventions     View : No data to display.

## 2021-07-31 NOTE — Progress Notes (Signed)
Pt continues to refuse majority of medications (see eMAR). Education provided continues to refuse.

## 2021-07-31 NOTE — Progress Notes (Signed)
Occupational Therapy Treatment Patient Details Name: Michael [name] MRN: [id number] DOB: May 22, 1960 Today's Date: 07/31/2021   History of present illness 61 y/o man presents to [facility] hospital 07/09/2021 with multiple recent admissions of progressive discititis and osteomyelitis, now with worsening pain. CT revealed progressive osteomyelitis/discititis at T8-9 compared to recent studies with bony erosion and possible abscesses. Pt underwent T5-11 fusion with T8-9 corpectomy on 5/18. PMH includes HTN, obesity.   OT comments  STAR Program OT session: Patient continues to have negative speech and attempts to decline getting out of bed. Patient's complaints for not wanting to get out of bed are situations that are due to being in bed; complains of weak BLEs, poor circulation in BLEs. Patient able to get to EOB with increased time and min guard with use of rail and poor carryover of log rolling technique. Patient donned pullover paper scrub top with mod assist and max assist to donn brace. Patient was min assist to stand and transfer to recliner with RW. LB dressing performed with reacher and max assist to pull up while standing due to fearful of letting go of walker. Patient instructed in BUE HEP to increase activity tolerance. Patient to continue with STAR program.    Recommendations for follow up therapy are one component of a multi-disciplinary discharge planning process, led by the attending physician.  Recommendations may be updated based on patient status, additional functional criteria and insurance authorization.    Follow Up Recommendations  Skilled nursing-short term rehab (<3 hours/day)    Assistance Recommended at Discharge Frequent or constant Supervision/Assistance  Patient can return home with the following  A little help with walking and/or transfers;A lot of help with bathing/dressing/bathroom;Assistance with cooking/housework;Direct supervision/assist for medications management;Direct  supervision/assist for financial management   Equipment Recommendations  Other (comment) (AE for dressing)    Recommendations for Other Services      Precautions / Restrictions Precautions Precautions: Fall;Back Precaution Booklet Issued: No Precaution Comments: poor carryover with back precautions with bed mobility Required Braces or Orthoses: Spinal Brace Spinal Brace: Thoracolumbosacral orthotic;Applied in sitting position Restrictions Weight Bearing Restrictions: No       Mobility Bed Mobility Overal bed mobility: Needs Assistance Bed Mobility: Sidelying to Sit   Sidelying to sit: HOB elevated, Min guard       General bed mobility comments: HOB raised, rails uses and min guard to guide up to sitting.    Transfers Overall transfer level: Needs assistance Equipment used: Rolling walker (2 wheels) Transfers: Sit to/from Stand, Bed to chair/wheelchair/BSC Sit to Stand: Min assist     Step pivot transfers: Min assist     General transfer comment: min assist for safety and no LOB     Balance Overall balance assessment: Needs assistance Sitting-balance support: Single extremity supported, Feet supported Sitting balance-Leahy Scale: Fair Sitting balance - Comments: UE support for pain   Standing balance support: Bilateral upper extremity supported, During functional activity Standing balance-Leahy Scale: Poor Standing balance comment: reliant on UE support when standing                           ADL either performed or assessed with clinical judgement   ADL Overall ADL's : Needs assistance/impaired     Grooming: Wash/dry hands;Wash/dry face;Oral care;Set up;Brushing hair;Sitting Grooming Details (indicate cue type and reason): in recliner         Upper Body Dressing : Moderate assistance;Sitting Upper Body Dressing Details (indicate cue type and reason):  pullover top mod assist; max assist with brace Lower Body Dressing: Moderate  assistance;With adaptive equipment Lower Body Dressing Details (indicate cue type and reason): able to thread legs into clothing with reacher and requires max assist to pull up when standing due to reliant on RW for support               General ADL Comments: Dressing addressed with paper scrubs due to family have not brought clothing in for him to use    Extremity/Trunk Assessment              Vision       Perception     Praxis      Cognition Arousal/Alertness: Awake/alert Behavior During Therapy: Anxious Overall Cognitive Status: History of cognitive impairments - at baseline                                 General Comments: increased time to perform tasks due to procrastination and negative talk of abilities        Exercises Exercises: General Upper Extremity General Exercises - Upper Extremity Shoulder Flexion: AAROM, Both, 10 reps Shoulder ABduction: Strengthening, Both, 10 reps, Seated, Theraband Theraband Level (Shoulder Abduction): Level 1 (Yellow)    Shoulder Instructions       General Comments      Pertinent Vitals/ Pain       Pain Assessment Pain Assessment: Faces Faces Pain Scale: Hurts a little bit Pain Location: back, b LE Pain Descriptors / Indicators: Aching Pain Intervention(s): Monitored during session, Repositioned  Home Living                                          Prior Functioning/Environment              Frequency  Min 5X/week        Progress Toward Goals  OT Goals(current goals can now be found in the care plan section)  Progress towards OT goals: Progressing toward goals  Acute Rehab OT Goals Patient Stated Goal: go home OT Goal Formulation: With patient Time For Goal Achievement: 08/01/21 Potential to Achieve Goals: Fair ADL Goals Pt Will Perform Grooming: with set-up;sitting Pt Will Perform Lower Body Bathing: with min assist;sit to/from stand;sitting/lateral leans;with  adaptive equipment Pt Will Perform Upper Body Dressing: with set-up;with supervision;sitting Pt Will Perform Lower Body Dressing: with mod assist;sit to/from stand;sitting/lateral leans;with adaptive equipment Pt Will Transfer to Toilet: with min assist;with +2 assist;bedside commode Pt Will Perform Toileting - Clothing Manipulation and hygiene: with min assist;sitting/lateral leans;sit to/from stand;with adaptive equipment Additional ADL Goal #1: Bed mobility with min A +2 for ADL tasks Additional ADL Goal #2: Pt will demonstrate 3/3 back precautions with min vc during ADL session  Plan Discharge plan remains appropriate;Frequency remains appropriate    Co-evaluation                 AM-PAC OT "6 Clicks" Daily Activity     Outcome Measure   Help from another person eating meals?: None Help from another person taking care of personal grooming?: None Help from another person toileting, which includes using toliet, bedpan, or urinal?: A Lot Help from another person bathing (including washing, rinsing, drying)?: A Lot Help from another person to put on and taking off regular upper body clothing?: A Lot Help from  another person to put on and taking off regular lower body clothing?: A Lot 6 Click Score: 16    End of Session Equipment Utilized During Treatment: Gait belt;Rolling walker (2 wheels);Back brace  OT Visit Diagnosis: Unsteadiness on feet (R26.81);Other abnormalities of gait and mobility (R26.89);Muscle weakness (generalized) (M62.81);Pain Pain - Right/Left: Right Pain - part of body: Shoulder   Activity Tolerance Other (comment);Patient tolerated treatment well (self limiting)   Patient Left in chair;with call bell/phone within reach;with chair alarm set   Nurse Communication Mobility status        Time: 7106-2694 OT Time Calculation (min): 44 min  Charges: OT General Charges $OT Visit: 1 Visit OT Treatments $Self Care/Home Management : 23-37 mins $Therapeutic  Exercise: 8-22 mins  Alfonse Flavors, OTA Acute Rehabilitation Services  Pager [phone] Office [phone]   Dewain Penning 07/31/2021, 9:49 AM

## 2021-07-31 NOTE — Progress Notes (Signed)
Physical Therapy Treatment Patient Details Name: Michael [name] MRN: [id number] DOB: [birth year] Today's Date: 07/31/2021   History of Present Illness 61 y/o man presents to [facility] hospital 07/09/2021 with multiple recent admissions of progressive discititis and osteomyelitis, now with worsening pain. CT revealed progressive osteomyelitis/discititis at T8-9 compared to recent studies with bony erosion and possible abscesses. Pt underwent T5-11 fusion with T8-9 corpectomy on 5/18. PMH includes HTN, obesity.    PT Comments    Pt expresses apprehension about discharge and going to SNF, however reports that he knows that he has to work harder to be able to go home. Pt continues to require encouragement but is able to perform bed mobility with min guard, and transfers and ambulation with min A. Pt to discharge to [facility] this afternoon.     Recommendations for follow up therapy are one component of a multi-disciplinary discharge planning process, led by the attending physician.  Recommendations may be updated based on patient status, additional functional criteria and insurance authorization.  Follow Up Recommendations  Skilled nursing-short term rehab (<3 hours/day)     Assistance Recommended at Discharge Frequent or constant Supervision/Assistance  Patient can return home with the following Assist for transportation;Help with stairs or ramp for entrance;Assistance with cooking/housework;A lot of help with walking and/or transfers;A lot of help with bathing/dressing/bathroom   Equipment Recommendations  Wheelchair (measurements PT);Wheelchair cushion (measurements PT);Hospital bed    Recommendations for Other Services       Precautions / Restrictions Precautions Precautions: Fall;Back Precaution Booklet Issued: No Precaution Comments: poor carryover of back protocols despite cuing Required Braces or Orthoses: Spinal Brace Spinal Brace: Thoracolumbosacral orthotic;Applied in sitting  position Restrictions Weight Bearing Restrictions: No     Mobility  Bed Mobility Overal bed mobility: Needs Assistance Bed Mobility: Sidelying to Sit Rolling: Min guard   Supine to sit: Min guard, HOB elevated Sit to supine: Min guard, HOB elevated   General bed mobility comments: pt does not follow back precautions however is able to get in and out of bed with only min guard    Transfers Overall transfer level: Needs assistance Equipment used: Rolling walker (2 wheels) Transfers: Sit to/from Stand, Bed to chair/wheelchair/BSC Sit to Stand: Min assist           General transfer comment: minA for power up and steadying in standing, pt does not actually need the assist however "I feel better if you are at least holding him"    Ambulation/Gait Ambulation/Gait assistance: Min assist Gait Distance (Feet): 120 Feet Assistive device: Rolling walker (2 wheels) Gait Pattern/deviations: Step-through pattern, Decreased step length - right, Decreased step length - left Gait velocity: decr Gait velocity interpretation: <1.31 ft/sec, indicative of household ambulator   General Gait Details: light min A for progressing gait, decreased negative talk and resigned to not have to sit in recliner and have to get back up again         Balance Overall balance assessment: Needs assistance Sitting-balance support: Single extremity supported, Feet supported Sitting balance-Leahy Scale: Fair Sitting balance - Comments: UE support for pain   Standing balance support: Bilateral upper extremity supported, During functional activity Standing balance-Leahy Scale: Poor Standing balance comment: reliant on UE support when standing                            Cognition Arousal/Alertness: Awake/alert Behavior During Therapy: Anxious Overall Cognitive Status: History of cognitive impairments - at baseline  General Comments: increased  time to perform tasks due to procrastination and negative talk of abilities           General Comments General comments (skin integrity, edema, etc.): VSS on RA      Pertinent Vitals/Pain Pain Assessment Pain Assessment: Faces Faces Pain Scale: Hurts a little bit Pain Location: back, b LE Pain Descriptors / Indicators: Aching Pain Intervention(s): Limited activity within patient's tolerance, Monitored during session, Repositioned     PT Goals (current goals can now be found in the care plan section) Acute Rehab PT Goals PT Goal Formulation: With patient/family Time For Goal Achievement: 08/01/21 Potential to Achieve Goals: Fair Progress towards PT goals: Progressing toward goals    Frequency    Min 3X/week      PT Plan Current plan remains appropriate       AM-PAC PT "6 Clicks" Mobility   Outcome Measure  Help needed turning from your back to your side while in a flat bed without using bedrails?: A Little Help needed moving from lying on your back to sitting on the side of a flat bed without using bedrails?: A Little Help needed moving to and from a bed to a chair (including a wheelchair)?: A Little Help needed standing up from a chair using your arms (e.g., wheelchair or bedside chair)?: A Little Help needed to walk in hospital room?: A Little Help needed climbing 3-5 steps with a railing? : Total 6 Click Score: 16    End of Session Equipment Utilized During Treatment: Gait belt;Back brace Activity Tolerance: Other (comment) (self limiting) Patient left: in bed;with call bell/phone within reach;with bed alarm set Nurse Communication: Mobility status PT Visit Diagnosis: Pain;Difficulty in walking, not elsewhere classified (R26.2) Pain - Right/Left: Right Pain - part of body:  (back)     Time: 2836-6294 PT Time Calculation (min) (ACUTE ONLY): 30 min  Charges:  $Gait Training: 8-22 mins $Therapeutic Activity: 8-22 mins                     Vandell Kun B.  Beverely Risen PT, DPT Acute Rehabilitation Services Please use secure chat or  Call Office [phone]    Elon Alas [facility] 07/31/2021, 3:54 PM

## 2021-07-31 NOTE — Discharge Summary (Addendum)
Physician Discharge Summary   Patient: Michael [name] MRN: [id number] DOB: May 13, 1960  Admit date:     07/09/2021  Discharge date: 07/31/21  Discharge Physician: Hosie Poisson   PCP: Lavella Lemons, PA   Recommendations at discharge:  Please follow up with Neurosurgery as scheduled.  Please follow up with PCP in one week.  Please check CBC and BMP in one week.   Discharge Diagnoses: Principal Problem:   Osteomyelitis of thoracic region [facility]) Active Problems:   Thoracic discitis   Poor dentition   Nausea and vomiting   Essential hypertension   Hospital Course:  Patient is a 61 year old male with history of T8-9 discitis/osteomyelitis, E. coli bacteremia presented to [facility] ER with complaint of pain.  CT imaging again showed T8-T9 osteomyelitis, discitis, now with evidence of bony erosion, progression since prior.  He was transferred to [facility] for [facility]surgery evaluation, admitted under [facility] service.  Patient was started on broad-spectrum antibiotics, ID consulted.  Patient underwent T5-T11 fusion, T8-T9 corpectomy by neurosurgery and was transferred to ICU for postop stay.  Hospital course remarkable for poor mobility.  PT/OT recommending skilled nursing facility on discharge.  Medically stable for discharge  Assessment and Plan:  Thoracic vertebral osteomyelitis CT imaging shows T8-T9 osteomyelitis with discitis with evidence of bony erosion. Patient underwent T5-T11 fusion, T8-T9 corpectomy by neurosurgery on 07/17/2021.  Neurosurgery recommends TLSO brace when out of bed.  Blood cultures have been negative so far.  Infectious disease on board recommends Rocephin till 09/04/2021.  PICC line placed. Pain controlled supportive care.  Therapy evaluations recommending SNF     Acute blood loss anemia s/p 1 unit of PRBC transfusion Transfuse to keep hemoglobin greater than 7. Hemoglobin stable around 9     Oral candidiasis Treated with nystatin.   AKI secondary to poor  oral intake and dehydration Resolved.     Depressed mood. Evaluated by psychiatry on 07/14/2021.  Patient denies any suicidal ideations or homicidal ideations. Reports feeling overwhelmed due to ongoing pain, continue with Supportive therapy.  Pain control.     Debility and deconditioning Therapy evaluations recommending SNF.  Currently waiting for placement.     Essential hypertension Blood pressure parameters appear to be optimal       Consultants: neuro surgery.  Procedures performed: T5 T11 fusion and T8 T9 corpectomy.   Disposition: Skilled nursing facility Diet recommendation:  Discharge Diet Orders (From admission, onward)     Start     Ordered   07/31/21 0000  Diet - low sodium heart healthy        07/31/21 1307   07/29/21 0000  Diet - low sodium heart healthy        07/29/21 1149           Regular diet DISCHARGE MEDICATION: Allergies as of 07/31/2021   No Known Allergies      Medication List     STOP taking these medications    cefadroxil 500 MG capsule Commonly known as: DURICEF   traMADol 50 MG tablet Commonly known as: Ultram       TAKE these medications    acetaminophen 500 MG tablet Commonly known as: TYLENOL Take 2 tablets (1,000 mg total) by mouth every 6 (six) hours. What changed:  medication strength how much to take when to take this reasons to take this   bisacodyl 10 MG suppository Commonly known as: Dulcolax Place 1 suppository (10 mg total) rectally as needed for moderate constipation.   cefTRIAXone  IVPB Commonly known as: ROCEPHIN Inject 2 g into the vein daily. Indication:  T8-9 Discitis  First Dose: Yes Last Day of Therapy:  09/04/21 Labs - Once weekly:  CBC/D and BMP, Labs - Every other week:  ESR and CRP   docusate sodium 100 MG capsule Commonly known as: Colace Take 1 capsule (100 mg total) by mouth 2 (two) times daily.   feeding supplement Liqd Take 237 mLs by mouth 3 (three) times daily between meals. What  changed: when to take this   gabapentin 300 MG capsule Commonly known as: NEURONTIN Take 1 capsule (300 mg total) by mouth 3 (three) times daily.   lisinopril-hydrochlorothiazide 10-12.5 MG tablet Commonly known as: ZESTORETIC Take 0.5 tablets by mouth daily.   methocarbamol 500 MG tablet Commonly known as: ROBAXIN Take 1 tablet (500 mg total) by mouth every 6 (six) hours as needed for muscle spasms.   multivitamin with minerals Tabs tablet Take 1 tablet by mouth daily.   oxyCODONE 5 MG immediate release tablet Commonly known as: Oxy IR/ROXICODONE Take 1 tablet (5 mg total) by mouth every 3 (three) hours as needed for moderate pain ((score 4 to 6)).   pantoprazole 40 MG tablet Commonly known as: Protonix Take 1 tablet (40 mg total) by mouth daily for 14 days.   polyethylene glycol 17 g packet Commonly known as: MIRALAX / GLYCOLAX Take 17 g by mouth daily.               Discharge Care Instructions  (From admission, onward)           Start     Ordered   07/31/21 0000  Discharge wound care:       Comments: CLeanse coccyx and buttocks wounds with NS and pat dry. Apply Aquaphor to buttocks and coccyx twice daily and PRN soilage. Leave open to air and in contact with dermatherapy linen.   07/31/21 1307   07/29/21 0000  Discharge wound care:       Comments: Staples in place - to be removed at follow up with neurosurgery. Keep area clean and dry.   07/29/21 1149            Follow-up Information     Vu, Rockey Situ, MD Follow up on 09/03/2021.   Specialty: Infectious Diseases Why: Appointment at 2:30pm Contact information: [address] [place] [address] [phone]                Discharge Exam: Danley Danker Weights   07/09/21 2200  Weight: 125.9 kg   General exam: Appears calm and comfortable  Respiratory system: Clear to auscultation. Respiratory effort normal. Cardiovascular system: S1 & S2 heard, RRR. No JVD, murmurs, rubs, gallops  or clicks. No pedal edema. Gastrointestinal system: Abdomen is nondistended, soft and nontender. No organomegaly or masses felt. Normal bowel sounds heard. Central nervous system: Alert and oriented. No focal neurological deficits. Extremities: Symmetric 5 x 5 power. Skin: No rashes, lesions or ulcers Psychiatry: Judgement and insight appear normal. Mood & affect appropriate.    Condition at discharge: fair  The results of significant diagnostics from this hospitalization (including imaging, microbiology, ancillary and laboratory) are listed below for reference.   Imaging Studies: DG Thoracic Spine 2 View  Result Date: 07/17/2021 CLINICAL DATA:  T8 and T9 corpectomy, T5 through T11 fusion EXAM: THORACIC SPINE 2 VIEWS COMPARISON:  Thoracic spine MRI 07/10/2021 FINDINGS: Two C-arm fluoroscopic images were obtained intraoperatively and submitted for post operative interpretation. Surgical levels  can not be adequately assessed on the current study due to limited field of view. Postsurgical changes reflecting bilateral pedicle screws and 2 level corpectomy are seen on the initial image. Subsequent images demonstrate placement of interlocking rods. Fluoro time 31 seconds, dose 11.09 mGy. Please see the performing provider's procedural report for further detail. IMPRESSION: Intraoperative images during thoracic spine corpectomy and posterior instrumented fusion. Electronically Signed   By: Valetta Mole M.D.   On: 07/17/2021 15:07   MR THORACIC SPINE W WO CONTRAST  Result Date: 07/10/2021 CLINICAL DATA:  Thoracic osteomyelitis. EXAM: MRI THORACIC WITHOUT AND WITH CONTRAST TECHNIQUE: Multiplanar and multiecho pulse sequences of the thoracic spine were obtained without and with intravenous contrast. CONTRAST:  28mL GADAVIST GADOBUTROL 1 MMOL/ML IV SOLN COMPARISON:  CT thoracic spine 07/08/2021, MRI thoracic spine 06/12/2021 FINDINGS: Alignment:  Normal Vertebrae: Discitis osteomyelitis at T8-9. Progressive  bony erosion involving the inferior endplate of T8 and superior endplate of T9 compared with the prior MRI. Endplate erosion similar to the prior CT. Nonenhancing fluid is seen in the disc space extending into the paraspinous tissues compatible with infected fluid. No new areas of osteomyelitis in the thoracic spine Cord: Progressive spinal stenosis the T8-9 with moderate stenosis and cord flattening. Progressive retropulsion of bone and soft tissue into the ventral epidural space at T8-9. Some of this could be infected fluid. Majority of this does enhance homogeneously. Bone fragment noted in this area on CT. Mild cord hyperintensity is present at this level. This has progressed in the interval. Paraspinal and other soft tissues: Paraspinous soft tissue thickening diffusely around the T8-9 disc space. Multiple small abscesses are present, similar to the prior study. Small pleural effusions bilaterally. Disc levels: Discitis osteomyelitis at T8-9. Progressive ventral epidural enhancing tissue with progressive spinal stenosis. Mild cord hyperintensity is new. Posterior epidural soft tissue thickening is similar. No other levels of spinal stenosis in the thoracic spine. Small right-sided disc protrusion at T5-6 unchanged. IMPRESSION: Discitis and osteomyelitis at T8-9. Progressive bony erosion since the prior MRI of 06/12/2021 but no change from the CT of 07/08/2021. Progressive spinal stenosis with moderate spinal stenosis, cord compression, and cord hyperintensity which has developed in the interval. Increased fluid in the disc space compatible with infection. Paraspinous soft tissue thickening and multiple small paraspinous soft tissue abscesses similar to the prior study Electronically Signed   By: Franchot Gallo M.D.   On: 07/10/2021 18:02   US RENAL  Result Date: 07/11/2021 CLINICAL DATA:  Acute kidney injury. EXAM: RENAL / URINARY TRACT ULTRASOUND COMPLETE COMPARISON:  None Available. FINDINGS: Right  Kidney: Renal measurements: 10.7 cm x 4.9 cm x 5.9 cm = volume: 159.2 mL. Echogenicity within normal limits. No mass or hydronephrosis visualized. Left Kidney: Renal measurements: 11.6 cm x 5.7 cm x 5.7 cm = volume: 198.4 mL. Mild, diffusely increased echogenicity of the renal parenchyma is noted. No mass or hydronephrosis visualized. Bladder: Poorly distended and subsequently limited in evaluation. Other: The study is limited secondary to the patient's body habitus and difficulty with repositioning/breath hold during the exam. IMPRESSION: Limited study, as described above, with mildly increased echogenicity of the left kidney. This may be, in part, technical in origin. Sequelae associated with medical renal disease cannot be excluded. Electronically Signed   By: Virgina Norfolk M.D.   On: 07/11/2021 21:58   DG C-Arm 1-60 Min-No Report  Result Date: 07/17/2021 Fluoroscopy was utilized by the requesting physician.  No radiographic interpretation.   DG C-Arm 1-60 Min-No Report  Result Date: 07/17/2021 Fluoroscopy was utilized by the requesting physician.  No radiographic interpretation.   DG C-Arm 1-60 Min-No Report  Result Date: 07/17/2021 Fluoroscopy was utilized by the requesting physician.  No radiographic interpretation.   DG C-Arm 1-60 Min-No Report  Result Date: 07/17/2021 Fluoroscopy was utilized by the requesting physician.  No radiographic interpretation.   DG C-Arm 1-60 Min-No Report  Result Date: 07/17/2021 Fluoroscopy was utilized by the requesting physician.  No radiographic interpretation.   DG C-Arm 1-60 Min-No Report  Result Date: 07/17/2021 Fluoroscopy was utilized by the requesting physician.  No radiographic interpretation.   DG C-Arm 1-60 Min-No Report  Result Date: 07/17/2021 Fluoroscopy was utilized by the requesting physician.  No radiographic interpretation.   [place] EKG SITE RITE  Result Date: 07/22/2021 If [facility] image not attached, placement could not be  confirmed due to current cardiac rhythm.   Microbiology: Results for orders placed or performed during the hospital encounter of 07/09/21  MRSA Next Gen by PCR, Nasal     Status: None   Collection Time: 07/10/21  6:45 AM   Specimen: Nasal Mucosa; Nasal Swab  Result Value Ref Range Status   MRSA by PCR Next Gen NOT DETECTED NOT DETECTED Final    Comment: (NOTE) The GeneXpert MRSA Assay (FDA approved for NASAL specimens only), is one component of a comprehensive MRSA colonization surveillance program. It is not intended to diagnose MRSA infection nor to guide or monitor treatment for MRSA infections. Test performance is not FDA approved in patients less than 51 years old. Performed at [facility], [place] [address], [place], [place] [address]   Culture, blood (Routine X 2) w Reflex to ID Panel     Status: None   Collection Time: 07/10/21  2:59 PM   Specimen: BLOOD  Result Value Ref Range Status   Specimen Description BLOOD LEFT ANTECUBITAL  Final   Special Requests   Final    BOTTLES DRAWN AEROBIC AND ANAEROBIC Blood Culture adequate volume   Culture   Final    NO GROWTH 5 DAYS Performed at [facility], [place] [address], [place], [place] [address]    Report Status 07/15/2021 FINAL  Final  Culture, blood (Routine X 2) w Reflex to ID Panel     Status: None   Collection Time: 07/10/21  3:00 PM   Specimen: BLOOD  Result Value Ref Range Status   Specimen Description BLOOD RIGHT ANTECUBITAL  Final   Special Requests   Final    BOTTLES DRAWN AEROBIC AND ANAEROBIC Blood Culture adequate volume   Culture   Final    NO GROWTH 5 DAYS Performed at [facility], [place] [address] [place], [place] [address]    Report Status 07/15/2021 FINAL  Final  Aerobic/Anaerobic Culture w Gram Stain (surgical/deep wound)     Status: None   Collection Time: 07/17/21 10:28 AM   Specimen: Soft Tissue, Other  Result Value Ref Range Status   Specimen Description TISSUE  Final   Special  Requests THRACIC EIGHT NINE JOINT FOR CULTURE  Final   Gram Stain NO WBC SEEN NO ORGANISMS SEEN   Final   Culture   Final    No growth aerobically or anaerobically. Performed at [facility], [place] [address] [place], [place] [address]    Report Status 07/22/2021 FINAL  Final  Aerobic/Anaerobic Culture w Gram Stain (surgical/deep wound)     Status: None   Collection Time: 07/17/21 12:27 PM  Specimen: Soft Tissue, Other  Result Value Ref Range Status   Specimen Description WOUND  Final   Special Requests THORAIC EIGHT NINE DISC SPACE WOUND CULTURE SPEC B  Final   Gram Stain NO WBC SEEN NO ORGANISMS SEEN   Final   Culture   Final    No growth aerobically or anaerobically. Performed at [facility], [place] [address], [place], [place] [address]    Report Status 07/22/2021 FINAL  Final  MRSA Next Gen by PCR, Nasal     Status: None   Collection Time: 07/17/21  6:28 PM   Specimen: Nasal Mucosa; Nasal Swab  Result Value Ref Range Status   MRSA by PCR Next Gen NOT DETECTED NOT DETECTED Final    Comment: (NOTE) The GeneXpert MRSA Assay (FDA approved for NASAL specimens only), is one component of a comprehensive MRSA colonization surveillance program. It is not intended to diagnose MRSA infection nor to guide or monitor treatment for MRSA infections. Test performance is not FDA approved in patients less than 75 years old. Performed at [facility], [place] [address], [place], [place] [address]     Labs: CBC: Recent Labs  Lab 07/25/21 0323 07/26/21 0350 07/29/21 0336  WBC 10.4 10.1 9.5  NEUTROABS  --   --  6.0  HGB 8.8* 9.3* 9.3*  HCT 27.6* 28.6* 30.2*  MCV 96.5 96.3 99.3  PLT 297 324 701   Basic Metabolic Panel: Recent Labs  Lab 07/25/21 0323 07/26/21 0350 07/29/21 0336  NA 137 140 140  K 3.5 3.6 3.8  CL 105 107 107  CO2 $Re'26 26 27  'CWQ$ GLUCOSE 101* 90 107*  BUN $Re'7 6 9  'kkd$ CREATININE 0.85 0.94 0.84  CALCIUM 8.4* 8.7* 8.9  MG 2.3 2.3  --    Liver Function  Tests: No results for input(s): AST, ALT, ALKPHOS, BILITOT, PROT, ALBUMIN in the last 168 hours. CBG: Recent Labs  Lab 07/30/21 1949 07/30/21 2323 07/31/21 0309 07/31/21 0840 07/31/21 1118  GLUCAP 149* 90 104* 137* 152*    Discharge time spent: 36 minutes.   Signed: Hosie Poisson, MD [facility] 07/31/2021

## 2021-07-31 NOTE — Progress Notes (Signed)
Nutrition Follow-up  DOCUMENTATION CODES:   Not applicable  INTERVENTION:   - Continue Ensure Enlive po TID, each supplement provides 350 kcal and 20 grams of protein  - Continue MVI with minerals daily  - Encourage PO intake  NUTRITION DIAGNOSIS:   Increased nutrient needs related to post-op healing as evidenced by estimated needs.  Ongoing, being addressed via oral nutrition supplements  GOAL:   Patient will meet greater than or equal to 90% of their needs  Progressing  MONITOR:   Weight trends, Labs, Supplement acceptance, PO intake  REASON FOR ASSESSMENT:   Malnutrition Screening Tool    ASSESSMENT:   61 y.o. male presented to the ED with increased pain. Pt admitted 3/17-3/22/23 and 4/12-07/02/21 for ongoing discitis and osteomyelitis. PMH includes HTN. Pt admitted with osteomyelitis and intermittent nausea and vomiting.  05/18 - s/p T5-11 fusion and T8-9 corpectomy 05/19 - clear liquids 05/21 - dysphagia 3 diet  Per notes, pt is medically stable for discharge to SNF. Pending placement. Per notes and per [facility], pt refusing most medications including MVI.  Spoke with pt at bedside. Pt tearful and not in good spirits at time of visit. Pt requesting assistance with adjusting pillows and bed so that he can rest. RD offered to bring an Ensure at time of visit and pt agreeable to this so a vanilla Ensure was provided. Discussed with pt the importance of adequate kcal and protein intake in healing, maintaining lean muscle mass, and making progress with therapies. Pt states that he plans to eat spaghetti today for lunch.  Meal Completion: 0-100%  Medications reviewed and include: Ensure Enlive TID, SSI, MVI with minerals, protonix, miralax, IV abx  Labs reviewed. CBG's: 90-149 x 24 hours  UOP: 700 ml x 24 hours I/O's: -3.0 L since admit  Diet Order:   Diet Order             Diet - low sodium heart healthy           DIET DYS 3 Room service appropriate? Yes; Fluid  consistency: Thin  Diet effective now                   EDUCATION NEEDS:   Education needs have been addressed  Skin:  Skin Assessment: Skin Integrity Issues: Incisions: back Other: skin tear buttocks  Last BM:  07/27/21  Height:   Ht Readings from Last 1 Encounters:  07/09/21 5\' 11"  (1.803 m)    Weight:   Wt Readings from Last 1 Encounters:  07/09/21 125.9 kg    Ideal Body Weight:  78.2 kg  BMI:  Body mass index is 38.71 kg/m.  Estimated Nutritional Needs:   Kcal:  2300-2500  Protein:  115-130 grams  Fluid:  >/= 2 L    09/08/21, MS, RD, LDN Inpatient Clinical Dietitian Please see AMiON for contact information.

## 2021-07-31 NOTE — Progress Notes (Signed)
Report called to nurse at [facility] ([facility]) all questions answered. Discharge packet placed in envelop for [facility]. Pt and all belongings with [facility] for transport.

## 2021-07-31 NOTE — TOC Progression Note (Signed)
Transition of Care [facility]) - Progression Note    Patient Details  Name: Michael [name] MRN: [id number] Date of Birth: [birth year]  Transition of Care [facility]) CM/SW [place],  Phone Number: 07/31/2021, 10:42 AM  Clinical Narrative:     Insurance remains pending- SNF will informed CSW once status of authorization has been determined.     Expected Discharge Plan: [place] Barriers to Discharge: Insurance Authorization  Expected Discharge Plan and Services Expected Discharge Plan: [place] arrangements for the past 2 months: Single Family Home Expected Discharge Date: 07/29/21                                     Social Determinants of Health (SDOH) Interventions    Readmission Risk Interventions     View : No data to display.

## 2021-09-03 ENCOUNTER — Encounter: Payer: Self-pay | Admitting: Internal Medicine

## 2021-09-03 ENCOUNTER — Other Ambulatory Visit: Payer: Self-pay

## 2021-09-03 ENCOUNTER — Ambulatory Visit (INDEPENDENT_AMBULATORY_CARE_PROVIDER_SITE_OTHER): Payer: BC Managed Care – PPO | Admitting: Internal Medicine

## 2021-09-03 VITALS — BP 123/83 | HR 111 | Temp 98.4°F

## 2021-09-03 DIAGNOSIS — R7881 Bacteremia: Secondary | ICD-10-CM

## 2021-09-03 DIAGNOSIS — T847XXD Infection and inflammatory reaction due to other internal orthopedic prosthetic devices, implants and grafts, subsequent encounter: Secondary | ICD-10-CM | POA: Diagnosis not present

## 2021-09-03 DIAGNOSIS — B9620 Unspecified Escherichia coli [E. coli] as the cause of diseases classified elsewhere: Secondary | ICD-10-CM | POA: Diagnosis not present

## 2021-09-03 DIAGNOSIS — M4624 Osteomyelitis of vertebra, thoracic region: Secondary | ICD-10-CM

## 2021-09-03 DIAGNOSIS — M4620 Osteomyelitis of vertebra, site unspecified: Secondary | ICD-10-CM

## 2021-09-03 NOTE — Progress Notes (Signed)
[place] for Infectious Disease  Patient Active Problem List   Diagnosis Date Noted   Osteomyelitis of thoracic region ([place]) 07/09/2021   Nausea and vomiting    Constipation    Pressure injury of skin 06/23/2021   Thoracic discitis 06/12/2021   Class 1 obesity due to excess calories with body mass index (BMI) of 34.0 to 34.9 in adult 06/12/2021   Oral thrush 06/12/2021   Poor dentition 06/12/2021   Gram-negative bacteremia 05/17/2021   Essential hypertension 05/16/2021   Obesity, Class III, BMI 40-49.9 (morbid obesity) ([place]) 05/16/2021      Subjective:    Patient ID: Michael [name], male    DOB: 1961-02-05, 61 y.o.   MRN: [id number]  Chief Complaint  Patient presents with   Follow-up    HPI:  Michael [name] is a 61 y.o. male here for hospital f/u ecoli bacteremia/t8-9 osteo  He is here earlier than advised due to severe nausea/vomiting Hasn't been able to keep anything down, feels tired Zofran not helping Reports while in hospital iv phenergan help  Didn't want to try suppository phenergan  Back pain moderate-severe not helped much with hydrocodone No f/c Picc site no issue  ----------- 06/04/21 id f/u Due to severe nausea we had agreed to switch to ceftriaxone on 4/03 He still have severe n/v. His last bun/cr on 3/30 was 18/0.93 No f/c He feels weak/tired No chest pain/sob Intermittent cough No arms pain/numbness  He was seen in ed 3/30 for spurious lab of k in the 6's but repeat testing there was 3.2. he was given 1 liter normal saline and iv zofran and discharged  No headache Passing flatus and bowel movement -- not diarrhea    He is currently only taking phenergan   09/03/21 id clinic f/u Patient was recently admitted to [facility] 5/10-6/01. Reviewed chart He was admitted for increased lower back pain with ct imaging showing progressive erosion of 58-9. He subsequently underwent t8-9 corpectomy and t5-11 fusion. Bcx negative; no  sepsis. 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 Of note his crp was rising just prior to this admission His cefadroxil was stopped during that admission and he was restarted on ceftriaxone with end date 09/04/21 He was discharged to [facility] snf. His picc came out 3 cm on September 15, 2022 and was removed. He last received ceftriaxone on 2022-09-15 His father passed away father's day No fever, chill No n/v/diarrhea   He is wearing tlso brace when out of bed  Pain is tolerable -- he is off narcotics...  No new weakness/numbness/tingling in his legs No urinary incontinence   Walking about 500 ft. Standup on his own Eating well   No Known Allergies    Outpatient Medications Prior to Visit  Medication Sig Dispense Refill   acetaminophen (TYLENOL) 500 MG tablet Take 2 tablets (1,000 mg total) by mouth every 6 (six) hours. 30 tablet 0   bisacodyl (DULCOLAX) 10 MG suppository Place 1 suppository (10 mg total) rectally as needed for moderate constipation. 12 suppository 0   cefTRIAXone (ROCEPHIN) IVPB Inject 2 g into the vein daily. Indication:  T8-9 Discitis  First Dose: Yes Last Day of Therapy:  09/04/21 Labs - Once weekly:  CBC/D and BMP, Labs - Every other week:  ESR and CRP 43 Units 0   docusate sodium (COLACE) 100 MG capsule Take 1 capsule (100 mg total) by mouth 2 (two) times daily.  60 capsule 2   feeding supplement (ENSURE ENLIVE / ENSURE PLUS) LIQD Take 237 mLs by mouth 3 (three) times daily between meals. 237 mL 12   gabapentin (NEURONTIN) 300 MG capsule Take 1 capsule (300 mg total) by mouth 3 (three) times daily. 60 capsule 0   lisinopril-hydrochlorothiazide (ZESTORETIC) 10-12.5 MG tablet Take 0.5 tablets by mouth daily.     methocarbamol (ROBAXIN) 500 MG tablet Take 1 tablet (500 mg total) by mouth every 6 (six) hours as needed for muscle spasms. 30 tablet 1   Multiple Vitamin (MULTIVITAMIN WITH MINERALS) TABS tablet Take 1 tablet by  mouth daily.     pantoprazole (PROTONIX) 40 MG tablet Take 1 tablet (40 mg total) by mouth daily for 14 days. 14 tablet 0   polyethylene glycol (MIRALAX / GLYCOLAX) 17 g packet Take 17 g by mouth daily. 14 each 0   No facility-administered medications prior to visit.     Social History   Socioeconomic History   Marital status: Married    Spouse name: Not on file   Number of children: Not on file   Years of education: Not on file   Highest education level: Not on file  Occupational History   Occupation: truck driver  Tobacco Use   Smoking status: Never   Smokeless tobacco: Never  Vaping Use   Vaping Use: Never used  Substance and Sexual Activity   Alcohol use: Not Currently   Drug use: Never   Sexual activity: Not Currently  Other Topics Concern   Not on file  Social History Narrative   Not on file   Social Determinants of Health   Financial Resource Strain: Not on file  Food Insecurity: Not on file  Transportation Needs: Not on file  Physical Activity: Not on file  Stress: Not on file  Social Connections: Not on file  Intimate Partner Violence: Not on file      Review of Systems    All other ros negative  Objective:    There were no vitals taken for this visit. Nursing note and vital signs reviewed.  Physical Exam     General/constitutional: no distress, pleasant; in wheel chair; in tlso brace HEENT: Normocephalic, PER, Conj Clear, EOMI, Oropharynx clear Neck supple CV: rrr no mrg Lungs: clear to auscultation, normal respiratory effort Abd: Soft, Nontender Ext: no edema Skin: No Rash Neuro: strength symmetric 4-5/5 MSK: in brace; wound incision healed  Labs: Lab Results  Component Value Date   WBC 9.5 07/29/2021   HGB 9.3 (L) 07/29/2021   HCT 30.2 (L) 07/29/2021   MCV 99.3 07/29/2021   PLT 320 07/29/2021   Last metabolic panel Lab Results  Component Value Date   GLUCOSE 107 (H) 07/29/2021   NA 140 07/29/2021   K 3.8 07/29/2021   CL  107 07/29/2021   CO2 27 07/29/2021   BUN 9 07/29/2021   CREATININE 0.84 07/29/2021   GFRNONAA >60 07/29/2021   CALCIUM 8.9 07/29/2021   PHOS 2.9 06/25/2021   PROT 5.3 (L) 07/21/2021   ALBUMIN 2.7 (L) 07/21/2021   BILITOT 0.8 07/21/2021   ALKPHOS 65 07/21/2021   AST 19 07/21/2021   ALT 18 07/21/2021   ANIONGAP 6 07/29/2021   Crp: 3/26    82  Micro:  Serology:  Imaging:  Assessment & Plan:   Problem List Items Addressed This Visit   None Visit Diagnoses     Vertebral osteomyelitis (HCC)    -  Primary   Relevant Orders  C-reactive protein   CBC   COMPLETE METABOLIC PANEL WITH GFR   E coli bacteremia       Relevant Orders   C-reactive protein   CBC   COMPLETE METABOLIC PANEL WITH GFR   Hardware complicating wound infection, subsequent encounter       Relevant Orders   C-reactive protein   CBC   COMPLETE METABOLIC PANEL WITH GFR       No orders of the defined types were placed in this encounter.  61 yo male initially (05/16/21) treated for ecoli bacteremia and t8-9 verteral osteomyelitis with cefazolin --> ceftriaxone (due to severe n/v) on 4/01 then maintained on cefadroxil, but increased pain/progressive ct erosion of t8-9 readmitted 5/10-6/1 underwent t8-9 corpectomy with t5-11 spinal fusion on 5/18  He was restarted on ceftriaxone which will finish roughly another 8 weeks   Given hardware presence will continue cefadroxil for now and then potentially amoxicillin for a more narrowed spectrum abx use. Plan 3 months abx and monitor off as his hardware was placed at end of infection treatment   -cefadroxil 1 gram twice a day -labs -see your NSG dr Christella Noa in follow up as well -see me in 4-6 weeks   Follow-up: Return in about 5 weeks (around 10/08/2021).    Jabier Mutton, [place] [facility] [place] [phone]  pager   [phone] cell 09/03/2021, 1:40 PM

## 2021-09-03 NOTE — Patient Instructions (Signed)
Because of metals in your back placed during infection lets try 3 more months of antibiotics pill  At 3 months will start monitoring you off of antibiotics and if your inflammation number stays normal for another 2-3 months we can keep you off abx   Please see your neurosurgeon   See me again in 4-6 weeks

## 2021-09-04 LAB — CBC
HCT: 40.7 % (ref 38.5–50.0)
Hemoglobin: 13 g/dL — ABNORMAL LOW (ref 13.2–17.1)
MCH: 29.7 pg (ref 27.0–33.0)
MCHC: 31.9 g/dL — ABNORMAL LOW (ref 32.0–36.0)
MCV: 92.9 fL (ref 80.0–100.0)
MPV: 12.7 fL — ABNORMAL HIGH (ref 7.5–12.5)
Platelets: 261 10*3/uL (ref 140–400)
RBC: 4.38 10*6/uL (ref 4.20–5.80)
RDW: 13.1 % (ref 11.0–15.0)
WBC: 9.6 10*3/uL (ref 3.8–10.8)

## 2021-09-04 LAB — COMPLETE METABOLIC PANEL WITH GFR
AG Ratio: 1.5 (calc) (ref 1.0–2.5)
ALT: 18 U/L (ref 9–46)
AST: 15 U/L (ref 10–35)
Albumin: 3.8 g/dL (ref 3.6–5.1)
Alkaline phosphatase (APISO): 78 U/L (ref 35–144)
BUN: 11 mg/dL (ref 7–25)
CO2: 25 mmol/L (ref 20–32)
Calcium: 9.4 mg/dL (ref 8.6–10.3)
Chloride: 105 mmol/L (ref 98–110)
Creat: 0.8 mg/dL (ref 0.70–1.30)
Globulin: 2.6 g/dL (calc) (ref 1.9–3.7)
Glucose, Bld: 110 mg/dL — ABNORMAL HIGH (ref 65–99)
Potassium: 3.9 mmol/L (ref 3.5–5.3)
Sodium: 140 mmol/L (ref 135–146)
Total Bilirubin: 0.4 mg/dL (ref 0.2–1.2)
Total Protein: 6.4 g/dL (ref 6.1–8.1)
eGFR: 102 mL/min/{1.73_m2} (ref >=60)

## 2021-09-04 LAB — C-REACTIVE PROTEIN: CRP: 87.7 mg/L — ABNORMAL HIGH (ref <8.0)

## 2021-09-09 ENCOUNTER — Telehealth: Payer: Self-pay

## 2021-09-09 DIAGNOSIS — M4620 Osteomyelitis of vertebra, site unspecified: Secondary | ICD-10-CM

## 2021-09-09 MED ORDER — CEFADROXIL 500 MG PO CAPS: 1000.0000 mg | ORAL_CAPSULE | Freq: Two times a day (BID) | ORAL | 1 refills | Status: DC

## 2021-09-09 NOTE — Addendum Note (Signed)
Addended by: Linna Hoff D on: 09/09/2021 03:28 PM   Modules accepted: Orders

## 2021-09-09 NOTE — Telephone Encounter (Signed)
Called patient to notify him that refill was sent, no answer. Left HIPAA compliant voicemail requesting callback.   Sandie Ano, RN

## 2021-09-09 NOTE — Telephone Encounter (Signed)
-----   Message from Lucrezia Europe sent at 09/09/2021 10:53 AM EDT ----- Pt's spouse called to veriy pt's next appt with Dr. Daiva Eves. She also stated that he is a little uneasy, for he will be out of his antibiotic he has been taking before the next f/u appt. She was unable to confirm exactly the medication name, but would like to check to see if it is alright for him to finish out the supply prior to visit or will he need a refill until seen?

## 2021-09-10 NOTE — Telephone Encounter (Signed)
Spoke with patient's wife, [name]. Notified her that per Dr. Renold Don patient is to continue with cefadroxil for now and that refills were sent.   Sandie Ano, RN

## 2021-10-08 ENCOUNTER — Other Ambulatory Visit: Payer: Self-pay

## 2021-10-08 ENCOUNTER — Ambulatory Visit (INDEPENDENT_AMBULATORY_CARE_PROVIDER_SITE_OTHER): Payer: BC Managed Care – PPO | Admitting: Infectious Disease

## 2021-10-08 ENCOUNTER — Encounter: Payer: Self-pay | Admitting: Infectious Disease

## 2021-10-08 VITALS — BP 127/87 | HR 95 | Temp 98.1°F | Ht 72.0 in | Wt 260.0 lb

## 2021-10-08 DIAGNOSIS — R7881 Bacteremia: Secondary | ICD-10-CM

## 2021-10-08 DIAGNOSIS — M4624 Osteomyelitis of vertebra, thoracic region: Secondary | ICD-10-CM | POA: Diagnosis not present

## 2021-10-08 DIAGNOSIS — T847XXD Infection and inflammatory reaction due to other internal orthopedic prosthetic devices, implants and grafts, subsequent encounter: Secondary | ICD-10-CM

## 2021-10-08 DIAGNOSIS — I10 Essential (primary) hypertension: Secondary | ICD-10-CM | POA: Diagnosis not present

## 2021-10-08 DIAGNOSIS — R1312 Dysphagia, oropharyngeal phase: Secondary | ICD-10-CM

## 2021-10-08 DIAGNOSIS — M4620 Osteomyelitis of vertebra, site unspecified: Secondary | ICD-10-CM

## 2021-10-08 MED ORDER — CEFADROXIL 500 MG PO CAPS: 1000.0000 mg | ORAL_CAPSULE | Freq: Two times a day (BID) | ORAL | 5 refills | Status: DC

## 2021-10-08 NOTE — Progress Notes (Signed)
Complaint he does not like taking pills because he cannot swallow them and is always tasting material from the capsule  Subjective:    Patient ID: Michael [name], male    DOB: Aug 19, 1960, 61 y.o.   MRN: [id number]  HPI  Michael [name] is a 61 year old male with a history of E. coli bacteremia and T8-T9 discitis.  He had a complicated course abdomen treated initially with IV antibiotics but then being readmitted in May through June with worsening low back pain imaging showed progressive erosion of T5 8 through 9.  He underwent subsequent T8-T9 corpectomy and T5-11 fusion.  He then completed ceftriaxone followed by cefadroxil.  He says he hardly has any pain at all in his back.  His mobility is still limited likely due to deconditioning he was getting physical therapy 5 days a week in the skilled facility and in the hospital but now only gets twice a week and only for half hour of the time.  He does have talk trouble taking pills.  As he has difficulty swallowing and cannot swallow pills effectively he has been able to get the antibiotics down though.    Past Medical History:  Diagnosis Date   Class 1 obesity due to excess calories with body mass index (BMI) of 34.0 to 34.9 in adult    Diskitis    Hypertension     Past Surgical History:  Procedure Laterality Date   IR CERVICAL/THORACIC DISC ASPIRATION W/IMAG GUIDE  05/18/2021   IR FLUORO GUIDED NEEDLE PLC ASPIRATION/INJECTION LOC  06/13/2021    No family history on file.    Social History   Socioeconomic History   Marital status: Married    Spouse name: Not on file   Number of children: Not on file   Years of education: Not on file   Highest education level: Not on file  Occupational History   Occupation: truck driver  Tobacco Use   Smoking status: Never   Smokeless tobacco: Never  Vaping Use   Vaping Use: Never used  Substance and Sexual Activity   Alcohol use: Not Currently   Drug use: Never   Sexual activity: Not Currently   Other Topics Concern   Not on file  Social History Narrative   Not on file   Social Determinants of Health   Financial Resource Strain: Not on file  Food Insecurity: Not on file  Transportation Needs: Not on file  Physical Activity: Not on file  Stress: Not on file  Social Connections: Not on file    No Known Allergies   Current Outpatient Medications:    acetaminophen (TYLENOL) 500 MG tablet, Take 2 tablets (1,000 mg total) by mouth every 6 (six) hours., Disp: 30 tablet, Rfl: 0   bisacodyl (DULCOLAX) 10 MG suppository, Place 1 suppository (10 mg total) rectally as needed for moderate constipation., Disp: 12 suppository, Rfl: 0   cefadroxil (DURICEF) 500 MG capsule, Take 2 capsules (1,000 mg total) by mouth 2 (two) times daily., Disp: 120 capsule, Rfl: 1   docusate sodium (COLACE) 100 MG capsule, Take 1 capsule (100 mg total) by mouth 2 (two) times daily., Disp: 60 capsule, Rfl: 2   feeding supplement (ENSURE ENLIVE / ENSURE PLUS) LIQD, Take 237 mLs by mouth 3 (three) times daily between meals., Disp: 237 mL, Rfl: 12   gabapentin (NEURONTIN) 300 MG capsule, Take 1 capsule (300 mg total) by mouth 3 (three) times daily., Disp: 60 capsule, Rfl: 0   lisinopril-hydrochlorothiazide (ZESTORETIC) 10-12.5 MG tablet, Take  0.5 tablets by mouth daily., Disp: , Rfl:    methocarbamol (ROBAXIN) 500 MG tablet, Take 1 tablet (500 mg total) by mouth every 6 (six) hours as needed for muscle spasms., Disp: 30 tablet, Rfl: 1   Multiple Vitamin (MULTIVITAMIN WITH MINERALS) TABS tablet, Take 1 tablet by mouth daily., Disp: , Rfl:    pantoprazole (PROTONIX) 40 MG tablet, Take 1 tablet (40 mg total) by mouth daily for 14 days., Disp: 14 tablet, Rfl: 0   polyethylene glycol (MIRALAX / GLYCOLAX) 17 g packet, Take 17 g by mouth daily., Disp: 14 each, Rfl: 0  Review of Systems  Constitutional:  Negative for activity change, appetite change, chills, diaphoresis, fatigue, fever and unexpected weight change.   HENT:  Negative for congestion, rhinorrhea, sinus pressure, sneezing, sore throat and trouble swallowing.   Eyes:  Negative for photophobia and visual disturbance.  Respiratory:  Negative for cough, chest tightness, shortness of breath, wheezing and stridor.   Cardiovascular:  Negative for chest pain, palpitations and leg swelling.  Gastrointestinal:  Negative for abdominal distention, abdominal pain, anal bleeding, blood in stool, constipation, diarrhea, nausea and vomiting.  Genitourinary:  Negative for difficulty urinating, dysuria, flank pain and hematuria.  Musculoskeletal:  Positive for back pain. Negative for arthralgias, gait problem, joint swelling and myalgias.  Skin:  Negative for color change, pallor, rash and wound.  Neurological:  Positive for weakness. Negative for dizziness, tremors, light-headedness and headaches.  Hematological:  Negative for adenopathy. Does not bruise/bleed easily.  Psychiatric/Behavioral:  Negative for agitation, behavioral problems, confusion, decreased concentration, dysphoric mood, sleep disturbance and suicidal ideas.        Objective:   Physical Exam Constitutional:      Appearance: He is well-developed.  HENT:     Head: Normocephalic and atraumatic.  Eyes:     Conjunctiva/sclera: Conjunctivae normal.  Cardiovascular:     Rate and Rhythm: Normal rate and regular rhythm.  Pulmonary:     Effort: Pulmonary effort is normal. No respiratory distress.     Breath sounds: No wheezing.  Abdominal:     General: There is no distension.     Palpations: Abdomen is soft.  Musculoskeletal:        General: No tenderness. Normal range of motion.     Cervical back: Normal range of motion and neck supple.  Skin:    General: Skin is warm and dry.     Coloration: Skin is not pale.     Findings: No erythema or rash.  Neurological:     Mental Status: He is alert and oriented to person, place, and time.  Psychiatric:        Mood and Affect: Mood normal.         Behavior: Behavior normal.        Thought Content: Thought content normal.        Judgment: Judgment normal.           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  I would like him to continue on cefadroxil that we could consider other options such as amoxicillin since he actually cannot swallow these pills very well.  At the present he wanted to continue with cefadroxil.  I typically treat these patients for at least a year of antibiotics including IV and oral-- but I have had many people have relapses and recurrences with shorter courses.  I would  like him to see Dr. Renold Don in October and re-assess how long antibiotics will be pushed  Pill dysphagia: will ask ID pharmacy for recs, IV there is a better crushable option or liquid option of antibiotic such as amoxicillin?  I spent 36  minutes with the patient including than 50% of the time in face to face counseling of the patient his wife regarding the nature of hardware associated bone infectionsalong with review of medical records in preparation for the visit and during the visit and in coordination of his care.

## 2021-10-09 ENCOUNTER — Telehealth: Payer: Self-pay

## 2021-10-09 ENCOUNTER — Other Ambulatory Visit (HOSPITAL_COMMUNITY): Payer: Self-pay

## 2021-10-09 NOTE — Telephone Encounter (Signed)
Spoke to patient wife Michael [name] - I asked if patient would like to switch abx to amoxicillin and it can be crushed and placed into semi solid foods, she stated patient is taking Cefadroxil fine. I advised her if he has any issues to let us know.    Michael [name] Lesli Albee, CMA

## 2021-10-09 NOTE — Telephone Encounter (Signed)
-----   Message from Randall Hiss, MD sent at 10/09/2021 11:20 AM EDT ----- Ok perfect, Triage can we find out if this gentleman wants to change to the amoxicillin which he can crush and take with small amount of semi solid food? ----- Message ----- From: Jennette Kettle, RPH-CPP Sent: 10/09/2021  10:46 AM EDT To: Randall Hiss, MD  Yes, amoxicillin tablets can be crushed, and the amoxicillin capsules can be opened as well in a small amount of semisolid food. ----- Message ----- From: Daiva Eves, Lisette Grinder, MD Sent: 10/09/2021  10:05 AM EDT To: Jennette Kettle, RPH-CPP  Could he crush the amoxicillin? ----- Message ----- From: Jennette Kettle, RPH-CPP Sent: 10/09/2021   9:43 AM EDT To: Randall Hiss, MD; #  Good morning Dr. Daiva Eves,  So Lupita Leash can't find insurance on him, so I can't verify cost of anything. There is an oral suspension for cefadroxil if needed. Of course something like amoxicillin would work as well would just be more often during the day.  Beatrice Lecher  ----- Message ----- From: Daiva Eves, Lisette Grinder, MD Sent: 10/08/2021   2:31 PM EDT To: Robinette Haines, RPH-CPP; Raymondo Band, MD; #  Extended cefadroxil but he is not able to really swallow pills I am wondering if there is a better option that could be crushed and would be more palatable to him like amoxicillin tablets? Henry Russel?

## 2021-10-10 LAB — CBC WITH DIFFERENTIAL/PLATELET
Absolute Monocytes: 819 cells/uL (ref 200–950)
Basophils Absolute: 53 cells/uL (ref 0–200)
Basophils Relative: 0.6 %
Eosinophils Absolute: 240 cells/uL (ref 15–500)
Eosinophils Relative: 2.7 %
HCT: 49.2 % (ref 38.5–50.0)
Hemoglobin: 15.7 g/dL (ref 13.2–17.1)
Lymphs Abs: 1869 cells/uL (ref 850–3900)
MCH: 29 pg (ref 27.0–33.0)
MCHC: 31.9 g/dL — ABNORMAL LOW (ref 32.0–36.0)
MCV: 90.9 fL (ref 80.0–100.0)
MPV: 12.9 fL — ABNORMAL HIGH (ref 7.5–12.5)
Monocytes Relative: 9.2 %
Neutro Abs: 5919 cells/uL (ref 1500–7800)
Neutrophils Relative %: 66.5 %
Platelets: 258 10*3/uL (ref 140–400)
RBC: 5.41 10*6/uL (ref 4.20–5.80)
RDW: 13.3 % (ref 11.0–15.0)
Total Lymphocyte: 21 %
WBC: 8.9 10*3/uL (ref 3.8–10.8)

## 2021-10-10 LAB — COMPLETE METABOLIC PANEL WITH GFR
AG Ratio: 1.4 (calc) (ref 1.0–2.5)
ALT: 22 U/L (ref 9–46)
AST: 18 U/L (ref 10–35)
Albumin: 4.2 g/dL (ref 3.6–5.1)
Alkaline phosphatase (APISO): 73 U/L (ref 35–144)
BUN: 14 mg/dL (ref 7–25)
CO2: 28 mmol/L (ref 20–32)
Calcium: 10.1 mg/dL (ref 8.6–10.3)
Chloride: 104 mmol/L (ref 98–110)
Creat: 0.97 mg/dL (ref 0.70–1.30)
Globulin: 3 g/dL (calc) (ref 1.9–3.7)
Glucose, Bld: 84 mg/dL (ref 65–99)
Potassium: 4.1 mmol/L (ref 3.5–5.3)
Sodium: 140 mmol/L (ref 135–146)
Total Bilirubin: 0.3 mg/dL (ref 0.2–1.2)
Total Protein: 7.2 g/dL (ref 6.1–8.1)
eGFR: 90 mL/min/{1.73_m2} (ref >=60)

## 2021-10-10 LAB — SEDIMENTATION RATE: Sed Rate: 19 mm/h (ref 0–20)

## 2021-10-10 LAB — C-REACTIVE PROTEIN: CRP: 7.3 mg/L (ref <8.0)

## 2021-11-13 ENCOUNTER — Other Ambulatory Visit (HOSPITAL_COMMUNITY): Payer: Self-pay

## 2021-11-13 ENCOUNTER — Other Ambulatory Visit: Payer: Self-pay | Admitting: Pharmacist

## 2021-11-13 DIAGNOSIS — M4620 Osteomyelitis of vertebra, site unspecified: Secondary | ICD-10-CM

## 2021-11-13 MED ORDER — CEFADROXIL 500 MG PO CAPS
1000.0000 mg | ORAL_CAPSULE | Freq: Two times a day (BID) | ORAL | 5 refills | Status: DC
  Filled 2021-11-13: qty 120, 30d supply, fill #0

## 2021-12-10 ENCOUNTER — Encounter: Payer: Self-pay | Admitting: Internal Medicine

## 2021-12-10 ENCOUNTER — Ambulatory Visit: Payer: 59 | Admitting: Internal Medicine

## 2021-12-10 ENCOUNTER — Other Ambulatory Visit: Payer: Self-pay

## 2021-12-10 VITALS — BP 125/84 | HR 98 | Temp 99.0°F | Ht 72.0 in | Wt 274.0 lb

## 2021-12-10 DIAGNOSIS — M4620 Osteomyelitis of vertebra, site unspecified: Secondary | ICD-10-CM | POA: Diagnosis not present

## 2021-12-10 DIAGNOSIS — T847XXD Infection and inflammatory reaction due to other internal orthopedic prosthetic devices, implants and grafts, subsequent encounter: Secondary | ICD-10-CM

## 2021-12-10 NOTE — Progress Notes (Signed)
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   MRN: [id number]  Chief Complaint  Patient presents with   Follow-up    HPI:  AETHAN BARNABA is a 61 y.o. male here for hospital f/u ecoli bacteremia/t8-9 osteo  He is here earlier than advised due to severe nausea/vomiting Hasn't been able to keep anything down, feels tired Zofran not helping Reports while in hospital iv phenergan help  Didn't want to try suppository phenergan  Back pain moderate-severe not helped much with hydrocodone No f/c Picc site no issue  ----------- 06/04/21 id f/u Due to severe nausea we had agreed to switch to ceftriaxone on 4/03 He still have severe n/v. His last bun/cr on 3/30 was 18/0.93 No f/c He feels weak/tired No chest pain/sob Intermittent cough No arms pain/numbness  He was seen in ed 3/30 for spurious lab of k in the 6's but repeat testing there was 3.2. he was given 1 liter normal saline and iv zofran and discharged  No headache Passing flatus and bowel movement -- not diarrhea    He is currently only taking phenergan   09/03/21 id clinic f/u Patient was recently admitted to [facility] cone 5/10-6/01. Reviewed chart He was admitted for increased lower back pain with ct imaging showing progressive erosion of 58-9. He subsequently underwent t8-9 corpectomy and t5-11 fusion. Bcx negative; no  sepsis. 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 Of note his crp was rising just prior to this admission His cefadroxil was stopped during that admission and he was restarted on ceftriaxone with end date 09/04/21 He was discharged to [facility] sn[facility]. His picc came out 3 cm on September 27, 2022 and was removed. He last received ceftriaxone on Sep 27, 2022 His father passed away father's day No fever, chill No n/v/diarrhea   He is wearing tlso brace when out of bed  Pain is tolerable -- he is off narcotics...  No new weakness/numbness/tingling in his legs No urinary incontinence   Walking about 500 ft. Standup on his own Eating well   12/10/21 id clinic f/u Some pain Not doing pt/ot due to cost He wonders when he can stop abx No abx side effect Tingling and legs sleeping at times when he stands up and he doesn't feel safe  He no longer is wearing tlso brace at this point   No Known Allergies    Outpatient Medications Prior to Visit  Medication Sig Dispense Refill   cefadroxil (DURICEF) 500 MG capsule Take 2 capsules (1,000 mg total) by mouth 2 (two) times daily. 120 capsule 5   lisinopril-hydrochlorothiazide (ZESTORETIC) 10-12.5 MG tablet Take 0.5 tablets by mouth daily.     acetaminophen (TYLENOL) 500 MG tablet Take 2 tablets (1,000 mg total) by mouth every 6 (six) hours. (Patient not taking:  Reported on 10/08/2021) 30 tablet 0   bisacodyl (DULCOLAX) 10 MG suppository Place 1 suppository (10 mg total) rectally as needed for moderate constipation. (Patient not taking: Reported on 10/08/2021) 12 suppository 0   docusate sodium (COLACE) 100 MG capsule Take 1 capsule (100 mg total) by mouth 2 (two) times daily. (Patient not taking: Reported on 10/08/2021) 60 capsule 2   feeding supplement (ENSURE ENLIVE / ENSURE PLUS) LIQD Take 237 mLs by mouth 3 (three) times daily between meals. (Patient not taking: Reported on 10/08/2021) 237 mL 12   gabapentin  (NEURONTIN) 300 MG capsule Take 1 capsule (300 mg total) by mouth 3 (three) times daily. (Patient not taking: Reported on 10/08/2021) 60 capsule 0   methocarbamol (ROBAXIN) 500 MG tablet Take 1 tablet (500 mg total) by mouth every 6 (six) hours as needed for muscle spasms. (Patient not taking: Reported on 10/08/2021) 30 tablet 1   Multiple Vitamin (MULTIVITAMIN WITH MINERALS) TABS tablet Take 1 tablet by mouth daily. (Patient not taking: Reported on 10/08/2021)     pantoprazole (PROTONIX) 40 MG tablet Take 1 tablet (40 mg total) by mouth daily for 14 days. (Patient not taking: Reported on 10/08/2021) 14 tablet 0   polyethylene glycol (MIRALAX / GLYCOLAX) 17 g packet Take 17 g by mouth daily. (Patient not taking: Reported on 10/08/2021) 14 each 0   No facility-administered medications prior to visit.     Social History   Socioeconomic History   Marital status: Married    Spouse name: Not on file   Number of children: Not on file   Years of education: Not on file   Highest education level: Not on file  Occupational History   Occupation: truck driver  Tobacco Use   Smoking status: Never   Smokeless tobacco: Never  Vaping Use   Vaping Use: Never used  Substance and Sexual Activity   Alcohol use: Not Currently   Drug use: Never   Sexual activity: Not Currently  Other Topics Concern   Not on file  Social History Narrative   Not on file   Social Determinants of Health   Financial Resource Strain: Not on file  Food Insecurity: Not on file  Transportation Needs: Not on file  Physical Activity: Not on file  Stress: Not on file  Social Connections: Not on file  Intimate Partner Violence: Not on file      Review of Systems    All other ros negative  Objective:    BP 125/84   Pulse 98   Temp 99 F (37.2 C) (Temporal)   Ht 6' (1.829 m)   Wt 274 lb (124.3 kg) Comment: pt in w/c; stated weight (taken approximately 2 weeks ago)  SpO2 96%   BMI 37.16 kg/m  Nursing note and vital  signs reviewed.  Physical Exam     General/constitutional: no distress, pleasant; in wheel chair HEENT: Normocephalic, PER, Conj Clear, EOMI, Oropharynx clear Neck supple CV: rrr no mrg Lungs: clear to auscultation, normal respiratory effort Abd: Soft, Nontender Ext: no edema Skin: No Rash Neuro: nonfocal; 4-5/5 strength symmetric strength bilateral LE (he could walk MSK: no peripheral joint swelling/tenderness/warmth; back spines nontender    Labs: Lab Results  Component Value Date   WBC 8.9 10/08/2021   HGB 15.7 10/08/2021   HCT 49.2 10/08/2021   MCV 90.9 10/08/2021   PLT 258 12/27/2534   Last metabolic panel Lab Results  Component Value Date   GLUCOSE 84 10/08/2021   NA 140 10/08/2021  K 4.1 10/08/2021   CL 104 10/08/2021   CO2 28 10/08/2021   BUN 14 10/08/2021   CREATININE 0.97 10/08/2021   GFRNONAA >60 07/29/2021   CALCIUM 10.1 10/08/2021   PHOS 2.9 06/25/2021   PROT 7.2 10/08/2021   ALBUMIN 2.7 (L) 07/21/2021   BILITOT 0.3 10/08/2021   ALKPHOS 65 07/21/2021   AST 18 10/08/2021   ALT 22 10/08/2021   ANIONGAP 6 07/29/2021   Crp: 10/08/21     7 (normal) 05/25/21    82  Micro:  Serology:  Imaging:  Assessment & Plan:   Problem List Items Addressed This Visit   None Visit Diagnoses     Vertebral osteomyelitis ([place])    -  Primary   Relevant Orders   C-reactive protein   Hardware complicating wound infection, subsequent encounter       Relevant Orders   C-reactive protein      No orders of the defined types were placed in this encounter.  61 yo male initially (05/16/21) treated for ecoli bacteremia and t8-9 verteral osteomyelitis with cefazolin --> ceftriaxone (due to severe n/v) on 4/01 then maintained on cefadroxil, but increased pain/progressive ct erosion of t8-9 readmitted 5/10-6/1 underwent t8-9 corpectomy with t5-11 spinal fusion on 5/18  He was restarted on ceftriaxone which will finish roughly another 8 weeks   Given hardware  presence will continue cefadroxil for now and then potentially amoxicillin for a more narrowed spectrum abx use. Plan 3 months abx and monitor off as his hardware was placed at end of infection treatment  12/10/21 id assessment Doing well minimal sx and no abx side effect Discuss data for hardware associated infection extrapolated from pji data I would go at least 6-12 months out before feeling comfortable considering stopping abx given this is spine infection and also how severely affected he was and no side effect from abx  I would consider reasonable in 3 more months to go down to 500 mg po bid and at a year potentially 1 tablet daily for chronic suppression if his issue is pill burden   -crp today -f/u 3 months   Follow-up: Return in about 3 months (around 03/12/2022).    Jabier Mutton, [facility] [phone]  pager   [phone] cell 12/10/2021, 2:00 PM

## 2021-12-10 NOTE — Patient Instructions (Signed)
Lab today  Continue 1 gram cefadroxil twice a day abx  See me in 3 months

## 2021-12-11 LAB — C-REACTIVE PROTEIN: CRP: 4.1 mg/L (ref <8.0)

## 2022-01-14 ENCOUNTER — Telehealth: Payer: Self-pay | Admitting: Pharmacist

## 2022-01-14 NOTE — Telephone Encounter (Signed)
Patient called about having issues with mouth ulcers for a patient on cefadroxil who follows with Dr. Renold Don.  I spoke to his wife over the phone who stated he had thrush a while back when he was in the hospital and the patient was treated with nystatin at that time. He described the felling as being pretty similar to the thrush. His wife says his mouth looks really red and puffy, but she didn't notice any white patches when she looked, however she noted that she did not use a flashlight. I asked if she could attach a picture to a MyChart message for Dr. Renold Don to review so an allergic reaction can be ruled out prior to sending in a prescription for nystatin. She stated she would do this as soon as possible. I advised her that she could use phenol spry for immediate relief In the meantime as it is an OTC medication.   Jani Gravel, PharmD PGY-2 Infectious Diseases Resident  01/14/2022 2:36 PM

## 2022-01-16 ENCOUNTER — Telehealth: Payer: Self-pay | Admitting: Pharmacist

## 2022-01-16 NOTE — Telephone Encounter (Signed)
Called and spoke to patient's wife [name] regarding the patient's mouth sores while on cefadroxil. She reports he is feeling better and feels he does not require evaluation at this time. I advised her that she could always send a MyChart message containing a picture if he has this issue recur, or schedule an appointment with Dr. Kayleen Memos, PharmD PGY-2 Infectious Diseases Resident  01/16/2022 2:06 PM

## 2022-03-31 ENCOUNTER — Other Ambulatory Visit: Payer: Self-pay

## 2022-03-31 ENCOUNTER — Ambulatory Visit: Payer: 59 | Admitting: Internal Medicine

## 2022-03-31 ENCOUNTER — Encounter: Payer: Self-pay | Admitting: Internal Medicine

## 2022-03-31 VITALS — BP 131/86 | HR 95 | Temp 98.2°F | Ht 72.0 in | Wt 321.0 lb

## 2022-03-31 DIAGNOSIS — T847XXD Infection and inflammatory reaction due to other internal orthopedic prosthetic devices, implants and grafts, subsequent encounter: Secondary | ICD-10-CM | POA: Diagnosis not present

## 2022-03-31 DIAGNOSIS — M4620 Osteomyelitis of vertebra, site unspecified: Secondary | ICD-10-CM

## 2022-03-31 MED ORDER — CEFADROXIL 500 MG PO CAPS: 500.0000 mg | ORAL_CAPSULE | Freq: Two times a day (BID) | ORAL | 1 refills | Status: DC

## 2022-03-31 NOTE — Patient Instructions (Signed)
Let's reduce cefadroxil to 1 tablet twice day   See me in summer and will see about going to 1 tablet once a day or completely off if you continue to do well

## 2022-03-31 NOTE — Progress Notes (Signed)
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   MRN: [id number]  Chief Complaint  Patient presents with   Follow-up    HPI:  ROWE WARMAN is a 62 y.o. male here for hospital f/u ecoli bacteremia/t8-9 osteo  He is here earlier than advised due to severe nausea/vomiting Hasn't been able to keep anything down, feels tired Zofran not helping Reports while in hospital iv phenergan help  Didn't want to try suppository phenergan  Back pain moderate-severe not helped much with hydrocodone No f/c Picc site no issue  ----------- 06/04/21 id f/u Due to severe nausea we had agreed to switch to ceftriaxone on 4/03 He still have severe n/v. His last bun/cr on 3/30 was 18/0.93 No f/c He feels weak/tired No chest pain/sob Intermittent cough No arms pain/numbness  He was seen in ed 3/30 for spurious lab of k in the 6's but repeat testing there was 3.2. he was given 1 liter normal saline and iv zofran and discharged  No headache Passing flatus and bowel movement -- not diarrhea    He is currently only taking phenergan   09/03/21 id clinic f/u Patient was recently admitted to [facility] 5/10-6/01. Reviewed chart He was admitted for increased lower back pain with ct imaging showing progressive erosion of 58-9. He subsequently underwent t8-9 corpectomy and t5-11 fusion. Bcx negative; no  sepsis. 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 Of note his crp was rising just prior to this admission His cefadroxil was stopped during that admission and he was restarted on ceftriaxone with end date 09/04/21 He was discharged to [facility]. His picc came out 3 cm on 09-19-22 and was removed. He last received ceftriaxone on 2022-09-19 His father passed away father's day No fever, chill No n/v/diarrhea   He is wearing tlso brace when out of bed  Pain is tolerable -- he is off narcotics...  No new weakness/numbness/tingling in his legs No urinary incontinence   Walking about 500 ft. Standup on his own Eating well   12/10/21 id clinic f/u Some pain Not doing pt/ot due to cost He wonders when he can stop abx No abx side effect Tingling and legs sleeping at times when he stands up and he doesn't feel safe  He no longer is wearing tlso brace at this point  03/31/22 id clinic f/u Patient doing well. Walking now. Last visit was in wheelchair We reviewed his labs again; crp has been normal since 10/08/21. He has been on abx for 9 months now since initial infection and lumbar surgery 07/17/2021 No abx side effect He is taking cefadroxil 1 gram twice a day Mild-moderate pain occasionally laying the wrong way; not taking any pain meds. Normally doesn't have pain any more  No Known Allergies  Outpatient Medications Prior to Visit  Medication Sig Dispense Refill   cefadroxil (DURICEF) 500 MG capsule Take 2 capsules (1,000 mg total) by mouth 2 (two) times daily. 120 capsule 5   acetaminophen (TYLENOL) 500 MG tablet Take 2 tablets (1,000 mg total) by mouth every 6 (six) hours. (Patient not taking: Reported on 10/08/2021) 30 tablet 0   bisacodyl (DULCOLAX) 10 MG suppository Place 1 suppository (10 mg total) rectally as needed for moderate constipation. (Patient not taking: Reported on 10/08/2021) 12 suppository 0   docusate sodium (COLACE) 100  MG capsule Take 1 capsule (100 mg total) by mouth 2 (two) times daily. (Patient not taking: Reported on 10/08/2021) 60 capsule 2   feeding supplement (ENSURE ENLIVE / ENSURE PLUS) LIQD Take 237 mLs by mouth 3 (three) times daily between meals. (Patient not taking: Reported on 10/08/2021) 237 mL 12   gabapentin (NEURONTIN) 300 MG capsule Take 1 capsule (300 mg total) by mouth 3 (three) times daily. (Patient not taking: Reported on 10/08/2021) 60 capsule 0   lisinopril-hydrochlorothiazide (ZESTORETIC) 10-12.5 MG tablet Take 0.5 tablets by mouth daily. (Patient not taking: Reported on 03/31/2022)     methocarbamol (ROBAXIN) 500 MG tablet Take 1 tablet (500 mg total) by mouth every 6 (six) hours as needed for muscle spasms. (Patient not taking: Reported on 10/08/2021) 30 tablet 1   Multiple Vitamin (MULTIVITAMIN WITH MINERALS) TABS tablet Take 1 tablet by mouth daily. (Patient not taking: Reported on 10/08/2021)     pantoprazole (PROTONIX) 40 MG tablet Take 1 tablet (40 mg total) by mouth daily for 14 days. (Patient not taking: Reported on 10/08/2021) 14 tablet 0   polyethylene glycol (MIRALAX / GLYCOLAX) 17 g packet Take 17 g by mouth daily. (Patient not taking: Reported on 10/08/2021) 14 each 0   No facility-administered medications prior to visit.     Social History   Socioeconomic History   Marital status: Married    Spouse name: Not on file   Number of children: Not on file   Years of education: Not on file   Highest education level: Not on file  Occupational History   Occupation: truck driver  Tobacco Use   Smoking status: Never   Smokeless tobacco: Never  Vaping Use   Vaping Use: Never used  Substance and Sexual Activity   Alcohol use: Not Currently   Drug use: Never   Sexual activity: Not Currently  Other Topics Concern   Not on file  Social History Narrative   Not on file   Social Determinants of Health   Financial Resource Strain: Not on file  Food Insecurity: Not on file   Transportation Needs: Not on file  Physical Activity: Not on file  Stress: Not on file  Social Connections: Not on file  Intimate Partner Violence: Not on file      Review of Systems    All other ros negative  Objective:    BP 131/86   Pulse 95   Temp 98.2 F (36.8 C) (Temporal)   Ht 6' (1.829 m)   Wt (!) 321 lb (145.6 kg)   SpO2 96%   BMI 43.54 kg/m  Nursing note and vital signs reviewed.  Physical Exam     General/constitutional: no distress, pleasant; in wheel chair HEENT: Normocephalic, PER, Conj Clear, EOMI, Oropharynx clear Neck supple CV: rrr no mrg Lungs: clear to auscultation, normal respiratory effort Abd: Soft, Nontender Ext: no edema Skin: No Rash Neuro: nonfocal     Labs: Lab Results  Component Value Date   WBC 8.9 10/08/2021   HGB 15.7 10/08/2021   HCT 49.2 10/08/2021   MCV 90.9 10/08/2021   PLT 258 37/12/6267   Last metabolic panel Lab Results  Component Value Date   GLUCOSE 84 10/08/2021   NA 140 10/08/2021   K 4.1 10/08/2021   CL 104 10/08/2021   CO2 28 10/08/2021   BUN 14 10/08/2021   CREATININE 0.97 10/08/2021   GFRNONAA >60 07/29/2021   CALCIUM 10.1 10/08/2021   PHOS 2.9 06/25/2021   PROT 7.2 10/08/2021   ALBUMIN 2.7 (L) 07/21/2021   BILITOT 0.3 10/08/2021   ALKPHOS 65 07/21/2021   AST 18 10/08/2021   ALT 22 10/08/2021   ANIONGAP 6 07/29/2021   Crp: 11/2021    4.1 10/08/21     7 (normal) 05/25/21    82  Micro:  Serology:  Imaging:  Assessment & Plan:   Problem List Items Addressed This Visit   None Visit Diagnoses     Vertebral osteomyelitis ([place])    -  Primary   Hardware complicating wound infection, subsequent encounter          62 yo male initially (05/16/21) treated for ecoli bacteremia and t8-9 verteral osteomyelitis with cefazolin --> ceftriaxone (due to severe n/v) on 4/01 then maintained on cefadroxil, but increased pain/progressive ct erosion of t8-9 readmitted 5/10-6/1 underwent t8-9 corpectomy  with t5-11 spinal fusion on 5/18  He was restarted on ceftriaxone which will finish roughly another 8 weeks   Given hardware presence will continue cefadroxil for now and then potentially amoxicillin for a more narrowed spectrum abx use. Plan 3 months abx and monitor off as his hardware was placed at end of infection treatment  12/10/21 id assessment Doing well minimal sx and no abx side effect Discuss data for hardware associated infection extrapolated from pji data I would go at least 6-12 months out before feeling comfortable considering stopping abx given this is spine infection and also how severely affected he was and no side effect from abx  I would consider reasonable in 3 more months to go down to 500 mg po bid and at a year potentially 1 tablet daily for chronic suppression if his issue is pill burden   -crp today -f/u 3 months   03/31/22 id assessment Doing very well. Has been 7 months with normal crp and continued pain improvement. I think it is reasonable to go down to 500 mg cefadroxil bid He asks about when he could go off meds completely or one tablet once a day Crp today    Follow-up: Return in about 6 months (around 09/29/2022).    Jabier Mutton, [place] for [facility] [phone]  pager   [phone] cell 03/31/2022, 11:51 AM

## 2022-04-01 LAB — C-REACTIVE PROTEIN: CRP: 6.4 mg/L (ref <8.0)

## 2022-08-03 ENCOUNTER — Telehealth: Payer: Self-pay

## 2022-08-03 NOTE — Telephone Encounter (Signed)
Patient's wife called concerned that Michael [name] has infection in his jaw. She reports he's been complaining of pain for 3-4 days, denies any other symptoms. He does have a history of toothaches, but he is reluctant to go to the dentist and has not been in quite some time. She is wanting to know if it's possible for him to get an infection in his jaw while he is on cefadroxil. We discussed that cefadroxil does not universally protect against infection.   Sandie Ano, RN

## 2022-08-03 NOTE — Telephone Encounter (Signed)
Spoke with [name], advised her that Dr. Renold Don recommends Jonny Ruiz see a dentist.   Sandie Ano, RN

## 2022-08-13 ENCOUNTER — Other Ambulatory Visit: Payer: Self-pay | Admitting: Internal Medicine

## 2022-09-29 ENCOUNTER — Ambulatory Visit: Payer: 59 | Admitting: Internal Medicine

## 2022-09-29 ENCOUNTER — Encounter: Payer: Self-pay | Admitting: Internal Medicine

## 2022-09-29 ENCOUNTER — Other Ambulatory Visit: Payer: Self-pay

## 2022-09-29 VITALS — BP 152/93 | HR 102 | Temp 97.5°F

## 2022-09-29 DIAGNOSIS — M4624 Osteomyelitis of vertebra, thoracic region: Secondary | ICD-10-CM

## 2022-09-29 DIAGNOSIS — T847XXD Infection and inflammatory reaction due to other internal orthopedic prosthetic devices, implants and grafts, subsequent encounter: Secondary | ICD-10-CM

## 2022-09-29 DIAGNOSIS — M4620 Osteomyelitis of vertebra, site unspecified: Secondary | ICD-10-CM

## 2022-09-29 MED ORDER — CEFADROXIL 500 MG PO CAPS: 500.0000 mg | ORAL_CAPSULE | Freq: Two times a day (BID) | ORAL | 3 refills | Status: DC

## 2022-09-29 NOTE — Patient Instructions (Signed)
Let's decrease your cefadroxil to 500 mg one tablet once a day   Labs today   Revisit in 6 months or sooner if any concern about infection of the back

## 2022-09-29 NOTE — Progress Notes (Signed)
[facility]  Patient Active Problem List   Diagnosis Date Noted   Osteomyelitis of thoracic region (HCC) 07/09/2021   Nausea and vomiting    Constipation    Pressure injury of skin 06/23/2021   Thoracic discitis 06/12/2021   Class 1 obesity due to excess calories with body mass index (BMI) of 34.0 to 34.9 in adult 06/12/2021   Oral thrush 06/12/2021   Poor dentition 06/12/2021   Gram-negative bacteremia 05/17/2021   Essential hypertension 05/16/2021   Obesity, Class III, BMI 40-49.9 (morbid obesity) (HCC) 05/16/2021      Subjective:    Patient ID: Michael [name], male    DOB: 09-21-1960, 62 y.o.   MRN: [id number]  Chief Complaint  Patient presents with   Follow-up    HPI:  Michael [name] is a 62 y.o. male here for hospital f/u ecoli bacteremia/t8-9 osteo  He is here earlier than advised due to severe nausea/vomiting Hasn't been able to keep anything down, feels tired Zofran not helping Reports while in hospital iv phenergan help  Didn't want to try suppository phenergan  Back pain moderate-severe not helped much with hydrocodone No f/c Picc site no issue  ----------- 06/04/21 id f/u Due to severe nausea we had agreed to switch to ceftriaxone on 4/03 He still have severe n/v. His last bun/cr on 3/30 was 18/0.93 No f/c He feels weak/tired No chest pain/sob Intermittent cough No arms pain/numbness  He was seen in ed 3/30 for spurious lab of k in the 6's but repeat testing there was 3.2. he was given 1 liter normal saline and iv zofran and discharged  No headache Passing flatus and bowel movement -- not diarrhea    He is currently only taking phenergan   09/03/21 id clinic f/u Patient was recently admitted to [facility] 5/10-6/01. Reviewed chart He was admitted for increased lower back pain with ct imaging showing progressive erosion of 58-9. He subsequently underwent t8-9 corpectomy and t5-11 fusion. Bcx negative; no  sepsis. 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 Of note his crp was rising just prior to this admission His cefadroxil was stopped during that admission and he was restarted on ceftriaxone with end date 09/04/21 He was discharged to [facility] [place] [facility]. His picc came out 3 cm on 03-Sep-2022 and was removed. He last received ceftriaxone on 2022-09-03 His father passed away father's day No fever, chill No n/v/diarrhea   He is wearing tlso brace when out of bed  Pain is tolerable -- he is off narcotics...  No new weakness/numbness/tingling in his legs No urinary incontinence   Walking about 500 ft. Standup on his own Eating well   12/10/21 id clinic f/u Some pain Not doing pt/ot due to cost He wonders when he can stop abx No abx side effect Tingling and legs sleeping at times when he stands up and he doesn't feel safe  He no longer is wearing tlso brace at this point  03/31/22 id clinic f/u Patient doing well. Walking now. Last visit was in wheelchair We reviewed his labs again; crp has been normal since 10/08/21. He has been on abx for 9 months now since initial infection and lumbar surgery 07/17/2021 No abx side effect He is taking cefadroxil 1 gram twice a day Mild-moderate pain occasionally laying the wrong way; not taking any pain meds. Normally doesn't have pain any more   09/29/22 id  clinic f/u See a&p for detail  No Known Allergies    Outpatient Medications Prior to Visit  Medication Sig Dispense Refill   acetaminophen (TYLENOL) 500 MG tablet Take 2 tablets (1,000 mg total) by mouth every 6 (six) hours. (Patient not taking: Reported on 10/08/2021) 30 tablet 0   bisacodyl (DULCOLAX) 10 MG suppository Place 1 suppository (10 mg total) rectally as needed for moderate constipation. (Patient not taking: Reported on 10/08/2021) 12 suppository 0   cefadroxil (DURICEF) 500 MG capsule TAKE 1 CAPSULE(500 MG) BY MOUTH TWICE DAILY 180 capsule 0    feeding supplement (ENSURE ENLIVE / ENSURE PLUS) LIQD Take 237 mLs by mouth 3 (three) times daily between meals. (Patient not taking: Reported on 10/08/2021) 237 mL 12   gabapentin (NEURONTIN) 300 MG capsule Take 1 capsule (300 mg total) by mouth 3 (three) times daily. (Patient not taking: Reported on 10/08/2021) 60 capsule 0   lisinopril-hydrochlorothiazide (ZESTORETIC) 10-12.5 MG tablet Take 0.5 tablets by mouth daily. (Patient not taking: Reported on 03/31/2022)     methocarbamol (ROBAXIN) 500 MG tablet Take 1 tablet (500 mg total) by mouth every 6 (six) hours as needed for muscle spasms. (Patient not taking: Reported on 10/08/2021) 30 tablet 1   Multiple Vitamin (MULTIVITAMIN WITH MINERALS) TABS tablet Take 1 tablet by mouth daily. (Patient not taking: Reported on 10/08/2021)     pantoprazole (PROTONIX) 40 MG tablet Take 1 tablet (40 mg total) by mouth daily for 14 days. (Patient not taking: Reported on 10/08/2021) 14 tablet 0   polyethylene glycol (MIRALAX / GLYCOLAX) 17 g packet Take 17 g by mouth daily. (Patient not taking: Reported on 10/08/2021) 14 each 0   No facility-administered medications prior to visit.     Social History   Socioeconomic History   Marital status: Married    Spouse name: Not on file   Number of children: Not on file   Years of education: Not on file   Highest education level: Not on file  Occupational History   Occupation: truck driver  Tobacco Use   Smoking status: Never   Smokeless tobacco: Never  Vaping Use   Vaping status: Never Used  Substance and Sexual Activity   Alcohol use: Not Currently   Drug use: Never   Sexual activity: Not Currently  Other Topics Concern   Not on file  Social History Narrative   Not on file   Social Determinants of Health   Financial Resource Strain: Not on file  Food Insecurity: Not on file  Transportation Needs: Not on file  Physical Activity: Not on file  Stress: Not on file  Social Connections: Not on file  Intimate  Partner Violence: Not on file      Review of Systems    All other ros negative  Objective:    BP (!) 152/93   Pulse (!) 102   Temp (!) 97.5 F (36.4 C) (Temporal)   SpO2 96%  Nursing note and vital signs reviewed.  Physical Exam     General/constitutional: no distress, pleasant; walking with a cane HEENT: Normocephalic, PER, Conj Clear, EOMI, Oropharynx clear Neck supple CV: rrr no mrg Lungs: clear to auscultation, normal respiratory effort Abd: Soft, Nontender Ext: no edema Skin: No Rash Neuro: nonfocal     Labs: Lab Results  Component Value Date   WBC 8.9 10/08/2021   HGB 15.7 10/08/2021   HCT 49.2 10/08/2021   MCV 90.9 10/08/2021   PLT 258 10/08/2021   Last metabolic panel Lab Results  Component Value Date   GLUCOSE 84 10/08/2021   NA 140 10/08/2021   K 4.1 10/08/2021   CL 104 10/08/2021   CO2 28 10/08/2021   BUN 14 10/08/2021   CREATININE 0.97 10/08/2021   GFRNONAA >60 07/29/2021   CALCIUM 10.1 10/08/2021   PHOS 2.9 06/25/2021   PROT 7.2 10/08/2021   ALBUMIN 2.7 (L) 07/21/2021   BILITOT 0.3 10/08/2021   ALKPHOS 65 07/21/2021   AST 18 10/08/2021   ALT 22 10/08/2021   ANIONGAP 6 07/29/2021   Crp: 03/31/22   6.4 11/2021    4.1 10/08/21     7 (normal) 05/25/21    82  Micro:  Serology:  Imaging:  Assessment & Plan:   Problem List Items Addressed This Visit   None Visit Diagnoses     Vertebral osteomyelitis (HCC)    -  Primary   Relevant Medications   cefadroxil (DURICEF) 500 MG capsule   Other Relevant Orders   C-reactive protein   CBC   Comp Met (CMET)   Hardware complicating wound infection, subsequent encounter       Relevant Medications   cefadroxil (DURICEF) 500 MG capsule   Other Relevant Orders   C-reactive protein   CBC   Comp Met (CMET)       Abx:  Cefadroxil since 08/2021  62 yo male initially (05/16/21) treated for ecoli bacteremia and t8-9 verteral osteomyelitis with cefazolin --> ceftriaxone (due to severe  n/v) on 4/01 then maintained on cefadroxil, but increased pain/progressive ct erosion of t8-9 readmitted 5/10-6/1 underwent t8-9 corpectomy with t5-11 spinal fusion on 07/17/21  He was restarted on ceftriaxone which will finish roughly another 8 weeks   Given hardware presence will continue cefadroxil for now and then potentially amoxicillin for a more narrowed spectrum abx use. Plan 3 months abx and monitor off as his hardware was placed at end of infection treatment  12/10/21 id assessment Doing well minimal sx and no abx side effect Discuss data for hardware associated infection extrapolated from pji data I would go at least 6-12 months out before feeling comfortable considering stopping abx given this is spine infection and also how severely affected he was and no side effect from abx  I would consider reasonable in 3 more months to go down to 500 mg po bid and at a year potentially 1 tablet daily for chronic suppression if his issue is pill burden   -crp today -f/u 3 months   03/31/22 id assessment Doing very well. Has been 7 months with normal crp and continued pain improvement. I think it is reasonable to go down to 500 mg cefadroxil bid He asks about when he could go off meds completely or one tablet once a day Crp today  09/29/22 id clinic assessment A year into medication now (14 months) since I&D and hardware placement Walking with cane now instead of wheel chair Taking 1 tramadol on average a day as need for pain Wants to remain on suppressive therapy but 1 tablet once a day   -labs today -f/u 6 months or sooner as needed if sx of concern on back infection   Follow-up: Return in about 6 months (around 04/01/2023).    Raymondo Band, MD [facility] for Infectious Disease [facility] [phone]  pager   [phone] cell 09/29/2022, 1:54 PM

## 2023-04-01 ENCOUNTER — Other Ambulatory Visit: Payer: Self-pay

## 2023-04-01 ENCOUNTER — Ambulatory Visit (INDEPENDENT_AMBULATORY_CARE_PROVIDER_SITE_OTHER): Payer: 59 | Admitting: Internal Medicine

## 2023-04-01 ENCOUNTER — Encounter: Payer: Self-pay | Admitting: Internal Medicine

## 2023-04-01 VITALS — BP 136/91 | HR 88 | Temp 97.6°F | Ht 72.0 in | Wt 373.0 lb

## 2023-04-01 DIAGNOSIS — T847XXD Infection and inflammatory reaction due to other internal orthopedic prosthetic devices, implants and grafts, subsequent encounter: Secondary | ICD-10-CM | POA: Diagnosis not present

## 2023-04-01 DIAGNOSIS — M4624 Osteomyelitis of vertebra, thoracic region: Secondary | ICD-10-CM

## 2023-04-01 DIAGNOSIS — M4620 Osteomyelitis of vertebra, site unspecified: Secondary | ICD-10-CM

## 2023-04-01 DIAGNOSIS — K0889 Other specified disorders of teeth and supporting structures: Secondary | ICD-10-CM

## 2023-04-01 MED ORDER — AMOXICILLIN-POT CLAVULANATE 875-125 MG PO TABS: 1.0000 | ORAL_TABLET | Freq: Two times a day (BID) | ORAL | 0 refills | Status: AC

## 2023-04-01 NOTE — Progress Notes (Signed)
[facility]  Patient Active Problem List   Diagnosis Date Noted   Osteomyelitis of thoracic region (HCC) 07/09/2021   Nausea and vomiting    Constipation    Pressure injury of skin 06/23/2021   Thoracic discitis 06/12/2021   Class 1 obesity due to excess calories with body mass index (BMI) of 34.0 to 34.9 in adult 06/12/2021   Oral thrush 06/12/2021   Poor dentition 06/12/2021   Gram-negative bacteremia 05/17/2021   Essential hypertension 05/16/2021   Obesity, Class III, BMI 40-49.9 (morbid obesity) (HCC) 05/16/2021      Subjective:    Patient ID: Michael [name], male    DOB: 1960-11-09, 63 y.o.   MRN: [id number]  No chief complaint on file.   HPI:  Michael [name] is a 63 y.o. male here for hospital f/u ecoli bacteremia/t8-9 osteo  He is here earlier than advised due to severe nausea/vomiting Hasn't been able to keep anything down, feels tired Zofran not helping Reports while in hospital iv phenergan help  Didn't want to try suppository phenergan  Back pain moderate-severe not helped much with hydrocodone No f/c Picc site no issue  ----------- 06/04/21 id f/u Due to severe nausea we had agreed to switch to ceftriaxone on 4/03 He still have severe n/v. His last bun/cr on 3/30 was 18/0.93 No f/c He feels weak/tired No chest pain/sob Intermittent cough No arms pain/numbness  He was seen in ed 3/30 for spurious lab of k in the 6's but repeat testing there was 3.2. he was given 1 liter normal saline and iv zofran and discharged  No headache Passing flatus and bowel movement -- not diarrhea    He is currently only taking phenergan   09/03/21 id clinic f/u Patient was recently admitted to [facility] 5/10-6/01. Reviewed chart He was admitted for increased lower back pain with ct imaging showing progressive erosion of 58-9. He subsequently underwent t8-9 corpectomy and t5-11 fusion. Bcx negative; no sepsis. Course complicated  by volume related aki (he did have severe nausea/vomiting with abx). Of note his crp was rising just prior to this admission His cefadroxil was stopped during that admission and he was restarted on ceftriaxone with end date 09/04/21 He was discharged to [facility] [place] [facility]. His picc came out 3 cm on 08-Sep-2023 and was removed. He last received ceftriaxone on 09/08/23 His father passed away father's day No fever, chill No n/v/diarrhea   He is wearing tlso brace when out of bed  Pain is tolerable -- he is off narcotics...  No new weakness/numbness/tingling in his legs No urinary incontinence   Walking about 500 ft. Standup on his own Eating well   12/10/21 id clinic f/u Some pain Not doing pt/ot due to cost He wonders when he can stop abx No abx side effect Tingling and legs sleeping at times when he stands up and he doesn't feel safe  He no longer is wearing tlso brace at this point  03/31/22 id clinic f/u Patient doing well. Walking now. Last visit was in wheelchair We reviewed his labs again; crp has been normal since 10/08/21. He has been on abx for 9 months now since initial infection and lumbar surgery 07/17/2021 No abx side effect He is taking cefadroxil 1 gram twice a day Mild-moderate pain occasionally laying the wrong way; not taking any pain meds. Normally doesn't have pain any more   04/01/23 Ran out of cefadroxil for 3-4  weeks and didn't try to get it so stopped taking Feels well in terms of back No fever, chill, malaise, decreased appetite Feels he wants to stay off antibiotics   No Known Allergies    Outpatient Medications Prior to Visit  Medication Sig Dispense Refill   acetaminophen (TYLENOL) 500 MG tablet Take 2 tablets (1,000 mg total) by mouth every 6 (six) hours. (Patient not taking: Reported on 10/08/2021) 30 tablet 0   bisacodyl (DULCOLAX) 10 MG suppository Place 1 suppository (10 mg total) rectally as needed for moderate constipation. (Patient not taking:  Reported on 10/08/2021) 12 suppository 0   cefadroxil (DURICEF) 500 MG capsule Take 1 capsule (500 mg total) by mouth 2 (two) times daily. 180 capsule 3   feeding supplement (ENSURE ENLIVE / ENSURE PLUS) LIQD Take 237 mLs by mouth 3 (three) times daily between meals. (Patient not taking: Reported on 10/08/2021) 237 mL 12   gabapentin (NEURONTIN) 300 MG capsule Take 1 capsule (300 mg total) by mouth 3 (three) times daily. (Patient not taking: Reported on 10/08/2021) 60 capsule 0   lisinopril-hydrochlorothiazide (ZESTORETIC) 10-12.5 MG tablet Take 0.5 tablets by mouth daily. (Patient not taking: Reported on 03/31/2022)     methocarbamol (ROBAXIN) 500 MG tablet Take 1 tablet (500 mg total) by mouth every 6 (six) hours as needed for muscle spasms. (Patient not taking: Reported on 10/08/2021) 30 tablet 1   Multiple Vitamin (MULTIVITAMIN WITH MINERALS) TABS tablet Take 1 tablet by mouth daily. (Patient not taking: Reported on 10/08/2021)     pantoprazole (PROTONIX) 40 MG tablet Take 1 tablet (40 mg total) by mouth daily for 14 days. (Patient not taking: Reported on 10/08/2021) 14 tablet 0   polyethylene glycol (MIRALAX / GLYCOLAX) 17 g packet Take 17 g by mouth daily. (Patient not taking: Reported on 10/08/2021) 14 each 0   No facility-administered medications prior to visit.     Social History   Socioeconomic History   Marital status: Married    Spouse name: Not on file   Number of children: Not on file   Years of education: Not on file   Highest education level: Not on file  Occupational History   Occupation: truck driver  Tobacco Use   Smoking status: Never   Smokeless tobacco: Never  Vaping Use   Vaping status: Never Used  Substance and Sexual Activity   Alcohol use: Not Currently   Drug use: Never   Sexual activity: Not Currently  Other Topics Concern   Not on file  Social History Narrative   Not on file   Social Drivers of Health   Financial Resource Strain: Not on file  Food Insecurity:  Not on file  Transportation Needs: Not on file  Physical Activity: Not on file  Stress: Not on file  Social Connections: Not on file  Intimate Partner Violence: Not on file      Review of Systems    All other ros negative  Objective:    BP (!) 136/91   Pulse 88   Temp 97.6 F (36.4 C) (Oral)   Ht 6' (1.829 m)   Wt (!) 373 lb (169.2 kg)   SpO2 95%   BMI 50.59 kg/m  Nursing note and vital signs reviewed.  Physical Exam     General/constitutional: no distress, pleasant; walking with a cane HEENT: Normocephalic, PER, Conj Clear, EOMI, Oropharynx clear Neck supple CV: rrr no mrg Lungs: clear to auscultation, normal respiratory effort Abd: Soft, Nontender Ext: no edema Skin: No Rash  Neuro: nonfocal     Labs: Lab Results  Component Value Date   WBC 9.5 09/29/2022   HGB 16.1 09/29/2022   HCT 48.3 09/29/2022   MCV 88.0 09/29/2022   PLT 278 09/29/2022   Last metabolic panel Lab Results  Component Value Date   GLUCOSE 92 09/29/2022   NA 140 09/29/2022   K 4.3 09/29/2022   CL 102 09/29/2022   CO2 28 09/29/2022   BUN 13 09/29/2022   CREATININE 1.11 09/29/2022   GFRNONAA >60 07/29/2021   CALCIUM 9.6 09/29/2022   PHOS 2.9 06/25/2021   PROT 6.9 09/29/2022   ALBUMIN 2.7 (L) 07/21/2021   BILITOT 0.4 09/29/2022   ALKPHOS 65 07/21/2021   AST 18 09/29/2022   ALT 24 09/29/2022   ANIONGAP 6 07/29/2021   Crp: 03/31/22   6.4 11/2021    4.1 10/08/21     7 (normal) 05/25/21    82  Micro:  Serology:  Imaging:  Assessment & Plan:   Problem List Items Addressed This Visit   None Visit Diagnoses       Hardware complicating wound infection, subsequent encounter    -  Primary   Relevant Orders   C-reactive protein   CBC   COMPLETE METABOLIC PANEL WITH GFR     Vertebral osteomyelitis (HCC)       Relevant Orders   C-reactive protein   CBC   COMPLETE METABOLIC PANEL WITH GFR     Pain, dental            Abx:  Cefadroxil since 08/2021 -  03/03/2023  63 yo male initially (05/16/21) treated for ecoli bacteremia and t8-9 verteral osteomyelitis with cefazolin --> ceftriaxone (due to severe n/v) on 4/01 then maintained on cefadroxil, but increased pain/progressive ct erosion of t8-9 readmitted 5/10-6/1 underwent t8-9 corpectomy with t5-11 spinal fusion on 07/17/21  He was restarted on ceftriaxone which will finish roughly another 8 weeks   Given hardware presence will continue cefadroxil for now and then potentially amoxicillin for a more narrowed spectrum abx use. Plan 3 months abx and monitor off as his hardware was placed at end of infection treatment  12/10/21 id assessment Doing well minimal sx and no abx side effect Discuss data for hardware associated infection extrapolated from pji data I would go at least 6-12 months out before feeling comfortable considering stopping abx given this is spine infection and also how severely affected he was and no side effect from abx  I would consider reasonable in 3 more months to go down to 500 mg po bid and at a year potentially 1 tablet daily for chronic suppression if his issue is pill burden   -crp today -f/u 3 months   03/31/22 id assessment Doing very well. Has been 7 months with normal crp and continued pain improvement. I think it is reasonable to go down to 500 mg cefadroxil bid He asks about when he could go off meds completely or one tablet once a day Crp today  09/29/22 id clinic assessment A year into medication now (14 months) since I&D and hardware placement Walking with cane now instead of wheel chair Taking 1 tramadol on average a day as need for pain Wants to remain on suppressive therapy but 1 tablet once a day   -labs today -f/u 6 months or sooner as needed if sx of concern on back infection   04/01/23 id clinic assessment Off cefadroxil almost a month Back doing well only intermittent pain Has dental  pain yet to see dentist -- he asked if need to take any abx  to prevent back infection. I discussed it might/might not work with abx (need to have dental exam make sure nothing serious in terms of infection, and that abx might/might not work in preventing back infection if severe oral infection spread systemically)  With regard to cefadroxil he wants to continue staying off and see   Labs today F/u 6-8 weeks with me    Follow-up: No follow-ups on file.    Raymondo Band, MD [facility] for Infectious Disease [facility] [phone]  pager   [phone] cell 04/01/2023, 10:55 AM

## 2023-04-01 NOTE — Patient Instructions (Signed)
For dental pain, you can try augmentin for a couple weeks But make sure you see your dentist ASAP   See me again in 6-8 weeks   Will continue holding cefadroxil for now

## 2023-04-02 LAB — COMPLETE METABOLIC PANEL WITH GFR
AG Ratio: 1.5 (calc) (ref 1.0–2.5)
ALT: 41 U/L (ref 9–46)
AST: 29 U/L (ref 10–35)
Albumin: 4 g/dL (ref 3.6–5.1)
Alkaline phosphatase (APISO): 66 U/L (ref 35–144)
BUN: 13 mg/dL (ref 7–25)
CO2: 26 mmol/L (ref 20–32)
Calcium: 9.4 mg/dL (ref 8.6–10.3)
Chloride: 107 mmol/L (ref 98–110)
Creat: 1.19 mg/dL (ref 0.70–1.35)
Globulin: 2.7 g/dL (ref 1.9–3.7)
Glucose, Bld: 100 mg/dL — ABNORMAL HIGH (ref 65–99)
Potassium: 4.4 mmol/L (ref 3.5–5.3)
Sodium: 141 mmol/L (ref 135–146)
Total Bilirubin: 0.3 mg/dL (ref 0.2–1.2)
Total Protein: 6.7 g/dL (ref 6.1–8.1)
eGFR: 70 mL/min/{1.73_m2} (ref >=60)

## 2023-04-02 LAB — CBC
HCT: 47.4 % (ref 38.5–50.0)
Hemoglobin: 15.7 g/dL (ref 13.2–17.1)
MCH: 29.2 pg (ref 27.0–33.0)
MCHC: 33.1 g/dL (ref 32.0–36.0)
MCV: 88.3 fL (ref 80.0–100.0)
MPV: 11.2 fL (ref 7.5–12.5)
Platelets: 254 10*3/uL (ref 140–400)
RBC: 5.37 10*6/uL (ref 4.20–5.80)
RDW: 13 % (ref 11.0–15.0)
WBC: 6.4 10*3/uL (ref 3.8–10.8)

## 2023-04-02 LAB — C-REACTIVE PROTEIN: CRP: 17.6 mg/L — ABNORMAL HIGH (ref <8.0)

## 2023-04-02 NOTE — Telephone Encounter (Signed)
Spoke with patient's spouse [name] regarding results. Understands Dr. Renold Don would like to see them back in 4 weeks. Scheduled follow up appt.  Pt will reach out to dentist for follow up as well. Juanita Laster, RMA

## 2023-04-23 ENCOUNTER — Encounter: Payer: Self-pay | Admitting: Internal Medicine

## 2023-04-23 ENCOUNTER — Ambulatory Visit: Payer: 59 | Admitting: Internal Medicine

## 2023-04-23 ENCOUNTER — Other Ambulatory Visit: Payer: Self-pay

## 2023-04-23 VITALS — BP 143/87 | HR 88 | Temp 98.0°F | Ht 72.0 in | Wt 378.0 lb

## 2023-04-23 DIAGNOSIS — T847XXD Infection and inflammatory reaction due to other internal orthopedic prosthetic devices, implants and grafts, subsequent encounter: Secondary | ICD-10-CM | POA: Diagnosis not present

## 2023-04-23 DIAGNOSIS — M4620 Osteomyelitis of vertebra, site unspecified: Secondary | ICD-10-CM | POA: Diagnosis not present

## 2023-04-23 NOTE — Patient Instructions (Signed)
Please continue to monitor your back; if worsening pain let me know   If crp today increases I'll get mri of your back and start cefadroxil again; if crp is down, but worsening pain still I'll get mri but not necessarily start the antibiotics again    See me in around 4-6 weeks

## 2023-04-23 NOTE — Progress Notes (Signed)
[facility]  Patient Active Problem List   Diagnosis Date Noted   Osteomyelitis of thoracic region (HCC) 07/09/2021   Nausea and vomiting    Constipation    Pressure injury of skin 06/23/2021   Thoracic discitis 06/12/2021   Class 1 obesity due to excess calories with body mass index (BMI) of 34.0 to 34.9 in adult 06/12/2021   Oral thrush 06/12/2021   Poor dentition 06/12/2021   Gram-negative bacteremia 05/17/2021   Essential hypertension 05/16/2021   Obesity, Class III, BMI 40-49.9 (morbid obesity) (HCC) 05/16/2021      Subjective:    Patient ID: Michael [name], male    DOB: 03/10/1960, 63 y.o.   MRN: [id number]  No chief complaint on file.   HPI:  [name] is a 63 y.o. male here for hospital f/u ecoli bacteremia/t8-9 osteo  He is here earlier than advised due to severe nausea/vomiting Hasn't been able to keep anything down, feels tired Zofran not helping Reports while in hospital iv phenergan help  Didn't want to try suppository phenergan  Back pain moderate-severe not helped much with hydrocodone No f/c Picc site no issue  ----------- 06/04/21 id f/u Due to severe nausea we had agreed to switch to ceftriaxone on 4/03 He still have severe n/v. His last bun/cr on 3/30 was 18/0.93 No f/c He feels weak/tired No chest pain/sob Intermittent cough No arms pain/numbness  He was seen in ed 3/30 for spurious lab of k in the 6's but repeat testing there was 3.2. he was given 1 liter normal saline and iv zofran and discharged  No headache Passing flatus and bowel movement -- not diarrhea    He is currently only taking phenergan   09/03/21 id clinic f/u Patient was recently admitted to [facility] 5/10-6/01. Reviewed chart He was admitted for increased lower back pain with ct imaging showing progressive erosion of 58-9. He subsequently underwent t8-9 corpectomy and t5-11 fusion. Bcx negative; no sepsis. Course complicated  by volume related aki (he did have severe nausea/vomiting with abx). Of note his crp was rising just prior to this admission His cefadroxil was stopped during that admission and he was restarted on ceftriaxone with end date 09/04/21 He was discharged to [facility]. His picc came out 3 cm on 09-Sep-2023 and was removed. He last received ceftriaxone on Sep 09, 2023 His father passed away father's day No fever, chill No n/v/diarrhea   He is wearing tlso brace when out of bed  Pain is tolerable -- he is off narcotics...  No new weakness/numbness/tingling in his legs No urinary incontinence   Walking about 500 ft. Standup on his own Eating well   12/10/21 id clinic f/u Some pain Not doing pt/ot due to cost He wonders when he can stop abx No abx side effect Tingling and legs sleeping at times when he stands up and he doesn't feel safe  He no longer is wearing tlso brace at this point  03/31/22 id clinic f/u Patient doing well. Walking now. Last visit was in wheelchair We reviewed his labs again; crp has been normal since 10/08/21. He has been on abx for 9 months now since initial infection and lumbar surgery 07/17/2021 No abx side effect He is taking cefadroxil 1 gram twice a day Mild-moderate pain occasionally laying the wrong way; not taking any pain meds. Normally doesn't have pain any more   04/01/23 Ran out of cefadroxil for 3-4  weeks and didn't try to get it so stopped taking Feels well in terms of back No fever, chill, malaise, decreased appetite Feels he wants to stay off antibiotics   No Known Allergies    Outpatient Medications Prior to Visit  Medication Sig Dispense Refill   acetaminophen (TYLENOL) 500 MG tablet Take 2 tablets (1,000 mg total) by mouth every 6 (six) hours. (Patient not taking: Reported on 10/08/2021) 30 tablet 0   bisacodyl (DULCOLAX) 10 MG suppository Place 1 suppository (10 mg total) rectally as needed for moderate constipation. (Patient not taking:  Reported on 10/08/2021) 12 suppository 0   feeding supplement (ENSURE ENLIVE / ENSURE PLUS) LIQD Take 237 mLs by mouth 3 (three) times daily between meals. (Patient not taking: Reported on 10/08/2021) 237 mL 12   gabapentin (NEURONTIN) 300 MG capsule Take 1 capsule (300 mg total) by mouth 3 (three) times daily. (Patient not taking: Reported on 10/08/2021) 60 capsule 0   lisinopril-hydrochlorothiazide (ZESTORETIC) 10-12.5 MG tablet Take 0.5 tablets by mouth daily. (Patient not taking: Reported on 03/31/2022)     methocarbamol (ROBAXIN) 500 MG tablet Take 1 tablet (500 mg total) by mouth every 6 (six) hours as needed for muscle spasms. (Patient not taking: Reported on 10/08/2021) 30 tablet 1   Multiple Vitamin (MULTIVITAMIN WITH MINERALS) TABS tablet Take 1 tablet by mouth daily. (Patient not taking: Reported on 10/08/2021)     Naproxen Sodium (ALEVE PO) Take by mouth.     pantoprazole (PROTONIX) 40 MG tablet Take 1 tablet (40 mg total) by mouth daily for 14 days. (Patient not taking: Reported on 10/08/2021) 14 tablet 0   polyethylene glycol (MIRALAX / GLYCOLAX) 17 g packet Take 17 g by mouth daily. (Patient not taking: Reported on 10/08/2021) 14 each 0   traMADol (ULTRAM) 50 MG tablet Take 50 mg by mouth every 6 (six) hours as needed.     No facility-administered medications prior to visit.     Social History   Socioeconomic History   Marital status: Married    Spouse name: Not on file   Number of children: Not on file   Years of education: Not on file   Highest education level: Not on file  Occupational History   Occupation: truck driver  Tobacco Use   Smoking status: Never   Smokeless tobacco: Never  Vaping Use   Vaping status: Never Used  Substance and Sexual Activity   Alcohol use: Not Currently   Drug use: Never   Sexual activity: Not Currently  Other Topics Concern   Not on file  Social History Narrative   Not on file   Social Drivers of Health   Financial Resource Strain: Not on file   Food Insecurity: Not on file  Transportation Needs: Not on file  Physical Activity: Not on file  Stress: Not on file  Social Connections: Not on file  Intimate Partner Violence: Not on file      Review of Systems    All other ros negative  Objective:    There were no vitals taken for this visit. Nursing note and vital signs reviewed.  Physical Exam     General/constitutional: no distress, pleasant; walking with a cane HEENT: Normocephalic, PER, Conj Clear, EOMI, Oropharynx clear Neck supple CV: rrr no mrg Lungs: clear to auscultation, normal respiratory effort Abd: Soft, Nontender Ext: no edema Skin: No Rash Neuro: nonfocal     Labs: Lab Results  Component Value Date   WBC 6.4 04/01/2023   HGB 15.7  04/01/2023   HCT 47.4 04/01/2023   MCV 88.3 04/01/2023   PLT 254 04/01/2023   Last metabolic panel Lab Results  Component Value Date   GLUCOSE 100 (H) 04/01/2023   NA 141 04/01/2023   K 4.4 04/01/2023   CL 107 04/01/2023   CO2 26 04/01/2023   BUN 13 04/01/2023   CREATININE 1.19 04/01/2023   GFRNONAA >60 07/29/2021   CALCIUM 9.4 04/01/2023   PHOS 2.9 06/25/2021   PROT 6.7 04/01/2023   ALBUMIN 2.7 (L) 07/21/2021   BILITOT 0.3 04/01/2023   ALKPHOS 65 07/21/2021   AST 29 04/01/2023   ALT 41 04/01/2023   ANIONGAP 6 07/29/2021   Crp: 03/31/22   6.4 11/2021    4.1 10/08/21     7 (normal) 05/25/21    82  Micro:  Serology:  Imaging:  Assessment & Plan:   Problem List Items Addressed This Visit   None Visit Diagnoses       Hardware complicating wound infection, subsequent encounter    -  Primary   Relevant Orders   C-reactive protein   CBC   COMPLETE METABOLIC PANEL WITH GFR     Vertebral osteomyelitis (HCC)             Abx:  Cefadroxil since 08/2021 - 03/03/2023  63 yo male initially (05/16/21) treated for ecoli bacteremia and t8-9 verteral osteomyelitis with cefazolin --> ceftriaxone (due to severe n/v) on 4/01 then maintained on  cefadroxil, but increased pain/progressive ct erosion of t8-9 readmitted 5/10-6/1 underwent t8-9 corpectomy with t5-11 spinal fusion on 07/17/21  He was restarted on ceftriaxone which will finish roughly another 8 weeks   Given hardware presence will continue cefadroxil for now and then potentially amoxicillin for a more narrowed spectrum abx use. Plan 3 months abx and monitor off as his hardware was placed at end of infection treatment  12/10/21 id assessment Doing well minimal sx and no abx side effect Discuss data for hardware associated infection extrapolated from pji data I would go at least 6-12 months out before feeling comfortable considering stopping abx given this is spine infection and also how severely affected he was and no side effect from abx  I would consider reasonable in 3 more months to go down to 500 mg po bid and at a year potentially 1 tablet daily for chronic suppression if his issue is pill burden   -crp today -f/u 3 months   03/31/22 id assessment Doing very well. Has been 7 months with normal crp and continued pain improvement. I think it is reasonable to go down to 500 mg cefadroxil bid He asks about when he could go off meds completely or one tablet once a day Crp today  09/29/22 id clinic assessment A year into medication now (14 months) since I&D and hardware placement Walking with cane now instead of wheel chair Taking 1 tramadol on average a day as need for pain Wants to remain on suppressive therapy but 1 tablet once a day   -labs today -f/u 6 months or sooner as needed if sx of concern on back infection   04/01/23 id clinic assessment Off cefadroxil almost a month Back doing well only intermittent pain Has dental pain yet to see dentist -- he asked if need to take any abx to prevent back infection. I discussed it might/might not work with abx (need to have dental exam make sure nothing serious in terms of infection, and that abx might/might not work  in preventing  back infection if severe oral infection spread systemically)  With regard to cefadroxil he wants to continue staying off and see   Labs today F/u 6-8 weeks with me   04/23/23 id assessment    Component Value Date/Time   CRP 17.6 (H) 04/01/2023 1123   CRP 7.8 09/29/2022 1358   CRP 6.4 03/31/2022 1213   Patient coming back early for elevated crp; I suspect due to dental issue Recurrent back pain like when he had that initial infection No fever, chill Hasn't seen dental yet     Labs today If crp increased will start abx/get mri If crp improves but pain worsen will get mri first F/u 4-6 weeks   Follow-up: Return in about 4 weeks (around 05/21/2023).    Raymondo Band, MD [facility] [phone]  pager   [phone] cell 04/23/2023, 9:43 AM

## 2023-04-24 LAB — COMPLETE METABOLIC PANEL WITH GFR
AG Ratio: 1.4 (calc) (ref 1.0–2.5)
ALT: 29 U/L (ref 9–46)
AST: 22 U/L (ref 10–35)
Albumin: 3.9 g/dL (ref 3.6–5.1)
Alkaline phosphatase (APISO): 71 U/L (ref 35–144)
BUN: 16 mg/dL (ref 7–25)
CO2: 28 mmol/L (ref 20–32)
Calcium: 9.3 mg/dL (ref 8.6–10.3)
Chloride: 104 mmol/L (ref 98–110)
Creat: 1.21 mg/dL (ref 0.70–1.35)
Globulin: 2.8 g/dL (ref 1.9–3.7)
Glucose, Bld: 155 mg/dL — ABNORMAL HIGH (ref 65–99)
Potassium: 4.4 mmol/L (ref 3.5–5.3)
Sodium: 141 mmol/L (ref 135–146)
Total Bilirubin: 0.4 mg/dL (ref 0.2–1.2)
Total Protein: 6.7 g/dL (ref 6.1–8.1)
eGFR: 69 mL/min/{1.73_m2} (ref >=60)

## 2023-04-24 LAB — CBC
HCT: 46.7 % (ref 38.5–50.0)
Hemoglobin: 15 g/dL (ref 13.2–17.1)
MCH: 29.2 pg (ref 27.0–33.0)
MCHC: 32.1 g/dL (ref 32.0–36.0)
MCV: 90.9 fL (ref 80.0–100.0)
MPV: 12.2 fL (ref 7.5–12.5)
Platelets: 250 10*3/uL (ref 140–400)
RBC: 5.14 10*6/uL (ref 4.20–5.80)
RDW: 13.1 % (ref 11.0–15.0)
WBC: 7.5 10*3/uL (ref 3.8–10.8)

## 2023-04-24 LAB — C-REACTIVE PROTEIN: CRP: 13.1 mg/L — ABNORMAL HIGH (ref <8.0)

## 2023-04-26 ENCOUNTER — Telehealth: Payer: Self-pay

## 2023-04-26 NOTE — Telephone Encounter (Signed)
 Patient aware. Patient stated that he is doing okay. Aware to contact our office if sx's worsen.   Harolyn Cocker Lesli Albee, CMA

## 2023-04-26 NOTE — Telephone Encounter (Signed)
-----   Message from [place] sent at 04/26/2023  9:06 AM EST ----- Hi team could you please relay below to him  Thanks    ------- Hi Michael [name]  Your number seems to be better  We can keep watching for another week or so and if pain persists and progressively worse we can get MRI  Please let me know  Thanks Dr Renold Don

## 2023-05-20 ENCOUNTER — Other Ambulatory Visit: Payer: Self-pay

## 2023-05-20 ENCOUNTER — Encounter: Payer: Self-pay | Admitting: Internal Medicine

## 2023-05-20 ENCOUNTER — Ambulatory Visit: Payer: 59 | Admitting: Internal Medicine

## 2023-05-20 VITALS — BP 155/89 | HR 81 | Temp 98.5°F | Ht 72.0 in | Wt 378.0 lb

## 2023-05-20 DIAGNOSIS — T847XXD Infection and inflammatory reaction due to other internal orthopedic prosthetic devices, implants and grafts, subsequent encounter: Secondary | ICD-10-CM | POA: Diagnosis not present

## 2023-05-20 DIAGNOSIS — M4624 Osteomyelitis of vertebra, thoracic region: Secondary | ICD-10-CM | POA: Diagnosis not present

## 2023-05-20 NOTE — Patient Instructions (Addendum)
 Labs today  See me in 3 months (let me know if your back is much worse or with fever/chill)

## 2023-05-20 NOTE — Progress Notes (Signed)
 [facility]  Patient Active Problem List   Diagnosis Date Noted   Osteomyelitis of thoracic region (HCC) 07/09/2021   Nausea and vomiting    Constipation    Pressure injury of skin 06/23/2021   Thoracic discitis 06/12/2021   Class 1 obesity due to excess calories with body mass index (BMI) of 34.0 to 34.9 in adult 06/12/2021   Oral thrush 06/12/2021   Poor dentition 06/12/2021   Gram-negative bacteremia 05/17/2021   Essential hypertension 05/16/2021   Obesity, Class III, BMI 40-49.9 (morbid obesity) (HCC) 05/16/2021      Subjective:    Patient ID: Michael [name], male    DOB: 1960-11-06, 63 y.o.   MRN: [id number]  Chief Complaint  Patient presents with   Follow-up    HPI:  Michael [name] is a 63 y.o. male here for hospital f/u ecoli bacteremia/t8-9 osteo  He is here earlier than advised due to severe nausea/vomiting Hasn't been able to keep anything down, feels tired Zofran not helping Reports while in hospital iv phenergan help  Didn't want to try suppository phenergan  Back pain moderate-severe not helped much with hydrocodone No f/c Picc site no issue  ----------- 06/04/21 id f/u Due to severe nausea we had agreed to switch to ceftriaxone on 4/03 He still have severe n/v. His last bun/cr on 3/30 was 18/0.93 No f/c He feels weak/tired No chest pain/sob Intermittent cough No arms pain/numbness  He was seen in ed 3/30 for spurious lab of k in the 6's but repeat testing there was 3.2. he was given 1 liter normal saline and iv zofran and discharged  No headache Passing flatus and bowel movement -- not diarrhea    He is currently only taking phenergan   09/03/21 id clinic f/u Patient was recently admitted to [facility] 5/10-6/01. Reviewed chart He was admitted for increased lower back pain with ct imaging showing progressive erosion of 58-9. He subsequently underwent t8-9 corpectomy and t5-11 fusion. Bcx negative; no  sepsis. 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 Of note his crp was rising just prior to this admission His cefadroxil was stopped during that admission and he was restarted on ceftriaxone with end date 09/04/21 He was discharged to [facility]. His picc came out 3 cm on 2023-09-14 and was removed. He last received ceftriaxone on 09/14/23 His father passed away father's day No fever, chill No n/v/diarrhea   He is wearing tlso brace when out of bed  Pain is tolerable -- he is off narcotics...  No new weakness/numbness/tingling in his legs No urinary incontinence   Walking about 500 ft. Standup on his own Eating well   12/10/21 id clinic f/u Some pain Not doing pt/ot due to cost He wonders when he can stop abx No abx side effect Tingling and legs sleeping at times when he stands up and he doesn't feel safe  He no longer is wearing tlso brace at this point  03/31/22 id clinic f/u Patient doing well. Walking now. Last visit was in wheelchair We reviewed his labs again; crp has been normal since 10/08/21. He has been on abx for 9 months now since initial infection and lumbar surgery 07/17/2021 No abx side effect He is taking cefadroxil 1 gram twice a day Mild-moderate pain occasionally laying the wrong way; not taking any pain meds. Normally doesn't have pain any more   04/01/23 clinic  f/u Ran out of cefadroxil for 3-4 weeks and didn't try to get it so stopped taking Feels well in terms of back No fever, chill, malaise, decreased appetite Feels he wants to stay off antibiotics  05/20/23 id clinic f/u See a&p for detail   No Known Allergies    Outpatient Medications Prior to Visit  Medication Sig Dispense Refill   Naproxen Sodium (ALEVE PO) Take by mouth.     traMADol (ULTRAM) 50 MG tablet Take 50 mg by mouth every 6 (six) hours as needed.     acetaminophen (TYLENOL) 500 MG tablet Take 2 tablets (1,000 mg total) by mouth every  6 (six) hours. (Patient not taking: Reported on 10/08/2021) 30 tablet 0   bisacodyl (DULCOLAX) 10 MG suppository Place 1 suppository (10 mg total) rectally as needed for moderate constipation. (Patient not taking: Reported on 10/08/2021) 12 suppository 0   feeding supplement (ENSURE ENLIVE / ENSURE PLUS) LIQD Take 237 mLs by mouth 3 (three) times daily between meals. (Patient not taking: Reported on 10/08/2021) 237 mL 12   gabapentin (NEURONTIN) 300 MG capsule Take 1 capsule (300 mg total) by mouth 3 (three) times daily. (Patient not taking: Reported on 10/08/2021) 60 capsule 0   lisinopril-hydrochlorothiazide (ZESTORETIC) 10-12.5 MG tablet Take 0.5 tablets by mouth daily. (Patient not taking: Reported on 03/31/2022)     methocarbamol (ROBAXIN) 500 MG tablet Take 1 tablet (500 mg total) by mouth every 6 (six) hours as needed for muscle spasms. (Patient not taking: Reported on 10/08/2021) 30 tablet 1   Multiple Vitamin (MULTIVITAMIN WITH MINERALS) TABS tablet Take 1 tablet by mouth daily. (Patient not taking: Reported on 05/20/2023)     pantoprazole (PROTONIX) 40 MG tablet Take 1 tablet (40 mg total) by mouth daily for 14 days. (Patient not taking: Reported on 10/08/2021) 14 tablet 0   polyethylene glycol (MIRALAX / GLYCOLAX) 17 g packet Take 17 g by mouth daily. (Patient not taking: Reported on 10/08/2021) 14 each 0   No facility-administered medications prior to visit.     Social History   Socioeconomic History   Marital status: Married    Spouse name: Not on file   Number of children: Not on file   Years of education: Not on file   Highest education level: Not on file  Occupational History   Occupation: truck driver  Tobacco Use   Smoking status: Never   Smokeless tobacco: Never  Vaping Use   Vaping status: Never Used  Substance and Sexual Activity   Alcohol use: Not Currently   Drug use: Never   Sexual activity: Not Currently  Other Topics Concern   Not on file  Social History Narrative   Not  on file   Social Drivers of Health   Financial Resource Strain: Not on file  Food Insecurity: Not on file  Transportation Needs: Not on file  Physical Activity: Not on file  Stress: Not on file  Social Connections: Not on file  Intimate Partner Violence: Not on file      Review of Systems    All other ros negative  Objective:    BP (!) 155/89   Pulse 81   Temp 98.5 F (36.9 C) (Oral)   Ht 6' (1.829 m)   Wt (!) 378 lb (171.5 kg)   SpO2 94%   BMI 51.27 kg/m  Nursing note and vital signs reviewed.  Physical Exam     General/constitutional: no distress, pleasant; walking with a cane HEENT: Normocephalic, PER, Conj Clear,  EOMI, Oropharynx clear Neck supple CV: rrr no mrg Lungs: clear to auscultation, normal respiratory effort Abd: Soft, Nontender Ext: no edema Skin: No Rash Neuro: nonfocal     Labs: Lab Results  Component Value Date   WBC 7.5 04/23/2023   HGB 15.0 04/23/2023   HCT 46.7 04/23/2023   MCV 90.9 04/23/2023   PLT 250 04/23/2023   Last metabolic panel Lab Results  Component Value Date   GLUCOSE 155 (H) 04/23/2023   NA 141 04/23/2023   K 4.4 04/23/2023   CL 104 04/23/2023   CO2 28 04/23/2023   BUN 16 04/23/2023   CREATININE 1.21 04/23/2023   GFRNONAA >60 07/29/2021   CALCIUM 9.3 04/23/2023   PHOS 2.9 06/25/2021   PROT 6.7 04/23/2023   ALBUMIN 2.7 (L) 07/21/2021   BILITOT 0.4 04/23/2023   ALKPHOS 65 07/21/2021   AST 22 04/23/2023   ALT 29 04/23/2023   ANIONGAP 6 07/29/2021   Crp:    Component Value Date/Time   CRP 13.1 (H) 04/23/2023 1007   CRP 17.6 (H) 04/01/2023 1123   CRP 7.8 09/29/2022 1358    03/31/22   6.4 11/2021    4.1 10/08/21     7 (normal) 05/25/21    82  Micro:  Serology:  Imaging:  Assessment & Plan:   Problem List Items Addressed This Visit   None Visit Diagnoses       Hardware complicating wound infection, subsequent encounter    -  Primary   Relevant Orders   C-reactive protein     Acute  osteomyelitis of thoracic spine (HCC)       Relevant Orders   C-reactive protein          Abx:  Cefadroxil since 08/2021 - 03/03/2023  63 yo male initially (05/16/21) treated for ecoli bacteremia and t8-9 verteral osteomyelitis with cefazolin --> ceftriaxone (due to severe n/v) on 4/01 then maintained on cefadroxil, but increased pain/progressive ct erosion of t8-9 readmitted 5/10-6/1 underwent t8-9 corpectomy with t5-11 spinal fusion on 07/17/21  He was restarted on ceftriaxone which will finish roughly another 8 weeks   Given hardware presence will continue cefadroxil for now and then potentially amoxicillin for a more narrowed spectrum abx use. Plan 3 months abx and monitor off as his hardware was placed at end of infection treatment  12/10/21 id assessment Doing well minimal sx and no abx side effect Discuss data for hardware associated infection extrapolated from pji data I would go at least 6-12 months out before feeling comfortable considering stopping abx given this is spine infection and also how severely affected he was and no side effect from abx  I would consider reasonable in 3 more months to go down to 500 mg po bid and at a year potentially 1 tablet daily for chronic suppression if his issue is pill burden   -crp today -f/u 3 months   03/31/22 id assessment Doing very well. Has been 7 months with normal crp and continued pain improvement. I think it is reasonable to go down to 500 mg cefadroxil bid He asks about when he could go off meds completely or one tablet once a day Crp today  09/29/22 id clinic assessment A year into medication now (14 months) since I&D and hardware placement Walking with cane now instead of wheel chair Taking 1 tramadol on average a day as need for pain Wants to remain on suppressive therapy but 1 tablet once a day   -labs today -f/u 6 months  or sooner as needed if sx of concern on back infection   04/01/23 id clinic assessment Off  cefadroxil almost a month Back doing well only intermittent pain Has dental pain yet to see dentist -- he asked if need to take any abx to prevent back infection. I discussed it might/might not work with abx (need to have dental exam make sure nothing serious in terms of infection, and that abx might/might not work in preventing back infection if severe oral infection spread systemically)  With regard to cefadroxil he wants to continue staying off and see   Labs today F/u 6-8 weeks with me   04/23/23 id assessment    Component Value Date/Time   CRP 13.1 (H) 04/23/2023 1007   CRP 17.6 (H) 04/01/2023 1123   CRP 7.8 09/29/2022 1358   Patient coming back early for elevated crp; I suspect due to dental issue Recurrent back pain like when he had that initial infection No fever, chill Hasn't seen dental yet     Labs today If crp increased will start abx/get mri If crp improves but pain worsen will get mri first F/u 4-6 weeks    05/20/23 id clinic assessment Doing well off abx Intermittent lower back pain and right buttock pain; better with movement; improved with advil No malaise, appetite good Walks with a cane Patient is 375 pounds, gained a lot of weight the last year     Follow-up: Return in about 3 months (around 08/20/2023).    Raymondo Band, MD [facility] [phone]  pager   [phone] cell 05/20/2023, 11:25 AM

## 2023-05-21 LAB — C-REACTIVE PROTEIN: CRP: 8.3 mg/L — ABNORMAL HIGH (ref <8.0)

## 2023-08-19 ENCOUNTER — Encounter: Payer: Self-pay | Admitting: Internal Medicine

## 2023-08-19 ENCOUNTER — Other Ambulatory Visit: Payer: Self-pay

## 2023-08-19 ENCOUNTER — Ambulatory Visit: Admitting: Internal Medicine

## 2023-08-19 VITALS — BP 143/84 | HR 72 | Resp 16 | Ht 72.0 in | Wt 377.6 lb

## 2023-08-19 DIAGNOSIS — M869 Osteomyelitis, unspecified: Secondary | ICD-10-CM | POA: Diagnosis not present

## 2023-08-19 DIAGNOSIS — T847XXD Infection and inflammatory reaction due to other internal orthopedic prosthetic devices, implants and grafts, subsequent encounter: Secondary | ICD-10-CM | POA: Diagnosis not present

## 2023-08-19 DIAGNOSIS — B9689 Other specified bacterial agents as the cause of diseases classified elsewhere: Secondary | ICD-10-CM | POA: Diagnosis not present

## 2023-08-19 NOTE — Progress Notes (Signed)
 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   MRN: [id number]  Chief Complaint  Patient presents with   Follow-up    HPI:  Michael [name] is a 63 y.o. male here for hospital f/u ecoli bacteremia/t8-9 osteo  He is here earlier than advised due to severe nausea/vomiting Hasn't been able to keep anything down, feels tired Zofran  not helping Reports while in hospital iv phenergan  help  Didn't want to try suppository phenergan   Back pain moderate-severe not helped much with hydrocodone  No f/c Picc site no issue  ----------- 06/04/21 id f/u Due to severe nausea we had agreed to switch to ceftriaxone  on 4/03 He still have severe n/v. His last bun/cr on 3/30 was 18/0.93 No f/c He feels weak/tired No chest pain/sob Intermittent cough No arms pain/numbness  He was seen in ed 3/30 for spurious lab of k in the 6's but repeat testing there was 3.2. he was given 1 liter normal saline and iv zofran  and discharged  No headache Passing flatus and bowel movement -- not diarrhea    He is currently only taking phenergan    09/03/21 id clinic f/u Patient was recently admitted to [facility] 5/10-6/01. Reviewed chart He was admitted for increased lower back pain with ct imaging showing progressive erosion of 58-9. He subsequently underwent t8-9 corpectomy and t5-11 fusion. Bcx negative; no sepsis.  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 Of note his crp was rising just prior to this admission His cefadroxil  was stopped during that admission and he was restarted on ceftriaxone  with end date 09/04/21 He was discharged to [facility] [place] [facility]. His picc came out 3 cm on 09-28-2023 and was removed. He last received ceftriaxone  on 09-28-23 His father passed away father's day No fever, chill No n/v/diarrhea   He is wearing tlso brace when out of bed  Pain is tolerable -- he is off narcotics...  No new weakness/numbness/tingling in his legs No urinary incontinence   Walking about 500 ft. Standup on his own Eating well   12/10/21 id clinic f/u Some pain Not doing pt/ot due to cost He wonders when he can stop abx No abx side effect Tingling and legs sleeping at times when he stands up and he doesn't feel safe  He no longer is wearing tlso brace at this point  03/31/22 id clinic f/u Patient doing well. Walking now. Last visit was in wheelchair We reviewed his labs again; crp has been normal since 10/08/21. He has been on abx for 9 months now since initial infection and lumbar surgery 07/17/2021 No abx side effect He is taking cefadroxil  1 gram twice a day Mild-moderate pain occasionally laying the wrong way; not taking any pain meds. Normally doesn't have pain any more   04/01/23 clinic f/u  Ran out of cefadroxil  for 3-4 weeks and didn't try to get it so stopped taking Feels well in terms of back No fever, chill, malaise, decreased appetite Feels he wants to stay off antibiotics   08/19/23 id clinic f/u Off abx still See a&p for detail   No Known Allergies    Outpatient Medications Prior to Visit  Medication Sig Dispense Refill   acetaminophen  (TYLENOL ) 500 MG tablet Take 2 tablets (1,000 mg total) by mouth every 6 (six) hours. 30 tablet 0   Naproxen Sodium (ALEVE PO) Take by mouth.     bisacodyl  (DULCOLAX) 10 MG suppository Place 1  suppository (10 mg total) rectally as needed for moderate constipation. (Patient not taking: Reported on 08/19/2023) 12 suppository 0   feeding supplement (ENSURE ENLIVE / ENSURE PLUS) LIQD Take 237 mLs by mouth 3 (three) times daily between meals. (Patient not taking: Reported on 08/19/2023) 237 mL 12   gabapentin  (NEURONTIN ) 300 MG capsule Take 1 capsule (300 mg total) by mouth 3 (three) times daily. (Patient not taking: Reported on 08/19/2023) 60 capsule 0   lisinopril -hydrochlorothiazide  (ZESTORETIC ) 10-12.5 MG tablet Take 0.5 tablets by mouth daily. (Patient not taking: Reported on 08/19/2023)     methocarbamol  (ROBAXIN ) 500 MG tablet Take 1 tablet (500 mg total) by mouth every 6 (six) hours as needed for muscle spasms. (Patient not taking: Reported on 10/08/2021) 30 tablet 1   Multiple Vitamin (MULTIVITAMIN WITH MINERALS) TABS tablet Take 1 tablet by mouth daily. (Patient not taking: Reported on 08/19/2023)     pantoprazole  (PROTONIX ) 40 MG tablet Take 1 tablet (40 mg total) by mouth daily for 14 days. (Patient not taking: Reported on 08/19/2023) 14 tablet 0   polyethylene glycol (MIRALAX  / GLYCOLAX ) 17 g packet Take 17 g by mouth daily. (Patient not taking: Reported on 08/19/2023) 14 each 0   traMADol  (ULTRAM ) 50 MG tablet Take 50 mg by mouth every 6 (six) hours as needed. (Patient not taking: Reported on 08/19/2023)     No facility-administered medications prior to visit.     Social History   Socioeconomic History   Marital status: Married    Spouse name: Not on file   Number of children: Not on file   Years of education: Not on file   Highest education level: Not on file  Occupational History   Occupation: truck driver  Tobacco Use   Smoking status: Never   Smokeless tobacco: Never  Vaping Use   Vaping status: Never Used  Substance and Sexual Activity   Alcohol use: Not Currently   Drug use: Never   Sexual activity: Not Currently  Other Topics Concern   Not on file  Social History  Narrative   Not on file   Social Drivers of Health   Financial Resource Strain: Not on file  Food Insecurity: Not on file  Transportation Needs: Not on file  Physical Activity: Not on file  Stress: Not on file  Social Connections: Not on file  Intimate Partner Violence: Not on file      Review of Systems    All other ros negative  Objective:    BP (!) 143/84   Pulse 72   Resp 16   Ht 6' (1.829 m)   Wt (!) 377 lb 9.6 oz (171.3 kg)   SpO2 95%   BMI 51.21 kg/m  Nursing note and vital signs reviewed.  Physical Exam     General/constitutional: obese; no distress, pleasant; walking with a cane HEENT: Normocephalic, PER,  Conj Clear, EOMI, Oropharynx clear Neck supple CV: rrr no mrg Lungs: clear to auscultation, normal respiratory effort Abd: Soft, Nontender Ext: no edema Skin: No Rash Neuro: nonfocal     Labs: Lab Results  Component Value Date   WBC 7.5 04/23/2023   HGB 15.0 04/23/2023   HCT 46.7 04/23/2023   MCV 90.9 04/23/2023   PLT 250 04/23/2023   Last metabolic panel Lab Results  Component Value Date   GLUCOSE 155 (H) 04/23/2023   NA 141 04/23/2023   K 4.4 04/23/2023   CL 104 04/23/2023   CO2 28 04/23/2023   BUN 16 04/23/2023   CREATININE 1.21 04/23/2023   GFRNONAA >60 07/29/2021   CALCIUM 9.3 04/23/2023   PHOS 2.9 06/25/2021   PROT 6.7 04/23/2023   ALBUMIN  2.7 (L) 07/21/2021   BILITOT 0.4 04/23/2023   ALKPHOS 65 07/21/2021   AST 22 04/23/2023   ALT 29 04/23/2023   ANIONGAP 6 07/29/2021   Crp:    Component Value Date/Time   CRP 8.3 (H) 05/20/2023 1143   CRP 13.1 (H) 04/23/2023 1007   CRP 17.6 (H) 04/01/2023 1123    03/31/22   6.4 11/2021    4.1 10/08/21     7 (normal) 05/25/21    82  Micro:  Serology:  Imaging:  Assessment & Plan:   Problem List Items Addressed This Visit   None Visit Diagnoses       Hardware complicating wound infection, subsequent encounter    -  Primary   Relevant Orders   CBC   COMPLETE  METABOLIC PANEL WITHOUT GFR   Sedimentation rate   C-reactive protein     Osteomyelitis, unspecified site, unspecified type (HCC)               Abx:  Cefadroxil  since 08/2021 - 03/03/2023  63 yo male initially (05/16/21) treated for ecoli bacteremia and 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 Plan 3 months abx and monitor off as his hardware was placed at end of infection treatment  12/10/21 id assessment Doing well minimal sx and no abx side effect Discuss data for hardware associated infection extrapolated from pji data I would go at least 6-12 months out before feeling comfortable considering stopping abx given this is spine infection and also how severely affected he was and no side effect from abx  I would consider reasonable in 3 more months to go down to 500 mg po bid and at a year potentially 1 tablet daily for chronic suppression if his issue is pill burden   -crp today -f/u 3 months   03/31/22 id assessment Doing very well. Has been 7 months with normal crp and continued pain improvement. I think it is reasonable to go down to 500 mg cefadroxil  bid He asks about when he could go off meds completely or one tablet once a day Crp today  09/29/22 id clinic assessment A year into medication now (14 months) since I&D and hardware placement Walking with cane now instead of wheel chair Taking 1 tramadol  on average a day as need for pain Wants to remain on suppressive therapy but 1 tablet  once a day   -labs today -f/u 6 months or sooner as needed if sx of concern on  back infection   04/01/23 id clinic assessment Off cefadroxil  almost a month Back doing well only intermittent pain Has dental pain yet to see dentist -- he asked if need to take any abx to prevent back infection. I discussed it might/might not work with abx (need to have dental exam make sure nothing serious in terms of infection, and that abx might/might not work in preventing back infection if severe oral infection spread systemically)  With regard to cefadroxil  he wants to continue staying off and see   Labs today F/u 6-8 weeks with me   04/23/23 id assessment    Component Value Date/Time   CRP 8.3 (H) 05/20/2023 1143   CRP 13.1 (H) 04/23/2023 1007   CRP 17.6 (H) 04/01/2023 1123   Patient coming back early for elevated crp; I suspect due to dental issue Recurrent back pain like when he had that initial infection No fever, chill Hasn't seen dental yet     Labs today If crp increased will start abx/get mri If crp improves but pain worsen will get mri first F/u 4-6 weeks    05/20/23 id clinic assessment Doing well off abx Intermittent lower back pain and right buttock pain; better with movement; improved with advil No malaise, appetite good Walks with a cane Patient is 375 pounds, gained a lot of weight the last year  08/19/23 id clinic assessment Off abx still No change in intermittent lower back pain No fever, chill, malaise    Component Value Date/Time   CRP 8.3 (H) 05/20/2023 1143   CRP 13.1 (H) 04/23/2023 1007   CRP 17.6 (H) 04/01/2023 1123   Crp improving off abx  Repeat labs again today, and plan another visit in around 6-8 weeks to do antoher -- if continued improvement then will discharge from clinic     Follow-up: No follow-ups on file.    Jamesetta Mcbride, MD [facility] [phone]  pager   [phone] cell 08/19/2023, 10:46 AM

## 2023-08-19 NOTE — Patient Instructions (Signed)
 I am glad you are doing ok off antibiotics   Labs today and revisit in 6-8 weeks for one more check before I let you go    thanks

## 2023-08-20 LAB — COMPLETE METABOLIC PANEL WITHOUT GFR
AG Ratio: 1.6 (calc) (ref 1.0–2.5)
ALT: 26 U/L (ref 9–46)
AST: 19 U/L (ref 10–35)
Albumin: 3.8 g/dL (ref 3.6–5.1)
Alkaline phosphatase (APISO): 63 U/L (ref 35–144)
BUN: 12 mg/dL (ref 7–25)
CO2: 27 mmol/L (ref 20–32)
Calcium: 9 mg/dL (ref 8.6–10.3)
Chloride: 106 mmol/L (ref 98–110)
Creat: 1.24 mg/dL (ref 0.70–1.35)
Globulin: 2.4 g/dL (ref 1.9–3.7)
Glucose, Bld: 128 mg/dL — ABNORMAL HIGH (ref 65–99)
Potassium: 4 mmol/L (ref 3.5–5.3)
Sodium: 141 mmol/L (ref 135–146)
Total Bilirubin: 0.7 mg/dL (ref 0.2–1.2)
Total Protein: 6.2 g/dL (ref 6.1–8.1)

## 2023-08-20 LAB — CBC
HCT: 47.4 % (ref 38.5–50.0)
Hemoglobin: 15 g/dL (ref 13.2–17.1)
MCH: 29.2 pg (ref 27.0–33.0)
MCHC: 31.6 g/dL — ABNORMAL LOW (ref 32.0–36.0)
MCV: 92.2 fL (ref 80.0–100.0)
MPV: 11.8 fL (ref 7.5–12.5)
Platelets: 232 10*3/uL (ref 140–400)
RBC: 5.14 10*6/uL (ref 4.20–5.80)
RDW: 13.4 % (ref 11.0–15.0)
WBC: 7.2 10*3/uL (ref 3.8–10.8)

## 2023-08-20 LAB — SEDIMENTATION RATE: Sed Rate: 9 mm/h (ref 0–20)

## 2023-08-20 LAB — C-REACTIVE PROTEIN: CRP: 12.1 mg/L — ABNORMAL HIGH (ref <8.0)

## 2023-08-23 ENCOUNTER — Ambulatory Visit: Payer: Self-pay | Admitting: Internal Medicine

## 2023-10-06 ENCOUNTER — Ambulatory Visit: Admitting: Internal Medicine
